# Patient Record
Sex: Female | Born: 1935 | Race: White | Hispanic: No | Smoking: Never smoker
Health system: Southern US, Community
[De-identification: ages and names within clinical notes are randomized; demographics above are authoritative.]

## PROBLEM LIST (undated history)

## (undated) DIAGNOSIS — I10 Essential (primary) hypertension: Secondary | ICD-10-CM

## (undated) DIAGNOSIS — I2510 Atherosclerotic heart disease of native coronary artery without angina pectoris: Secondary | ICD-10-CM

## (undated) DIAGNOSIS — M810 Age-related osteoporosis without current pathological fracture: Secondary | ICD-10-CM

## (undated) DIAGNOSIS — K219 Gastro-esophageal reflux disease without esophagitis: Secondary | ICD-10-CM

## (undated) DIAGNOSIS — I2699 Other pulmonary embolism without acute cor pulmonale: Secondary | ICD-10-CM

## (undated) DIAGNOSIS — E785 Hyperlipidemia, unspecified: Secondary | ICD-10-CM

## (undated) DIAGNOSIS — I4891 Unspecified atrial fibrillation: Secondary | ICD-10-CM

## (undated) DIAGNOSIS — I509 Heart failure, unspecified: Secondary | ICD-10-CM

## (undated) DIAGNOSIS — M109 Gout, unspecified: Secondary | ICD-10-CM

## (undated) DIAGNOSIS — I6529 Occlusion and stenosis of unspecified carotid artery: Secondary | ICD-10-CM

## (undated) DIAGNOSIS — K5790 Diverticulosis of intestine, part unspecified, without perforation or abscess without bleeding: Secondary | ICD-10-CM

## (undated) DIAGNOSIS — K449 Diaphragmatic hernia without obstruction or gangrene: Secondary | ICD-10-CM

## (undated) DIAGNOSIS — D649 Anemia, unspecified: Secondary | ICD-10-CM

## (undated) DIAGNOSIS — N189 Chronic kidney disease, unspecified: Secondary | ICD-10-CM

## (undated) HISTORY — DX: Diaphragmatic hernia without obstruction or gangrene: K44.9

## (undated) HISTORY — DX: Occlusion and stenosis of unspecified carotid artery: I65.29

## (undated) HISTORY — DX: Chronic kidney disease, unspecified: N18.9

## (undated) HISTORY — DX: Anemia, unspecified: D64.9

## (undated) HISTORY — PX: KNEE ARTHROSCOPY: SUR90

## (undated) HISTORY — DX: Other pulmonary embolism without acute cor pulmonale: I26.99

## (undated) HISTORY — PX: ABDOMINAL HYSTERECTOMY: SHX81

## (undated) HISTORY — DX: Hyperlipidemia, unspecified: E78.5

---

## 2003-11-14 ENCOUNTER — Other Ambulatory Visit: Payer: Self-pay

## 2004-02-16 ENCOUNTER — Ambulatory Visit: Payer: Self-pay | Admitting: General Practice

## 2004-03-01 ENCOUNTER — Ambulatory Visit: Payer: Self-pay | Admitting: Family Medicine

## 2004-03-23 ENCOUNTER — Ambulatory Visit: Payer: Self-pay | Admitting: General Practice

## 2004-03-30 ENCOUNTER — Ambulatory Visit: Payer: Self-pay | Admitting: General Practice

## 2004-09-20 ENCOUNTER — Ambulatory Visit: Payer: Self-pay | Admitting: Internal Medicine

## 2005-03-28 ENCOUNTER — Ambulatory Visit: Payer: Self-pay | Admitting: Internal Medicine

## 2006-04-25 ENCOUNTER — Ambulatory Visit: Payer: Self-pay | Admitting: Internal Medicine

## 2006-10-18 ENCOUNTER — Ambulatory Visit: Payer: Self-pay | Admitting: Ophthalmology

## 2006-10-25 ENCOUNTER — Ambulatory Visit: Payer: Self-pay | Admitting: Ophthalmology

## 2007-02-27 ENCOUNTER — Ambulatory Visit: Payer: Self-pay | Admitting: Ophthalmology

## 2007-03-19 ENCOUNTER — Ambulatory Visit: Payer: Self-pay | Admitting: Ophthalmology

## 2007-04-30 ENCOUNTER — Ambulatory Visit: Payer: Self-pay | Admitting: Internal Medicine

## 2008-05-05 ENCOUNTER — Ambulatory Visit: Payer: Self-pay | Admitting: Internal Medicine

## 2009-05-06 ENCOUNTER — Ambulatory Visit: Payer: Self-pay | Admitting: Internal Medicine

## 2009-05-09 HISTORY — PX: ANKLE SURGERY: SHX546

## 2010-04-06 ENCOUNTER — Inpatient Hospital Stay: Payer: Self-pay | Admitting: Specialist

## 2010-04-10 ENCOUNTER — Encounter: Payer: Self-pay | Admitting: Internal Medicine

## 2010-05-09 ENCOUNTER — Encounter: Payer: Self-pay | Admitting: Internal Medicine

## 2010-06-09 ENCOUNTER — Encounter: Payer: Self-pay | Admitting: Internal Medicine

## 2010-07-08 ENCOUNTER — Encounter: Payer: Self-pay | Admitting: Cardiothoracic Surgery

## 2010-08-05 ENCOUNTER — Encounter: Payer: Self-pay | Admitting: Nurse Practitioner

## 2010-08-08 ENCOUNTER — Encounter: Payer: Self-pay | Admitting: Nurse Practitioner

## 2010-08-08 ENCOUNTER — Encounter: Payer: Self-pay | Admitting: Cardiothoracic Surgery

## 2010-09-07 ENCOUNTER — Encounter: Payer: Self-pay | Admitting: Nurse Practitioner

## 2010-09-07 ENCOUNTER — Encounter: Payer: Self-pay | Admitting: Cardiothoracic Surgery

## 2010-10-08 ENCOUNTER — Encounter: Payer: Self-pay | Admitting: Nurse Practitioner

## 2010-10-20 ENCOUNTER — Ambulatory Visit: Payer: Self-pay | Admitting: Internal Medicine

## 2010-10-22 ENCOUNTER — Encounter: Payer: Self-pay | Admitting: Cardiothoracic Surgery

## 2010-11-23 ENCOUNTER — Ambulatory Visit: Payer: Self-pay | Admitting: Orthopaedic Surgery

## 2011-03-24 ENCOUNTER — Encounter: Payer: Self-pay | Admitting: Internal Medicine

## 2011-04-09 ENCOUNTER — Encounter: Payer: Self-pay | Admitting: Internal Medicine

## 2011-06-28 DIAGNOSIS — G8929 Other chronic pain: Secondary | ICD-10-CM | POA: Insufficient documentation

## 2013-10-22 DIAGNOSIS — M810 Age-related osteoporosis without current pathological fracture: Secondary | ICD-10-CM | POA: Insufficient documentation

## 2015-06-14 ENCOUNTER — Encounter: Payer: Self-pay | Admitting: Emergency Medicine

## 2015-06-14 ENCOUNTER — Emergency Department
Admission: EM | Admit: 2015-06-14 | Discharge: 2015-06-14 | Disposition: A | Discharge status: Home / Self Care | Payer: Medicare Other | Attending: Student | Admitting: Student

## 2015-06-14 ENCOUNTER — Emergency Department: Payer: Medicare Other

## 2015-06-14 DIAGNOSIS — R112 Nausea with vomiting, unspecified: Secondary | ICD-10-CM | POA: Diagnosis not present

## 2015-06-14 DIAGNOSIS — R197 Diarrhea, unspecified: Secondary | ICD-10-CM | POA: Diagnosis not present

## 2015-06-14 DIAGNOSIS — I10 Essential (primary) hypertension: Secondary | ICD-10-CM | POA: Insufficient documentation

## 2015-06-14 DIAGNOSIS — R140 Abdominal distension (gaseous): Secondary | ICD-10-CM | POA: Insufficient documentation

## 2015-06-14 DIAGNOSIS — N390 Urinary tract infection, site not specified: Secondary | ICD-10-CM | POA: Insufficient documentation

## 2015-06-14 HISTORY — DX: Gastro-esophageal reflux disease without esophagitis: K21.9

## 2015-06-14 HISTORY — DX: Atherosclerotic heart disease of native coronary artery without angina pectoris: I25.10

## 2015-06-14 HISTORY — DX: Essential (primary) hypertension: I10

## 2015-06-14 LAB — COMPREHENSIVE METABOLIC PANEL
ALBUMIN: 4.2 g/dL (ref 3.5–5.0)
ALK PHOS: 72 U/L (ref 38–126)
ALT: 21 U/L (ref 14–54)
ANION GAP: 12 (ref 5–15)
AST: 26 U/L (ref 15–41)
BUN: 22 mg/dL — AB (ref 6–20)
CALCIUM: 9.3 mg/dL (ref 8.9–10.3)
CO2: 28 mmol/L (ref 22–32)
Chloride: 103 mmol/L (ref 101–111)
Creatinine, Ser: 1.03 mg/dL — ABNORMAL HIGH (ref 0.44–1.00)
GFR calc Af Amer: 55 mL/min — ABNORMAL LOW (ref >60)
GFR calc non Af Amer: 47 mL/min — ABNORMAL LOW (ref >60)
GLUCOSE: 100 mg/dL — AB (ref 65–99)
Potassium: 3.6 mmol/L (ref 3.5–5.1)
SODIUM: 143 mmol/L (ref 135–145)
Total Bilirubin: 0.4 mg/dL (ref 0.3–1.2)
Total Protein: 7.7 g/dL (ref 6.5–8.1)

## 2015-06-14 LAB — CBC
HEMATOCRIT: 43.1 % (ref 35.0–47.0)
HEMOGLOBIN: 13.9 g/dL (ref 12.0–16.0)
MCH: 30.4 pg (ref 26.0–34.0)
MCHC: 32.3 g/dL (ref 32.0–36.0)
MCV: 94.1 fL (ref 80.0–100.0)
Platelets: 189 10*3/uL (ref 150–440)
RBC: 4.59 MIL/uL (ref 3.80–5.20)
RDW: 14.5 % (ref 11.5–14.5)
WBC: 14.4 10*3/uL — ABNORMAL HIGH (ref 3.6–11.0)

## 2015-06-14 LAB — URINALYSIS COMPLETE WITH MICROSCOPIC (ARMC ONLY)
Bilirubin Urine: NEGATIVE
Glucose, UA: NEGATIVE mg/dL
Ketones, ur: NEGATIVE mg/dL
Nitrite: NEGATIVE
PH: 7 (ref 5.0–8.0)
Protein, ur: NEGATIVE mg/dL
Specific Gravity, Urine: 1.019 (ref 1.005–1.030)

## 2015-06-14 LAB — LIPASE, BLOOD: Lipase: 23 U/L (ref 11–51)

## 2015-06-14 MED ORDER — CEPHALEXIN 500 MG PO CAPS: 500.0000 mg | ORAL_CAPSULE | Freq: Two times a day (BID) | ORAL | Status: DC

## 2015-06-14 MED ORDER — IOHEXOL 300 MG/ML  SOLN
100.0000 mL | Freq: Once | INTRAMUSCULAR | Status: AC | PRN
  Administered 2015-06-14: 100 mL via INTRAVENOUS

## 2015-06-14 MED ORDER — IOHEXOL 240 MG/ML SOLN
25.0000 mL | Freq: Once | INTRAMUSCULAR | Status: AC | PRN
  Administered 2015-06-14: 25 mL via ORAL

## 2015-06-14 MED ORDER — CEPHALEXIN 500 MG PO CAPS
500.0000 mg | ORAL_CAPSULE | Freq: Once | ORAL | Status: AC
  Administered 2015-06-14: 500 mg via ORAL
  Filled 2015-06-14: qty 1

## 2015-06-14 MED ORDER — ONDANSETRON HCL 4 MG/2ML IJ SOLN
INTRAMUSCULAR | Status: AC
  Administered 2015-06-14: 4 mg via INTRAVENOUS
  Filled 2015-06-14: qty 2

## 2015-06-14 MED ORDER — ONDANSETRON HCL 4 MG/2ML IJ SOLN
4.0000 mg | Freq: Once | INTRAMUSCULAR | Status: AC
  Administered 2015-06-14: 4 mg via INTRAVENOUS
  Filled 2015-06-14: qty 2

## 2015-06-14 MED ORDER — SODIUM CHLORIDE 0.9 % IV BOLUS (SEPSIS)
500.0000 mL | Freq: Once | INTRAVENOUS | Status: AC
  Administered 2015-06-14: 500 mL via INTRAVENOUS

## 2015-06-14 MED ORDER — CEPHALEXIN 500 MG PO CAPS
ORAL_CAPSULE | ORAL | Status: AC
  Filled 2015-06-14: qty 1

## 2015-06-14 NOTE — ED Notes (Signed)
Pt presents to ER via EMS from home with n/v/d that started around 1600.

## 2015-06-14 NOTE — ED Provider Notes (Signed)
[facility] Emergency Department Provider Note  ____________________________________________  Time seen: Approximately 7:17 PM  I have reviewed the triage vital signs and the nursing notes.   HISTORY  Chief Complaint Nausea; Emesis; and Diarrhea    HPI Allison Novak is a 82 y.o. female with history of GERD, hypertension, coronary artery disease who presents for evaluation of sudden onset nausea, vomiting, diarrhea this afternoon, symptoms constant since onset, currently moderate, no modifying factors. She is also complaining of a sense of abdominal distention though she denies pain. No fevers. No chest pain or difficulty breathing. No dysuria. Prior to today, she had been in her usual state of health.   Past Medical History  Diagnosis Date  . GERD (gastroesophageal reflux disease)   . Hypertension   . Coronary artery disease     There are no active problems to display for this patient.   History reviewed. No pertinent past surgical history.  No current outpatient prescriptions on file.  Allergies Macrodantin  History reviewed. No pertinent family history.  Social History Social History  Substance Use Topics  . Smoking status: Never Smoker   . Smokeless tobacco: None  . Alcohol Use: None    Review of Systems Constitutional: No fever/chills Eyes: No visual changes. ENT: No sore throat. Cardiovascular: Denies chest pain. Respiratory: Denies shortness of breath. Gastrointestinal: No abdominal pain.  + nausea, + vomiting.  + diarrhea.  No constipation. Genitourinary: Negative for dysuria. Musculoskeletal: Negative for back pain. Skin: Negative for rash. Neurological: Negative for headaches, focal weakness or numbness.  10-point ROS otherwise negative.  ____________________________________________   PHYSICAL EXAM:  VITAL SIGNS: ED Triage Vitals  Enc Vitals Group     BP 06/14/15 1816 179/84 mmHg     Pulse Rate 06/14/15 1816 90    Resp 06/14/15 1816 18     Temp 06/14/15 1816 98.4 F (36.9 C)     Temp Source 06/14/15 1816 Oral     SpO2 06/14/15 1816 93 %     Weight 06/14/15 1816 165 lb (74.844 kg)     Height 06/14/15 1816 5\' 2"  (1.575 m)     Head Cir --      Peak Flow --      Pain Score --      Pain Loc --      Pain Edu? --      Excl. in [place]? --     Constitutional: Alert and oriented. Well appearing and in no acute distress. Eyes: Conjunctivae are normal. PERRL. EOMI. Head: Atraumatic. Nose: No congestion/rhinnorhea. Mouth/Throat: Mucous membranes are moist.  Oropharynx non-erythematous. Neck: No stridor. Cardiovascular: Normal rate, regular rhythm. Grossly normal heart sounds.  Good peripheral circulation. Respiratory: Normal respiratory effort.  No retractions. Lungs CTAB. Gastrointestinal: Soft and nontender with mild distention and hypoactive bowel sounds. No CVA tenderness. Genitourinary: Deferred Musculoskeletal: No lower extremity tenderness nor edema.  No joint effusions. Neurologic:  Normal speech and language. No gross focal neurologic deficits are appreciated. Skin:  Skin is warm, dry and intact. No rash noted. Psychiatric: Mood and affect are normal. Speech and behavior are normal.  ____________________________________________   LABS (all labs ordered are listed, but only abnormal results are displayed)  Labs Reviewed  COMPREHENSIVE METABOLIC PANEL - Abnormal; Notable for the following:    Glucose, Bld 100 (*)    BUN 22 (*)    Creatinine, Ser 1.03 (*)    GFR calc non Af Amer 47 (*)    GFR calc Af Amer 55 (*)  All other components within normal limits  CBC - Abnormal; Notable for the following:    WBC 14.4 (*)    All other components within normal limits  URINALYSIS COMPLETEWITH MICROSCOPIC ([facility] ONLY) - Abnormal; Notable for the following:    Color, Urine AMBER (*)    APPearance TURBID (*)    Hgb urine dipstick 2+ (*)    Leukocytes, UA 3+ (*)    Bacteria, UA MANY (*)     Squamous Epithelial / LPF 0-5 (*)    All other components within normal limits  URINE CULTURE  LIPASE, BLOOD   ____________________________________________  EKG  ED ECG REPORT I, Joanne Gavel, the attending physician, personally viewed and interpreted this ECG.   Date: 06/14/2015  EKG Time: 18:24  Rate: 88  Rhythm: normal sinus rhythm  Axis: normal  Intervals:none  ST&T Change: No acute ST elevation. EKG unchanged from 04/06/2010.  ____________________________________________  RADIOLOGY  CT abdomen and pelvis IMPRESSION: Sigmoid diverticulosis. No evidence of bowel obstruction or inflammation. Normal appendix.  Moderate size hiatal hernia.  Probable small gallstones.  ____________________________________________   PROCEDURES  Procedure(s) performed: None  Critical Care performed: No  ____________________________________________   INITIAL IMPRESSION / ASSESSMENT AND PLAN / ED COURSE  Pertinent labs & imaging results that were available during my care of the patient were reviewed by me and considered in my medical decision making (see chart for details).  Allison Novak is a 82 y.o. female with history of GERD, hypertension, coronary artery disease who presents for evaluation of sudden onset nausea, vomiting, diarrhea this afternoon. On exam, she is nontoxic appearing and in no acute distress. I'll signs are stable, she is afebrile. She has soft abdomen with mild distention which is nontender. Plan for screening labs, CT of the abdomen and pelvis, urinalysis. We'll give IV fluids as well as antiemetics. Reassess for disposition.  ----------------------------------------- 8:53 PM on 06/14/2015 -----------------------------------------  Patient reports she feels much better at this time. She is tolerating by mouth intake. CT of the abdomen and pelvis shows no acute intra-abdominal or intra-pelvic process. Urinalysis is concerning for urinary tract infection.  We'll discharge with Keflex. Additionally, she is moderately hypertensive here with a systolic blood pressure in the 170s. I discussed with her that she will need to follow up with her primary care doctor for reevaluation and for BP re-check within the next 1-2 days. We discussed return precautions and she is comfortable with the discharge plan. DC home. ____________________________________________   FINAL CLINICAL IMPRESSION(S) / ED DIAGNOSES  Final diagnoses:  Nausea vomiting and diarrhea  UTI (lower urinary tract infection)      Joanne Gavel, MD 06/14/15 2054

## 2015-06-16 DIAGNOSIS — I700 Atherosclerosis of aorta: Secondary | ICD-10-CM | POA: Diagnosis present

## 2015-06-16 DIAGNOSIS — I10 Essential (primary) hypertension: Secondary | ICD-10-CM | POA: Diagnosis present

## 2015-06-16 DIAGNOSIS — K5730 Diverticulosis of large intestine without perforation or abscess without bleeding: Secondary | ICD-10-CM | POA: Insufficient documentation

## 2015-06-16 LAB — URINE CULTURE

## 2015-06-23 ENCOUNTER — Encounter: Payer: Self-pay | Admitting: Internal Medicine

## 2015-06-23 ENCOUNTER — Observation Stay
Admission: AD | Admit: 2015-06-23 | Discharge: 2015-06-24 | Disposition: A | Discharge status: Home / Self Care | Payer: Medicare Other | Source: Ambulatory Visit | Attending: Internal Medicine | Admitting: Internal Medicine

## 2015-06-23 DIAGNOSIS — K5730 Diverticulosis of large intestine without perforation or abscess without bleeding: Secondary | ICD-10-CM | POA: Insufficient documentation

## 2015-06-23 DIAGNOSIS — I2510 Atherosclerotic heart disease of native coronary artery without angina pectoris: Secondary | ICD-10-CM | POA: Insufficient documentation

## 2015-06-23 DIAGNOSIS — M109 Gout, unspecified: Secondary | ICD-10-CM | POA: Insufficient documentation

## 2015-06-23 DIAGNOSIS — Z881 Allergy status to other antibiotic agents status: Secondary | ICD-10-CM | POA: Insufficient documentation

## 2015-06-23 DIAGNOSIS — R079 Chest pain, unspecified: Principal | ICD-10-CM | POA: Diagnosis present

## 2015-06-23 DIAGNOSIS — K8020 Calculus of gallbladder without cholecystitis without obstruction: Secondary | ICD-10-CM | POA: Insufficient documentation

## 2015-06-23 DIAGNOSIS — J9811 Atelectasis: Secondary | ICD-10-CM | POA: Insufficient documentation

## 2015-06-23 DIAGNOSIS — R112 Nausea with vomiting, unspecified: Secondary | ICD-10-CM | POA: Insufficient documentation

## 2015-06-23 DIAGNOSIS — K449 Diaphragmatic hernia without obstruction or gangrene: Secondary | ICD-10-CM | POA: Insufficient documentation

## 2015-06-23 DIAGNOSIS — Z8249 Family history of ischemic heart disease and other diseases of the circulatory system: Secondary | ICD-10-CM | POA: Insufficient documentation

## 2015-06-23 DIAGNOSIS — N390 Urinary tract infection, site not specified: Secondary | ICD-10-CM | POA: Diagnosis not present

## 2015-06-23 DIAGNOSIS — R140 Abdominal distension (gaseous): Secondary | ICD-10-CM | POA: Diagnosis not present

## 2015-06-23 DIAGNOSIS — K219 Gastro-esophageal reflux disease without esophagitis: Secondary | ICD-10-CM | POA: Insufficient documentation

## 2015-06-23 DIAGNOSIS — I10 Essential (primary) hypertension: Secondary | ICD-10-CM | POA: Diagnosis not present

## 2015-06-23 DIAGNOSIS — I4891 Unspecified atrial fibrillation: Secondary | ICD-10-CM | POA: Insufficient documentation

## 2015-06-23 DIAGNOSIS — M810 Age-related osteoporosis without current pathological fracture: Secondary | ICD-10-CM | POA: Insufficient documentation

## 2015-06-23 HISTORY — DX: Diverticulosis of intestine, part unspecified, without perforation or abscess without bleeding: K57.90

## 2015-06-23 HISTORY — DX: Unspecified atrial fibrillation: I48.91

## 2015-06-23 HISTORY — DX: Gout, unspecified: M10.9

## 2015-06-23 HISTORY — DX: Age-related osteoporosis without current pathological fracture: M81.0

## 2015-06-23 LAB — COMPREHENSIVE METABOLIC PANEL
ALT: 22 U/L (ref 14–54)
AST: 33 U/L (ref 15–41)
Albumin: 4.6 g/dL (ref 3.5–5.0)
Alkaline Phosphatase: 87 U/L (ref 38–126)
Anion gap: 9 (ref 5–15)
BUN: 10 mg/dL (ref 6–20)
CHLORIDE: 97 mmol/L — AB (ref 101–111)
CO2: 26 mmol/L (ref 22–32)
CREATININE: 0.91 mg/dL (ref 0.44–1.00)
Calcium: 9.2 mg/dL (ref 8.9–10.3)
GFR calc Af Amer: >60 mL/min (ref >60)
GFR, EST NON AFRICAN AMERICAN: 55 mL/min — AB (ref >60)
Glucose, Bld: 90 mg/dL (ref 65–99)
Potassium: 4 mmol/L (ref 3.5–5.1)
Sodium: 132 mmol/L — ABNORMAL LOW (ref 135–145)
Total Bilirubin: 0.7 mg/dL (ref 0.3–1.2)
Total Protein: 8 g/dL (ref 6.5–8.1)

## 2015-06-23 LAB — URINALYSIS COMPLETE WITH MICROSCOPIC (ARMC ONLY)
BACTERIA UA: NONE SEEN
BILIRUBIN URINE: NEGATIVE
GLUCOSE, UA: NEGATIVE mg/dL
Leukocytes, UA: NEGATIVE
NITRITE: NEGATIVE
PH: 7 (ref 5.0–8.0)
Protein, ur: NEGATIVE mg/dL
RBC / HPF: NONE SEEN RBC/hpf (ref 0–5)
Specific Gravity, Urine: 1.004 — ABNORMAL LOW (ref 1.005–1.030)
Squamous Epithelial / LPF: NONE SEEN

## 2015-06-23 LAB — CBC
HEMATOCRIT: 44.2 % (ref 35.0–47.0)
Hemoglobin: 14.8 g/dL (ref 12.0–16.0)
MCH: 30.3 pg (ref 26.0–34.0)
MCHC: 33.5 g/dL (ref 32.0–36.0)
MCV: 90.6 fL (ref 80.0–100.0)
Platelets: 230 10*3/uL (ref 150–440)
RBC: 4.88 MIL/uL (ref 3.80–5.20)
RDW: 14.4 % (ref 11.5–14.5)
WBC: 5.7 10*3/uL (ref 3.6–11.0)

## 2015-06-23 LAB — MAGNESIUM: Magnesium: 2.1 mg/dL (ref 1.7–2.4)

## 2015-06-23 LAB — TROPONIN I: Troponin I: <0.03 ng/mL (ref <0.031)

## 2015-06-23 MED ORDER — HYDRALAZINE HCL 20 MG/ML IJ SOLN: 10.0000 mg | Freq: Four times a day (QID) | INTRAMUSCULAR | Status: DC | PRN

## 2015-06-23 MED ORDER — METOPROLOL SUCCINATE ER 25 MG PO TB24: 25.0000 mg | ORAL_TABLET | Freq: Every day | ORAL | Status: DC

## 2015-06-23 MED ORDER — POLYETHYLENE GLYCOL 3350 17 G PO PACK: 17.0000 g | PACK | Freq: Every day | ORAL | Status: DC | PRN

## 2015-06-23 MED ORDER — ENOXAPARIN SODIUM 40 MG/0.4ML ~~LOC~~ SOLN
40.0000 mg | SUBCUTANEOUS | Status: DC
  Administered 2015-06-23: 40 mg via SUBCUTANEOUS
  Filled 2015-06-23: qty 0.4

## 2015-06-23 MED ORDER — METOPROLOL SUCCINATE ER 25 MG PO TB24
25.0000 mg | ORAL_TABLET | Freq: Every day | ORAL | Status: DC
  Administered 2015-06-23 – 2015-06-24 (×2): 25 mg via ORAL
  Filled 2015-06-23 (×2): qty 1

## 2015-06-23 MED ORDER — ONDANSETRON HCL 4 MG PO TABS: 4.0000 mg | ORAL_TABLET | Freq: Four times a day (QID) | ORAL | Status: DC | PRN

## 2015-06-23 MED ORDER — ASPIRIN EC 81 MG PO TBEC
81.0000 mg | DELAYED_RELEASE_TABLET | Freq: Every day | ORAL | Status: DC
  Administered 2015-06-24: 81 mg via ORAL
  Filled 2015-06-23: qty 1

## 2015-06-23 MED ORDER — LOSARTAN POTASSIUM 50 MG PO TABS
100.0000 mg | ORAL_TABLET | Freq: Every day | ORAL | Status: DC
  Administered 2015-06-23 – 2015-06-24 (×2): 100 mg via ORAL
  Filled 2015-06-23 (×2): qty 2

## 2015-06-23 MED ORDER — SIMVASTATIN 40 MG PO TABS
80.0000 mg | ORAL_TABLET | Freq: Every day | ORAL | Status: DC
  Administered 2015-06-23: 80 mg via ORAL
  Filled 2015-06-23: qty 2

## 2015-06-23 MED ORDER — OXYBUTYNIN CHLORIDE ER 5 MG PO TB24
5.0000 mg | ORAL_TABLET | Freq: Every day | ORAL | Status: DC
  Administered 2015-06-23: 5 mg via ORAL
  Filled 2015-06-23 (×2): qty 1

## 2015-06-23 MED ORDER — SODIUM CHLORIDE 0.9% FLUSH: 3.0000 mL | INTRAVENOUS | Status: DC | PRN

## 2015-06-23 MED ORDER — ACETAMINOPHEN 650 MG RE SUPP: 650.0000 mg | Freq: Four times a day (QID) | RECTAL | Status: DC | PRN

## 2015-06-23 MED ORDER — SODIUM CHLORIDE 0.9% FLUSH: 3.0000 mL | Freq: Two times a day (BID) | INTRAVENOUS | Status: DC

## 2015-06-23 MED ORDER — SODIUM CHLORIDE 0.9 % IV SOLN: 250.0000 mL | INTRAVENOUS | Status: DC | PRN

## 2015-06-23 MED ORDER — ACETAMINOPHEN 325 MG PO TABS
650.0000 mg | ORAL_TABLET | Freq: Four times a day (QID) | ORAL | Status: DC | PRN
  Administered 2015-06-23: 650 mg via ORAL
  Filled 2015-06-23: qty 2

## 2015-06-23 MED ORDER — ONDANSETRON HCL 4 MG/2ML IJ SOLN: 4.0000 mg | Freq: Four times a day (QID) | INTRAMUSCULAR | Status: DC | PRN

## 2015-06-23 NOTE — H&P (Signed)
[place] [facility] [place] NAME: Allison Novak    MR#:  [id number]  DATE OF BIRTH:  [birth year]  DATE OF ADMISSION:  06/23/2015  PRIMARY CARE PHYSICIAN: Tama High III, MD   REQUESTING/REFERRING PHYSICIAN: Dr. Caryl Comes  CHIEF COMPLAINT:  No chief complaint on file.  Chest pain  HISTORY OF PRESENT ILLNESS:  Allison Novak  is a 82 y.o. female with a known history of hypertension, CAD, atrial fibrillation presented to the hospital as a direct admit from her primary care physician Dr. Danie Chandler office. Patient has had elevated blood pressure over the last 3-4 days. As high as systolic more than [id number]. Today while going to her doctor's office patient also developed some left lower chest pain. Lasted about 30 minutes and was nonexertional. No aggravating or relieving factors. Resolved on its own without any nitroglycerin or other medications. She did not have any nausea or vomiting or shortness of breath or palpitations or syncope.  PAST MEDICAL HISTORY:   Past Medical History  Diagnosis Date  . GERD (gastroesophageal reflux disease)   . Hypertension   . Coronary artery disease   . Diverticulosis   . A-fib ([place])   . Gout   . Osteoporosis     PAST SURGICAL HISTORY:  History reviewed. No pertinent past surgical history.  SOCIAL HISTORY:   Social History  Substance Use Topics  . Smoking status: Never Smoker   . Smokeless tobacco: Not on file  . Alcohol Use: Not on file    FAMILY HISTORY:   Family History  Problem Relation Age of Onset  . CAD Mother   . Hypertension Sister     DRUG ALLERGIES:   Allergies  Allergen Reactions  . Keflex [Cephalexin] Other (See Comments)    Pt cant remember exact reaction  . Macrodantin [Nitrofurantoin Macrocrystal] Other (See Comments)    REVIEW OF SYSTEMS:   Review of Systems  Constitutional: Positive for malaise/fatigue. Negative for fever, chills and weight loss.  HENT: Negative for  hearing loss and nosebleeds.   Eyes: Negative for blurred vision, double vision and pain.  Respiratory: Negative for cough, hemoptysis, sputum production, shortness of breath and wheezing.   Cardiovascular: Positive for chest pain. Negative for palpitations, orthopnea and leg swelling.  Gastrointestinal: Negative for nausea, vomiting, abdominal pain, diarrhea and constipation.  Genitourinary: Negative for dysuria and hematuria.  Musculoskeletal: Negative for myalgias, back pain and falls.  Skin: Negative for rash.  Neurological: Negative for dizziness, tremors, sensory change, speech change, focal weakness, seizures and headaches.  Endo/Heme/Allergies: Does not bruise/bleed easily.  Psychiatric/Behavioral: Negative for depression and memory loss. The patient is not nervous/anxious.     MEDICATIONS AT HOME:   Prior to Admission medications   Not on File      VITAL SIGNS:  Blood pressure 160/84, pulse 80, temperature 97.7 F (36.5 C), temperature source Oral, resp. rate 18, height 5\' 1"  (1.549 m), weight 71.714 kg (158 lb 1.6 oz), SpO2 99 %.  PHYSICAL EXAMINATION:  Physical Exam  GENERAL:  82 y.o.-year-old patient lying in the bed with no acute distress.  EYES:  No scleral icterus. Extraocular muscles intact.  HEENT: Head atraumatic, normocephalic. Oropharynx and nasopharynx clear. No oropharyngeal erythema, moist oral mucosa  NECK:  Supple, no jugular venous distention. No thyroid enlargement, no tenderness.  LUNGS: Normal breath sounds bilaterally, no wheezing, rales, rhonchi. No use of accessory muscles of respiration.  CARDIOVASCULAR: S1, S2 normal. No murmurs, rubs, or gallops.  ABDOMEN:  Soft, nontender, nondistended. Bowel sounds present. No organomegaly or mass.  EXTREMITIES: No pedal edema, cyanosis, or clubbing. + 2 pedal & radial pulses b/l.   NEUROLOGIC: Cranial nerves II through XII are intact. No focal Motor or sensory deficits appreciated b/l PSYCHIATRIC: The patient  is alert and oriented x 3. Good affect.  SKIN: No obvious rash, lesion, or ulcer.   LABORATORY PANEL:   CBC No results for input(s): WBC, HGB, HCT, PLT in the last 168 hours. ------------------------------------------------------------------------------------------------------------------  Chemistries  No results for input(s): NA, K, CL, CO2, GLUCOSE, BUN, CREATININE, CALCIUM, MG, AST, ALT, ALKPHOS, BILITOT in the last 168 hours.  Invalid input(s): GFRCGP ------------------------------------------------------------------------------------------------------------------  Cardiac Enzymes No results for input(s): TROPONINI in the last 168 hours. ------------------------------------------------------------------------------------------------------------------  RADIOLOGY:  No results found.   IMPRESSION AND PLAN:   * Atypical left-sided chest pain likely from accelerated hypertension. We'll check an EKG and serial troponin. Start aspirin. Continue beta blocker. Nitroglycerin when necessary. We'll schedule for a stress test in the morning.  * Accelerated hypertension Increased dose of losartan 200 mg. Continue Toprol-XL 25 mg. Will use IV hydralazine when necessary.  * Recent urinary tract infection Repeat UA.  * GERD  * DVT prophylaxis with Lovenox   All the records are reviewed and case discussed with ED provider. Management plans discussed with the patient, family and they are in agreement.  CODE STATUS: FULL  TOTAL TIME TAKING CARE OF THIS PATIENT: 40 minutes.    Hillary Bow R M.D on 06/23/2015 at 5:47 PM  Between 7am to 6pm - Pager - [phone]  After 6pm go to [email or web address] - password [email or web address] [facility]  Office  [phone]  CC: Primary care physician; Adin Hector, MD     Note: This dictation was prepared with Dragon dictation along with smaller phrase technology. Any transcriptional errors that result from this process  are unintentional.

## 2015-06-24 ENCOUNTER — Encounter: Payer: Self-pay | Admitting: Radiology

## 2015-06-24 ENCOUNTER — Observation Stay: Payer: Medicare Other

## 2015-06-24 DIAGNOSIS — R079 Chest pain, unspecified: Secondary | ICD-10-CM | POA: Diagnosis not present

## 2015-06-24 LAB — NM MYOCAR MULTI W/SPECT W/WALL MOTION / EF
CHL CUP NUCLEAR SDS: 0
CHL CUP NUCLEAR SRS: 1
CHL CUP NUCLEAR SSS: 0
CHL CUP RESTING HR STRESS: 70 {beats}/min
CHL CUP STRESS STAGE 2 GRADE: 0 %
CHL CUP STRESS STAGE 2 HR: 71 {beats}/min
CHL CUP STRESS STAGE 2 SPEED: 0 mph
CHL CUP STRESS STAGE 3 GRADE: 0 %
CHL CUP STRESS STAGE 5 GRADE: 0 %
CHL CUP STRESS STAGE 5 HR: 110 {beats}/min
CHL CUP STRESS STAGE 6 DBP: 74 mmHg
CHL CUP STRESS STAGE 6 GRADE: 0 %
CHL CUP STRESS STAGE 6 HR: 97 {beats}/min
CHL CUP STRESS STAGE 6 SPEED: 0 mph
CSEPED: 1 min
CSEPEDS: 12 s
CSEPEW: 1 METS
CSEPPMHR: 80 %
LV dias vol: 35 mL
LV sys vol: 9 mL
Peak HR: 107 {beats}/min
Stage 1 HR: 73 {beats}/min
Stage 3 HR: 71 {beats}/min
Stage 3 Speed: 0 mph
Stage 4 Grade: 0 %
Stage 4 HR: 107 {beats}/min
Stage 4 Speed: 0 mph
Stage 5 DBP: 63 mmHg
Stage 5 SBP: 141 mmHg
Stage 5 Speed: 0 mph
Stage 6 SBP: 146 mmHg
TID: 0.9

## 2015-06-24 LAB — TROPONIN I: Troponin I: 0.04 ng/mL — ABNORMAL HIGH (ref <0.031)

## 2015-06-24 MED ORDER — TECHNETIUM TC 99M SESTAMIBI - CARDIOLITE
12.8400 | Freq: Once | INTRAVENOUS | Status: AC | PRN
  Administered 2015-06-24: 12.84 via INTRAVENOUS

## 2015-06-24 MED ORDER — METOPROLOL SUCCINATE ER 25 MG PO TB24: 25.0000 mg | ORAL_TABLET | Freq: Two times a day (BID) | ORAL | Status: DC

## 2015-06-24 MED ORDER — REGADENOSON 0.4 MG/5ML IV SOLN
0.4000 mg | Freq: Once | INTRAVENOUS | Status: AC
  Administered 2015-06-24: 0.4 mg via INTRAVENOUS

## 2015-06-24 MED ORDER — TECHNETIUM TC 99M SESTAMIBI - CARDIOLITE
30.0000 | Freq: Once | INTRAVENOUS | Status: AC | PRN
  Administered 2015-06-24: 12:00:00 29.331 via INTRAVENOUS

## 2015-06-24 MED ORDER — ASPIRIN 81 MG PO TBEC: 81.0000 mg | DELAYED_RELEASE_TABLET | Freq: Every day | ORAL | Status: DC

## 2015-06-24 MED ORDER — LOSARTAN POTASSIUM 100 MG PO TABS: 100.0000 mg | ORAL_TABLET | Freq: Every day | ORAL | Status: DC

## 2015-06-24 NOTE — Progress Notes (Signed)
Patient's second troponin 0.04. Notified MD and no new orders received per Dr. Jannifer Franklin. Nursing staff will continue to monitor. Earleen Reaper, RN

## 2015-06-24 NOTE — Progress Notes (Signed)
Discharge instructions given to patient and daughter. Stress test was negative. Medications electronically sent to [facility]. Education given on blood pressure and medications as well as stress test after care. No questions at this time. Will follow up with PCP. IV and tele removed. Patient finishing eating dinner then will discharge.

## 2015-06-24 NOTE — Progress Notes (Signed)
Stress test was performed. Will be read by [facility] cardiology due to patient being Allison Novak patient. Vitals stable, given daily medications that she missed this morning. Patient was complaining of some nausea when she first came back from the test, now has resolved. Currently drinking ginger ale. Will continue to monitor and await stress test results.

## 2015-06-24 NOTE — Progress Notes (Signed)
Nuclear medicine called this AM to inform primary RN that Dr. Humphrey Rolls had cancelled the stress test due to patient's troponin coming back at 0.04. Dr. Posey Pronto paged and MD stated to put in a cardiology consult and let Dr. Humphrey Rolls see the patient. Dr. Humphrey Rolls paged and stated he needs to see the patient before they decide stress test or cardiac catheterization. MD stated to control blood pressure for now and let the patient eat, then he will come and see her. Patient denies chest pain at this time.

## 2015-06-24 NOTE — Discharge Summary (Signed)
[name], 82 y.o., DOB Jul 06, 1925, MRN [id number]. Admission date: 06/23/2015 Discharge Date 06/24/2015 Primary MD Allison Odetta Pink, MD Admitting Physician Allison Bow, MD  Admission Diagnosis  chest pain with accelerated hypertension  Discharge Diagnosis   Active Problems:   Chest pain   Accelerated hypertension  GERD Hypertension Diverticulosis Atrial fibrillation Gout Osteoporosis        Hospital Course HISTORY OF PRESENT ILLNESS:  Allison Novak is a 82 y.o. female with a known history of hypertension, CAD, atrial fibrillation presented to the hospital as a direct admit from her primary care physician Dr. Danie Novak office. Patient has had elevated blood pressure over the last 3-4 days. As high as systolic more than [id number].    Patient was seen in the ED and admitted. Serial cardiac enzyme showed troponin as high as 0.04. With her blood pressure normalizing her chest pain resolved. She is awaiting a stress test if the stress test shows no ischemia then she'll be discharged home. She is currently asymptomatic.             Consults  cardiology  Significant Tests:  See full reports for all details      Ct Abdomen Pelvis W Contrast  06/14/2015  CLINICAL DATA:  82 year old female with abdominal distention, nausea and vomiting. EXAM: CT ABDOMEN AND PELVIS WITH CONTRAST TECHNIQUE: Multidetector CT imaging of the abdomen and pelvis was performed using the standard protocol following bolus administration of intravenous contrast. CONTRAST:  199mL OMNIPAQUE IOHEXOL 300 MG/ML  SOLN COMPARISON:  None. FINDINGS: Evaluation of this exam is limited due to respiratory motion artifact. Bibasilar linear atelectasis/ scarring. No intra-abdominal free air or free fluid. Multiple small hepatic hypodensities measuring up to 1.2 cm in the left lobe of the liver are not well characterized but may represent hemangioma or cysts. MRI may provide better characterization if clinically indicated.  Small layering stones noted within the gallbladder. No pericholecystic fluid or signs of acute inflammatory changes. The spleen, pancreas, adrenal glands appear unremarkable. Left adrenal cortical irregularity/scarring. There is no hydronephrosis on either side. The visualized ureters and urinary bladder appear unremarkable. Hysterectomy. There is a moderate size hiatal hernia. There is extensive sigmoid diverticulosis without active inflammatory changes. Small amount of high attenuating material throughout the colon likely represents oral contrast and less likely related to diverticular bleed. Correlation with clinical exam recommended. No evidence of bowel obstruction. Normal appendix. There is aortoiliac atherosclerotic disease. No portal venous gas. No adenopathy. The abdominal wall soft tissues appear unremarkable. Diffuse degenerative changes of the spine. No acute fracture. IMPRESSION: Sigmoid diverticulosis. No evidence of bowel obstruction or inflammation. Normal appendix. Moderate size hiatal hernia. Probable small gallstones. Electronically Signed   By: Allison Novak M.D.   On: 06/14/2015 20:15       Today   Subjective:   [place] well no cp  Objective:   Blood pressure 156/76, pulse 76, temperature 98.3 F (36.8 C), temperature source Oral, resp. rate 18, height 5\' 1"  (1.549 m), weight 71.305 kg (157 lb 3.2 oz), SpO2 95 %.  .  Intake/Output Summary (Last 24 hours) at 06/24/15 1356 Last data filed at 06/24/15 0920  Gross per 24 hour  Intake      0 ml  Output   1800 ml  Net  -1800 ml    Exam VITAL SIGNS: Blood pressure 156/76, pulse 76, temperature 98.3 F (36.8 C), temperature source Oral, resp. rate 18, height 5\' 1"  (1.549 m), weight 71.305 kg (157 lb  3.2 oz), SpO2 95 %.  GENERAL:  83 y.o.-year-old patient lying in the bed with no acute distress.  EYES: Pupils equal, round, reactive to light and accommodation. No scleral icterus. Extraocular muscles intact.   HEENT: Head atraumatic, normocephalic. Oropharynx and nasopharynx clear.  NECK:  Supple, no jugular venous distention. No thyroid enlargement, no tenderness.  LUNGS: Normal breath sounds bilaterally, no wheezing, rales,rhonchi or crepitation. No use of accessory muscles of respiration.  CARDIOVASCULAR: S1, S2 normal. No murmurs, rubs, or gallops.  ABDOMEN: Soft, nontender, nondistended. Bowel sounds present. No organomegaly or mass.  EXTREMITIES: No pedal edema, cyanosis, or clubbing.  NEUROLOGIC: Cranial nerves II through XII are intact. Muscle strength 5/5 in all extremities. Sensation intact. Gait not checked.  PSYCHIATRIC: The patient is alert and oriented x 3.  SKIN: No obvious rash, lesion, or ulcer.   Data Review     CBC w Diff: Lab Results  Component Value Date   WBC 5.7 06/23/2015   HGB 14.8 06/23/2015   HCT 44.2 06/23/2015   PLT 230 06/23/2015   CMP: Lab Results  Component Value Date   NA 132* 06/23/2015   K 4.0 06/23/2015   CL 97* 06/23/2015   CO2 26 06/23/2015   BUN 10 06/23/2015   CREATININE 0.91 06/23/2015   PROT 8.0 06/23/2015   ALBUMIN 4.6 06/23/2015   BILITOT 0.7 06/23/2015   ALKPHOS 87 06/23/2015   AST 33 06/23/2015   ALT 22 06/23/2015  .  Micro Results Recent Results (from the past 240 hour(s))  Urine culture     Status: None   Collection Time: 06/14/15  6:29 PM  Result Value Ref Range Status   Specimen Description URINE, CLEAN CATCH  Final   Special Requests NONE  Final   Culture MULTIPLE SPECIES PRESENT, SUGGEST RECOLLECTION  Final   Report Status 06/16/2015 FINAL  Final        Code Status Orders        Start     Ordered   06/23/15 1740  Full code   Continuous     06/23/15 1742    Code Status History    Date Active Date Inactive Code Status Order ID Comments User Context   This patient has a current code status but no historical code status.    Advance Directive Documentation        Most Recent Value   Type of Advance Directive   Healthcare Power of Attorney, Living will   Pre-existing out of facility DNR order (yellow form or [facility] MOST form)     "MOST" Form in Place?            Follow-up Information    Follow up with Allison Allison Burns III, MD In 7 days.   Specialty:  Internal Medicine   Why:  Wednesday, March 1st at 345pm, ccs   Contact information:   [address] [place] [address] [phone]       Discharge Medications     Medication List    TAKE these medications        allopurinol 100 MG tablet  Commonly known as:  ZYLOPRIM  Take 100 mg by mouth daily.     aspirin 81 MG EC tablet  Take 1 tablet (81 mg total) by mouth daily.     losartan 100 MG tablet  Commonly known as:  COZAAR  Take 1 tablet (100 mg total) by mouth daily.     metoprolol succinate 25 MG 24  hr tablet  Commonly known as:  TOPROL-XL  Take 1 tablet (25 mg total) by mouth 2 (two) times daily.     omeprazole 20 MG capsule  Commonly known as:  PRILOSEC  Take 20 mg by mouth daily.     oxybutynin 5 MG tablet  Commonly known as:  DITROPAN  Take 5 mg by mouth at bedtime.     simvastatin 80 MG tablet  Commonly known as:  ZOCOR  Take 80 mg by mouth daily.           Total Time in preparing paper work, data evaluation and todays exam - 35 minutes  Dustin Flock M.D on 06/24/2015 at 1:56 PM  [facility]   Office  [phone]

## 2015-06-24 NOTE — Care Management (Signed)
Patient placed in observation for chest pain and elevated blood pressure. Her second troponin bumped to .04 from .03.  Third troponin .03.  She is independent with her adls and denies issues accessing medical care, obtaining meds or transportation.  There are no medication compliance concerns reported

## 2015-06-24 NOTE — Progress Notes (Signed)
Patient rested quietly tonight with one complaint of back pain treated with PRN tylenol. No signs of discomfort or distress noted. A&Ox4 and VSS. NPO since midnight for stress test scheduled for today. Nursing staff will continue to monitor. Earleen Reaper, RN

## 2015-06-24 NOTE — Discharge Instructions (Signed)

## 2015-06-25 LAB — URINE CULTURE: Culture: 1000

## 2015-06-30 DIAGNOSIS — K8020 Calculus of gallbladder without cholecystitis without obstruction: Secondary | ICD-10-CM | POA: Insufficient documentation

## 2015-08-10 DIAGNOSIS — Z85828 Personal history of other malignant neoplasm of skin: Secondary | ICD-10-CM | POA: Insufficient documentation

## 2017-02-18 ENCOUNTER — Emergency Department: Payer: Medicare Other

## 2017-02-18 ENCOUNTER — Encounter: Payer: Self-pay | Admitting: Emergency Medicine

## 2017-02-18 ENCOUNTER — Observation Stay
Admission: EM | Admit: 2017-02-18 | Discharge: 2017-02-19 | Disposition: A | Discharge status: Home / Self Care | Payer: Medicare Other | Attending: Internal Medicine | Admitting: Internal Medicine

## 2017-02-18 DIAGNOSIS — Z8249 Family history of ischemic heart disease and other diseases of the circulatory system: Secondary | ICD-10-CM | POA: Diagnosis not present

## 2017-02-18 DIAGNOSIS — Z7982 Long term (current) use of aspirin: Secondary | ICD-10-CM | POA: Diagnosis not present

## 2017-02-18 DIAGNOSIS — K219 Gastro-esophageal reflux disease without esophagitis: Secondary | ICD-10-CM | POA: Diagnosis not present

## 2017-02-18 DIAGNOSIS — Z79899 Other long term (current) drug therapy: Secondary | ICD-10-CM | POA: Insufficient documentation

## 2017-02-18 DIAGNOSIS — E785 Hyperlipidemia, unspecified: Secondary | ICD-10-CM | POA: Insufficient documentation

## 2017-02-18 DIAGNOSIS — G459 Transient cerebral ischemic attack, unspecified: Secondary | ICD-10-CM | POA: Diagnosis not present

## 2017-02-18 DIAGNOSIS — M109 Gout, unspecified: Secondary | ICD-10-CM | POA: Insufficient documentation

## 2017-02-18 DIAGNOSIS — I2510 Atherosclerotic heart disease of native coronary artery without angina pectoris: Secondary | ICD-10-CM | POA: Diagnosis not present

## 2017-02-18 DIAGNOSIS — I10 Essential (primary) hypertension: Secondary | ICD-10-CM | POA: Diagnosis present

## 2017-02-18 DIAGNOSIS — R2689 Other abnormalities of gait and mobility: Secondary | ICD-10-CM

## 2017-02-18 DIAGNOSIS — R42 Dizziness and giddiness: Secondary | ICD-10-CM | POA: Diagnosis present

## 2017-02-18 DIAGNOSIS — K449 Diaphragmatic hernia without obstruction or gangrene: Secondary | ICD-10-CM

## 2017-02-18 LAB — COMPREHENSIVE METABOLIC PANEL
ALT: 18 U/L (ref 14–54)
ANION GAP: 13 (ref 5–15)
AST: 32 U/L (ref 15–41)
Albumin: 4.4 g/dL (ref 3.5–5.0)
Alkaline Phosphatase: 89 U/L (ref 38–126)
BILIRUBIN TOTAL: 0.7 mg/dL (ref 0.3–1.2)
BUN: 15 mg/dL (ref 6–20)
CO2: 24 mmol/L (ref 22–32)
Calcium: 9.7 mg/dL (ref 8.9–10.3)
Chloride: 102 mmol/L (ref 101–111)
Creatinine, Ser: 1.03 mg/dL — ABNORMAL HIGH (ref 0.44–1.00)
GFR calc Af Amer: 54 mL/min — ABNORMAL LOW (ref >60)
GFR, EST NON AFRICAN AMERICAN: 47 mL/min — AB (ref >60)
Glucose, Bld: 96 mg/dL (ref 65–99)
POTASSIUM: 4.1 mmol/L (ref 3.5–5.1)
Sodium: 139 mmol/L (ref 135–145)
TOTAL PROTEIN: 8.2 g/dL — AB (ref 6.5–8.1)

## 2017-02-18 LAB — URINALYSIS, COMPLETE (UACMP) WITH MICROSCOPIC
Bacteria, UA: NONE SEEN
Bilirubin Urine: NEGATIVE
GLUCOSE, UA: NEGATIVE mg/dL
HGB URINE DIPSTICK: NEGATIVE
Ketones, ur: NEGATIVE mg/dL
LEUKOCYTES UA: NEGATIVE
Nitrite: NEGATIVE
PROTEIN: NEGATIVE mg/dL
SQUAMOUS EPITHELIAL / LPF: NONE SEEN
Specific Gravity, Urine: 1.002 — ABNORMAL LOW (ref 1.005–1.030)
pH: 7 (ref 5.0–8.0)

## 2017-02-18 LAB — CBC
HCT: 41.9 % (ref 35.0–47.0)
HEMOGLOBIN: 13.8 g/dL (ref 12.0–16.0)
MCH: 29.9 pg (ref 26.0–34.0)
MCHC: 32.9 g/dL (ref 32.0–36.0)
MCV: 90.7 fL (ref 80.0–100.0)
PLATELETS: 197 10*3/uL (ref 150–440)
RBC: 4.62 MIL/uL (ref 3.80–5.20)
RDW: 14.5 % (ref 11.5–14.5)
WBC: 7.2 10*3/uL (ref 3.6–11.0)

## 2017-02-18 LAB — TROPONIN I: Troponin I: <0.03 ng/mL (ref <0.03)

## 2017-02-18 MED ORDER — ACETAMINOPHEN 325 MG PO TABS
650.0000 mg | ORAL_TABLET | Freq: Four times a day (QID) | ORAL | Status: DC | PRN
  Administered 2017-02-19: 650 mg via ORAL
  Filled 2017-02-18: qty 2

## 2017-02-18 MED ORDER — ACETAMINOPHEN 650 MG RE SUPP: 650.0000 mg | Freq: Four times a day (QID) | RECTAL | Status: DC | PRN

## 2017-02-18 MED ORDER — ALLOPURINOL 100 MG PO TABS
100.0000 mg | ORAL_TABLET | Freq: Every day | ORAL | Status: DC
  Administered 2017-02-19: 100 mg via ORAL
  Filled 2017-02-18: qty 1

## 2017-02-18 MED ORDER — ONDANSETRON HCL 4 MG/2ML IJ SOLN: 4.0000 mg | Freq: Four times a day (QID) | INTRAMUSCULAR | Status: DC | PRN

## 2017-02-18 MED ORDER — HEPARIN SODIUM (PORCINE) 5000 UNIT/ML IJ SOLN
5000.0000 [IU] | Freq: Three times a day (TID) | INTRAMUSCULAR | Status: DC
  Administered 2017-02-18 – 2017-02-19 (×2): 5000 [IU] via SUBCUTANEOUS
  Filled 2017-02-18 (×2): qty 1

## 2017-02-18 MED ORDER — ASPIRIN-DIPYRIDAMOLE ER 25-200 MG PO CP12
1.0000 | ORAL_CAPSULE | Freq: Two times a day (BID) | ORAL | Status: DC
  Administered 2017-02-19: 1 via ORAL
  Filled 2017-02-18 (×3): qty 1

## 2017-02-18 MED ORDER — AMLODIPINE BESYLATE 5 MG PO TABS
5.0000 mg | ORAL_TABLET | Freq: Once | ORAL | Status: AC
  Administered 2017-02-18: 5 mg via ORAL
  Filled 2017-02-18: qty 1

## 2017-02-18 MED ORDER — PANTOPRAZOLE SODIUM 40 MG PO TBEC
40.0000 mg | DELAYED_RELEASE_TABLET | Freq: Every day | ORAL | Status: DC
  Administered 2017-02-19: 40 mg via ORAL
  Filled 2017-02-18: qty 1

## 2017-02-18 MED ORDER — METOPROLOL TARTRATE 5 MG/5ML IV SOLN: 5.0000 mg | INTRAVENOUS | Status: DC | PRN

## 2017-02-18 MED ORDER — ATORVASTATIN CALCIUM 20 MG PO TABS: 40.0000 mg | ORAL_TABLET | Freq: Every day | ORAL | Status: DC

## 2017-02-18 MED ORDER — POLYETHYLENE GLYCOL 3350 17 G PO PACK: 17.0000 g | PACK | Freq: Every day | ORAL | Status: DC | PRN

## 2017-02-18 MED ORDER — ONDANSETRON HCL 4 MG PO TABS: 4.0000 mg | ORAL_TABLET | Freq: Four times a day (QID) | ORAL | Status: DC | PRN

## 2017-02-18 MED ORDER — OXYBUTYNIN CHLORIDE 5 MG PO TABS
5.0000 mg | ORAL_TABLET | Freq: Every day | ORAL | Status: DC
  Administered 2017-02-18: 5 mg via ORAL
  Filled 2017-02-18: qty 1

## 2017-02-18 MED ORDER — AMLODIPINE BESYLATE 5 MG PO TABS
5.0000 mg | ORAL_TABLET | Freq: Every day | ORAL | Status: DC
  Administered 2017-02-19: 5 mg via ORAL
  Filled 2017-02-18: qty 1

## 2017-02-18 MED ORDER — METOPROLOL SUCCINATE ER 50 MG PO TB24
50.0000 mg | ORAL_TABLET | Freq: Every day | ORAL | Status: DC
  Administered 2017-02-19: 50 mg via ORAL
  Filled 2017-02-18: qty 1

## 2017-02-18 MED ORDER — LOSARTAN POTASSIUM 50 MG PO TABS
100.0000 mg | ORAL_TABLET | Freq: Every day | ORAL | Status: DC
  Administered 2017-02-19: 100 mg via ORAL
  Filled 2017-02-18: qty 2

## 2017-02-18 NOTE — Progress Notes (Signed)
Patient arrived to 2A Room 243. Patient denies pain and all questions answered. Patient oriented to unit and Fall Safety Plan signed. Skin assessment completed with [facility] and skin intact. A&Ox4, VSS, and NSR on verified tele-box #[id number]. Nursing staff will continue to monitor for any changes in patient status. Earleen Reaper, RN

## 2017-02-18 NOTE — H&P (Signed)
PCP:   Adin Hector, MD   Chief Complaint:  Loss of balance  HPI: This is a 83 year old female who woke this morning from that she was dizzy and off-balance. She had to use the wall for support as she went to the bathroom. She checked her blood pressure and it was 203/100. At baseline she states her blood pressure is normal. She denies any other symptomatology, she denies any headaches, nausea, vomiting or diarrhea. She denies any facial drooping, slurred speech, altered mentation, headaches or dysphasia. She also denies any localized weakness. She states his been no change in her medication and she's been compliant with her medication. She takes a baby aspirin daily. She decided to come to ER. In the ER the patient's systolic blood pressure was 215. Her dizziness had resolved. History provided by the patient as well as her son' present at bedside.  Review of Systems:  The patient denies anorexia, fever, weight loss, dizziness, vision loss, decreased hearing, hoarseness, chest pain, syncope, dyspnea on exertion, peripheral edema, balance deficits, hemoptysis, abdominal pain, melena, hematochezia, severe indigestion/heartburn, hematuria, incontinence, genital sores, muscle weakness, suspicious skin lesions, transient blindness, difficulty walking, depression, unusual weight change, abnormal bleeding, enlarged lymph nodes, angioedema, and breast masses.  Past Medical History: Past Medical History:  Diagnosis Date  . A-fib ([place])   . Coronary artery disease   . Diverticulosis   . GERD (gastroesophageal reflux disease)   . Gout   . Hypertension   . Osteoporosis    Past Surgical History:  Procedure Laterality Date  . ABDOMINAL HYSTERECTOMY    . ANKLE SURGERY Left 2011  . KNEE ARTHROSCOPY Right     Medications: Prior to Admission medications   Medication Sig Start Date End Date Taking? Authorizing Provider  allopurinol (ZYLOPRIM) 100 MG tablet Take 100 mg by mouth daily.   Yes [provider]  amLODipine (NORVASC) 2.5 MG tablet Take 2.5 mg by mouth daily. 12/13/16  Yes [provider]  aspirin EC 81 MG EC tablet Take 1 tablet (81 mg total) by mouth daily. 06/24/15  Yes Dustin Flock, MD  atorvastatin (LIPITOR) 40 MG tablet Take 40 mg by mouth daily. 11/18/16  Yes [provider]  losartan (COZAAR) 100 MG tablet Take 1 tablet (100 mg total) by mouth daily. 06/24/15  Yes Dustin Flock, MD  metoprolol succinate (TOPROL-XL) 50 MG 24 hr tablet Take 50 mg by mouth daily. 11/18/16  Yes [provider]  omeprazole (PRILOSEC) 20 MG capsule Take 20 mg by mouth daily.   Yes [provider]  oxybutynin (DITROPAN) 5 MG tablet Take 5 mg by mouth at bedtime.   Yes [provider]  metoprolol succinate (TOPROL-XL) 25 MG 24 hr tablet Take 1 tablet (25 mg total) by mouth 2 (two) times daily. Patient not taking: Reported on 02/18/2017 06/24/15   Dustin Flock, MD    Allergies:   Allergies  Allergen Reactions  . Keflex [Cephalexin] Other (See Comments)    Pt cant remember exact reaction  . Macrodantin [Nitrofurantoin Macrocrystal] Other (See Comments)    Social History:  reports that she has never smoked. She does not have any smokeless tobacco history on file. She reports that she does not drink alcohol or use drugs.  Family History: Family History  Problem Relation Age of Onset  . CAD Mother   . Hypertension Sister     Physical Exam: Vitals:   02/18/17 1226 02/18/17 1234 02/18/17 1530 02/18/17 1825  BP:  (!) 204/75 Marland Kitchen)  181/83 (!) 151/89  Pulse:  70 67 66  Resp:  16 16 16   Temp:      TempSrc:      SpO2:  98% 96% 96%  Weight: 74.8 kg (165 lb)     Height: 5\' 2"  (1.575 m)       General:  Alert and oriented times three, well developed and nourished, no acute distress Eyes: PERRLA, pink conjunctiva, no scleral icterus ENT: Moist oral mucosa, neck supple, no thyromegaly Lungs: clear to ascultation, no wheeze, no crackles, no  use of accessory muscles Cardiovascular: regular rate and rhythm, no regurgitation, no gallops, no murmurs. No carotid bruits, no JVD Abdomen: soft, positive BS, non-tender, non-distended, no organomegaly, not an acute abdomen GU: not examined Neuro: CN II - XII grossly intact, sensation intact Musculoskeletal: strength 5/5 all extremities, no clubbing, cyanosis or edema Skin: no rash, no subcutaneous crepitation, no decubitus Psych: appropriate patient   Labs on Admission:   Recent Labs  02/18/17 1226  NA 139  K 4.1  CL 102  CO2 24  GLUCOSE 96  BUN 15  CREATININE 1.03*  CALCIUM 9.7    Recent Labs  02/18/17 1226  AST 32  ALT 18  ALKPHOS 89  BILITOT 0.7  PROT 8.2*  ALBUMIN 4.4   No results for input(s): LIPASE, AMYLASE in the last 72 hours.  Recent Labs  02/18/17 1226  WBC 7.2  HGB 13.8  HCT 41.9  MCV 90.7  PLT 197    Recent Labs  02/18/17 1226  TROPONINI <0.03   Invalid input(s): POCBNP No results for input(s): DDIMER in the last 72 hours. No results for input(s): HGBA1C in the last 72 hours. No results for input(s): CHOL, HDL, LDLCALC, TRIG, CHOLHDL, LDLDIRECT in the last 72 hours. No results for input(s): TSH, T4TOTAL, T3FREE, THYROIDAB in the last 72 hours.  Invalid input(s): FREET3 No results for input(s): VITAMINB12, FOLATE, FERRITIN, TIBC, IRON, RETICCTPCT in the last 72 hours.  Micro Results: No results found for this or any previous visit (from the past 240 hour(s)).   Radiological Exams on Admission: Ct Head Wo Contrast  Result Date: 02/18/2017 CLINICAL DATA:  83 y/o  F; unsteady gait and dizziness. EXAM: CT HEAD WITHOUT CONTRAST TECHNIQUE: Contiguous axial images were obtained from the base of the skull through the vertex without intravenous contrast. COMPARISON:  None. FINDINGS: Brain: No large acute infarct, hemorrhage, hydrocephalus, extra-axial collection, or focal mass effect. Patchy foci of hypoattenuation in subcortical and  periventricular white matter is compatible with moderate chronic microvascular ischemic changes. Moderate brain parenchymal volume loss. Small chronic infarct within the left superior cerebellar hemisphere. Lucencies in bilateral thalamus consistent with age-indeterminate lacunar infarctions. Vascular: Calcific atherosclerosis of carotid siphons. No hyperdense vessel. Skull: Normal. Negative for fracture or focal lesion. Sinuses/Orbits: No acute finding. Other: None. IMPRESSION: 1. No large acute infarct, hemorrhage, or focal mass effect. 2. Moderate for age chronic microvascular ischemic changes and moderate parenchymal volume loss of the brain. 3. Age-indeterminate lacunar infarcts of bilateral thalami. Small chronic infarct of left superior cerebellar hemisphere. Electronically Signed   By: Kristine Garbe M.D.   On: 02/18/2017 16:41    Assessment/Plan Present on Admission: . TIA (transient ischemic attack) -bring in for overnight observation med telemetry -Change aspirin to aggrenox -lipid panel in AM.  -MRI, MRA, Carotid US -neurocheck, dysphagia screen -PT/OT -statin ordered -EKG ordered  . HTN (hypertension), malignant -home meds resumed -lopressor PRN  . Dyslipidemia -see above   Allison Novak 02/18/2017,  8:59 PM

## 2017-02-18 NOTE — ED Notes (Addendum)
Teleneurology at bedside, md on screen

## 2017-02-18 NOTE — ED Notes (Signed)
Jenna RN, aware of bed assigned  

## 2017-02-18 NOTE — ED Notes (Signed)
Pt reports has some dizziness today, denies currently. Reports believes it has to due with her blood pressure. Was in the 200s at home. Reports takes lopressor and losartan. Her pcp had her stop amlodipine about 1 month ago.

## 2017-02-18 NOTE — ED Notes (Signed)
Pt given meal tray, updated on admission

## 2017-02-18 NOTE — ED Triage Notes (Signed)
States awoke this am dizzy. States is sensation of things moving around her. Smile symmetrical. Grips equal. Denies any pain. Speech clear and coherent.

## 2017-02-18 NOTE — ED Provider Notes (Addendum)
[facility] Emergency Department Provider Note  ____________________________________________   I have reviewed the triage vital signs and the nursing notes.   HISTORY  Chief Complaint Dizziness    HPI [name] is a 83 y.o. female who presents today feeling "better". However this morning she felt like she was moving around the room without moving. She was having balance issues. Patient doesn't history of hypertension she was taken off of some of her hypertension medications, specifically amlodipine 2.5, this was about a month ago, her blood pressure has been "okay" since then usually in the 140s. She did notice swelling her blood pressure was not and it is unclear if she began to feel on steady on her feet before after that realization. She has had no chest pain or short of breath. She did take her amlodipine. She states at this time she has no symptoms and she is feeling better. Symptoms lasted for several hours she is unclear exactly for how long. She felt fine yesterday. She notice symptoms when she woke up this morning at 7:30. She denies headache or stiff neck, no nausea vomiting no focal numbness or weakness    Past Medical History:  Diagnosis Date  . A-fib ([place])   . Coronary artery disease   . Diverticulosis   . GERD (gastroesophageal reflux disease)   . Gout   . Hypertension   . Osteoporosis     Patient Active Problem List   Diagnosis Date Noted  . Chest pain 06/23/2015  . Accelerated hypertension 06/23/2015    Past Surgical History:  Procedure Laterality Date  . ABDOMINAL HYSTERECTOMY    . ANKLE SURGERY Left 2011  . KNEE ARTHROSCOPY Right     Prior to Admission medications   Medication Sig Start Date End Date Taking? Authorizing Provider  allopurinol (ZYLOPRIM) 100 MG tablet Take 100 mg by mouth daily.   Yes [provider]  amLODipine (NORVASC) 2.5 MG tablet Take 2.5 mg by mouth daily. 12/13/16  Yes [provider]   aspirin EC 81 MG EC tablet Take 1 tablet (81 mg total) by mouth daily. 06/24/15  Yes Dustin Flock, MD  atorvastatin (LIPITOR) 40 MG tablet Take 40 mg by mouth daily. 11/18/16  Yes [provider]  losartan (COZAAR) 100 MG tablet Take 1 tablet (100 mg total) by mouth daily. 06/24/15  Yes Dustin Flock, MD  metoprolol succinate (TOPROL-XL) 50 MG 24 hr tablet Take 50 mg by mouth daily. 11/18/16  Yes [provider]  omeprazole (PRILOSEC) 20 MG capsule Take 20 mg by mouth daily.   Yes [provider]  oxybutynin (DITROPAN) 5 MG tablet Take 5 mg by mouth at bedtime.   Yes [provider]  metoprolol succinate (TOPROL-XL) 25 MG 24 hr tablet Take 1 tablet (25 mg total) by mouth 2 (two) times daily. Patient not taking: Reported on 02/18/2017 06/24/15   Dustin Flock, MD    Allergies Keflex [cephalexin] and Macrodantin [nitrofurantoin macrocrystal]  Family History  Problem Relation Age of Onset  . CAD Mother   . Hypertension Sister     Social History Social History  Substance Use Topics  . Smoking status: Never Smoker  . Smokeless tobacco: Not on file  . Alcohol use No    Review of Systems Constitutional: No fever/chills Eyes: No visual changes. ENT: No sore throat. No stiff neck no neck pain Cardiovascular: Denies chest pain. Respiratory: Denies shortness of breath. Gastrointestinal:   no vomiting.  No diarrhea.  No constipation.  Genitourinary: Negative for dysuria. Musculoskeletal: Negative lower extremity swelling Skin: Negative for rash. Neurological: Negative for severe headaches, focal weakness or numbness.   ____________________________________________   PHYSICAL EXAM:  VITAL SIGNS: ED Triage Vitals  Enc Vitals Group     BP 02/18/17 1225 (!) 215/90     Pulse Rate 02/18/17 1225 76     Resp 02/18/17 1225 18     Temp 02/18/17 1225 97.9 F (36.6 C)     Temp Source 02/18/17 1225 Oral     SpO2 02/18/17 1225 97 %     Weight  02/18/17 1226 165 lb (74.8 kg)     Height 02/18/17 1226 5\' 2"  (1.575 m)     Head Circumference --      Peak Flow --      Pain Score --      Pain Loc --      Pain Edu? --      Excl. in [place]? --     Constitutional: Alert and oriented. Well appearing and in no acute distress. Eyes: Conjunctivae are normal Head: Atraumatic HEENT: No congestion/rhinnorhea. Mucous membranes are moist.  Oropharynx non-erythematous Neck:   Nontender with no meningismus, no masses, no stridor Cardiovascular: Normal rate, regular rhythm. Grossly normal heart sounds.  Good peripheral circulation. Respiratory: Normal respiratory effort.  No retractions. Lungs CTAB. Abdominal: Soft and nontender. No distention. No guarding no rebound Back:  There is no focal tenderness or step off.  there is no midline tenderness there are no lesions noted. there is no CVA tenderness Musculoskeletal: No lower extremity tenderness, no upper extremity tenderness. No joint effusions, no DVT signs strong distal pulses no edema Neurologic: Cranial nerves II through XII are grossly intact 5 out of 5 strength bilateral upper and lower extremity. Finger to nose within normal limits heel to shin within normal limits, speech is normal with no word finding difficulty or dysarthria, reflexes symmetric, pupils are equally round and reactive to light, there is no pronator drift, sensation is normal, vision is intact to confrontation, gait is deferred, there is no nystagmus, normal neurologic exam Skin:  Skin is warm, dry and intact. No rash noted. Psychiatric: Mood and affect are somewhat anxious. Speech and behavior are normal.  ____________________________________________   LABS (all labs ordered are listed, but only abnormal results are displayed)  Labs Reviewed  URINALYSIS, COMPLETE (UACMP) WITH MICROSCOPIC - Abnormal; Notable for the following:       Result Value   Color, Urine COLORLESS (*)    APPearance CLEAR (*)    Specific Gravity,  Urine 1.002 (*)    All other components within normal limits  COMPREHENSIVE METABOLIC PANEL - Abnormal; Notable for the following:    Creatinine, Ser 1.03 (*)    Total Protein 8.2 (*)    GFR calc non Af Amer 47 (*)    GFR calc Af Amer 54 (*)    All other components within normal limits  CBC  TROPONIN I    Pertinent labs  results that were available during my care of the patient were reviewed by me and considered in my medical decision making (see chart for details). ____________________________________________  EKG  I personally interpreted any EKGs ordered by me or triage rate 80 68 bpm normal sinus rhythm, nonspecific ST changes, no acute ST elevation or depression normal ____________________________________________  RADIOLOGY  Pertinent labs & imaging results that were available during my care of the patient were reviewed by me and considered in my medical decision making (see  chart for details). If possible, patient and/or family made aware of any abnormal findings. ____________________________________________    PROCEDURES  Procedure(s) performed: None  Procedures  Critical Care performed: None  ____________________________________________   INITIAL IMPRESSION / ASSESSMENT AND PLAN / ED COURSE  Pertinent labs & imaging results that were available during my care of the patient were reviewed by me and considered in my medical decision making (see chart for details).  Patient here with what sounds like cerebellar issue with balance and unsteady gait earlier today, not a candidate for TPA, time of onset unknown unclear if this is actually a stroke. CT scan however does show old cerebellar infarcts. We will reassess her blood pressure although she is asymptomatic at this time, and we will have her discussed with telemetry neurology.  ----------------------------------------- 5:06 PM on 02/18/2017 -----------------------------------------  she remains asymptomatic she  is still quite anxious she and her family member watching the blood pressure monitor carefully and taking notes which I feel is increasing anxiety. Blood pressure remains elevated although again she is asymptomatic. We will consult neurology. My concern is for a cerebellar TIA especially given CT findings. Patient is not aware of any prior CVA    ____________________________________________   FINAL CLINICAL IMPRESSION(S) / ED DIAGNOSES  Final diagnoses:  None      This chart was dictated using voice recognition software.  Despite best efforts to proofread,  errors can occur which can change meaning.      Schuyler Amor, MD 02/18/17 1701    Schuyler Amor, MD 02/18/17 [phone]

## 2017-02-19 ENCOUNTER — Observation Stay: Payer: Medicare Other

## 2017-02-19 DIAGNOSIS — R2689 Other abnormalities of gait and mobility: Secondary | ICD-10-CM

## 2017-02-19 DIAGNOSIS — G459 Transient cerebral ischemic attack, unspecified: Secondary | ICD-10-CM | POA: Diagnosis not present

## 2017-02-19 LAB — LIPID PANEL
CHOLESTEROL: 186 mg/dL (ref 0–200)
HDL: 50 mg/dL (ref >40)
LDL CALC: 112 mg/dL — AB (ref 0–99)
TRIGLYCERIDES: 118 mg/dL (ref <150)
Total CHOL/HDL Ratio: 3.7 RATIO
VLDL: 24 mg/dL (ref 0–40)

## 2017-02-19 LAB — BASIC METABOLIC PANEL
ANION GAP: 12 (ref 5–15)
BUN: 12 mg/dL (ref 6–20)
CALCIUM: 9.5 mg/dL (ref 8.9–10.3)
CO2: 27 mmol/L (ref 22–32)
Chloride: 101 mmol/L (ref 101–111)
Creatinine, Ser: 0.95 mg/dL (ref 0.44–1.00)
GFR, EST AFRICAN AMERICAN: 60 mL/min — AB (ref >60)
GFR, EST NON AFRICAN AMERICAN: 52 mL/min — AB (ref >60)
GLUCOSE: 96 mg/dL (ref 65–99)
POTASSIUM: 3.5 mmol/L (ref 3.5–5.1)
Sodium: 140 mmol/L (ref 135–145)

## 2017-02-19 LAB — CBC
HEMATOCRIT: 41.8 % (ref 35.0–47.0)
HEMOGLOBIN: 13.8 g/dL (ref 12.0–16.0)
MCH: 30.4 pg (ref 26.0–34.0)
MCHC: 33.1 g/dL (ref 32.0–36.0)
MCV: 91.9 fL (ref 80.0–100.0)
Platelets: 180 10*3/uL (ref 150–440)
RBC: 4.55 MIL/uL (ref 3.80–5.20)
RDW: 14.5 % (ref 11.5–14.5)
WBC: 6.4 10*3/uL (ref 3.6–11.0)

## 2017-02-19 NOTE — Evaluation (Signed)
Physical Therapy Evaluation Patient Details Name: Allison Novak MRN: [id number] DOB: 1925/10/23 Today's Date: 02/19/2017   History of Present Illness  83 year old female who woke up and had dizziness and was off-balance. She had to use the wall for support as she went to the bathroom. She checked her blood pressure and it was 203/100.  Pt feeling better and essentially back to baseline.   Clinical Impression  Pt was able to rise to standing and walk X2 around the nurses station safely and w/o issue.  She had no dizziness, no LOBs or unsteadiness and generally feels like she is at her baseline.  Pt and daughter agree that he should be able to manage with the assistance that she has, no further PT needs at this time.     Follow Up Recommendations No PT follow up    Equipment Recommendations       Recommendations for Other Services       Precautions / Restrictions Precautions Precautions: Fall Restrictions Weight Bearing Restrictions: No      Mobility  Bed Mobility Overal bed mobility: Independent             General bed mobility comments: Pt is able to rise to sitting w/o assist  Transfers Overall transfer level: Independent Equipment used: Straight cane             General transfer comment: Pt is able to rise to standing w/o assist  Ambulation/Gait Ambulation/Gait assistance: Supervision Ambulation Distance (Feet): 350 Feet Assistive device: Straight cane       General Gait Details: Pt walked with good confidence, cadence and near her baseline speed.  She was not reliant on the cane and generally did well.    Stairs            Wheelchair Mobility    Modified Rankin (Stroke Patients Only)       Balance Overall balance assessment: Independent                                           Pertinent Vitals/Pain Pain Assessment: No/denies pain    Home Living Family/patient expects to be discharged to:: Private  residence Living Arrangements: Alone Available Help at Discharge: Family Type of Home: House Home Access: [place]: Kasandra Knudsen - single point      Prior Function Level of Independence: Independent with assistive device(s)         Comments: Pt is able to care for herself, family does help as needed     Hand Dominance        Extremity/Trunk Assessment   Upper Extremity Assessment Upper Extremity Assessment: Overall WFL for tasks assessed    Lower Extremity Assessment Lower Extremity Assessment: Overall WFL for tasks assessed       Communication   Communication: No difficulties  Cognition Arousal/Alertness: Awake/alert Behavior During Therapy: WFL for tasks assessed/performed Overall Cognitive Status: Within Functional Limits for tasks assessed                                        General Comments      Exercises     Assessment/Plan    PT Assessment Patent does not need any further PT services  PT Problem List  PT Treatment Interventions      PT Goals (Current goals can be found in the Care Plan section)  Acute Rehab PT Goals Patient Stated Goal: go home` PT Goal Formulation: All assessment and education complete, DC therapy    Frequency     Barriers to discharge        Co-evaluation               AM-PAC PT "6 Clicks" Daily Activity  Outcome Measure Difficulty turning over in bed (including adjusting bedclothes, sheets and blankets)?: None Difficulty moving from lying on back to sitting on the side of the bed? : None Difficulty sitting down on and standing up from a chair with arms (e.g., wheelchair, bedside commode, etc,.)?: None Help needed moving to and from a bed to chair (including a wheelchair)?: None Help needed walking in hospital room?: None Help needed climbing 3-5 steps with a railing? : None 6 Click Score: 24    End of Session Equipment Utilized During Treatment: Gait  belt Activity Tolerance: Patient tolerated treatment well Patient left: with chair alarm set;with call bell/phone within reach Nurse Communication: Mobility status PT Visit Diagnosis: Muscle weakness (generalized) (M62.81);Difficulty in walking, not elsewhere classified (R26.2)    Time: 9794-8016 PT Time Calculation (min) (ACUTE ONLY): 24 min   Charges:   PT Evaluation $PT Eval Low Complexity: 1 Low     PT G Codes:   PT G-Codes **NOT FOR INPATIENT CLASS** Functional Assessment Tool Used: AM-PAC 6 Clicks Basic Mobility Functional Limitation: Mobility: Walking and moving around Mobility: Walking and Moving Around Current Status (P5374): 0 percent impaired, limited or restricted Mobility: Walking and Moving Around Goal Status (M2707): 0 percent impaired, limited or restricted Mobility: Walking and Moving Around Discharge Status [phone]): 0 percent impaired, limited or restricted    Kreg Shropshire, DPT 02/19/2017, 11:59 AM

## 2017-02-19 NOTE — Discharge Instructions (Signed)
Heart healthy diet  Activity as tolerated  Check a blood pressure daily and keep log. Take your doctor's appointment.

## 2017-02-19 NOTE — Progress Notes (Signed)
Patient discharged via wheelchair and private vehicle. IV removed and catheter intact. All discharge instructions given and patient verbalizes understanding. Tele removed and returned. No prescriptions given to patient No distress noted.   

## 2017-02-19 NOTE — Consult Note (Signed)
Reason for Consult:ataxia  Referring Physician: Dr. Darvin Neighbours   CC: ataxia   HPI: Allison Novak is an 83 y.o. female woke up yesterday morning with dizziness and feeling off balance.    She had to use the wall for support as she went to the bathroom. She checked her blood pressure and it was 203/100. At baseline she states her blood pressure is normal. She denies any other symptomatology, she denies any headaches, nausea, vomiting or diarrhea. She denies any facial drooping, slurred speech, altered mentation, headaches or dysphasia. Symptoms lasted for 1.5 hrs and currently back to baseline.    Past Medical History:  Diagnosis Date  . A-fib ([place])   . Coronary artery disease   . Diverticulosis   . GERD (gastroesophageal reflux disease)   . Gout   . Hypertension   . Osteoporosis     Past Surgical History:  Procedure Laterality Date  . ABDOMINAL HYSTERECTOMY    . ANKLE SURGERY Left 2011  . KNEE ARTHROSCOPY Right     Family History  Problem Relation Age of Onset  . CAD Mother   . Hypertension Sister     Social History:  reports that she has never smoked. She has never used smokeless tobacco. She reports that she does not drink alcohol or use drugs.  Allergies  Allergen Reactions  . Keflex [Cephalexin] Other (See Comments)    Pt cant remember exact reaction  . Macrodantin [Nitrofurantoin Macrocrystal] Other (See Comments)    Medications: I have reviewed the patient's current medications.  ROS: History obtained from the patient  General ROS: negative for - chills, fatigue, fever, night sweats, weight gain or weight loss Psychological ROS: negative for - behavioral disorder, hallucinations, memory difficulties, mood swings or suicidal ideation Ophthalmic ROS: negative for - blurry vision, double vision, eye pain or loss of vision ENT ROS: negative for - epistaxis, nasal discharge, oral lesions, sore throat, tinnitus or vertigo Allergy and Immunology ROS: negative for - hives  or itchy/watery eyes Hematological and Lymphatic ROS: negative for - bleeding problems, bruising or swollen lymph nodes Endocrine ROS: negative for - galactorrhea, hair pattern changes, polydipsia/polyuria or temperature intolerance Respiratory ROS: negative for - cough, hemoptysis, shortness of breath or wheezing Cardiovascular ROS: negative for - chest pain, dyspnea on exertion, edema or irregular heartbeat Gastrointestinal ROS: negative for - abdominal pain, diarrhea, hematemesis, nausea/vomiting or stool incontinence Genito-Urinary ROS: negative for - dysuria, hematuria, incontinence or urinary frequency/urgency Musculoskeletal ROS: negative for - joint swelling or muscular weakness Neurological ROS: as noted in HPI Dermatological ROS: negative for rash and skin lesion changes  Physical Examination: Blood pressure (!) 156/82, pulse 69, temperature (!) 97.5 F (36.4 C), temperature source Oral, resp. rate 18, height 5\' 3"  (1.6 m), weight 70.3 kg (155 lb), SpO2 94 %.   Neurological Examination   Mental Status: Alert, oriented, thought content appropriate.  Speech fluent without evidence of aphasia.  Able to follow 3 step commands without difficulty. Cranial Nerves: II: Discs flat bilaterally; Visual fields grossly normal, pupils equal, round, reactive to light and accommodation III,IV, VI: ptosis not present, extra-ocular motions intact bilaterally V,VII: smile symmetric, facial light touch sensation normal bilaterally VIII: hearing normal bilaterally IX,X: gag reflex present XI: bilateral shoulder shrug XII: midline tongue extension Motor: Right : Upper extremity   5/5    Left:     Upper extremity   5/5  Lower extremity   5/5     Lower extremity   5/5 Tone and bulk:normal tone  throughout; no atrophy noted Sensory: Pinprick and light touch intact throughout, bilaterally Deep Tendon Reflexes: 2+ and symmetric throughout Plantars: Right: downgoing   Left:  downgoing Cerebellar: normal finger-to-nose, normal rapid alternating movements and normal heel-to-shin test Gait: not tested       Laboratory Studies:   Basic Metabolic Panel:  Recent Labs Lab 02/18/17 1226 02/19/17 0605  NA 139 140  K 4.1 3.5  CL 102 101  CO2 24 27  GLUCOSE 96 96  BUN 15 12  CREATININE 1.03* 0.95  CALCIUM 9.7 9.5    Liver Function Tests:  Recent Labs Lab 02/18/17 1226  AST 32  ALT 18  ALKPHOS 89  BILITOT 0.7  PROT 8.2*  ALBUMIN 4.4   No results for input(s): LIPASE, AMYLASE in the last 168 hours. No results for input(s): AMMONIA in the last 168 hours.  CBC:  Recent Labs Lab 02/18/17 1226 02/19/17 0605  WBC 7.2 6.4  HGB 13.8 13.8  HCT 41.9 41.8  MCV 90.7 91.9  PLT 197 180    Cardiac Enzymes:  Recent Labs Lab 02/18/17 1226  TROPONINI <0.03    BNP: Invalid input(s): POCBNP  CBG: No results for input(s): GLUCAP in the last 168 hours.  Microbiology: Results for orders placed or performed during the hospital encounter of 06/23/15  Urine culture     Status: None   Collection Time: 06/23/15  5:21 PM  Result Value Ref Range Status   Specimen Description URINE, RANDOM  Final   Special Requests NONE  Final   Culture 1,000 COLONIES/mL INSIGNIFICANT GROWTH  Final   Report Status 06/25/2015 FINAL  Final    Coagulation Studies: No results for input(s): LABPROT, INR in the last 72 hours.  Urinalysis:  Recent Labs Lab 02/18/17 1318  COLORURINE COLORLESS*  LABSPEC 1.002*  PHURINE 7.0  GLUCOSEU NEGATIVE  HGBUR NEGATIVE  BILIRUBINUR NEGATIVE  KETONESUR NEGATIVE  PROTEINUR NEGATIVE  NITRITE NEGATIVE  LEUKOCYTESUR NEGATIVE    Lipid Panel:     Component Value Date/Time   CHOL 186 02/19/2017 0605   TRIG 118 02/19/2017 0605   HDL 50 02/19/2017 0605   CHOLHDL 3.7 02/19/2017 0605   VLDL 24 02/19/2017 0605   LDLCALC 112 (H) 02/19/2017 0605    HgbA1C: No results found for: HGBA1C  Urine Drug Screen:  No results found  for: LABOPIA, COCAINSCRNUR, LABBENZ, AMPHETMU, THCU, LABBARB  Alcohol Level: No results for input(s): ETH in the last 168 hours.  Imaging: Ct Head Wo Contrast  Result Date: 02/18/2017 CLINICAL DATA:  83 y/o  F; unsteady gait and dizziness. EXAM: CT HEAD WITHOUT CONTRAST TECHNIQUE: Contiguous axial images were obtained from the base of the skull through the vertex without intravenous contrast. COMPARISON:  None. FINDINGS: Brain: No large acute infarct, hemorrhage, hydrocephalus, extra-axial collection, or focal mass effect. Patchy foci of hypoattenuation in subcortical and periventricular white matter is compatible with moderate chronic microvascular ischemic changes. Moderate brain parenchymal volume loss. Small chronic infarct within the left superior cerebellar hemisphere. Lucencies in bilateral thalamus consistent with age-indeterminate lacunar infarctions. Vascular: Calcific atherosclerosis of carotid siphons. No hyperdense vessel. Skull: Normal. Negative for fracture or focal lesion. Sinuses/Orbits: No acute finding. Other: None. IMPRESSION: 1. No large acute infarct, hemorrhage, or focal mass effect. 2. Moderate for age chronic microvascular ischemic changes and moderate parenchymal volume loss of the brain. 3. Age-indeterminate lacunar infarcts of bilateral thalami. Small chronic infarct of left superior cerebellar hemisphere. Electronically Signed   By: Kristine Garbe M.D.   On: 02/18/2017  16:41     Assessment/Plan:  83 y.o. female woke up yesterday morning with dizziness and feeling off balance.    She had to use the wall for support as she went to the bathroom. She checked her blood pressure and it was 203/100. At baseline she states her blood pressure is normal. She denies any other symptomatology, she denies any headaches, nausea, vomiting or diarrhea. She denies any facial drooping, slurred speech, altered mentation, headaches or dysphasia. Symptoms lasted for 1.5 hrs and  currently back to baseline.   Dizziness that described didn't sound to be positional.    Stroke work up under way Pt was on ASA 81 at home and was switched to aggrenox. Not sure this is the best option as she has hx of headaches and BID medication  Would con't ASA daily 81 unless ischemia or stenosis and would change to ASA 325 MRI and MRA pending Leotis Pain  02/19/2017, 12:01 PM

## 2017-02-21 NOTE — Discharge Summary (Signed)
[facility] [place] [place] NAME: Allison Novak    MR#:  [id number]  DATE OF BIRTH:  04-Dec-1925  DATE OF ADMISSION:  02/18/2017 ADMITTING PHYSICIAN: Quintella Baton, MD  DATE OF DISCHARGE: 02/19/2017  2:59 PM  PRIMARY CARE PHYSICIAN: Adin Hector, MD   ADMISSION DIAGNOSIS:  Balance problems [R26.89]  DISCHARGE DIAGNOSIS:  Active Problems:   HTN (hypertension), malignant   TIA (transient ischemic attack)   Dyslipidemia   GERD (gastroesophageal reflux disease)   SECONDARY DIAGNOSIS:   Past Medical History:  Diagnosis Date  . A-fib ([place])   . Coronary artery disease   . Diverticulosis   . GERD (gastroesophageal reflux disease)   . Gout   . Hypertension   . Osteoporosis      ADMITTING HISTORY  Chief Complaint:  Loss of balance  HPI: This is a 83 year old female who woke this morning from that she was dizzy and off-balance. She had to use the wall for support as she went to the bathroom. She checked her blood pressure and it was 203/100. At baseline she states her blood pressure is normal. She denies any other symptomatology, she denies any headaches, nausea, vomiting or diarrhea. She denies any facial drooping, slurred speech, altered mentation, headaches or dysphasia. She also denies any localized weakness. She states his been no change in her medication and she's been compliant with her medication. She takes a baby aspirin daily. She decided to come to ER. In the ER the patient's systolic blood pressure was 215. Her dizziness had resolved. History provided by the patient as well as her son' present at bedside.  HOSPITAL COURSE:   * TIA Patient was admitted to medical floor with telemetry monitoring. Her symptoms resolved completely. She had MRI of the brain which did not show any stroke. Carotid Dopplers showed no stenosis. Patient is being started on aspirin and statin. Patient could not get an echocardiogram due to it being a  weekend. I have restarted her primary care physician Dr. Caryl Comes who has kindly accepted to schedule this as outpatient soon.  * Uncontrolled hypertension Patient's amlodipine was recently stopped. I requested that she continue this and follow up with primary care physician.  Stable for discharge home.  CONSULTS OBTAINED:  Treatment Team:  Leotis Pain, MD  DRUG ALLERGIES:   Allergies  Allergen Reactions  . Keflex [Cephalexin] Other (See Comments)    Pt cant remember exact reaction  . Macrodantin [Nitrofurantoin Macrocrystal] Other (See Comments)    DISCHARGE MEDICATIONS:   Discharge Medication List as of 02/19/2017  1:44 PM    CONTINUE these medications which have NOT CHANGED   Details  allopurinol (ZYLOPRIM) 100 MG tablet Take 100 mg by mouth daily., Historical Med    amLODipine (NORVASC) 2.5 MG tablet Take 2.5 mg by mouth daily., Starting Tue 12/13/2016, Historical Med    aspirin EC 81 MG EC tablet Take 1 tablet (81 mg total) by mouth daily., Starting Wed 06/24/2015, OTC    atorvastatin (LIPITOR) 40 MG tablet Take 40 mg by mouth daily., Starting Fri 11/18/2016, Historical Med    losartan (COZAAR) 100 MG tablet Take 1 tablet (100 mg total) by mouth daily., Starting Wed 06/24/2015, Normal    !! metoprolol succinate (TOPROL-XL) 50 MG 24 hr tablet Take 50 mg by mouth daily., Starting Fri 11/18/2016, Historical Med    omeprazole (PRILOSEC) 20 MG capsule Take 20 mg by mouth daily., Historical Med    oxybutynin (DITROPAN) 5 MG tablet  Take 5 mg by mouth at bedtime., Historical Med    !! metoprolol succinate (TOPROL-XL) 25 MG 24 hr tablet Take 1 tablet (25 mg total) by mouth 2 (two) times daily., Starting Wed 06/24/2015, Normal     !! - Potential duplicate medications found. Please discuss with provider.      Today   VITAL SIGNS:  Blood pressure 140/65, pulse 72, temperature (!) 97.5 F (36.4 C), temperature source Oral, resp. rate 18, height 5\' 3"  (1.6 m), weight 70.3 kg  (155 lb), SpO2 96 %.  I/O:  No intake or output data in the 24 hours ending 02/21/17 1314  PHYSICAL EXAMINATION:  Physical Exam  GENERAL:  83 y.o.-year-old patient lying in the bed with no acute distress.  LUNGS: Normal breath sounds bilaterally, no wheezing, rales,rhonchi or crepitation. No use of accessory muscles of respiration.  CARDIOVASCULAR: S1, S2 normal. No murmurs, rubs, or gallops.  ABDOMEN: Soft, non-tender, non-distended. Bowel sounds present. No organomegaly or mass.  NEUROLOGIC: Moves all 4 extremities. PSYCHIATRIC: The patient is alert and oriented x 3.  SKIN: No obvious rash, lesion, or ulcer.   DATA REVIEW:   CBC  Recent Labs Lab 02/19/17 0605  WBC 6.4  HGB 13.8  HCT 41.8  PLT 180    Chemistries   Recent Labs Lab 02/18/17 1226 02/19/17 0605  NA 139 140  K 4.1 3.5  CL 102 101  CO2 24 27  GLUCOSE 96 96  BUN 15 12  CREATININE 1.03* 0.95  CALCIUM 9.7 9.5  AST 32  --   ALT 18  --   ALKPHOS 89  --   BILITOT 0.7  --     Cardiac Enzymes  Recent Labs Lab 02/18/17 1226  [place] <0.03    Microbiology Results  Results for orders placed or performed during the hospital encounter of 06/23/15  Urine culture     Status: None   Collection Time: 06/23/15  5:21 PM  Result Value Ref Range Status   Specimen Description URINE, RANDOM  Final   Special Requests NONE  Final   Culture 1,000 COLONIES/mL INSIGNIFICANT GROWTH  Final   Report Status 06/25/2015 FINAL  Final    RADIOLOGY:  No results found.  Follow up with PCP in 1 week.  Management plans discussed with the patient, family and they are in agreement.  CODE STATUS:  Code Status History    Date Active Date Inactive Code Status Order ID Comments User Context   02/18/2017  9:46 PM 02/19/2017  6:28 PM Full Code [id number]  Quintella Baton, MD Inpatient   06/23/2015  5:42 PM 06/24/2015  9:01 PM Full Code [id number]  Hillary Bow, MD Inpatient    Advance Directive Documentation     Most  Recent Value  Type of Advance Directive  Living will  Pre-existing out of facility DNR order (yellow form or pink MOST form)  -  "MOST" Form in Place?  -      TOTAL TIME TAKING CARE OF THIS PATIENT ON DAY OF DISCHARGE: more than 30 minutes.   Hillary Bow R M.D on 02/21/2017 at 1:14 PM  Between 7am to 6pm - Pager - [phone]  After 6pm go to [email or web address] - password [email or web address] [email or web address]  [facility]  Office  [phone]  CC: Primary care physician; Adin Hector, MD  Note: This dictation was prepared with Dragon dictation along with smaller phrase technology. Any transcriptional errors that result from this process are unintentional.

## 2017-02-23 DIAGNOSIS — I779 Disorder of arteries and arterioles, unspecified: Secondary | ICD-10-CM | POA: Diagnosis present

## 2017-02-23 DIAGNOSIS — Z8673 Personal history of transient ischemic attack (TIA), and cerebral infarction without residual deficits: Secondary | ICD-10-CM

## 2017-09-22 ENCOUNTER — Other Ambulatory Visit
Admission: RE | Admit: 2017-09-22 | Discharge: 2017-09-22 | Disposition: A | Discharge status: Home / Self Care | Payer: Medicare Other | Source: Ambulatory Visit | Attending: Gastroenterology | Admitting: Gastroenterology

## 2017-09-22 DIAGNOSIS — D649 Anemia, unspecified: Secondary | ICD-10-CM | POA: Insufficient documentation

## 2017-09-24 LAB — H. PYLORI ANTIGEN, STOOL: H. Pylori Stool Ag, Eia: NEGATIVE

## 2017-09-26 ENCOUNTER — Other Ambulatory Visit: Payer: Self-pay | Admitting: Gastroenterology

## 2017-09-26 DIAGNOSIS — R1084 Generalized abdominal pain: Secondary | ICD-10-CM

## 2017-09-26 DIAGNOSIS — R634 Abnormal weight loss: Secondary | ICD-10-CM

## 2017-09-26 DIAGNOSIS — D649 Anemia, unspecified: Secondary | ICD-10-CM

## 2017-10-09 ENCOUNTER — Other Ambulatory Visit: Payer: Self-pay | Admitting: Gastroenterology

## 2017-10-09 ENCOUNTER — Ambulatory Visit
Admission: RE | Admit: 2017-10-09 | Discharge: 2017-10-09 | Disposition: A | Discharge status: Home / Self Care | Payer: Medicare Other | Source: Ambulatory Visit | Attending: Gastroenterology | Admitting: Gastroenterology

## 2017-10-09 DIAGNOSIS — D649 Anemia, unspecified: Secondary | ICD-10-CM

## 2017-10-09 DIAGNOSIS — R634 Abnormal weight loss: Secondary | ICD-10-CM

## 2017-10-09 DIAGNOSIS — R918 Other nonspecific abnormal finding of lung field: Secondary | ICD-10-CM | POA: Diagnosis not present

## 2017-10-09 DIAGNOSIS — I700 Atherosclerosis of aorta: Secondary | ICD-10-CM | POA: Diagnosis not present

## 2017-10-09 DIAGNOSIS — R1084 Generalized abdominal pain: Secondary | ICD-10-CM

## 2017-10-09 DIAGNOSIS — K449 Diaphragmatic hernia without obstruction or gangrene: Secondary | ICD-10-CM | POA: Insufficient documentation

## 2017-10-09 DIAGNOSIS — E042 Nontoxic multinodular goiter: Secondary | ICD-10-CM | POA: Insufficient documentation

## 2017-10-09 DIAGNOSIS — I2510 Atherosclerotic heart disease of native coronary artery without angina pectoris: Secondary | ICD-10-CM | POA: Diagnosis not present

## 2017-10-09 DIAGNOSIS — R590 Localized enlarged lymph nodes: Secondary | ICD-10-CM | POA: Diagnosis not present

## 2017-10-09 MED ORDER — IOHEXOL 300 MG/ML  SOLN
75.0000 mL | Freq: Once | INTRAMUSCULAR | Status: AC | PRN
  Administered 2017-10-09: 75 mL via INTRAVENOUS

## 2017-10-12 ENCOUNTER — Ambulatory Visit
Admission: RE | Admit: 2017-10-12 | Discharge: 2017-10-12 | Disposition: A | Discharge status: Home / Self Care | Payer: Medicare Other | Source: Ambulatory Visit | Attending: Gastroenterology | Admitting: Gastroenterology

## 2017-10-12 DIAGNOSIS — D649 Anemia, unspecified: Secondary | ICD-10-CM | POA: Insufficient documentation

## 2017-10-12 DIAGNOSIS — I700 Atherosclerosis of aorta: Secondary | ICD-10-CM | POA: Insufficient documentation

## 2017-10-12 DIAGNOSIS — K5730 Diverticulosis of large intestine without perforation or abscess without bleeding: Secondary | ICD-10-CM | POA: Diagnosis not present

## 2017-10-12 DIAGNOSIS — K8020 Calculus of gallbladder without cholecystitis without obstruction: Secondary | ICD-10-CM | POA: Diagnosis not present

## 2017-10-12 DIAGNOSIS — R634 Abnormal weight loss: Secondary | ICD-10-CM

## 2017-10-12 DIAGNOSIS — K449 Diaphragmatic hernia without obstruction or gangrene: Secondary | ICD-10-CM | POA: Diagnosis not present

## 2017-10-12 DIAGNOSIS — R1084 Generalized abdominal pain: Secondary | ICD-10-CM | POA: Insufficient documentation

## 2017-10-12 LAB — POCT I-STAT CREATININE: Creatinine, Ser: 1 mg/dL (ref 0.44–1.00)

## 2017-10-12 MED ORDER — IOHEXOL 300 MG/ML  SOLN
100.0000 mL | Freq: Once | INTRAMUSCULAR | Status: AC | PRN
  Administered 2017-10-12: 100 mL via INTRAVENOUS

## 2017-10-30 ENCOUNTER — Other Ambulatory Visit: Payer: Self-pay | Admitting: Internal Medicine

## 2017-10-30 DIAGNOSIS — R911 Solitary pulmonary nodule: Secondary | ICD-10-CM | POA: Insufficient documentation

## 2017-10-30 DIAGNOSIS — R0602 Shortness of breath: Secondary | ICD-10-CM

## 2017-11-15 ENCOUNTER — Other Ambulatory Visit: Payer: Self-pay | Admitting: Gastroenterology

## 2017-11-15 DIAGNOSIS — R6881 Early satiety: Secondary | ICD-10-CM

## 2017-11-15 DIAGNOSIS — R634 Abnormal weight loss: Secondary | ICD-10-CM

## 2017-11-16 ENCOUNTER — Other Ambulatory Visit: Payer: Self-pay | Admitting: Gastroenterology

## 2017-11-16 DIAGNOSIS — R6881 Early satiety: Secondary | ICD-10-CM

## 2017-11-16 DIAGNOSIS — R634 Abnormal weight loss: Secondary | ICD-10-CM

## 2017-11-23 ENCOUNTER — Ambulatory Visit
Admission: RE | Admit: 2017-11-23 | Discharge: 2017-11-23 | Disposition: A | Discharge status: Home / Self Care | Payer: Medicare Other | Source: Ambulatory Visit | Attending: Gastroenterology | Admitting: Gastroenterology

## 2017-11-23 DIAGNOSIS — R634 Abnormal weight loss: Secondary | ICD-10-CM | POA: Diagnosis present

## 2017-11-23 DIAGNOSIS — K219 Gastro-esophageal reflux disease without esophagitis: Secondary | ICD-10-CM | POA: Diagnosis not present

## 2017-11-23 DIAGNOSIS — R6881 Early satiety: Secondary | ICD-10-CM | POA: Insufficient documentation

## 2017-11-23 DIAGNOSIS — K449 Diaphragmatic hernia without obstruction or gangrene: Secondary | ICD-10-CM | POA: Diagnosis not present

## 2017-12-16 ENCOUNTER — Ambulatory Visit: Payer: Medicare Other

## 2018-04-27 ENCOUNTER — Ambulatory Visit
Admission: RE | Admit: 2018-04-27 | Discharge: 2018-04-27 | Disposition: A | Discharge status: Home / Self Care | Payer: Medicare Other | Source: Ambulatory Visit | Attending: Internal Medicine | Admitting: Internal Medicine

## 2018-04-27 DIAGNOSIS — R0602 Shortness of breath: Secondary | ICD-10-CM | POA: Insufficient documentation

## 2018-04-27 DIAGNOSIS — R911 Solitary pulmonary nodule: Secondary | ICD-10-CM | POA: Insufficient documentation

## 2018-12-28 IMAGING — CT CT ABD-PELV W/ CM
2 of 5 series · 16 of 46 positions shown, 18 images · IV contrast (APPLIED)
Comparison: CT abdomen and pelvis 06/14/2015.

CLINICAL DATA: 10 pound weight loss since February 2017. Right
posterior chest pain and cough for 3 months.

EXAM:
CT ABDOMEN AND PELVIS WITH CONTRAST
TECHNIQUE: Multidetector CT imaging of the abdomen and pelvis was performed
using the standard protocol following bolus administration of
intravenous contrast.
CONTRAST:  100 ml OMNIPAQUE IOHEXOL 300 MG/ML  SOLN

[Series 2: routine abd/pel with · axial · 0.72mm/px · z∈[-415,-55]mm · 13 of 81 slices shown, 15 images]
[im 5/81  soft-tissue]
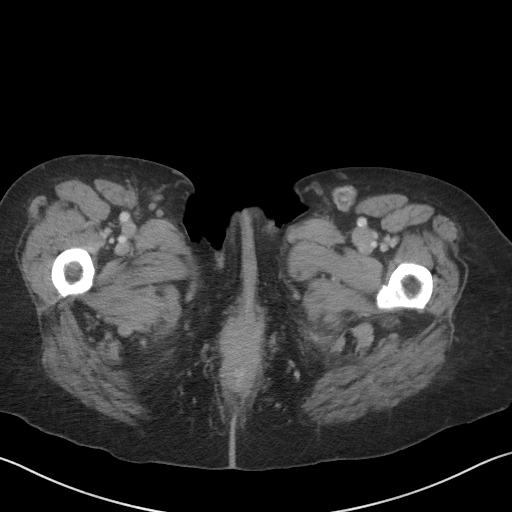
[im 5/81  bone]
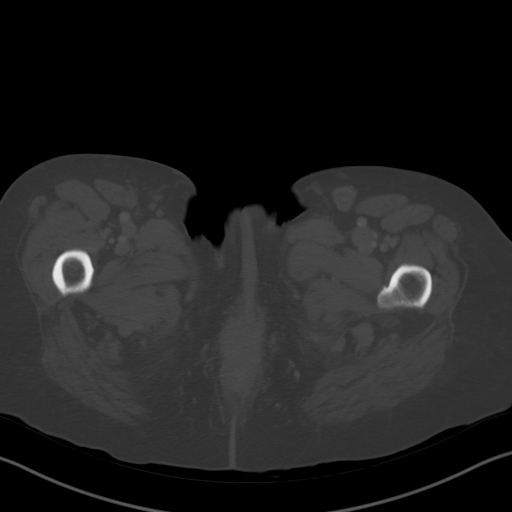
[im 13/81  soft-tissue]
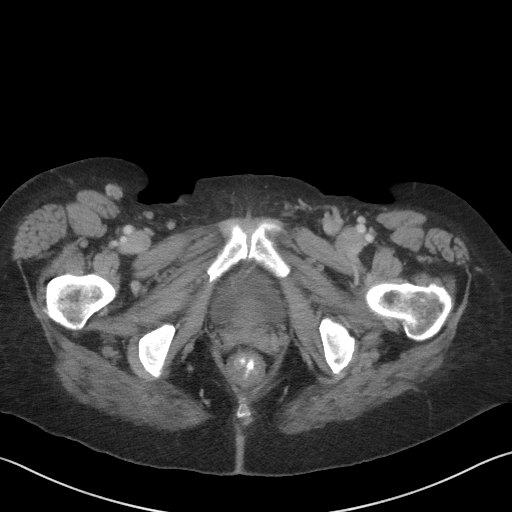
[im 17/81  soft-tissue]
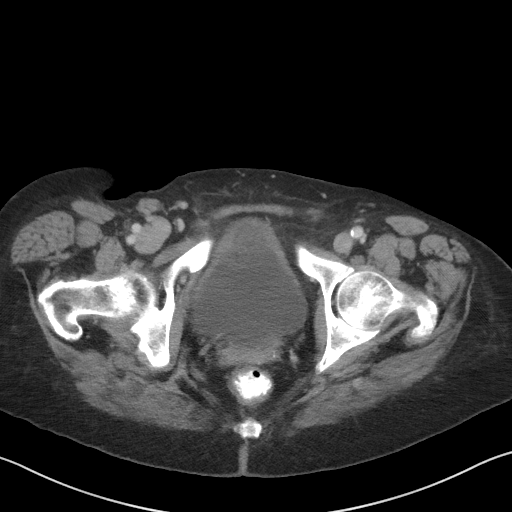
[im 25/81  soft-tissue]
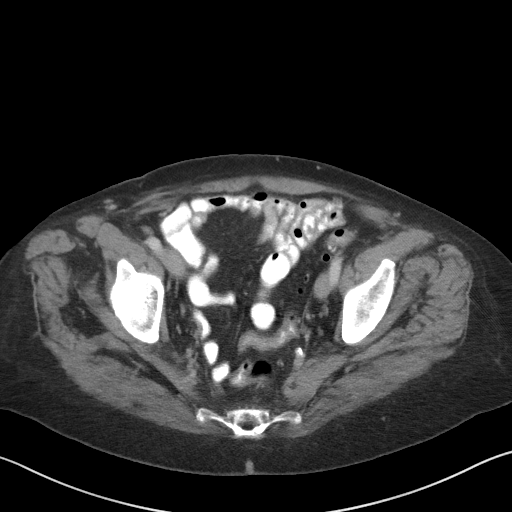
[im 29/81  soft-tissue]
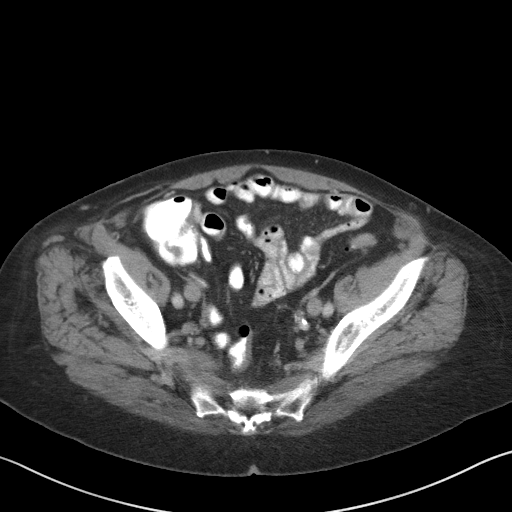
[im 37/81  soft-tissue]
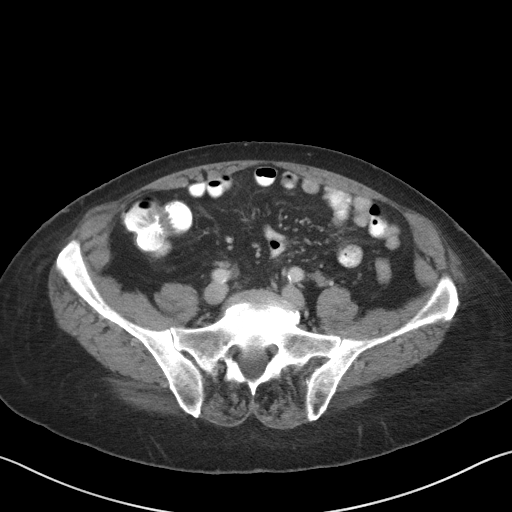
[im 41/81  soft-tissue]
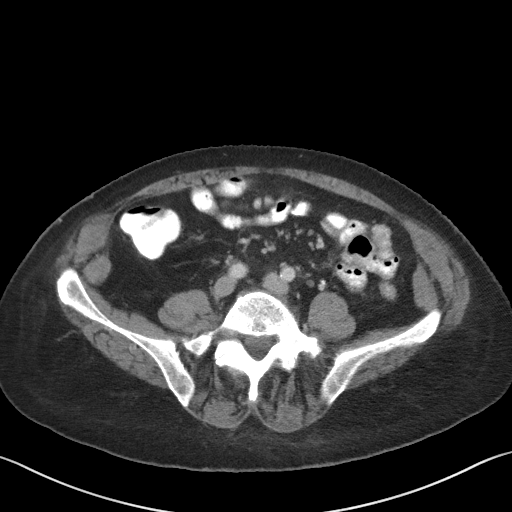
[im 45/81  soft-tissue]
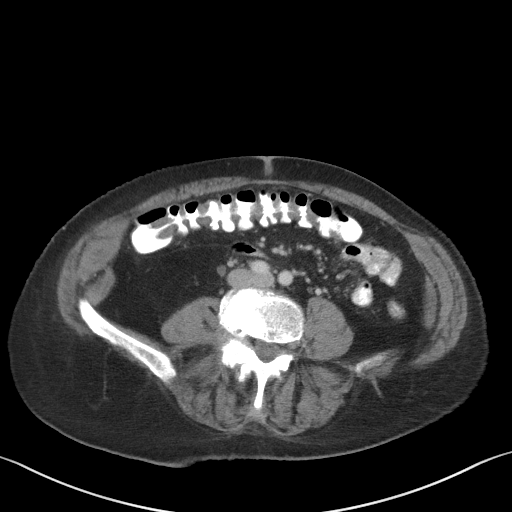
[im 53/81  soft-tissue]
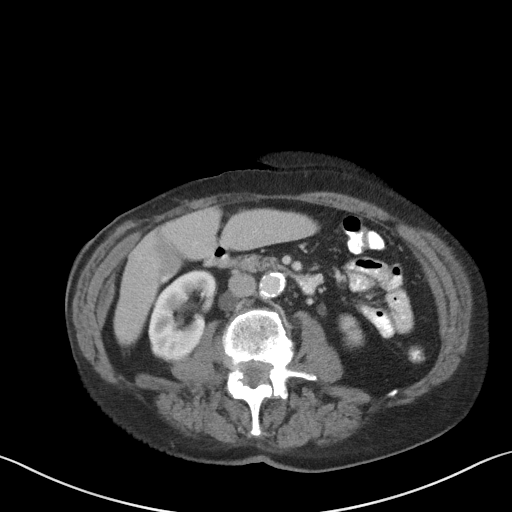
[im 53/81  bone]
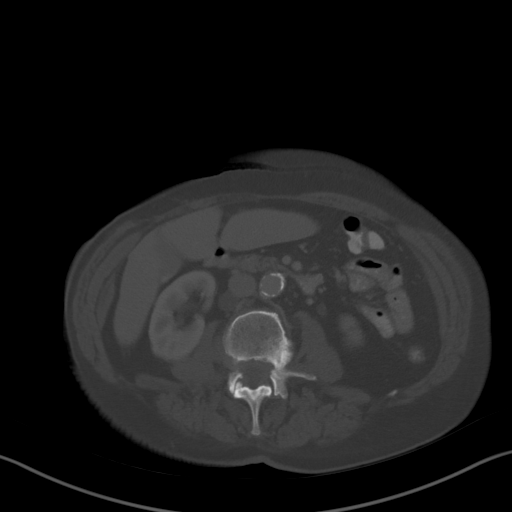
[im 57/81  soft-tissue]
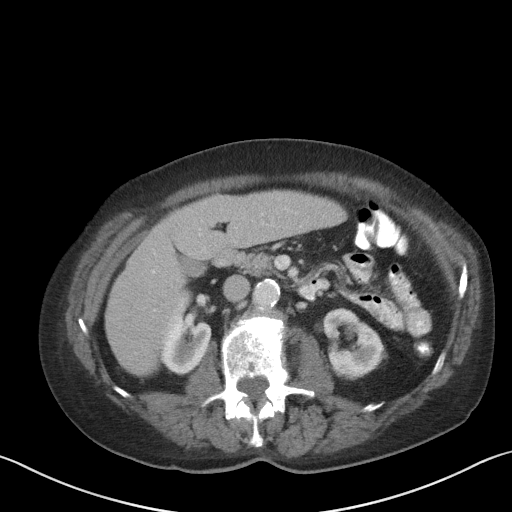
[im 65/81  soft-tissue]
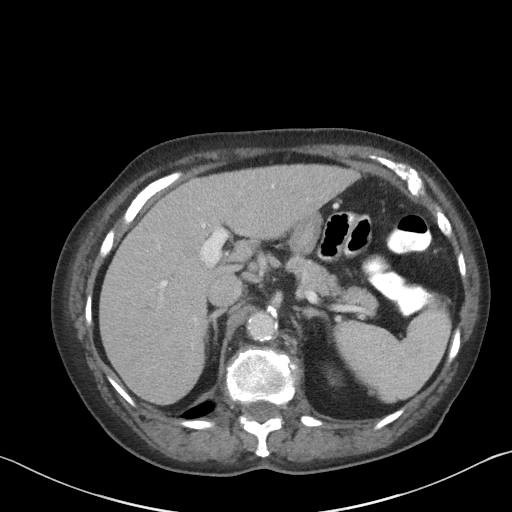
[im 69/81  soft-tissue]
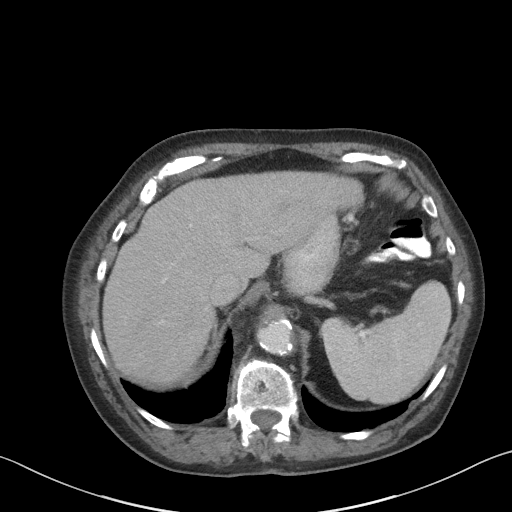
[im 77/81  soft-tissue]
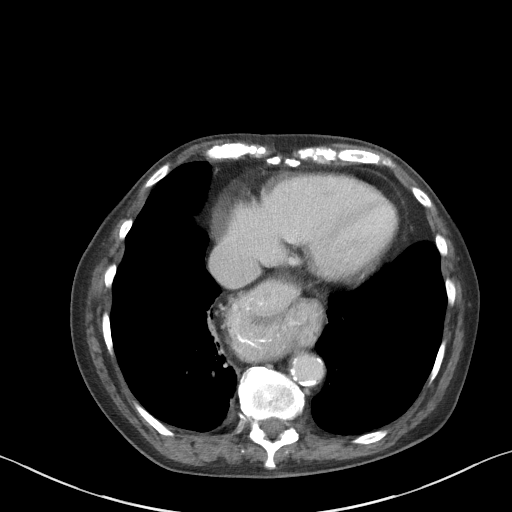

[Series 5: coronal st · coronal · 0.69mm/px · 3 of 79 slices shown]
[im 27/79  soft-tissue]
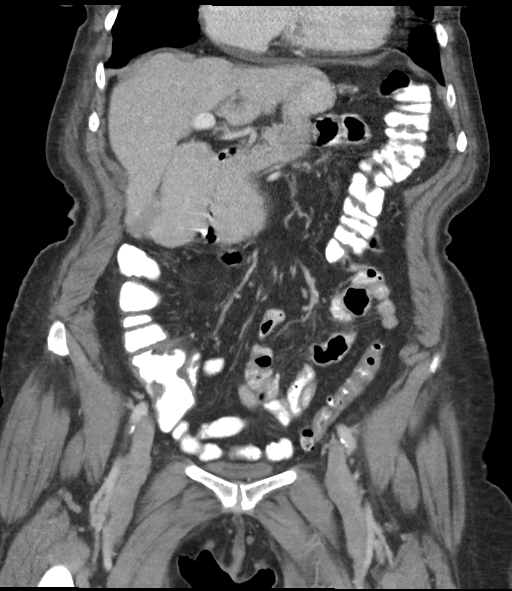
[im 35/79  soft-tissue]
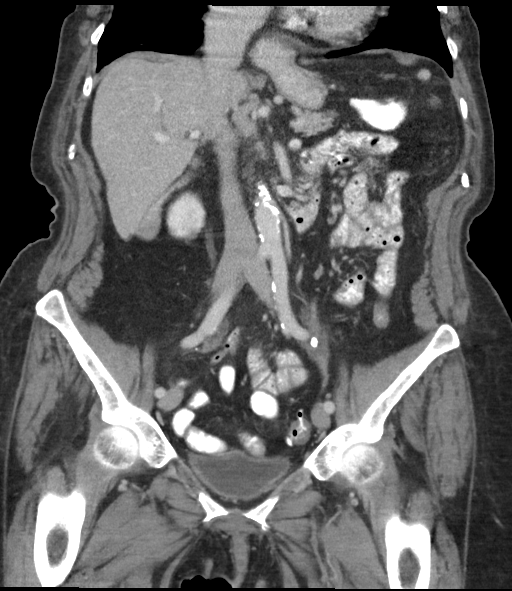
[im 44/79  soft-tissue]
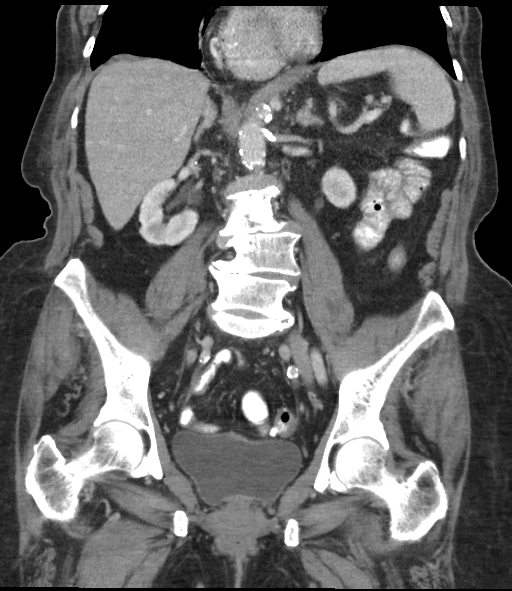

[16 of 46 positions shown; findings below may reference images not displayed]

FINDINGS: Lower chest: Lung bases are clear. No pleural or pericardial
effusion. Cardiomegaly noted.

Hepatobiliary: 1.2 cm cyst in left hepatic lobe is again seen. The
liver is otherwise unremarkable. There are some tiny gravel type
stones within the gallbladder but no evidence of cholecystitis.
Biliary tree is unremarkable.

Pancreas: Unremarkable. No pancreatic ductal dilatation or
surrounding inflammatory changes.

Spleen: Normal in size without focal abnormality.

Adrenals/Urinary Tract: Scarring in the anterior aspect of the lower
pole of the left kidney is identified. The kidneys are otherwise
unremarkable. Ureters and urinary bladder appear normal. Adrenal
glands appear normal.

Stomach/Bowel: Sigmoid diverticulosis without diverticulitis is
seen. The colon is otherwise unremarkable. Hiatal hernia is
unchanged. Small bowel and appendix appear normal.

Vascular/Lymphatic: Aortic atherosclerosis. No enlarged abdominal or
pelvic lymph nodes.

Reproductive: Status post hysterectomy. No adnexal masses.

Other: No ascites.

Musculoskeletal: No acute or focal bony abnormality. Lumbar
scoliosis and multilevel degenerative change noted.
IMPRESSION: No acute abnormality abdomen or pelvis. No finding to explain the
patient's symptoms.

Hiatal hernia.

A few tiny gallstones are present but there is no evidence of
cholecystitis.

Sigmoid diverticulosis without diverticulitis.

Atherosclerosis.

## 2019-05-24 ENCOUNTER — Other Ambulatory Visit: Payer: Self-pay | Admitting: Internal Medicine

## 2019-05-24 DIAGNOSIS — R911 Solitary pulmonary nodule: Secondary | ICD-10-CM

## 2019-06-07 ENCOUNTER — Ambulatory Visit
Admission: RE | Admit: 2019-06-07 | Discharge: 2019-06-07 | Disposition: A | Discharge status: Home / Self Care | Payer: Medicare Other | Source: Ambulatory Visit | Attending: Internal Medicine | Admitting: Internal Medicine

## 2019-06-07 ENCOUNTER — Other Ambulatory Visit: Payer: Self-pay

## 2019-06-07 DIAGNOSIS — R911 Solitary pulmonary nodule: Secondary | ICD-10-CM | POA: Insufficient documentation

## 2019-11-21 DIAGNOSIS — D692 Other nonthrombocytopenic purpura: Secondary | ICD-10-CM | POA: Insufficient documentation

## 2020-02-16 ENCOUNTER — Inpatient Hospital Stay
Admit: 2020-02-16 | Discharge: 2020-02-16 | Disposition: A | Discharge status: Home / Self Care | Payer: Medicare Other | Attending: Internal Medicine | Admitting: Internal Medicine

## 2020-02-16 ENCOUNTER — Other Ambulatory Visit: Payer: Self-pay

## 2020-02-16 ENCOUNTER — Emergency Department: Payer: Medicare Other

## 2020-02-16 ENCOUNTER — Inpatient Hospital Stay
Admission: EM | Admit: 2020-02-16 | Discharge: 2020-02-18 | DRG: 291 | Disposition: A | Discharge status: Home / Self Care | Payer: Medicare Other | Attending: Internal Medicine | Admitting: Internal Medicine

## 2020-02-16 DIAGNOSIS — Z90710 Acquired absence of both cervix and uterus: Secondary | ICD-10-CM | POA: Diagnosis not present

## 2020-02-16 DIAGNOSIS — Z7982 Long term (current) use of aspirin: Secondary | ICD-10-CM | POA: Diagnosis not present

## 2020-02-16 DIAGNOSIS — I5033 Acute on chronic diastolic (congestive) heart failure: Secondary | ICD-10-CM | POA: Diagnosis present

## 2020-02-16 DIAGNOSIS — J9601 Acute respiratory failure with hypoxia: Secondary | ICD-10-CM | POA: Diagnosis present

## 2020-02-16 DIAGNOSIS — T502X5A Adverse effect of carbonic-anhydrase inhibitors, benzothiadiazides and other diuretics, initial encounter: Secondary | ICD-10-CM | POA: Diagnosis present

## 2020-02-16 DIAGNOSIS — Z888 Allergy status to other drugs, medicaments and biological substances status: Secondary | ICD-10-CM

## 2020-02-16 DIAGNOSIS — M109 Gout, unspecified: Secondary | ICD-10-CM | POA: Diagnosis present

## 2020-02-16 DIAGNOSIS — Z8249 Family history of ischemic heart disease and other diseases of the circulatory system: Secondary | ICD-10-CM | POA: Diagnosis not present

## 2020-02-16 DIAGNOSIS — D6859 Other primary thrombophilia: Secondary | ICD-10-CM | POA: Diagnosis present

## 2020-02-16 DIAGNOSIS — M810 Age-related osteoporosis without current pathological fracture: Secondary | ICD-10-CM | POA: Diagnosis present

## 2020-02-16 DIAGNOSIS — I4891 Unspecified atrial fibrillation: Secondary | ICD-10-CM

## 2020-02-16 DIAGNOSIS — Z7901 Long term (current) use of anticoagulants: Secondary | ICD-10-CM | POA: Diagnosis not present

## 2020-02-16 DIAGNOSIS — I700 Atherosclerosis of aorta: Secondary | ICD-10-CM | POA: Diagnosis present

## 2020-02-16 DIAGNOSIS — Z79899 Other long term (current) drug therapy: Secondary | ICD-10-CM

## 2020-02-16 DIAGNOSIS — I10 Essential (primary) hypertension: Secondary | ICD-10-CM | POA: Diagnosis present

## 2020-02-16 DIAGNOSIS — R0902 Hypoxemia: Secondary | ICD-10-CM | POA: Diagnosis not present

## 2020-02-16 DIAGNOSIS — E785 Hyperlipidemia, unspecified: Secondary | ICD-10-CM | POA: Diagnosis not present

## 2020-02-16 DIAGNOSIS — Z8673 Personal history of transient ischemic attack (TIA), and cerebral infarction without residual deficits: Secondary | ICD-10-CM | POA: Diagnosis not present

## 2020-02-16 DIAGNOSIS — K219 Gastro-esophageal reflux disease without esophagitis: Secondary | ICD-10-CM | POA: Diagnosis present

## 2020-02-16 DIAGNOSIS — I4819 Other persistent atrial fibrillation: Secondary | ICD-10-CM | POA: Diagnosis present

## 2020-02-16 DIAGNOSIS — I779 Disorder of arteries and arterioles, unspecified: Secondary | ICD-10-CM | POA: Diagnosis present

## 2020-02-16 DIAGNOSIS — E876 Hypokalemia: Secondary | ICD-10-CM | POA: Diagnosis not present

## 2020-02-16 DIAGNOSIS — Z20822 Contact with and (suspected) exposure to covid-19: Secondary | ICD-10-CM | POA: Diagnosis present

## 2020-02-16 DIAGNOSIS — R002 Palpitations: Secondary | ICD-10-CM

## 2020-02-16 DIAGNOSIS — I2510 Atherosclerotic heart disease of native coronary artery without angina pectoris: Secondary | ICD-10-CM | POA: Diagnosis present

## 2020-02-16 DIAGNOSIS — I110 Hypertensive heart disease with heart failure: Secondary | ICD-10-CM | POA: Diagnosis present

## 2020-02-16 DIAGNOSIS — I509 Heart failure, unspecified: Secondary | ICD-10-CM

## 2020-02-16 LAB — CBC WITH DIFFERENTIAL/PLATELET
Abs Immature Granulocytes: 0.03 10*3/uL (ref 0.00–0.07)
Basophils Absolute: 0.1 10*3/uL (ref 0.0–0.1)
Basophils Relative: 1 %
Eosinophils Absolute: 0.1 10*3/uL (ref 0.0–0.5)
Eosinophils Relative: 1 %
HCT: 35.4 % — ABNORMAL LOW (ref 36.0–46.0)
Hemoglobin: 11.7 g/dL — ABNORMAL LOW (ref 12.0–15.0)
Immature Granulocytes: 0 %
Lymphocytes Relative: 15 %
Lymphs Abs: 1.2 10*3/uL (ref 0.7–4.0)
MCH: 30.7 pg (ref 26.0–34.0)
MCHC: 33.1 g/dL (ref 30.0–36.0)
MCV: 92.9 fL (ref 80.0–100.0)
Monocytes Absolute: 0.8 10*3/uL (ref 0.1–1.0)
Monocytes Relative: 10 %
Neutro Abs: 6.2 10*3/uL (ref 1.7–7.7)
Neutrophils Relative %: 73 %
Platelets: 185 10*3/uL (ref 150–400)
RBC: 3.81 MIL/uL — ABNORMAL LOW (ref 3.87–5.11)
RDW: 14.2 % (ref 11.5–15.5)
WBC: 8.4 10*3/uL (ref 4.0–10.5)
nRBC: 0 % (ref 0.0–0.2)

## 2020-02-16 LAB — COMPREHENSIVE METABOLIC PANEL
ALT: 30 U/L (ref 0–44)
AST: 51 U/L — ABNORMAL HIGH (ref 15–41)
Albumin: 4.1 g/dL (ref 3.5–5.0)
Alkaline Phosphatase: 111 U/L (ref 38–126)
Anion gap: 13 (ref 5–15)
BUN: 13 mg/dL (ref 8–23)
CO2: 23 mmol/L (ref 22–32)
Calcium: 9 mg/dL (ref 8.9–10.3)
Chloride: 98 mmol/L (ref 98–111)
Creatinine, Ser: 0.75 mg/dL (ref 0.44–1.00)
GFR, Estimated: >60 mL/min (ref >60)
Glucose, Bld: 152 mg/dL — ABNORMAL HIGH (ref 70–99)
Potassium: 3.1 mmol/L — ABNORMAL LOW (ref 3.5–5.1)
Sodium: 134 mmol/L — ABNORMAL LOW (ref 135–145)
Total Bilirubin: 0.7 mg/dL (ref 0.3–1.2)
Total Protein: 7.2 g/dL (ref 6.5–8.1)

## 2020-02-16 LAB — ECHOCARDIOGRAM COMPLETE
AR max vel: 1.87 cm2
AV Area VTI: 1.45 cm2
AV Area mean vel: 1.71 cm2
AV Mean grad: 2 mmHg
AV Peak grad: 4.3 mmHg
Ao pk vel: 1.04 m/s
Area-P 1/2: 4.24 cm2
Height: 63 in
S' Lateral: 3.04 cm
Weight: 2240 oz

## 2020-02-16 LAB — BASIC METABOLIC PANEL
Anion gap: 11 (ref 5–15)
BUN: 17 mg/dL (ref 8–23)
CO2: 28 mmol/L (ref 22–32)
Calcium: 9.1 mg/dL (ref 8.9–10.3)
Chloride: 94 mmol/L — ABNORMAL LOW (ref 98–111)
Creatinine, Ser: 1.14 mg/dL — ABNORMAL HIGH (ref 0.44–1.00)
GFR, Estimated: 42 mL/min — ABNORMAL LOW (ref >60)
Glucose, Bld: 115 mg/dL — ABNORMAL HIGH (ref 70–99)
Potassium: 4 mmol/L (ref 3.5–5.1)
Sodium: 133 mmol/L — ABNORMAL LOW (ref 135–145)

## 2020-02-16 LAB — RESPIRATORY PANEL BY RT PCR (FLU A&B, COVID)
Influenza A by PCR: NEGATIVE
Influenza B by PCR: NEGATIVE
SARS Coronavirus 2 by RT PCR: NEGATIVE

## 2020-02-16 LAB — TROPONIN I (HIGH SENSITIVITY)
Troponin I (High Sensitivity): 7 ng/L (ref <18)
Troponin I (High Sensitivity): 8 ng/L (ref <18)

## 2020-02-16 LAB — BRAIN NATRIURETIC PEPTIDE: B Natriuretic Peptide: 272.8 pg/mL — ABNORMAL HIGH (ref 0.0–100.0)

## 2020-02-16 LAB — MAGNESIUM
Magnesium: 2 mg/dL (ref 1.7–2.4)
Magnesium: 2 mg/dL (ref 1.7–2.4)

## 2020-02-16 MED ORDER — FUROSEMIDE 10 MG/ML IJ SOLN
20.0000 mg | Freq: Two times a day (BID) | INTRAMUSCULAR | Status: AC
  Administered 2020-02-16 (×2): 20 mg via INTRAVENOUS
  Filled 2020-02-16 (×2): qty 4

## 2020-02-16 MED ORDER — DILTIAZEM HCL-DEXTROSE 125-5 MG/125ML-% IV SOLN (PREMIX): 5.0000 mg/h | INTRAVENOUS | Status: DC

## 2020-02-16 MED ORDER — FUROSEMIDE 10 MG/ML IJ SOLN: 20.0000 mg | Freq: Two times a day (BID) | INTRAMUSCULAR | Status: DC

## 2020-02-16 MED ORDER — ACETAMINOPHEN 325 MG PO TABS: 650.0000 mg | ORAL_TABLET | ORAL | Status: DC | PRN

## 2020-02-16 MED ORDER — METOPROLOL SUCCINATE ER 50 MG PO TB24
200.0000 mg | ORAL_TABLET | Freq: Every day | ORAL | Status: DC
  Administered 2020-02-17 – 2020-02-18 (×2): 200 mg via ORAL
  Filled 2020-02-16 (×2): qty 4

## 2020-02-16 MED ORDER — APIXABAN 2.5 MG PO TABS
2.5000 mg | ORAL_TABLET | Freq: Two times a day (BID) | ORAL | Status: DC
  Filled 2020-02-16: qty 1

## 2020-02-16 MED ORDER — ATORVASTATIN CALCIUM 20 MG PO TABS
40.0000 mg | ORAL_TABLET | Freq: Every day | ORAL | Status: DC
  Administered 2020-02-16 – 2020-02-18 (×3): 40 mg via ORAL
  Filled 2020-02-16 (×3): qty 2

## 2020-02-16 MED ORDER — METHOCARBAMOL 500 MG PO TABS
500.0000 mg | ORAL_TABLET | Freq: Once | ORAL | Status: AC
  Administered 2020-02-16: 500 mg via ORAL
  Filled 2020-02-16: qty 1

## 2020-02-16 MED ORDER — POTASSIUM CHLORIDE 20 MEQ PO PACK
40.0000 meq | PACK | Freq: Once | ORAL | Status: AC
  Administered 2020-02-16: 40 meq via ORAL
  Filled 2020-02-16: qty 2

## 2020-02-16 MED ORDER — LOSARTAN POTASSIUM 50 MG PO TABS
100.0000 mg | ORAL_TABLET | Freq: Every day | ORAL | Status: DC
  Administered 2020-02-16 – 2020-02-17 (×2): 100 mg via ORAL
  Filled 2020-02-16 (×2): qty 2

## 2020-02-16 MED ORDER — METOPROLOL SUCCINATE ER 50 MG PO TB24
150.0000 mg | ORAL_TABLET | Freq: Every day | ORAL | Status: DC
  Administered 2020-02-16: 150 mg via ORAL
  Filled 2020-02-16: qty 3

## 2020-02-16 MED ORDER — PANTOPRAZOLE SODIUM 40 MG PO TBEC
40.0000 mg | DELAYED_RELEASE_TABLET | Freq: Every day | ORAL | Status: DC
  Administered 2020-02-16 – 2020-02-18 (×3): 40 mg via ORAL
  Filled 2020-02-16 (×3): qty 1

## 2020-02-16 MED ORDER — ALPRAZOLAM 0.25 MG PO TABS: 0.2500 mg | ORAL_TABLET | Freq: Two times a day (BID) | ORAL | Status: DC | PRN

## 2020-02-16 MED ORDER — DILTIAZEM HCL 25 MG/5ML IV SOLN
20.0000 mg | Freq: Once | INTRAVENOUS | Status: AC
  Administered 2020-02-16: 20 mg via INTRAVENOUS
  Filled 2020-02-16: qty 5

## 2020-02-16 MED ORDER — ONDANSETRON HCL 4 MG/2ML IJ SOLN: 4.0000 mg | Freq: Four times a day (QID) | INTRAMUSCULAR | Status: DC | PRN

## 2020-02-16 MED ORDER — ALLOPURINOL 100 MG PO TABS
100.0000 mg | ORAL_TABLET | Freq: Every day | ORAL | Status: DC
  Administered 2020-02-16 – 2020-02-18 (×3): 100 mg via ORAL
  Filled 2020-02-16 (×3): qty 1

## 2020-02-16 MED ORDER — FUROSEMIDE 10 MG/ML IJ SOLN
20.0000 mg | Freq: Two times a day (BID) | INTRAMUSCULAR | Status: DC
  Administered 2020-02-17 – 2020-02-18 (×3): 20 mg via INTRAVENOUS
  Filled 2020-02-16 (×3): qty 4

## 2020-02-16 MED ORDER — OXYBUTYNIN CHLORIDE 5 MG PO TABS
5.0000 mg | ORAL_TABLET | Freq: Every day | ORAL | Status: DC
  Administered 2020-02-16 – 2020-02-17 (×2): 5 mg via ORAL
  Filled 2020-02-16 (×4): qty 1

## 2020-02-16 MED ORDER — APIXABAN 5 MG PO TABS
5.0000 mg | ORAL_TABLET | Freq: Two times a day (BID) | ORAL | Status: DC
  Administered 2020-02-16 – 2020-02-18 (×5): 5 mg via ORAL
  Filled 2020-02-16 (×5): qty 1

## 2020-02-16 NOTE — ED Notes (Signed)
Patient transported to X-ray 

## 2020-02-16 NOTE — ED Triage Notes (Signed)
Pt from home via [facility]. Pt c/o "feeling funny" with facial numbness. Pt reports increased HR and BP. Pt has hx of AFIB and HTN, takes medication for both.  EMS VS: HR: 120, BP: 180/110  Pt is A&O x4. No c/o CP at this time.

## 2020-02-16 NOTE — H&P (Signed)
History and Physical    Allison Novak KZS:[id number] DOB: October 16, 1925 DOA: 02/16/2020  PCP: Adin Hector, MD   Patient coming from: Home  I have personally briefly reviewed patient's old medical records in [place]  Chief Complaint: Palpitations  HPI: Allison Novak is a 86 y.o. female with medical history significant for A. fib on Eliquis, HLD, TIA, HTN, carotid artery disease and aortic atherosclerosis, who presents to the emergency room with a several day history of palpitations, now associated with shortness of breath and lightheadedness. She denies chest pain, cough fever or chills, lower extremity pain or swelling. Patient was recently seen by Dr. Ubaldo Glassing on 9/21 and found to have borderline RVR 96-109 on Holter, after having on a prior visit discontinued digoxin due to elevated levels. She denies nausea, vomiting or diaphoresis. Denies headache, visual disturbance or weakness. Denies GI/GU symptoms ED Course: On arrival, heart rate 120, BP 180/110, O2 sat 84 to 88% on room air increasing to 94% on 2 L EKG as reviewed by me : A. fib, rate of 130 with no acute ST-T wave changes Blood work significant for mild anemia with hemoglobin of 11.7. Potassium 3.1, troponin VII, magnesium 2. BNP pending. Chest x-ray showedCardiomegaly with pulmonary edema and small bilateral pleural effusions as well as dilated pulmonary arteries which can be seen in patients with elevated pulmonary artery pressures. Patient was given a diltiazem bolus with minimal response and subsequently placed on an infusion. Hospitalist consulted for admission.  Review of Systems: As per HPI otherwise all other systems on review of systems negative.    Past Medical History:  Diagnosis Date  . A-fib ([place])   . Coronary artery disease   . Diverticulosis   . GERD (gastroesophageal reflux disease)   . Gout   . Hypertension   . Osteoporosis     Past Surgical History:  Procedure Laterality Date  . ABDOMINAL  HYSTERECTOMY    . ANKLE SURGERY Left 2011  . KNEE ARTHROSCOPY Right      reports that she has never smoked. She has never used smokeless tobacco. She reports that she does not drink alcohol and does not use drugs.  Allergies  Allergen Reactions  . Keflex [Cephalexin] Other (See Comments)    Pt cant remember exact reaction  . Macrodantin [Nitrofurantoin Macrocrystal] Other (See Comments)    Family History  Problem Relation Age of Onset  . CAD Mother   . Hypertension Sister       Prior to Admission medications   Medication Sig Start Date End Date Taking? Authorizing Provider  allopurinol (ZYLOPRIM) 100 MG tablet Take 100 mg by mouth daily.    [provider]  amLODipine (NORVASC) 2.5 MG tablet Take 2.5 mg by mouth daily. 12/13/16   [provider]  aspirin EC 81 MG EC tablet Take 1 tablet (81 mg total) by mouth daily. 06/24/15   Dustin Flock, MD  atorvastatin (LIPITOR) 40 MG tablet Take 40 mg by mouth daily. 11/18/16   [provider]  losartan (COZAAR) 100 MG tablet Take 1 tablet (100 mg total) by mouth daily. 06/24/15   Dustin Flock, MD  metoprolol succinate (TOPROL-XL) 25 MG 24 hr tablet Take 1 tablet (25 mg total) by mouth 2 (two) times daily. Patient not taking: Reported on 02/18/2017 06/24/15   Dustin Flock, MD  metoprolol succinate (TOPROL-XL) 50 MG 24 hr tablet Take 50 mg by mouth daily. 11/18/16   [provider]  omeprazole (PRILOSEC) 20 MG capsule  Take 20 mg by mouth daily.    [provider]  oxybutynin (DITROPAN) 5 MG tablet Take 5 mg by mouth at bedtime.    [provider]    Physical Exam: Vitals:   02/16/20 0130 02/16/20 0245 02/16/20 0300 02/16/20 0315  BP: (!) 182/116 131/85 (!) 157/103 136/73  Pulse: (!) 127 88 (!) 105 82  Resp: (!) 21 19 (!) 21 16  Temp:      TempSrc:      SpO2: (!) 89% 90% 92% 98%  Weight:      Height:         Vitals:   02/16/20 0130 02/16/20 0245 02/16/20 0300 02/16/20 0315    BP: (!) 182/116 131/85 (!) 157/103 136/73  Pulse: (!) 127 88 (!) 105 82  Resp: (!) 21 19 (!) 21 16  Temp:      TempSrc:      SpO2: (!) 89% 90% 92% 98%  Weight:      Height:          Constitutional: Alert and oriented x 3 .  Mild conversational dyspnea HEENT:      Head: Normocephalic and atraumatic.         Eyes: PERLA, EOMI, Conjunctivae are normal. Sclera is non-icteric.       Mouth/Throat: Mucous membranes are moist.       Neck: Supple with no signs of meningismus. Cardiovascular:  Irregular, tachycardic. No murmurs, gallops, or rubs. 2+ symmetrical distal pulses are present . No JVD. No LE edema Respiratory: Respiratory effort slightly increased.Lungs sounds clear bilaterally. No wheezes, crackles, or rhonchi.  Gastrointestinal: Soft, non tender, and non distended with positive bowel sounds. No rebound or guarding. Genitourinary: No CVA tenderness. Musculoskeletal: Nontender with normal range of motion in all extremities. No cyanosis, or erythema of extremities. Neurologic:  Face is symmetric. Moving all extremities. No gross focal neurologic deficits . Skin: Skin is warm, dry.  No rash or ulcers Psychiatric: Mood and affect are normal    Labs on Admission: I have personally reviewed following labs and imaging studies  CBC: Recent Labs  Lab 02/16/20 0127  WBC 8.4  NEUTROABS 6.2  HGB 11.7*  HCT 35.4*  MCV 92.9  PLT 326   Basic Metabolic Panel: Recent Labs  Lab 02/16/20 0127  NA 134*  K 3.1*  CL 98  CO2 23  GLUCOSE 152*  BUN 13  CREATININE 0.75  CALCIUM 9.0  MG 2.0   GFR: Estimated Creatinine Clearance: 40.2 mL/min (by C-G formula based on SCr of 0.75 mg/dL). Liver Function Tests: Recent Labs  Lab 02/16/20 0127  AST 51*  ALT 30  ALKPHOS 111  BILITOT 0.7  PROT 7.2  ALBUMIN 4.1   No results for input(s): LIPASE, AMYLASE in the last 168 hours. No results for input(s): AMMONIA in the last 168 hours. Coagulation Profile: No results for input(s):  INR, PROTIME in the last 168 hours. Cardiac Enzymes: No results for input(s): CKTOTAL, CKMB, CKMBINDEX, TROPONINI in the last 168 hours. BNP (last 3 results) No results for input(s): PROBNP in the last 8760 hours. HbA1C: No results for input(s): HGBA1C in the last 72 hours. CBG: No results for input(s): GLUCAP in the last 168 hours. Lipid Profile: No results for input(s): CHOL, HDL, LDLCALC, TRIG, CHOLHDL, LDLDIRECT in the last 72 hours. Thyroid Function Tests: No results for input(s): TSH, T4TOTAL, FREET4, T3FREE, THYROIDAB in the last 72 hours. Anemia Panel: No results for input(s): VITAMINB12, FOLATE, FERRITIN, TIBC, IRON, RETICCTPCT in the  last 72 hours. Urine analysis:    Component Value Date/Time   COLORURINE COLORLESS (A) 02/18/2017 1318   APPEARANCEUR CLEAR (A) 02/18/2017 1318   LABSPEC 1.002 (L) 02/18/2017 1318   PHURINE 7.0 02/18/2017 1318   GLUCOSEU NEGATIVE 02/18/2017 1318   HGBUR NEGATIVE 02/18/2017 1318   BILIRUBINUR NEGATIVE 02/18/2017 [place] 02/18/2017 1318   PROTEINUR NEGATIVE 02/18/2017 1318   NITRITE NEGATIVE 02/18/2017 1318   LEUKOCYTESUR NEGATIVE 02/18/2017 1318    Radiological Exams on Admission: DG Chest 2 View  Result Date: 02/16/2020 CLINICAL DATA:  AFib with RVR. EXAM: CHEST - 2 VIEW COMPARISON:  April 08, 2010 FINDINGS: There is cardiomegaly with Kerley B lines and prominent interstitial lung markings. There are small bilateral pleural effusions, right greater than left. There is no focal infiltrate. Aortic calcifications are noted. The pulmonary arteries appear dilated. IMPRESSION: 1. Cardiomegaly with pulmonary edema and small bilateral pleural effusions. 2. Dilated pulmonary arteries which can be seen in patients with elevated pulmonary artery pressures. Electronically Signed   By: Constance Holster M.D.   On: 02/16/2020 03:07     Assessment/Plan 86 year old female with history of A. fib on Eliquis, HLD, TIA, HTN, carotid  artery disease and aortic atherosclerosis, who presents with a several day history of palpitations, now associated with shortness of breath and lightheadedness.    Acute CHF (congestive heart failure) (HCC) Hypoxia -Patient with shortness of breath and hypoxia precipitated by respiratory. Chest x-ray with pulmonary edema and small bilateral pleural effusions -O2 sat 84 to 88% on room air improving to 92% with O2 at 2 L -Treat rapid A. fib -IV Lasix, continue home ARB. Hold beta-blocker for now as patient on diltiazem infusion -Daily weights with intake and output monitoring -Echocardiogram in the a.m.    Atrial fibrillation with RVR (HCC) Chronic anticoagulation -Heart rate in the 130s to 150s on arrival -Continue diltiazem infusion and transition to oral per protocol -Patient previously on metoprolol. Was on digoxin as outpatient discontinued due to elevated levels.  -Continue Eliquis      History of TIA (transient ischemic attack) -Continue aspirin and atorvastatin    Essential hypertension -Hold home amlodipine and metoprolol as patient on diltiazem and reintroduce as blood pressure will tolerate    Hyperlipidemia -Continue atorvastatin    Aortic atherosclerosis (HCC)   Carotid artery disease (HCC) -Continue aspirin and atorvastatin    DVT prophylaxis: Eliquis code Status: full code  Family Communication:  none  Disposition Plan: Back to previous home environment Consults called: Cardiology Status:At the time of admission, it appears that the appropriate admission status for this patient is INPATIENT. This is judged to be reasonable and necessary in order to provide the required intensity of service to ensure the patient's safety given the presenting symptoms, physical exam findings, and initial radiographic and laboratory data in the context of their  Comorbid conditions.   Patient requires inpatient status due to high intensity of service, high risk for further deterioration  and high frequency of surveillance required.   I certify that at the point of admission it is my clinical judgment that the patient will require inpatient hospital care spanning beyond [place] MD [facility]     02/16/2020, 3:39 AM

## 2020-02-16 NOTE — Plan of Care (Signed)

## 2020-02-16 NOTE — Consult Note (Signed)
[facility] Cardiology  CARDIOLOGY CONSULT NOTE  Patient ID: Allison Novak MRN: [id number] DOB/AGE: May 19, 1925 86 y.o.  Admit date: 02/16/2020 Referring Physician Damita Dunnings Primary Physician [facility] Primary Cardiologist Fath Reason for Consultation atrial fibrillation and congestive heart failure  HPI: 86 year old female referred for evaluation of atrial fibrillation and congestive heart failure.  The patient has a history of chronic atrial fibrillation on Eliquis for stroke prevention.  She presents with chief complaint of palpitations and elevated heart rate with shortness of breath.  ECG revealed atrial fibrillation with rapid ventricular rate of 130 bpm.  Chest x-ray revealed pulmonary edema with small bilateral pleural effusions.  Patient treated with Cardizem bolus and infusion with modest overall improvement.  Patient also treated with intravenous furosemide with diuresis and subsequent clinical improvement.  2D echocardiogram 03/07/2017 revealed normal left ventricular function, with LVEF greater than 55%.  Patient on metoprolol succinate 150 mg daily for rate control.  Digoxin was recently discontinued due to toxicity and elevated levels.  The patient reports her heart rate has been elevated since discontinuation of digoxin.  Review of systems complete and found to be negative unless listed above     Past Medical History:  Diagnosis Date  . A-fib ([place])   . Coronary artery disease   . Diverticulosis   . GERD (gastroesophageal reflux disease)   . Gout   . Hypertension   . Osteoporosis     Past Surgical History:  Procedure Laterality Date  . ABDOMINAL HYSTERECTOMY    . ANKLE SURGERY Left 2011  . KNEE ARTHROSCOPY Right     (Not in a hospital admission)  Social History   Socioeconomic History  . Marital status: Widowed    Spouse name: Not on file  . Number of children: Not on file  . Years of education: Not on file  . Highest education level: Not on file  Occupational History  .  Not on file  Tobacco Use  . Smoking status: Never Smoker  . Smokeless tobacco: Never Used  Substance and Sexual Activity  . Alcohol use: No    Alcohol/week: 0.0 standard drinks  . Drug use: No  . Sexual activity: Never  Other Topics Concern  . Not on file  Social History Narrative  . Not on file   Social Determinants of Health   Financial Resource Strain:   . Difficulty of Paying Living Expenses: Not on file  Food Insecurity:   . Worried About Charity fundraiser in the Last Year: Not on file  . Ran Out of Food in the Last Year: Not on file  Transportation Needs:   . Lack of Transportation (Medical): Not on file  . Lack of Transportation (Non-Medical): Not on file  Physical Activity:   . Days of Exercise per Week: Not on file  . Minutes of Exercise per Session: Not on file  Stress:   . Feeling of Stress : Not on file  Social Connections:   . Frequency of Communication with Friends and Family: Not on file  . Frequency of Social Gatherings with Friends and Family: Not on file  . Attends Religious Services: Not on file  . Active Member of Clubs or Organizations: Not on file  . Attends Archivist Meetings: Not on file  . Marital Status: Not on file  Intimate Partner Violence:   . Fear of Current or Ex-Partner: Not on file  . Emotionally Abused: Not on file  . Physically Abused: Not on file  . Sexually Abused: Not  on file    Family History  Problem Relation Age of Onset  . CAD Mother   . Hypertension Sister       Review of systems complete and found to be negative unless listed above      PHYSICAL EXAM  General: Well developed, well nourished, in no acute distress HEENT:  Normocephalic and atramatic Neck:  No JVD.  Lungs: Clear bilaterally to auscultation and percussion. Heart: HRRR . Normal S1 and S2 without gallops or murmurs.  Abdomen: Bowel sounds are positive, abdomen soft and non-tender  Msk:  Back normal, normal gait. Normal strength and tone  for age. Extremities: No clubbing, cyanosis or edema.   Neuro: Alert and oriented X 3. Psych:  Good affect, responds appropriately  Labs:   Lab Results  Component Value Date   WBC 8.4 02/16/2020   HGB 11.7 (L) 02/16/2020   HCT 35.4 (L) 02/16/2020   MCV 92.9 02/16/2020   PLT 185 02/16/2020    Recent Labs  Lab 02/16/20 0127  NA 134*  K 3.1*  CL 98  CO2 23  BUN 13  CREATININE 0.75  CALCIUM 9.0  PROT 7.2  BILITOT 0.7  ALKPHOS 111  ALT 30  AST 51*  GLUCOSE 152*   Lab Results  Component Value Date   TROPONINI <0.03 02/18/2017    Lab Results  Component Value Date   CHOL 186 02/19/2017   Lab Results  Component Value Date   HDL 50 02/19/2017   Lab Results  Component Value Date   LDLCALC 112 (H) 02/19/2017   Lab Results  Component Value Date   TRIG 118 02/19/2017   Lab Results  Component Value Date   CHOLHDL 3.7 02/19/2017   No results found for: LDLDIRECT    Radiology: DG Chest 2 View  Result Date: 02/16/2020 CLINICAL DATA:  AFib with RVR. EXAM: CHEST - 2 VIEW COMPARISON:  April 08, 2010 FINDINGS: There is cardiomegaly with Kerley B lines and prominent interstitial lung markings. There are small bilateral pleural effusions, right greater than left. There is no focal infiltrate. Aortic calcifications are noted. The pulmonary arteries appear dilated. IMPRESSION: 1. Cardiomegaly with pulmonary edema and small bilateral pleural effusions. 2. Dilated pulmonary arteries which can be seen in patients with elevated pulmonary artery pressures. Electronically Signed   By: Constance Holster M.D.   On: 02/16/2020 03:07    EKG: Atrial fibrillation at a rate of 130 bpm  ASSESSMENT AND PLAN:   1.  Atrial fibrillation with rapid ventricular rate, on Eliquis for stroke prevention, and metoprolol succinate for rate control.  Digoxin recently discontinued due to toxicity and elevated levels. 2.  Acute diastolic congestive heart failure, improved after diuresis with  intravenous furosemide 3.  Essential hypertension 4.  History of TIA  Recommendations  1.  Agree with overall current therapy 2.  Continue diuresis 3.  Carefully monitor renal status 4.  Continue Eliquis for stroke prevention 5.  Continue to hold digoxin 6.  Uptitrate metoprolol succinate to 200 mg daily 7.  Review 2D echocardiogram  Signed: Isaias Cowman MD,PhD, [facility] 02/16/2020, 9:52 AM

## 2020-02-16 NOTE — ED Notes (Signed)
Pt O2 sat decreased to 84-88% on RA. Pt O2 sat increased to 94% on 2L O2.  EDP aware and bedside to evaluate.

## 2020-02-16 NOTE — Progress Notes (Signed)
PROGRESS NOTE    Allison Novak  WJX:[id number] DOB: 06/22/1925 DOA: 02/16/2020 PCP: Adin Hector, MD    Assessment & Plan:   Principal Problem:   Acute CHF (congestive heart failure) ([place]) Active Problems:   History of TIA (transient ischemic attack)   Essential hypertension   Hyperlipidemia   Atrial fibrillation with RVR ([place])   Hypoxia   Chronic anticoagulation   Aortic atherosclerosis ([place])   Carotid artery disease (HCC)    Allison Novak is a 86 y.o. female with medical history significant for A. fib on Eliquis, HLD, TIA, HTN, carotid artery disease and aortic atherosclerosis, who presented to the emergency room with a several day history of palpitations, now associated with shortness of breath and lightheadedness.   Patient was recently seen by Dr. Ubaldo Glassing on 9/21 and found to have borderline RVR 96-109 on Holter, after having on a prior visit discontinued digoxin due to elevated levels.    Acute hypoxic respiratory failure 2/2 Pulm edema Acute on chronic diastolic CHF exacerbation --Chest x-ray with pulmonary edema and small bilateral pleural effusions.  O2 sat 84 to 88% on room air improving to 92% with O2 at 2 L.  CHF exacerbation likely precipitated by Afib w RVR. --TTE showed LVEF 45 to 50% with grade I diastolic dysfunction. PLAN: --continue IV lasix 20 mg BID -Treat rapid A. fib -Daily weights with intake and output monitoring  Atrial fibrillation with RVR (HCC) Chronic anticoagulation Acquired thrombophilia  --Heart rate in the 130s to 150s on arrival.  IV dilt 20 mg given f/b dilt gtt, with subsequent rate control. --cardiology consulted. PLAN: --resume home Toprol at increased dose of 200 mg daily, per cards. -Continue Eliquis --continue to hold digoxin, per cards.    Hypokalemia --likely due to diuresis --Monitor and replete PRN    History of TIA (transient ischemic attack) Continue aspirin and atorvastatin    Essential  hypertension --resume home Toprol at increased dose of 200 mg daily, per cards. --continue home Losartan --hold home amlodipine    Hyperlipidemia -Continue atorvastatin    Aortic atherosclerosis (HCC)   Carotid artery disease (HCC) -Continue aspirin and atorvastatin  Gout --continue home allopurinol  GERD --continue home PPI   DVT prophylaxis: OZ:[id number] Code Status: Full code  Family Communication: daughter updated at the bedside today Status is: inpatient Dispo:   The patient is from: home Anticipated d/c is to: home Anticipated d/c date is: 1-2 days Patient currently is not medically stable to d/c due to: pulm edema and hypoxia needing IV diuresis.   Subjective and Interval History:  Pt reported dyspnea improved.  Complained of right leg cramp.     Objective: Vitals:   02/16/20 1530 02/16/20 1600 02/16/20 1630 02/16/20 1656  BP: (!) 133/92 (!) 143/92 129/83 (!) 145/91  Pulse: 86 89 96 98  Resp: 19 17 19 20   Temp:    98.4 F (36.9 C)  TempSrc:      SpO2: 98% 97% 97% 99%  Weight:      Height:        Intake/Output Summary (Last 24 hours) at 02/16/2020 1709 Last data filed at 02/16/2020 0421 Gross per 24 hour  Intake --  Output 1100 ml  Net -1100 ml   Filed Weights   02/16/20 0123  Weight: 63.5 kg    Examination:   Constitutional: NAD, AAOx3 HEENT: conjunctivae and lids normal, EOMI CV: irregular, no M,R,G. Distal pulses +2.  No cyanosis.   RESP: CTA B/L, normal respiratory  effort  GI: +BS, NTND Extremities: No effusions, edema, or tenderness in BLE SKIN: warm, dry and intact Neuro: II - XII grossly intact.  Sensation intact Psych: Normal mood and affect.  Appropriate judgement and reason   Data Reviewed: I have personally reviewed following labs and imaging studies  CBC: Recent Labs  Lab 02/16/20 0127  WBC 8.4  NEUTROABS 6.2  HGB 11.7*  HCT 35.4*  MCV 92.9  PLT 347   Basic Metabolic Panel: Recent Labs  Lab 02/16/20 0127  NA  134*  K 3.1*  CL 98  CO2 23  GLUCOSE 152*  BUN 13  CREATININE 0.75  CALCIUM 9.0  MG 2.0   GFR: Estimated Creatinine Clearance: 40.2 mL/min (by C-G formula based on SCr of 0.75 mg/dL). Liver Function Tests: Recent Labs  Lab 02/16/20 0127  AST 51*  ALT 30  ALKPHOS 111  BILITOT 0.7  PROT 7.2  ALBUMIN 4.1   No results for input(s): LIPASE, AMYLASE in the last 168 hours. No results for input(s): AMMONIA in the last 168 hours. Coagulation Profile: No results for input(s): INR, PROTIME in the last 168 hours. Cardiac Enzymes: No results for input(s): CKTOTAL, CKMB, CKMBINDEX, TROPONINI in the last 168 hours. BNP (last 3 results) No results for input(s): PROBNP in the last 8760 hours. HbA1C: No results for input(s): HGBA1C in the last 72 hours. CBG: No results for input(s): GLUCAP in the last 168 hours. Lipid Profile: No results for input(s): CHOL, HDL, LDLCALC, TRIG, CHOLHDL, LDLDIRECT in the last 72 hours. Thyroid Function Tests: No results for input(s): TSH, T4TOTAL, FREET4, T3FREE, THYROIDAB in the last 72 hours. Anemia Panel: No results for input(s): VITAMINB12, FOLATE, FERRITIN, TIBC, IRON, RETICCTPCT in the last 72 hours. Sepsis Labs: No results for input(s): PROCALCITON, LATICACIDVEN in the last 168 hours.  Recent Results (from the past 240 hour(s))  Respiratory Panel by RT PCR (Flu A&B, Covid) - Nasopharyngeal Swab     Status: None   Collection Time: 02/16/20  2:05 AM   Specimen: Nasopharyngeal Swab  Result Value Ref Range Status   SARS Coronavirus 2 by RT PCR NEGATIVE NEGATIVE Final    Comment: (NOTE) SARS-CoV-2 target nucleic acids are NOT DETECTED.  The SARS-CoV-2 RNA is generally detectable in upper respiratoy specimens during the acute phase of infection. The lowest concentration of SARS-CoV-2 viral copies this assay can detect is 131 copies/mL. A negative result does not preclude SARS-Cov-2 infection and should not be used as the sole basis for treatment  or other patient management decisions. A negative result may occur with  improper specimen collection/handling, submission of specimen other than nasopharyngeal swab, presence of viral mutation(s) within the areas targeted by this assay, and inadequate number of viral copies (<131 copies/mL). A negative result must be combined with clinical observations, patient history, and epidemiological information. The expected result is Negative.  Fact Sheet for Patients:  PinkCheek.be  Fact Sheet for Healthcare Providers:  GravelBags.it  This test is no t yet approved or cleared by the [place] FDA and  has been authorized for detection and/or diagnosis of SARS-CoV-2 by FDA under an Emergency Use Authorization (EUA). This EUA will remain  in effect (meaning this test can be used) for the duration of the COVID-19 declaration under Section 564(b)(1) of the Act, 21 U.S.C. section 360bbb-3(b)(1), unless the authorization is terminated or revoked sooner.     Influenza A by PCR NEGATIVE NEGATIVE Final   Influenza B by PCR NEGATIVE NEGATIVE Final    Comment: (  NOTE) The Xpert Xpress SARS-CoV-2/FLU/RSV assay is intended as an aid in  the diagnosis of influenza from Nasopharyngeal swab specimens and  should not be used as a sole basis for treatment. Nasal washings and  aspirates are unacceptable for Xpert Xpress SARS-CoV-2/FLU/RSV  testing.  Fact Sheet for Patients: PinkCheek.be  Fact Sheet for Healthcare Providers: GravelBags.it  This test is not yet approved or cleared by the [place] FDA and  has been authorized for detection and/or diagnosis of SARS-CoV-2 by  FDA under an Emergency Use Authorization (EUA). This EUA will remain  in effect (meaning this test can be used) for the duration of the  Covid-19 declaration under Section 564(b)(1) of the Act, 21  U.S.C.  section 360bbb-3(b)(1), unless the authorization is  terminated or revoked. Performed at [facility], [address] [place], [place] [address]       Radiology Studies: DG Chest 2 View  Result Date: 02/16/2020 CLINICAL DATA:  AFib with RVR. EXAM: CHEST - 2 VIEW COMPARISON:  April 08, 2010 FINDINGS: There is cardiomegaly with Kerley B lines and prominent interstitial lung markings. There are small bilateral pleural effusions, right greater than left. There is no focal infiltrate. Aortic calcifications are noted. The pulmonary arteries appear dilated. IMPRESSION: 1. Cardiomegaly with pulmonary edema and small bilateral pleural effusions. 2. Dilated pulmonary arteries which can be seen in patients with elevated pulmonary artery pressures. Electronically Signed   By: Constance Holster M.D.   On: 02/16/2020 03:07   ECHOCARDIOGRAM COMPLETE  Result Date: 02/16/2020    ECHOCARDIOGRAM REPORT   Patient Name:   COLLEENA KURTENBACH Date of Exam: 02/16/2020 Medical Rec #:  [id number]       Height:       63.0 in Accession #:    [id number]      Weight:       140.0 lb Date of Birth:  1926/02/25       BSA:          1.662 m Patient Age:    7 years        BP:           159/94 mmHg Patient Gender: F               HR:           99 bpm. Exam Location:  [facility] Procedure: 2D Echo, Color Doppler and Cardiac Doppler Indications:     I50.31 CHF-Acute Diastolic  History:         Patient has no prior history of Echocardiogram examinations.                  CAD; Risk Factors:Hypertension.  Sonographer:     Charmayne Sheer RDCS (AE) Referring Phys:  [id number] Athena Masse Diagnosing Phys: Isaias Cowman MD  Sonographer Comments: Suboptimal subcostal window. IMPRESSIONS  1. Left ventricular ejection fraction, by estimation, is 45 to 50%. The left ventricle has mildly decreased function. The left ventricle has no regional wall motion abnormalities. Left ventricular diastolic parameters are consistent with Grade I diastolic  dysfunction (impaired relaxation).  2. Right ventricular systolic function is normal. The right ventricular size is normal.  3. Left atrial size was mildly dilated.  4. Right atrial size was moderately dilated.  5. The mitral valve is normal in structure. Mild to moderate mitral valve regurgitation. No evidence of mitral stenosis.  6. Tricuspid valve regurgitation is mild to moderate.  7. The aortic valve is normal in structure. Aortic valve  regurgitation is mild. No aortic stenosis is present.  8. The inferior vena cava is normal in size with greater than 50% respiratory variability, suggesting right atrial pressure of 3 mmHg. FINDINGS  Left Ventricle: Left ventricular ejection fraction, by estimation, is 45 to 50%. The left ventricle has mildly decreased function. The left ventricle has no regional wall motion abnormalities. The left ventricular internal cavity size was normal in size. There is no left ventricular hypertrophy. Left ventricular diastolic parameters are consistent with Grade I diastolic dysfunction (impaired relaxation). Right Ventricle: The right ventricular size is normal. No increase in right ventricular wall thickness. Right ventricular systolic function is normal. Left Atrium: Left atrial size was mildly dilated. Right Atrium: Right atrial size was moderately dilated. Pericardium: There is no evidence of pericardial effusion. Mitral Valve: The mitral valve is normal in structure. Mild to moderate mitral valve regurgitation. No evidence of mitral valve stenosis. MV peak gradient, 3.3 mmHg. The mean mitral valve gradient is 1.0 mmHg. Tricuspid Valve: The tricuspid valve is normal in structure. Tricuspid valve regurgitation is mild to moderate. No evidence of tricuspid stenosis. Aortic Valve: The aortic valve is normal in structure. Aortic valve regurgitation is mild. No aortic stenosis is present. Aortic valve mean gradient measures 2.0 mmHg. Aortic valve peak gradient measures 4.3 mmHg. Aortic  valve area, by VTI measures 1.45 cm. Pulmonic Valve: The pulmonic valve was normal in structure. Pulmonic valve regurgitation is not visualized. No evidence of pulmonic stenosis. Aorta: The aortic root is normal in size and structure. Venous: The inferior vena cava is normal in size with greater than 50% respiratory variability, suggesting right atrial pressure of 3 mmHg. IAS/Shunts: No atrial level shunt detected by color flow Doppler.  LEFT VENTRICLE PLAX 2D LVIDd:         4.65 cm  Diastology LVIDs:         3.04 cm  LV e' medial:    8.16 cm/s LV PW:         0.94 cm  LV E/e' medial:  10.8 LV IVS:        0.77 cm  LV e' lateral:   10.90 cm/s LVOT diam:     2.00 cm  LV E/e' lateral: 8.1 LV SV:         28 LV SV Index:   17 LVOT Area:     3.14 cm  RIGHT VENTRICLE RV Basal diam:  4.00 cm LEFT ATRIUM             Index       RIGHT ATRIUM           Index LA diam:        3.30 cm 1.99 cm/m  RA Area:     24.70 cm LA Vol (A2C):   35.5 ml 21.36 ml/m RA Volume:   85.40 ml  51.39 ml/m LA Vol (A4C):   47.7 ml 28.71 ml/m LA Biplane Vol: 44.4 ml 26.72 ml/m  AORTIC VALVE                   PULMONIC VALVE AV Area (Vmax):    1.87 cm    PV Vmax:       0.85 m/s AV Area (Vmean):   1.71 cm    PV Vmean:      58.300 cm/s AV Area (VTI):     1.45 cm    PV VTI:        0.132 m AV Vmax:  104.00 cm/s PV Peak grad:  2.9 mmHg AV Vmean:          71.700 cm/s PV Mean grad:  1.0 mmHg AV VTI:            0.194 m AV Peak Grad:      4.3 mmHg AV Mean Grad:      2.0 mmHg LVOT Vmax:         62.00 cm/s LVOT Vmean:        39.100 cm/s LVOT VTI:          0.090 m LVOT/AV VTI ratio: 0.46  AORTA Ao Root diam: 3.00 cm MITRAL VALVE               TRICUSPID VALVE MV Area (PHT): 4.24 cm    TR Peak grad:   22.1 mmHg MV Peak grad:  3.3 mmHg    TR Vmax:        235.00 cm/s MV Mean grad:  1.0 mmHg MV Vmax:       0.90 m/s    SHUNTS MV Vmean:      51.6 cm/s   Systemic VTI:  0.09 m MV Decel Time: 179 msec    Systemic Diam: 2.00 cm MV E velocity: 88.13 cm/s MV A  velocity: 89.10 cm/s MV E/A ratio:  0.99 Isaias Cowman MD Electronically signed by Isaias Cowman MD Signature Date/Time: 02/16/2020/2:55:12 PM    Final      Scheduled Meds: . allopurinol  100 mg Oral Daily  . apixaban  5 mg Oral BID  . atorvastatin  40 mg Oral Daily  . losartan  100 mg Oral Daily  . [START ON 02/17/2020] metoprolol succinate  200 mg Oral Daily  . oxybutynin  5 mg Oral QHS  . pantoprazole  40 mg Oral Daily   Continuous Infusions: . diltiazem (CARDIZEM) infusion Stopped (02/16/20 0413)  . diltiazem (CARDIZEM) infusion Stopped (02/16/20 1056)     LOS: 0 days     Enzo Bi, MD [facility] If 7PM-7AM, please contact night-coverage 02/16/2020, 5:09 PM

## 2020-02-16 NOTE — ED Provider Notes (Signed)
[facility] Emergency Department Provider Note ____________________________________________   First MD Initiated Contact with Patient 02/16/20 0114     (approximate)  I have reviewed the triage vital signs and the nursing notes.  HISTORY  Chief Complaint Palpitations and Numbness   HPI Allison Novak is a 86 y.o. femalewho presents to the ED for evaluation of shortness of breath.  Chart review indicates history of CAD, persistent A. fib on Eliquis twice daily, HTN, HLD. Previously on digoxin, discontinued due to elevated levels.  Currently rate controlled on 150 mg Toprol-XL   Last echo reportedly in October 2018 without valvular disease, normal LV function with mildly dilated left atrium.  Elevated RV pressures.  Patient presents to the ED with about 12 hours of palpitations, nausea and paresthesias.  She reports no preceding illnesses prior to this.  Patient reports her symptoms initiating with facial paresthesias while seated, developing substernal palpitations without pain/pressure as well as nausea without vomiting.  She reports the symptoms have been constant for the past 12 hours.  She reports compliance with her medications.   Patient denies any chest pain, chest pressure, back pain, abdominal pain, headache, syncope, cough.  She does indicate a degree of shortness of breath, and upon my reassessment reports this resolves after being rate controlled.    Past Medical History:  Diagnosis Date  . A-fib ([place])   . Coronary artery disease   . Diverticulosis   . GERD (gastroesophageal reflux disease)   . Gout   . Hypertension   . Osteoporosis     Patient Active Problem List   Diagnosis Date Noted  . Hyperlipidemia 02/16/2020  . Atrial fibrillation with RVR ([place]) 02/16/2020  . Hypoxia 02/16/2020  . Acute CHF (congestive heart failure) ([place]) 02/16/2020  . Chronic anticoagulation 02/16/2020  . History of TIA (transient ischemic attack) 02/23/2017    . Carotid artery disease ([place]) 02/23/2017  . TIA (transient ischemic attack) 02/18/2017  . GERD (gastroesophageal reflux disease) 02/18/2017  . Chest pain 06/23/2015  . HTN (hypertension), malignant 06/23/2015  . Essential hypertension 06/16/2015  . Aortic atherosclerosis ([place]) 06/16/2015    Past Surgical History:  Procedure Laterality Date  . ABDOMINAL HYSTERECTOMY    . ANKLE SURGERY Left 2011  . KNEE ARTHROSCOPY Right     Prior to Admission medications   Medication Sig Start Date End Date Taking? Authorizing Provider  allopurinol (ZYLOPRIM) 100 MG tablet Take 100 mg by mouth daily.    [provider]  amLODipine (NORVASC) 2.5 MG tablet Take 2.5 mg by mouth daily. 12/13/16   [provider]  aspirin EC 81 MG EC tablet Take 1 tablet (81 mg total) by mouth daily. 06/24/15   Dustin Flock, MD  atorvastatin (LIPITOR) 40 MG tablet Take 40 mg by mouth daily. 11/18/16   [provider]  losartan (COZAAR) 100 MG tablet Take 1 tablet (100 mg total) by mouth daily. 06/24/15   Dustin Flock, MD  metoprolol succinate (TOPROL-XL) 25 MG 24 hr tablet Take 1 tablet (25 mg total) by mouth 2 (two) times daily. Patient not taking: Reported on 02/18/2017 06/24/15   Dustin Flock, MD  metoprolol succinate (TOPROL-XL) 50 MG 24 hr tablet Take 50 mg by mouth daily. 11/18/16   [provider]  omeprazole (PRILOSEC) 20 MG capsule Take 20 mg by mouth daily.    [provider]  oxybutynin (DITROPAN) 5 MG tablet Take 5 mg by mouth at bedtime.    [provider]  Allergies Keflex [cephalexin] and Macrodantin [nitrofurantoin macrocrystal]  Family History  Problem Relation Age of Onset  . CAD Mother   . Hypertension Sister     Social History Social History   Tobacco Use  . Smoking status: Never Smoker  . Smokeless tobacco: Never Used  Substance Use Topics  . Alcohol use: No    Alcohol/week: 0.0 standard drinks  . Drug use: No    Review of  Systems  Constitutional: No fever/chills Eyes: No visual changes. ENT: No sore throat. Cardiovascular: Denies chest pain. Respiratory: Positive shortness of breath.  Negative for cough. Gastrointestinal: No abdominal pain.  no vomiting.  No diarrhea.  No constipation. Positive for nausea Genitourinary: Negative for dysuria. Musculoskeletal: Negative for back pain. Skin: Negative for rash. Neurological: Negative for headaches, focal weakness or numbness.  Positive for paresthesias  ____________________________________________   PHYSICAL EXAM:  VITAL SIGNS: Vitals:   02/16/20 0345 02/16/20 0400  BP: 123/79 121/82  Pulse:  77  Resp: 18 18  Temp:    SpO2: 97% 91%      Constitutional: Alert and oriented. Well appearing and in no acute distress. Eyes: Conjunctivae are normal. PERRL. EOMI. Head: Atraumatic. Nose: No congestion/rhinnorhea. Mouth/Throat: Mucous membranes are moist.  Oropharynx non-erythematous. Neck: No stridor. No cervical spine tenderness to palpation. Cardiovascular: Tachycardic and irregular. Grossly normal heart sounds.  Good peripheral circulation. Respiratory: Normal respiratory effort.  No retractions. Lungs CTAB. Gastrointestinal: Soft , nondistended, nontender to palpation. No abdominal bruits. No CVA tenderness. Musculoskeletal: No lower extremity tenderness.  No joint effusions. No signs of acute trauma. Mild pitting edema bilaterally to lower extremities up to mid shin.  No overlying skin change. Neurologic:  Normal speech and language. No gross focal neurologic deficits are appreciated. No gait instability noted. Skin:  Skin is warm, dry and intact. No rash noted. Psychiatric: Mood and affect are normal. Speech and behavior are normal.  ____________________________________________   LABS (all labs ordered are listed, but only abnormal results are displayed)  Labs Reviewed  CBC WITH DIFFERENTIAL/PLATELET - Abnormal; Notable for the following  components:      Result Value   RBC 3.81 (*)    Hemoglobin 11.7 (*)    HCT 35.4 (*)    All other components within normal limits  COMPREHENSIVE METABOLIC PANEL - Abnormal; Notable for the following components:   Sodium 134 (*)    Potassium 3.1 (*)    Glucose, Bld 152 (*)    AST 51 (*)    All other components within normal limits  RESPIRATORY PANEL BY RT PCR (FLU A&B, COVID)  MAGNESIUM  BRAIN NATRIURETIC PEPTIDE  TROPONIN I (HIGH SENSITIVITY)  TROPONIN I (HIGH SENSITIVITY)   ____________________________________________  12 Lead EKG A. fib, rate of 130 bpm.  Normal axis.  QTC of 514 ms, otherwise normal intervals.  No evidence of acute ischemia.  ____________________________________________  RADIOLOGY  ED MD interpretation: CXR reviewed with evidence of cardiomegaly and pulmonary vascular congestion.  Official radiology report(s): DG Chest 2 View  Result Date: 02/16/2020 CLINICAL DATA:  AFib with RVR. EXAM: CHEST - 2 VIEW COMPARISON:  April 08, 2010 FINDINGS: There is cardiomegaly with Kerley B lines and prominent interstitial lung markings. There are small bilateral pleural effusions, right greater than left. There is no focal infiltrate. Aortic calcifications are noted. The pulmonary arteries appear dilated. IMPRESSION: 1. Cardiomegaly with pulmonary edema and small bilateral pleural effusions. 2. Dilated pulmonary arteries which can be seen in patients with elevated pulmonary artery pressures. Electronically Signed  By: Constance Holster M.D.   On: 02/16/2020 03:07   ___________________________________________   PROCEDURES and INTERVENTIONS  Procedure(s) performed (including Critical Care):  Procedures .1-3 Lead EKG Interpretation Performed by: Vladimir Crofts, MD Authorized by: Vladimir Crofts, MD     Interpretation: abnormal     ECG rate:  122   ECG rate assessment: tachycardic     Rhythm: atrial fibrillation     Ectopy: none     Conduction: normal   .Critical  Care Performed by: Vladimir Crofts, MD Authorized by: Vladimir Crofts, MD   Critical care provider statement:    Critical care time (minutes):  45   Critical care was necessary to treat or prevent imminent or life-threatening deterioration of the following conditions:  Circulatory failure, cardiac failure and respiratory failure   Critical care was time spent personally by me on the following activities:  Discussions with consultants, evaluation of patient's response to treatment, examination of patient, ordering and performing treatments and interventions, ordering and review of laboratory studies, ordering and review of radiographic studies, pulse oximetry, re-evaluation of patient's condition, obtaining history from patient or surrogate and review of old charts     Medications  diltiazem (CARDIZEM) 125 mg in dextrose 5% 125 mL (1 mg/mL) infusion (0 mg/hr Intravenous Hold 02/16/20 0413)  losartan (COZAAR) tablet 100 mg (has no administration in time range)  atorvastatin (LIPITOR) tablet 40 mg (has no administration in time range)  acetaminophen (TYLENOL) tablet 650 mg (has no administration in time range)  ondansetron (ZOFRAN) injection 4 mg (has no administration in time range)  apixaban (ELIQUIS) tablet 2.5 mg (has no administration in time range)  diltiazem (CARDIZEM) 125 mg in dextrose 5% 125 mL (1 mg/mL) infusion (has no administration in time range)  ALPRAZolam (XANAX) tablet 0.25 mg (has no administration in time range)  furosemide (LASIX) injection 20 mg (has no administration in time range)  diltiazem (CARDIZEM) injection 20 mg (20 mg Intravenous Given 02/16/20 0213)  potassium chloride (KLOR-CON) packet 40 mEq (40 mEq Oral Given 02/16/20 0256)    ____________________________________________   MDM / ED COURSE  Rather healthy and functional 86 year old woman presents to the ED with palpitations, with evidence of A. fib with RVR precipitated by new onset CHF requiring medical  admission.  Patient hypoxic to the upper 80s on room air requiring nasal cannula, otherwise hemodynamically stable.  She presents with rates 120s-130s in A. fib with RVR.  Her EKG is nonischemic and troponin is low.  Blood work demonstrates hypokalemia, that was repleted orally.  Patient tolerates single dose of IV diltiazem well with resolution of her RVR, and improvement of her symptoms.  Her heart rate eventually rebounds, increasing to over 100 necessitating diltiazem drip.  Chart review indicates no echo for about 3 years.  Will admit the patient to hospitalist medicine for further work-up and management of likely new onset CHF precipitating A. fib with RVR.  Clinical Course as of Feb 16 436  Sun Feb 16, 2020  0232 Patient tolerated diltiazem well with improvement of rapid ventricular rates, heart rate now in the 70s, BP 120/60   [DS]  0314 Reassessed.  Patient reports improving symptoms with improving heart rates.  CXR noted with evidence of volume overload, BNP added to her work-up.  Concerned about CHF exacerbation precipitating A. fib with RVR   [DS]    Clinical Course User Index [DS] Vladimir Crofts, MD     ____________________________________________   FINAL CLINICAL IMPRESSION(S) / ED DIAGNOSES  Final diagnoses:  Palpitations  Atrial fibrillation with RVR (HCC)  Acute congestive heart failure, unspecified heart failure type ([place])  Hypoxia  Hypokalemia     ED Discharge Orders         Ordered    Amb referral to AFIB Clinic        02/16/20 [place]   Note:  This document was prepared using Dragon voice recognition software and may include unintentional dictation errors.   Vladimir Crofts, MD 02/16/20 [phone]

## 2020-02-16 NOTE — Progress Notes (Signed)
*  PRELIMINARY RESULTS* Echocardiogram 2D Echocardiogram has been performed.  Allison Novak 02/16/2020, 1:35 PM

## 2020-02-16 NOTE — ED Notes (Signed)
Secretary notified for transport   

## 2020-02-17 DIAGNOSIS — Z8673 Personal history of transient ischemic attack (TIA), and cerebral infarction without residual deficits: Secondary | ICD-10-CM

## 2020-02-17 DIAGNOSIS — Z7901 Long term (current) use of anticoagulants: Secondary | ICD-10-CM

## 2020-02-17 DIAGNOSIS — I10 Essential (primary) hypertension: Secondary | ICD-10-CM

## 2020-02-17 DIAGNOSIS — E785 Hyperlipidemia, unspecified: Secondary | ICD-10-CM

## 2020-02-17 LAB — CBC
HCT: 35 % — ABNORMAL LOW (ref 36.0–46.0)
Hemoglobin: 11.8 g/dL — ABNORMAL LOW (ref 12.0–15.0)
MCH: 30.7 pg (ref 26.0–34.0)
MCHC: 33.7 g/dL (ref 30.0–36.0)
MCV: 91.1 fL (ref 80.0–100.0)
Platelets: 183 10*3/uL (ref 150–400)
RBC: 3.84 MIL/uL — ABNORMAL LOW (ref 3.87–5.11)
RDW: 14 % (ref 11.5–15.5)
WBC: 7.8 10*3/uL (ref 4.0–10.5)
nRBC: 0 % (ref 0.0–0.2)

## 2020-02-17 LAB — BASIC METABOLIC PANEL
Anion gap: 10 (ref 5–15)
BUN: 18 mg/dL (ref 8–23)
CO2: 28 mmol/L (ref 22–32)
Calcium: 8.9 mg/dL (ref 8.9–10.3)
Chloride: 96 mmol/L — ABNORMAL LOW (ref 98–111)
Creatinine, Ser: 1.11 mg/dL — ABNORMAL HIGH (ref 0.44–1.00)
GFR, Estimated: 43 mL/min — ABNORMAL LOW (ref >60)
Glucose, Bld: 105 mg/dL — ABNORMAL HIGH (ref 70–99)
Potassium: 3.7 mmol/L (ref 3.5–5.1)
Sodium: 134 mmol/L — ABNORMAL LOW (ref 135–145)

## 2020-02-17 LAB — MAGNESIUM: Magnesium: 1.9 mg/dL (ref 1.7–2.4)

## 2020-02-17 MED ORDER — ASPIRIN EC 81 MG PO TBEC
81.0000 mg | DELAYED_RELEASE_TABLET | Freq: Every day | ORAL | Status: DC
  Administered 2020-02-17 – 2020-02-18 (×2): 81 mg via ORAL
  Filled 2020-02-17 (×3): qty 1

## 2020-02-17 NOTE — Progress Notes (Addendum)
  Heart Failure Nurse Navigator Note  [id number]%, Grade I Diastolic Dysfunction, mild LAE, mod RAE, mild to moderate MR, mild to moderate TR and mild AI.  She presented to the ED with complaints of palpitations, numbness, SOB, nausea and saturations of 80% on room air.   Co morbidities:    Hypertension Hyperlipidemia Persistent A fib  Medications:  Eliquis 5 mg BID Lipitor 40 mg daily Lasix 20 mg BID IV Losartan 100 mg daily Metoprolol succinate 100 mg daily  Labs:  Sodium 134, potassium 3.7, BUN 18, creatinine 1.11   Assessment:  General- is awake and alert lying bed, no acute distress.  HEENT-pupils equal, no JVD noted  Cardiac- heart tones irregular.  Chest- breath sounds clear.  Gi- abdomen soft, non tender  Musculoskeletal- no lower extremity edema noted.  Neuro- moves all extremities without difficulty, speech is clear.  Psych is pleasant and appropriate.   Discussed with patient weighing daily and recording, when to report weight gain to doctor.  Also discussed 2000 mg sodium restrictation.   She states there are times when she likes to have peanut butter and crackers. Also like ice cream.  Meats that she eats includes chicken,pork rarely will have bacon.   Discussed reading labels.  She states at 4 I should get to eat what I want. Explained that is a fine line between eating what you want and ending up in the hospital with heart failure.  Discussed HF zone magnet and teaching booklet.   Pricilla Riffle RN, CHFN

## 2020-02-17 NOTE — Progress Notes (Signed)
[facility]    SUBJECTIVE: The patient reports feeling well this morning. She denies chest pain, shortness of breath or recurrent palpitations. She reports that her swelling is better.   Vitals:   02/16/20 1656 02/17/20 0127 02/17/20 0404 02/17/20 0811  BP: (!) 145/91 126/86  125/81  Pulse: 98 (!) 101  93  Resp: 20 18  18   Temp: 98.4 F (36.9 C) 97.7 F (36.5 C)  97.6 F (36.4 C)  TempSrc:  Oral  Oral  SpO2: 99% 99%  97%  Weight:   65.6 kg   Height:         Intake/Output Summary (Last 24 hours) at 02/17/2020 0900 Last data filed at 02/16/2020 2200 Gross per 24 hour  Intake --  Output 1000 ml  Net -1000 ml      PHYSICAL EXAM  General: Well developed, well nourished, in no acute distress, sitting up in bed eating breakfast HEENT:  Normocephalic and atramatic Neck:  No JVD.  Lungs: Clear bilaterally to auscultation, normal effort of breathing on supplemental O2 via [place] Heart: irregularly irregular, no murmurs  Abdomen: nondistended  Msk:  Back normal, gait not assessed. Normal strength and tone for age. Extremities: No clubbing, cyanosis or edema.   Neuro: Alert and oriented X 3. Psych:  Good affect, responds appropriately   LABS: Basic Metabolic Panel: Recent Labs    02/16/20 2227 02/17/20 0300  NA 133* 134*  K 4.0 3.7  CL 94* 96*  CO2 28 28  GLUCOSE 115* 105*  BUN 17 18  CREATININE 1.14* 1.11*  CALCIUM 9.1 8.9  MG 2.0 1.9   Liver Function Tests: Recent Labs    02/16/20 0127  AST 51*  ALT 30  ALKPHOS 111  BILITOT 0.7  PROT 7.2  ALBUMIN 4.1   No results for input(s): LIPASE, AMYLASE in the last 72 hours. CBC: Recent Labs    02/16/20 0127 02/17/20 0300  WBC 8.4 7.8  NEUTROABS 6.2  --   HGB 11.7* 11.8*  HCT 35.4* 35.0*  MCV 92.9 91.1  PLT 185 183   Cardiac Enzymes: No results for input(s): CKTOTAL, CKMB, CKMBINDEX, TROPONINI in the last 72 hours. BNP: Invalid input(s): POCBNP D-Dimer: No results for input(s): DDIMER in the last  72 hours. Hemoglobin A1C: No results for input(s): HGBA1C in the last 72 hours. Fasting Lipid Panel: No results for input(s): CHOL, HDL, LDLCALC, TRIG, CHOLHDL, LDLDIRECT in the last 72 hours. Thyroid Function Tests: No results for input(s): TSH, T4TOTAL, T3FREE, THYROIDAB in the last 72 hours.  Invalid input(s): FREET3 Anemia Panel: No results for input(s): VITAMINB12, FOLATE, FERRITIN, TIBC, IRON, RETICCTPCT in the last 72 hours.  DG Chest 2 View  Result Date: 02/16/2020 CLINICAL DATA:  AFib with RVR. EXAM: CHEST - 2 VIEW COMPARISON:  April 08, 2010 FINDINGS: There is cardiomegaly with Kerley B lines and prominent interstitial lung markings. There are small bilateral pleural effusions, right greater than left. There is no focal infiltrate. Aortic calcifications are noted. The pulmonary arteries appear dilated. IMPRESSION: 1. Cardiomegaly with pulmonary edema and small bilateral pleural effusions. 2. Dilated pulmonary arteries which can be seen in patients with elevated pulmonary artery pressures. Electronically Signed   By: Constance Holster M.D.   On: 02/16/2020 03:07   ECHOCARDIOGRAM COMPLETE  Result Date: 02/16/2020    ECHOCARDIOGRAM REPORT   Patient Name:   Allison Novak Date of Exam: 02/16/2020 Medical Rec #:  [id number]       Height:  63.0 in Accession #:    [id number]      Weight:       140.0 lb Date of Birth:  [birth year]       BSA:          1.662 m Patient Age:    86 years        BP:           159/94 mmHg Patient Gender: F               HR:           99 bpm. Exam Location:  [facility] Procedure: 2D Echo, Color Doppler and Cardiac Doppler Indications:     I50.31 CHF-Acute Diastolic  History:         Patient has no prior history of Echocardiogram examinations.                  CAD; Risk Factors:Hypertension.  Sonographer:     Charmayne Sheer RDCS (AE) Referring Phys:  [id number] Athena Masse Diagnosing Phys: Isaias Cowman MD  Sonographer Comments: Suboptimal subcostal window.  IMPRESSIONS  1. Left ventricular ejection fraction, by estimation, is 45 to 50%. The left ventricle has mildly decreased function. The left ventricle has no regional wall motion abnormalities. Left ventricular diastolic parameters are consistent with Grade I diastolic dysfunction (impaired relaxation).  2. Right ventricular systolic function is normal. The right ventricular size is normal.  3. Left atrial size was mildly dilated.  4. Right atrial size was moderately dilated.  5. The mitral valve is normal in structure. Mild to moderate mitral valve regurgitation. No evidence of mitral stenosis.  6. Tricuspid valve regurgitation is mild to moderate.  7. The aortic valve is normal in structure. Aortic valve regurgitation is mild. No aortic stenosis is present.  8. The inferior vena cava is normal in size with greater than 50% respiratory variability, suggesting right atrial pressure of 3 mmHg. FINDINGS  Left Ventricle: Left ventricular ejection fraction, by estimation, is 45 to 50%. The left ventricle has mildly decreased function. The left ventricle has no regional wall motion abnormalities. The left ventricular internal cavity size was normal in size. There is no left ventricular hypertrophy. Left ventricular diastolic parameters are consistent with Grade I diastolic dysfunction (impaired relaxation). Right Ventricle: The right ventricular size is normal. No increase in right ventricular wall thickness. Right ventricular systolic function is normal. Left Atrium: Left atrial size was mildly dilated. Right Atrium: Right atrial size was moderately dilated. Pericardium: There is no evidence of pericardial effusion. Mitral Valve: The mitral valve is normal in structure. Mild to moderate mitral valve regurgitation. No evidence of mitral valve stenosis. MV peak gradient, 3.3 mmHg. The mean mitral valve gradient is 1.0 mmHg. Tricuspid Valve: The tricuspid valve is normal in structure. Tricuspid valve regurgitation is mild  to moderate. No evidence of tricuspid stenosis. Aortic Valve: The aortic valve is normal in structure. Aortic valve regurgitation is mild. No aortic stenosis is present. Aortic valve mean gradient measures 2.0 mmHg. Aortic valve peak gradient measures 4.3 mmHg. Aortic valve area, by VTI measures 1.45 cm. Pulmonic Valve: The pulmonic valve was normal in structure. Pulmonic valve regurgitation is not visualized. No evidence of pulmonic stenosis. Aorta: The aortic root is normal in size and structure. Venous: The inferior vena cava is normal in size with greater than 50% respiratory variability, suggesting right atrial pressure of 3 mmHg. IAS/Shunts: No atrial level shunt detected by color flow Doppler.  LEFT VENTRICLE PLAX 2D  LVIDd:         4.65 cm  Diastology LVIDs:         3.04 cm  LV e' medial:    8.16 cm/s LV PW:         0.94 cm  LV E/e' medial:  10.8 LV IVS:        0.77 cm  LV e' lateral:   10.90 cm/s LVOT diam:     2.00 cm  LV E/e' lateral: 8.1 LV SV:         28 LV SV Index:   17 LVOT Area:     3.14 cm  RIGHT VENTRICLE RV Basal diam:  4.00 cm LEFT ATRIUM             Index       RIGHT ATRIUM           Index LA diam:        3.30 cm 1.99 cm/m  RA Area:     24.70 cm LA Vol (A2C):   35.5 ml 21.36 ml/m RA Volume:   85.40 ml  51.39 ml/m LA Vol (A4C):   47.7 ml 28.71 ml/m LA Biplane Vol: 44.4 ml 26.72 ml/m  AORTIC VALVE                   PULMONIC VALVE AV Area (Vmax):    1.87 cm    PV Vmax:       0.85 m/s AV Area (Vmean):   1.71 cm    PV Vmean:      58.300 cm/s AV Area (VTI):     1.45 cm    PV VTI:        0.132 m AV Vmax:           104.00 cm/s PV Peak grad:  2.9 mmHg AV Vmean:          71.700 cm/s PV Mean grad:  1.0 mmHg AV VTI:            0.194 m AV Peak Grad:      4.3 mmHg AV Mean Grad:      2.0 mmHg LVOT Vmax:         62.00 cm/s LVOT Vmean:        39.100 cm/s LVOT VTI:          0.090 m LVOT/AV VTI ratio: 0.46  AORTA Ao Root diam: 3.00 cm MITRAL VALVE               TRICUSPID VALVE MV Area (PHT): 4.24 cm     TR Peak grad:   22.1 mmHg MV Peak grad:  3.3 mmHg    TR Vmax:        235.00 cm/s MV Mean grad:  1.0 mmHg MV Vmax:       0.90 m/s    SHUNTS MV Vmean:      51.6 cm/s   Systemic VTI:  0.09 m MV Decel Time: 179 msec    Systemic Diam: 2.00 cm MV E velocity: 88.13 cm/s MV A velocity: 89.10 cm/s MV E/A ratio:  0.99 Isaias Cowman MD Electronically signed by Isaias Cowman MD Signature Date/Time: 02/16/2020/2:55:12 PM    Final      Echo LVEF 45-50%  TELEMETRY: atrial fibrillation, rates 96-103 bpm  ASSESSMENT AND PLAN:  Principal Problem:   Acute CHF (congestive heart failure) (HCC) Active Problems:   History of TIA (transient ischemic attack)   Essential hypertension   Hyperlipidemia   Atrial fibrillation with  RVR ([place])   Hypoxia   Chronic anticoagulation   Aortic atherosclerosis (HCC)   Carotid artery disease ([place])    1.  Atrial fibrillation with rapid ventricular rate, on Eliquis for stroke prevention, and metoprolol succinate for rate control.  Digoxin recently discontinued due to toxicity and elevated levels. Heart rate better controlled on increased dose of metoprolol succinate to 200 mg daily. 2.  Acute diastolic congestive heart failure, improved after diuresis with intravenous furosemide 3.  Essential hypertension, stable on metoprolol, losartan and Lasix 4.  History of TIA  Recommendations: 1. Continue Eliquis 5 mg BID for stroke prevention 2. Continue IV Lasix 20 mg BID with careful monitoring of renal status, I&Os, daily weights 3. Continue to hold digoxin 4. Continue metoprolol succinate 200 mg daily 5. Recommend ambulating today, monitoring oxygen need and heart rate response   Clabe Seal, PA-C 02/17/2020 9:00 AM

## 2020-02-17 NOTE — Evaluation (Signed)
Occupational Therapy Evaluation Patient Details Name: Allison Novak MRN: [id number] DOB: Nov 20, 1925 Today's Date: 02/17/2020    History of Present Illness 86 y.o. female with medical history significant for A. fib on Eliquis, HLD, TIA, HTN, carotid artery disease and aortic atherosclerosis, who presented to the emergency room with a several day history of palpitations, now associated with shortness of breath and lightheadedness. Pt denies SOB, lightheadedness at time of OT Evaluation.   Clinical Impression   Pt was seen for OT evaluation this date. Prior to hospital admission, pt was living alone with adult children nearby who come to check on her frequently. Pt indep with ADL and ADL mobility, except using SPC at night for toileting. Pt still driving and independent with meal prep, housekeeping tasks, and driving, per pt report. Pt denies pain, SOB, and lightheadedness during session. Pt noted with linens wet, purewick displaced. Nurse tech assisted in changing linens while pt up with therapist using RW. Pt required supervision for bed mobility, CGA for ADL transfers, supervision for LB and UB dressing, CGA for toilet transfer (regular height toilet with pt using R grab bar; has comfort height toilet at home). Pt did note some discomfort in L ankle from hx of ankle fracture in past, stating, "it lets me know it's there." Pt endorsed preference to wear shoes but did not have them with her to wear. RW utilized for safety this session. Currently pt demonstrates impairments as described below (See OT problem list) which functionally limit her ability to perform ADL/self-care tasks at baseline independence. Pt would benefit from skilled OT services to address noted impairments and functional limitations (see below for any additional details) in order to maximize safety and independence while minimizing falls risk and caregiver burden with emphasis on energy conservation and falls prevention. Upon hospital  discharge, do not anticipate need for skilled OT services.     Follow Up Recommendations  No OT follow up;Supervision - Intermittent    Equipment Recommendations  None recommended by OT    Recommendations for Other Services       Precautions / Restrictions Precautions Precautions: Fall Precaution Comments: monitor HR Restrictions Weight Bearing Restrictions: No      Mobility Bed Mobility Overal bed mobility: Modified Independent                Transfers Overall transfer level: Needs assistance Equipment used: Rolling walker (2 wheeled) Transfers: Sit to/from Stand Sit to Stand: Min guard         General transfer comment: cues for hand/foot placement    Balance Overall balance assessment: Needs assistance;History of Falls Sitting-balance support: No upper extremity supported;Feet supported Sitting balance-Leahy Scale: Good     Standing balance support: No upper extremity supported;During functional activity Standing balance-Leahy Scale: Fair Standing balance comment: able to stand at sink to wash hands without UE support, able to reach outside BOS for soap dispenser without LOB, pt does endorse feeling unsteady                           ADL either performed or assessed with clinical judgement   ADL Overall ADL's : Needs assistance/impaired Eating/Feeding: Independent   Grooming: Wash/dry hands;Supervision/safety;Standing   Upper Body Bathing: Sitting;Set up;Supervision/ safety   Lower Body Bathing: Sitting/lateral leans;Set up;Supervison/ safety   Upper Body Dressing : Sitting;Supervision/safety;Set up   Lower Body Dressing: Set up;Sit to/from stand;Min guard Lower Body Dressing Details (indicate cue type and reason): CGA for ADL transfer  portion of task Toilet Transfer: Min guard;Regular Toilet;Grab bars;Ambulation;RW Toilet Transfer Details (indicate cue type and reason): grab bar utilized for stability (pt has comfort height toilet at  home) Toileting- Water quality scientist and Hygiene: Modified independent       Functional mobility during ADLs: Min guard;Rolling walker       Vision Baseline Vision/History: Wears glasses Wears Glasses: At all times Patient Visual Report: No change from baseline Vision Assessment?: No apparent visual deficits     Perception     Praxis      Pertinent Vitals/Pain Pain Assessment: No/denies pain     Hand Dominance Right   Extremity/Trunk Assessment Upper Extremity Assessment Upper Extremity Assessment: Overall WFL for tasks assessed;Generalized weakness (WFL for age)   Lower Extremity Assessment Lower Extremity Assessment: Overall WFL for tasks assessed;Generalized weakness (WFL for age; endorses hx of L broken ankle, still a little stiff)   Cervical / Trunk Assessment Cervical / Trunk Assessment: Normal   Communication Communication Communication: No difficulties   Cognition Arousal/Alertness: Awake/alert Behavior During Therapy: WFL for tasks assessed/performed Overall Cognitive Status: Within Functional Limits for tasks assessed                                     General Comments       Exercises Other Exercises Other Exercises: LB dressing EOB, toileting in bathroom, washed hands at sink, ADL mobility with RW requiring occasional cues for technique/safety; In A-fib throughout per telemetry box, pt denied SOB/lightheadedness throughout   Shoulder Instructions      Home Living Family/patient expects to be discharged to:: Private residence Living Arrangements: Alone Available Help at Discharge: Family;Available PRN/intermittently (daughter and son available and check on pt) Type of Home: House Home Access: Ramped entrance     Home Layout: Able to live on main level with bedroom/bathroom     Bathroom Shower/Tub: Tub/shower unit   Bathroom Toilet: Handicapped height     Home Equipment: Environmental consultant - 2 wheels;Cane - single point;Grab bars -  tub/shower;Wheelchair - manual          Prior Functioning/Environment Level of Independence: Independent with assistive device(s)        Comments: Pt reports generally independent with ADL and IADL, uses a SPC at night for toileting, still drives, 1 fall in past 12 months (came in from yard picking up sticks and passed out in her kitchen).        OT Problem List: Decreased strength;Impaired balance (sitting and/or standing);Decreased knowledge of use of DME or AE;Cardiopulmonary status limiting activity;Decreased activity tolerance      OT Treatment/Interventions: Self-care/ADL training;Therapeutic exercise;Therapeutic activities;DME and/or AE instruction;Patient/family education;Energy conservation;Balance training    OT Goals(Current goals can be found in the care plan section) Acute Rehab OT Goals Patient Stated Goal: go home OT Goal Formulation: With patient Time For Goal Achievement: 03/02/20 Potential to Achieve Goals: Good ADL Goals Pt Will Transfer to Toilet: with modified independence;ambulating;regular height toilet (LRAD for amb) Additional ADL Goal #1: Pt will verbalize plan to implement at least 1 learned falls prevention strategy. Additional ADL Goal #2: Pt will perform morning ADL routine with supervision for safety and VSS throughout.  OT Frequency: Min 1X/week   Barriers to D/C:            Co-evaluation              AM-PAC OT "6 Clicks" Daily Activity  Outcome Measure Help from another person eating meals?: None Help from another person taking care of personal grooming?: None Help from another person toileting, which includes using toliet, bedpan, or urinal?: A Little Help from another person bathing (including washing, rinsing, drying)?: A Little Help from another person to put on and taking off regular upper body clothing?: None Help from another person to put on and taking off regular lower body clothing?: A Little 6 Click Score: 21   End of  Session Equipment Utilized During Treatment: Gait belt;Rolling walker  Activity Tolerance: Patient tolerated treatment well Patient left: in chair;with call bell/phone within reach;with chair alarm set;with nursing/sitter in room  OT Visit Diagnosis: Other abnormalities of gait and mobility (R26.89);History of falling (Z91.81)                Time: 2330-0762 OT Time Calculation (min): 34 min Charges:  OT General Charges $OT Visit: 1 Visit OT Evaluation $OT Eval Moderate Complexity: 1 Mod OT Treatments $Self Care/Home Management : 23-37 mins  Jeni Salles, MPH, MS, OTR/L ascom [phone] 02/17/20, 4:13 PM

## 2020-02-17 NOTE — Progress Notes (Signed)
OT Cancellation Note  Patient Details Name: Allison Novak MRN: [id number] DOB: 08/04/1925   Cancelled Treatment:    Reason Eval/Treat Not Completed: Other (comment). Consult received, chart reviewed. Pt eating lunch upon initial attempt. Pt preferred OT come back at later time to give her time to eat. Will re-attempt at later time.   Jeni Salles, MPH, MS, OTR/L ascom [phone] 02/17/20, 1:32 PM

## 2020-02-17 NOTE — Plan of Care (Signed)

## 2020-02-17 NOTE — Progress Notes (Signed)
PROGRESS NOTE    Allison Novak  ONG:[id number] DOB: 04-22-1926 DOA: 02/16/2020 PCP: Adin Hector, MD   Brief Narrative: From prior notes Allison Novak a 86 y.o.femalewith medical history significant forA. fib on Eliquis, HLD, TIA, HTN, carotid artery disease and aortic atherosclerosis, who presented to the emergency room with a several day history of palpitations, now associated with shortness of breath and lightheadedness.   Patient was recently seen by Dr. Ubaldo Glassing on 9/21 and found to have borderline [id number] Holter, after having on a prior visit discontinued digoxin due to elevated levels.   Subjective: Patient denies any chest pain or shortness of breath when seen today.  She appears quite comfortable.  Daughter was in the room.  Patient lives alone but her kids keep an eye on her.  She wants to stay independent.  Assessment & Plan:   Principal Problem:   Acute CHF (congestive heart failure) (HCC) Active Problems:   History of TIA (transient ischemic attack)   Essential hypertension   Hyperlipidemia   Atrial fibrillation with RVR (HCC)   Hypoxia   Chronic anticoagulation   Aortic atherosclerosis (HCC)   Carotid artery disease (HCC)  Acute hypoxic respiratory failure secondary to pulmonary edema. Resolved.  Patient is on room air now.  Acute on chronic combined heart failure.  Echocardiogram with 45 to 50% EF and grade 1 diastolic dysfunction.  UOP of 1L today.  Clinically appears euvolemic.  Cardiology is following. -Continue low-dose Lasix at 20 mg twice daily. -Continue with daily weight and BMP. -Strict intake and output. -PT and home health services ordered.  Atrial fibrillation with RVR (HCC)/Chronic anticoagulation /Acquired thrombophilia.  Initially managed with Cardizem bolus and infusion. Infusion was discontinued.  Heart rate in high 90s to low 100s. Digoxin was discontinued by cardiology and Toprol dose was increased. -Continue with increased  dose of Toprol at 200 mg daily. -Continue with Eliquis. -Continue to hold digoxin per cardiology.  Hypokalemia.  Resolved. -Continue to monitor and replete as needed.  History of TIA (transient ischemic attack) Continue aspirin and atorvastatin  Essential hypertension.  Blood pressure within goal today. --resume home Toprol at increased dose of 200 mg daily, per cards. --continue home Losartan --Keep holding home amlodipine  Hyperlipidemia -Continue atorvastatin  Aortic atherosclerosis (HCC) /Carotid artery disease (HCC) -Continue aspirin and atorvastatin  Gout.  No acute concern --continue home allopurinol  GERD --continue home PPI  Objective: Vitals:   02/16/20 1656 02/17/20 0127 02/17/20 0404 02/17/20 0811  BP: (!) 145/91 126/86  125/81  Pulse: 98 (!) 101  93  Resp: 20 18  18   Temp: 98.4 F (36.9 C) 97.7 F (36.5 C)  97.6 F (36.4 C)  TempSrc:  Oral  Oral  SpO2: 99% 99%  97%  Weight:   65.6 kg   Height:        Intake/Output Summary (Last 24 hours) at 02/17/2020 1232 Last data filed at 02/16/2020 2200 Gross per 24 hour  Intake --  Output 1000 ml  Net -1000 ml   Filed Weights   02/16/20 0123 02/17/20 0404  Weight: 63.5 kg 65.6 kg    Examination:  General exam: Appears calm and comfortable  Respiratory system: Clear to auscultation. Respiratory effort normal. Cardiovascular system: Irregularly irregular Gastrointestinal system: Soft, nontender, nondistended, bowel sounds positive. Central nervous system: Alert and oriented. No focal neurological deficits. Extremities: No edema, no cyanosis, pulses intact and symmetrical. Psychiatry: Judgement and insight appear normal.    DVT prophylaxis: Eliquis Code Status:  Full Family Communication: Daughter was updated at bedside. Disposition Plan:  Status is: Inpatient  Remains inpatient appropriate because:Inpatient level of care appropriate due to severity of illness   Dispo: The patient is  from: Home              Anticipated d/c is to: Home              Anticipated d/c date is: 1 day              Patient currently is not medically stable to d/c.    Consultants:   Cardiology  Procedures:  Antimicrobials:   Data Reviewed: I have personally reviewed following labs and imaging studies  CBC: Recent Labs  Lab 02/16/20 0127 02/17/20 0300  WBC 8.4 7.8  NEUTROABS 6.2  --   HGB 11.7* 11.8*  HCT 35.4* 35.0*  MCV 92.9 91.1  PLT 185 509   Basic Metabolic Panel: Recent Labs  Lab 02/16/20 0127 02/16/20 2227 02/17/20 0300  NA 134* 133* 134*  K 3.1* 4.0 3.7  CL 98 94* 96*  CO2 23 28 28   GLUCOSE 152* 115* 105*  BUN 13 17 18   CREATININE 0.75 1.14* 1.11*  CALCIUM 9.0 9.1 8.9  MG 2.0 2.0 1.9   GFR: Estimated Creatinine Clearance: 29.5 mL/min (A) (by C-G formula based on SCr of 1.11 mg/dL (H)). Liver Function Tests: Recent Labs  Lab 02/16/20 0127  AST 51*  ALT 30  ALKPHOS 111  BILITOT 0.7  PROT 7.2  ALBUMIN 4.1   No results for input(s): LIPASE, AMYLASE in the last 168 hours. No results for input(s): AMMONIA in the last 168 hours. Coagulation Profile: No results for input(s): INR, PROTIME in the last 168 hours. Cardiac Enzymes: No results for input(s): CKTOTAL, CKMB, CKMBINDEX, TROPONINI in the last 168 hours. BNP (last 3 results) No results for input(s): PROBNP in the last 8760 hours. HbA1C: No results for input(s): HGBA1C in the last 72 hours. CBG: No results for input(s): GLUCAP in the last 168 hours. Lipid Profile: No results for input(s): CHOL, HDL, LDLCALC, TRIG, CHOLHDL, LDLDIRECT in the last 72 hours. Thyroid Function Tests: No results for input(s): TSH, T4TOTAL, FREET4, T3FREE, THYROIDAB in the last 72 hours. Anemia Panel: No results for input(s): VITAMINB12, FOLATE, FERRITIN, TIBC, IRON, RETICCTPCT in the last 72 hours. Sepsis Labs: No results for input(s): PROCALCITON, LATICACIDVEN in the last 168 hours.  Recent Results (from the past  240 hour(s))  Respiratory Panel by RT PCR (Flu A&B, Covid) - Nasopharyngeal Swab     Status: None   Collection Time: 02/16/20  2:05 AM   Specimen: Nasopharyngeal Swab  Result Value Ref Range Status   SARS Coronavirus 2 by RT PCR NEGATIVE NEGATIVE Final    Comment: (NOTE) SARS-CoV-2 target nucleic acids are NOT DETECTED.  The SARS-CoV-2 RNA is generally detectable in upper respiratoy specimens during the acute phase of infection. The lowest concentration of SARS-CoV-2 viral copies this assay can detect is 131 copies/mL. A negative result does not preclude SARS-Cov-2 infection and should not be used as the sole basis for treatment or other patient management decisions. A negative result may occur with  improper specimen collection/handling, submission of specimen other than nasopharyngeal swab, presence of viral mutation(s) within the areas targeted by this assay, and inadequate number of viral copies (<131 copies/mL). A negative result must be combined with clinical observations, patient history, and epidemiological information. The expected result is Negative.  Fact Sheet for Patients:  PinkCheek.be  Fact  Sheet for Healthcare Providers:  GravelBags.it  This test is no t yet approved or cleared by the [place] FDA and  has been authorized for detection and/or diagnosis of SARS-CoV-2 by FDA under an Emergency Use Authorization (EUA). This EUA will remain  in effect (meaning this test can be used) for the duration of the COVID-19 declaration under Section 564(b)(1) of the Act, 21 U.S.C. section 360bbb-3(b)(1), unless the authorization is terminated or revoked sooner.     Influenza A by PCR NEGATIVE NEGATIVE Final   Influenza B by PCR NEGATIVE NEGATIVE Final    Comment: (NOTE) The Xpert Xpress SARS-CoV-2/FLU/RSV assay is intended as an aid in  the diagnosis of influenza from Nasopharyngeal swab specimens and  should  not be used as a sole basis for treatment. Nasal washings and  aspirates are unacceptable for Xpert Xpress SARS-CoV-2/FLU/RSV  testing.  Fact Sheet for Patients: PinkCheek.be  Fact Sheet for Healthcare Providers: GravelBags.it  This test is not yet approved or cleared by the [place] FDA and  has been authorized for detection and/or diagnosis of SARS-CoV-2 by  FDA under an Emergency Use Authorization (EUA). This EUA will remain  in effect (meaning this test can be used) for the duration of the  Covid-19 declaration under Section 564(b)(1) of the Act, 21  U.S.C. section 360bbb-3(b)(1), unless the authorization is  terminated or revoked. Performed at [facility], [address] [place], [place] [address]      Radiology Studies: DG Chest 2 View  Result Date: 02/16/2020 CLINICAL DATA:  AFib with RVR. EXAM: CHEST - 2 VIEW COMPARISON:  April 08, 2010 FINDINGS: There is cardiomegaly with Kerley B lines and prominent interstitial lung markings. There are small bilateral pleural effusions, right greater than left. There is no focal infiltrate. Aortic calcifications are noted. The pulmonary arteries appear dilated. IMPRESSION: 1. Cardiomegaly with pulmonary edema and small bilateral pleural effusions. 2. Dilated pulmonary arteries which can be seen in patients with elevated pulmonary artery pressures. Electronically Signed   By: Constance Holster M.D.   On: 02/16/2020 03:07   ECHOCARDIOGRAM COMPLETE  Result Date: 02/16/2020    ECHOCARDIOGRAM REPORT   Patient Name:   Allison Novak Date of Exam: 02/16/2020 Medical Rec #:  [id number]       Height:       63.0 in Accession #:    [id number]      Weight:       140.0 lb Date of Birth:  [birth year]       BSA:          1.662 m Patient Age:    66 years        BP:           159/94 mmHg Patient Gender: F               HR:           99 bpm. Exam Location:  [facility] Procedure: 2D Echo,  Color Doppler and Cardiac Doppler Indications:     I50.31 CHF-Acute Diastolic  History:         Patient has no prior history of Echocardiogram examinations.                  CAD; Risk Factors:Hypertension.  Sonographer:     Charmayne Sheer RDCS (AE) Referring Phys:  [id number] Athena Masse Diagnosing Phys: Isaias Cowman MD  Sonographer Comments: Suboptimal subcostal window. IMPRESSIONS  1. Left ventricular ejection fraction, by estimation, is 45  to 50%. The left ventricle has mildly decreased function. The left ventricle has no regional wall motion abnormalities. Left ventricular diastolic parameters are consistent with Grade I diastolic dysfunction (impaired relaxation).  2. Right ventricular systolic function is normal. The right ventricular size is normal.  3. Left atrial size was mildly dilated.  4. Right atrial size was moderately dilated.  5. The mitral valve is normal in structure. Mild to moderate mitral valve regurgitation. No evidence of mitral stenosis.  6. Tricuspid valve regurgitation is mild to moderate.  7. The aortic valve is normal in structure. Aortic valve regurgitation is mild. No aortic stenosis is present.  8. The inferior vena cava is normal in size with greater than 50% respiratory variability, suggesting right atrial pressure of 3 mmHg. FINDINGS  Left Ventricle: Left ventricular ejection fraction, by estimation, is 45 to 50%. The left ventricle has mildly decreased function. The left ventricle has no regional wall motion abnormalities. The left ventricular internal cavity size was normal in size. There is no left ventricular hypertrophy. Left ventricular diastolic parameters are consistent with Grade I diastolic dysfunction (impaired relaxation). Right Ventricle: The right ventricular size is normal. No increase in right ventricular wall thickness. Right ventricular systolic function is normal. Left Atrium: Left atrial size was mildly dilated. Right Atrium: Right atrial size was moderately  dilated. Pericardium: There is no evidence of pericardial effusion. Mitral Valve: The mitral valve is normal in structure. Mild to moderate mitral valve regurgitation. No evidence of mitral valve stenosis. MV peak gradient, 3.3 mmHg. The mean mitral valve gradient is 1.0 mmHg. Tricuspid Valve: The tricuspid valve is normal in structure. Tricuspid valve regurgitation is mild to moderate. No evidence of tricuspid stenosis. Aortic Valve: The aortic valve is normal in structure. Aortic valve regurgitation is mild. No aortic stenosis is present. Aortic valve mean gradient measures 2.0 mmHg. Aortic valve peak gradient measures 4.3 mmHg. Aortic valve area, by VTI measures 1.45 cm. Pulmonic Valve: The pulmonic valve was normal in structure. Pulmonic valve regurgitation is not visualized. No evidence of pulmonic stenosis. Aorta: The aortic root is normal in size and structure. Venous: The inferior vena cava is normal in size with greater than 50% respiratory variability, suggesting right atrial pressure of 3 mmHg. IAS/Shunts: No atrial level shunt detected by color flow Doppler.  LEFT VENTRICLE PLAX 2D LVIDd:         4.65 cm  Diastology LVIDs:         3.04 cm  LV e' medial:    8.16 cm/s LV PW:         0.94 cm  LV E/e' medial:  10.8 LV IVS:        0.77 cm  LV e' lateral:   10.90 cm/s LVOT diam:     2.00 cm  LV E/e' lateral: 8.1 LV SV:         28 LV SV Index:   17 LVOT Area:     3.14 cm  RIGHT VENTRICLE RV Basal diam:  4.00 cm LEFT ATRIUM             Index       RIGHT ATRIUM           Index LA diam:        3.30 cm 1.99 cm/m  RA Area:     24.70 cm LA Vol (A2C):   35.5 ml 21.36 ml/m RA Volume:   85.40 ml  51.39 ml/m LA Vol (A4C):   47.7 ml 28.71 ml/m  LA Biplane Vol: 44.4 ml 26.72 ml/m  AORTIC VALVE                   PULMONIC VALVE AV Area (Vmax):    1.87 cm    PV Vmax:       0.85 m/s AV Area (Vmean):   1.71 cm    PV Vmean:      58.300 cm/s AV Area (VTI):     1.45 cm    PV VTI:        0.132 m AV Vmax:           104.00  cm/s PV Peak grad:  2.9 mmHg AV Vmean:          71.700 cm/s PV Mean grad:  1.0 mmHg AV VTI:            0.194 m AV Peak Grad:      4.3 mmHg AV Mean Grad:      2.0 mmHg LVOT Vmax:         62.00 cm/s LVOT Vmean:        39.100 cm/s LVOT VTI:          0.090 m LVOT/AV VTI ratio: 0.46  AORTA Ao Root diam: 3.00 cm MITRAL VALVE               TRICUSPID VALVE MV Area (PHT): 4.24 cm    TR Peak grad:   22.1 mmHg MV Peak grad:  3.3 mmHg    TR Vmax:        235.00 cm/s MV Mean grad:  1.0 mmHg MV Vmax:       0.90 m/s    SHUNTS MV Vmean:      51.6 cm/s   Systemic VTI:  0.09 m MV Decel Time: 179 msec    Systemic Diam: 2.00 cm MV E velocity: 88.13 cm/s MV A velocity: 89.10 cm/s MV E/A ratio:  0.99 Isaias Cowman MD Electronically signed by Isaias Cowman MD Signature Date/Time: 02/16/2020/2:55:12 PM    Final     Scheduled Meds: . allopurinol  100 mg Oral Daily  . apixaban  5 mg Oral BID  . aspirin EC  81 mg Oral Daily  . atorvastatin  40 mg Oral Daily  . furosemide  20 mg Intravenous BID  . losartan  100 mg Oral Daily  . metoprolol succinate  200 mg Oral Daily  . oxybutynin  5 mg Oral QHS  . pantoprazole  40 mg Oral Daily   Continuous Infusions: . diltiazem (CARDIZEM) infusion Stopped (02/16/20 0413)  . diltiazem (CARDIZEM) infusion Stopped (02/16/20 1056)     LOS: 1 day   Time spent: 30 minutes.  Lorella Nimrod, MD [facility]  If 7PM-7AM, please contact night-coverage [email or web address]  02/17/2020, 12:32 PM   This record has been created using Systems analyst. Errors have been sought and corrected,but may not always be located. Such creation errors do not reflect on the standard of care.

## 2020-02-18 DIAGNOSIS — E876 Hypokalemia: Secondary | ICD-10-CM

## 2020-02-18 DIAGNOSIS — R002 Palpitations: Principal | ICD-10-CM

## 2020-02-18 DIAGNOSIS — R0902 Hypoxemia: Secondary | ICD-10-CM

## 2020-02-18 LAB — CBC
HCT: 35.4 % — ABNORMAL LOW (ref 36.0–46.0)
Hemoglobin: 11.6 g/dL — ABNORMAL LOW (ref 12.0–15.0)
MCH: 30.6 pg (ref 26.0–34.0)
MCHC: 32.8 g/dL (ref 30.0–36.0)
MCV: 93.4 fL (ref 80.0–100.0)
Platelets: 190 10*3/uL (ref 150–400)
RBC: 3.79 MIL/uL — ABNORMAL LOW (ref 3.87–5.11)
RDW: 14.1 % (ref 11.5–15.5)
WBC: 8.2 10*3/uL (ref 4.0–10.5)
nRBC: 0 % (ref 0.0–0.2)

## 2020-02-18 LAB — BASIC METABOLIC PANEL
Anion gap: 11 (ref 5–15)
BUN: 28 mg/dL — ABNORMAL HIGH (ref 8–23)
CO2: 29 mmol/L (ref 22–32)
Calcium: 8.7 mg/dL — ABNORMAL LOW (ref 8.9–10.3)
Chloride: 97 mmol/L — ABNORMAL LOW (ref 98–111)
Creatinine, Ser: 1.45 mg/dL — ABNORMAL HIGH (ref 0.44–1.00)
GFR, Estimated: 31 mL/min — ABNORMAL LOW (ref >60)
Glucose, Bld: 96 mg/dL (ref 70–99)
Potassium: 4 mmol/L (ref 3.5–5.1)
Sodium: 137 mmol/L (ref 135–145)

## 2020-02-18 LAB — MAGNESIUM: Magnesium: 2 mg/dL (ref 1.7–2.4)

## 2020-02-18 MED ORDER — METOPROLOL SUCCINATE ER 200 MG PO TB24
200.0000 mg | ORAL_TABLET | Freq: Every day | ORAL | 0 refills | Status: DC
Start: 2020-02-18 — End: 2021-06-10

## 2020-02-18 MED ORDER — FUROSEMIDE 20 MG PO TABS: 20.0000 mg | ORAL_TABLET | Freq: Every day | ORAL | 11 refills | Status: DC

## 2020-02-18 MED ORDER — POTASSIUM CHLORIDE ER 10 MEQ PO TBCR: 10.0000 meq | EXTENDED_RELEASE_TABLET | Freq: Every day | ORAL | 0 refills | Status: DC

## 2020-02-18 NOTE — Discharge Summary (Signed)
Physician Discharge Summary  Allison Novak UDJ:[id number] DOB: 14-May-1925 DOA: 02/16/2020  PCP: Adin Hector, MD  Admit date: 02/16/2020 Discharge date: 02/18/2020  Admitted From: Home Disposition: Home  Recommendations for Outpatient Follow-up:  1. Follow up with PCP in 1-2 weeks 2. Please obtain BMP/CBC in one week 3. Please follow up on the following pending results: None  Home Health: Yes Equipment/Devices: Rolling walker Discharge Condition: Stable CODE STATUS: Full Diet recommendation: Heart Healthy / Carb Modified   Brief/Interim Summary: Allison T Cobbis a 86 y.o.femalewith medical history significant forA. fib on Eliquis, HLD, TIA, HTN, carotid artery disease and aortic atherosclerosis, who presentedto the emergency room with a several day history of palpitations, now associated with shortness of breath and lightheadedness.   Patient was recently seen by Dr. Ubaldo Glassing on 9/21 and found to have borderline [id number] Holter, after having on a prior visit discontinued digoxin due to elevated levels.  Patient did had A. fib with RVR on presentation.  Initially managed with Cardizem bolus and infusion.  Heart rate remained stable after increasing the dose of Toprol 200 mg daily.  She will continue with Eliquis and keep holding digoxin per cardiology and will follow up with them as an outpatient.  Patient will continue with rest of her home meds and follow-up with her providers.  Discharge Diagnoses:  Principal Problem:   Acute CHF (congestive heart failure) (HCC) Active Problems:   History of TIA (transient ischemic attack)   Essential hypertension   Hyperlipidemia   Atrial fibrillation with RVR (HCC)   Hypoxia   Chronic anticoagulation   Aortic atherosclerosis (HCC)   Carotid artery disease (HCC)   Hypokalemia   Palpitations   Discharge Instructions  Discharge Instructions    (HEART FAILURE PATIENTS) Call MD:  Anytime you have any of the following  symptoms: 1) 3 pound weight gain in 24 hours or 5 pounds in 1 week 2) shortness of breath, with or without a dry hacking cough 3) swelling in the hands, feet or stomach 4) if you have to sleep on extra pillows at night in order to breathe.   Complete by: As directed    Amb referral to AFIB Clinic   Complete by: As directed    Diet - low sodium heart healthy   Complete by: As directed    Discharge instructions   Complete by: As directed    It was pleasure taking care of you. Your cardiologist increase the dose of metoprolol and discontinue amlodipine. Please take your medications as directed, watch salt content of your diet and follow-up in heart failure clinic.   Increase activity slowly   Complete by: As directed      Allergies as of 02/18/2020      Reactions   Keflex [cephalexin] Other (See Comments)   Pt cant remember exact reaction   Macrodantin [nitrofurantoin Macrocrystal] Other (See Comments)      Medication List    STOP taking these medications   amLODipine 2.5 MG tablet Commonly known as: NORVASC     TAKE these medications   allopurinol 100 MG tablet Commonly known as: ZYLOPRIM Take 100 mg by mouth daily.   aspirin 81 MG EC tablet Take 1 tablet (81 mg total) by mouth daily.   atorvastatin 40 MG tablet Commonly known as: LIPITOR Take 40 mg by mouth daily.   calcium carbonate 1500 (600 Ca) MG Tabs tablet Commonly known as: OSCAL Take 1 tablet by mouth in the morning and at bedtime.  Cholecalciferol 50 MCG (2000 UT) Caps Take 1 capsule by mouth daily.   Eliquis 2.5 MG Tabs tablet Generic drug: apixaban Take 2.5 mg by mouth 2 (two) times daily.   losartan 100 MG tablet Commonly known as: COZAAR Take 1 tablet (100 mg total) by mouth daily.   metoprolol 200 MG 24 hr tablet Commonly known as: TOPROL-XL Take 1 tablet (200 mg total) by mouth daily. Take with or immediately following a meal. What changed:   medication strength  how much to  take  additional instructions  Another medication with the same name was removed. Continue taking this medication, and follow the directions you see here.   omeprazole 40 MG capsule Commonly known as: PRILOSEC Take 40 mg by mouth daily.   oxybutynin 5 MG tablet Commonly known as: DITROPAN Take 5 mg by mouth at bedtime.       Follow-up Information    [facility] Follow up on 02/26/2020.   Specialty: Cardiology Why: ar 11:00am. Enter through the [facility] entrance Contact information: [place] [phone]       Adin Hector, MD. Schedule an appointment as soon as possible for a visit.   Specialty: Internal Medicine Contact information: [place] [address] [phone]        Teodoro Spray, MD. Go in 1 week(s).   Specialty: Cardiology Contact information: [address] [place] [address] [phone]              Allergies  Allergen Reactions  . Keflex [Cephalexin] Other (See Comments)    Pt cant remember exact reaction  . Macrodantin [Nitrofurantoin Macrocrystal] Other (See Comments)    Consultations:  Cardiology  Procedures/Studies: DG Chest 2 View  Result Date: 02/16/2020 CLINICAL DATA:  AFib with RVR. EXAM: CHEST - 2 VIEW COMPARISON:  April 08, 2010 FINDINGS: There is cardiomegaly with Kerley B lines and prominent interstitial lung markings. There are small bilateral pleural effusions, right greater than left. There is no focal infiltrate. Aortic calcifications are noted. The pulmonary arteries appear dilated. IMPRESSION: 1. Cardiomegaly with pulmonary edema and small bilateral pleural effusions. 2. Dilated pulmonary arteries which can be seen in patients with elevated pulmonary artery pressures. Electronically Signed   By: Allison Novak M.D.   On: 02/16/2020 03:07   ECHOCARDIOGRAM  COMPLETE  Result Date: 02/16/2020    ECHOCARDIOGRAM REPORT   Patient Name:   Allison Novak Date of Exam: 02/16/2020 Medical Rec #:  [id number]       Height:       63.0 in Accession #:    [id number]      Weight:       140.0 lb Date of Birth:  09-Nov-1925       BSA:          1.662 m Patient Age:    86 years        BP:           159/94 mmHg Patient Gender: F               HR:           99 bpm. Exam Location:  [facility] Procedure: 2D Echo, Color Doppler and Cardiac Doppler Indications:     I50.31 CHF-Acute Diastolic  History:         Patient has no prior history of Echocardiogram examinations.  CAD; Risk Factors:Hypertension.  Sonographer:     Charmayne Sheer RDCS (AE) Referring Phys:  [id number] Athena Masse Diagnosing Phys: Isaias Cowman MD  Sonographer Comments: Suboptimal subcostal window. IMPRESSIONS  1. Left ventricular ejection fraction, by estimation, is 45 to 50%. The left ventricle has mildly decreased function. The left ventricle has no regional wall motion abnormalities. Left ventricular diastolic parameters are consistent with Grade I diastolic dysfunction (impaired relaxation).  2. Right ventricular systolic function is normal. The right ventricular size is normal.  3. Left atrial size was mildly dilated.  4. Right atrial size was moderately dilated.  5. The mitral valve is normal in structure. Mild to moderate mitral valve regurgitation. No evidence of mitral stenosis.  6. Tricuspid valve regurgitation is mild to moderate.  7. The aortic valve is normal in structure. Aortic valve regurgitation is mild. No aortic stenosis is present.  8. The inferior vena cava is normal in size with greater than 50% respiratory variability, suggesting right atrial pressure of 3 mmHg. FINDINGS  Left Ventricle: Left ventricular ejection fraction, by estimation, is 45 to 50%. The left ventricle has mildly decreased function. The left ventricle has no regional wall motion abnormalities. The left ventricular  internal cavity size was normal in size. There is no left ventricular hypertrophy. Left ventricular diastolic parameters are consistent with Grade I diastolic dysfunction (impaired relaxation). Right Ventricle: The right ventricular size is normal. No increase in right ventricular wall thickness. Right ventricular systolic function is normal. Left Atrium: Left atrial size was mildly dilated. Right Atrium: Right atrial size was moderately dilated. Pericardium: There is no evidence of pericardial effusion. Mitral Valve: The mitral valve is normal in structure. Mild to moderate mitral valve regurgitation. No evidence of mitral valve stenosis. MV peak gradient, 3.3 mmHg. The mean mitral valve gradient is 1.0 mmHg. Tricuspid Valve: The tricuspid valve is normal in structure. Tricuspid valve regurgitation is mild to moderate. No evidence of tricuspid stenosis. Aortic Valve: The aortic valve is normal in structure. Aortic valve regurgitation is mild. No aortic stenosis is present. Aortic valve mean gradient measures 2.0 mmHg. Aortic valve peak gradient measures 4.3 mmHg. Aortic valve area, by VTI measures 1.45 cm. Pulmonic Valve: The pulmonic valve was normal in structure. Pulmonic valve regurgitation is not visualized. No evidence of pulmonic stenosis. Aorta: The aortic root is normal in size and structure. Venous: The inferior vena cava is normal in size with greater than 50% respiratory variability, suggesting right atrial pressure of 3 mmHg. IAS/Shunts: No atrial level shunt detected by color flow Doppler.  LEFT VENTRICLE PLAX 2D LVIDd:         4.65 cm  Diastology LVIDs:         3.04 cm  LV e' medial:    8.16 cm/s LV PW:         0.94 cm  LV E/e' medial:  10.8 LV IVS:        0.77 cm  LV e' lateral:   10.90 cm/s LVOT diam:     2.00 cm  LV E/e' lateral: 8.1 LV SV:         28 LV SV Index:   17 LVOT Area:     3.14 cm  RIGHT VENTRICLE RV Basal diam:  4.00 cm LEFT ATRIUM             Index       RIGHT ATRIUM           Index  LA diam:  3.30 cm 1.99 cm/m  RA Area:     24.70 cm LA Vol (A2C):   35.5 ml 21.36 ml/m RA Volume:   85.40 ml  51.39 ml/m LA Vol (A4C):   47.7 ml 28.71 ml/m LA Biplane Vol: 44.4 ml 26.72 ml/m  AORTIC VALVE                   PULMONIC VALVE AV Area (Vmax):    1.87 cm    PV Vmax:       0.85 m/s AV Area (Vmean):   1.71 cm    PV Vmean:      58.300 cm/s AV Area (VTI):     1.45 cm    PV VTI:        0.132 m AV Vmax:           104.00 cm/s PV Peak grad:  2.9 mmHg AV Vmean:          71.700 cm/s PV Mean grad:  1.0 mmHg AV VTI:            0.194 m AV Peak Grad:      4.3 mmHg AV Mean Grad:      2.0 mmHg LVOT Vmax:         62.00 cm/s LVOT Vmean:        39.100 cm/s LVOT VTI:          0.090 m LVOT/AV VTI ratio: 0.46  AORTA Ao Root diam: 3.00 cm MITRAL VALVE               TRICUSPID VALVE MV Area (PHT): 4.24 cm    TR Peak grad:   22.1 mmHg MV Peak grad:  3.3 mmHg    TR Vmax:        235.00 cm/s MV Mean grad:  1.0 mmHg MV Vmax:       0.90 m/s    SHUNTS MV Vmean:      51.6 cm/s   Systemic VTI:  0.09 m MV Decel Time: 179 msec    Systemic Diam: 2.00 cm MV E velocity: 88.13 cm/s MV A velocity: 89.10 cm/s MV E/A ratio:  0.99 Isaias Cowman MD Electronically signed by Isaias Cowman MD Signature Date/Time: 02/16/2020/2:55:12 PM    Final      Subjective: Patient has no new complaints when seen today.  She was walking around with the help of PT.  Daughter was at bedside.  Eager to go home.  Discharge Exam: Vitals:   02/18/20 0001 02/18/20 0808  BP: 115/70 124/66  Pulse: 93 83  Resp:  16  Temp: (!) 97.5 F (36.4 C) 98.2 F (36.8 C)  SpO2: 97% 95%   Vitals:   02/17/20 1540 02/18/20 0001 02/18/20 0500 02/18/20 0808  BP: 107/68 115/70  124/66  Pulse: 90 93  83  Resp: 16   16  Temp: 98.7 F (37.1 C) (!) 97.5 F (36.4 C)  98.2 F (36.8 C)  TempSrc: Oral Oral  Oral  SpO2: 96% 97%  95%  Weight:   64.4 kg   Height:        General: Pt is alert, awake, not in acute distress Cardiovascular: RRR,  S1/S2 +, no rubs, no gallops Respiratory: CTA bilaterally, no wheezing, no rhonchi Abdominal: Soft, NT, ND, bowel sounds + Extremities: no edema, no cyanosis   The results of significant diagnostics from this hospitalization (including imaging, microbiology, ancillary and laboratory) are listed below for reference.    Microbiology: Recent Results (from the past  240 hour(s))  Respiratory Panel by RT PCR (Flu A&B, Covid) - Nasopharyngeal Swab     Status: None   Collection Time: 02/16/20  2:05 AM   Specimen: Nasopharyngeal Swab  Result Value Ref Range Status   SARS Coronavirus 2 by RT PCR NEGATIVE NEGATIVE Final    Comment: (NOTE) SARS-CoV-2 target nucleic acids are NOT DETECTED.  The SARS-CoV-2 RNA is generally detectable in upper respiratoy specimens during the acute phase of infection. The lowest concentration of SARS-CoV-2 viral copies this assay can detect is 131 copies/mL. A negative result does not preclude SARS-Cov-2 infection and should not be used as the sole basis for treatment or other patient management decisions. A negative result may occur with  improper specimen collection/handling, submission of specimen other than nasopharyngeal swab, presence of viral mutation(s) within the areas targeted by this assay, and inadequate number of viral copies (<131 copies/mL). A negative result must be combined with clinical observations, patient history, and epidemiological information. The expected result is Negative.  Fact Sheet for Patients:  PinkCheek.be  Fact Sheet for Healthcare Providers:  GravelBags.it  This test is no t yet approved or cleared by the [place] FDA and  has been authorized for detection and/or diagnosis of SARS-CoV-2 by FDA under an Emergency Use Authorization (EUA). This EUA will remain  in effect (meaning this test can be used) for the duration of the COVID-19 declaration under Section  564(b)(1) of the Act, 21 U.S.C. section 360bbb-3(b)(1), unless the authorization is terminated or revoked sooner.     Influenza A by PCR NEGATIVE NEGATIVE Final   Influenza B by PCR NEGATIVE NEGATIVE Final    Comment: (NOTE) The Xpert Xpress SARS-CoV-2/FLU/RSV assay is intended as an aid in  the diagnosis of influenza from Nasopharyngeal swab specimens and  should not be used as a sole basis for treatment. Nasal washings and  aspirates are unacceptable for Xpert Xpress SARS-CoV-2/FLU/RSV  testing.  Fact Sheet for Patients: PinkCheek.be  Fact Sheet for Healthcare Providers: GravelBags.it  This test is not yet approved or cleared by the [place] FDA and  has been authorized for detection and/or diagnosis of SARS-CoV-2 by  FDA under an Emergency Use Authorization (EUA). This EUA will remain  in effect (meaning this test can be used) for the duration of the  Covid-19 declaration under Section 564(b)(1) of the Act, 21  U.S.C. section 360bbb-3(b)(1), unless the authorization is  terminated or revoked. Performed at [facility], [address] [place], [place] [address]      Labs: BNP (last 3 results) Recent Labs    02/16/20 0419  BNP 008.6*   Basic Metabolic Panel: Recent Labs  Lab 02/16/20 0127 02/16/20 2227 02/17/20 0300 02/18/20 0335  NA 134* 133* 134* 137  K 3.1* 4.0 3.7 4.0  CL 98 94* 96* 97*  CO2 23 28 28 29   GLUCOSE 152* 115* 105* 96  BUN 13 17 18  28*  CREATININE 0.75 1.14* 1.11* 1.45*  CALCIUM 9.0 9.1 8.9 8.7*  MG 2.0 2.0 1.9 2.0   Liver Function Tests: Recent Labs  Lab 02/16/20 0127  AST 51*  ALT 30  ALKPHOS 111  BILITOT 0.7  PROT 7.2  ALBUMIN 4.1   No results for input(s): LIPASE, AMYLASE in the last 168 hours. No results for input(s): AMMONIA in the last 168 hours. CBC: Recent Labs  Lab 02/16/20 0127 02/17/20 0300 02/18/20 0335  WBC 8.4 7.8 8.2  NEUTROABS 6.2  --    --   HGB  11.7* 11.8* 11.6*  HCT 35.4* 35.0* 35.4*  MCV 92.9 91.1 93.4  PLT 185 183 190   Cardiac Enzymes: No results for input(s): CKTOTAL, CKMB, CKMBINDEX, TROPONINI in the last 168 hours. BNP: Invalid input(s): POCBNP CBG: No results for input(s): GLUCAP in the last 168 hours. D-Dimer No results for input(s): DDIMER in the last 72 hours. Hgb A1c No results for input(s): HGBA1C in the last 72 hours. Lipid Profile No results for input(s): CHOL, HDL, LDLCALC, TRIG, CHOLHDL, LDLDIRECT in the last 72 hours. Thyroid function studies No results for input(s): TSH, T4TOTAL, T3FREE, THYROIDAB in the last 72 hours.  Invalid input(s): FREET3 Anemia work up No results for input(s): VITAMINB12, FOLATE, FERRITIN, TIBC, IRON, RETICCTPCT in the last 72 hours. Urinalysis    Component Value Date/Time   COLORURINE COLORLESS (A) 02/18/2017 1318   APPEARANCEUR CLEAR (A) 02/18/2017 1318   LABSPEC 1.002 (L) 02/18/2017 1318   PHURINE 7.0 02/18/2017 1318   GLUCOSEU NEGATIVE 02/18/2017 1318   HGBUR NEGATIVE 02/18/2017 1318   BILIRUBINUR NEGATIVE 02/18/2017 1318   KETONESUR NEGATIVE 02/18/2017 1318   PROTEINUR NEGATIVE 02/18/2017 1318   NITRITE NEGATIVE 02/18/2017 1318   LEUKOCYTESUR NEGATIVE 02/18/2017 1318   Sepsis Labs Invalid input(s): PROCALCITONIN,  WBC,  LACTICIDVEN Microbiology Recent Results (from the past 240 hour(s))  Respiratory Panel by RT PCR (Flu A&B, Covid) - Nasopharyngeal Swab     Status: None   Collection Time: 02/16/20  2:05 AM   Specimen: Nasopharyngeal Swab  Result Value Ref Range Status   SARS Coronavirus 2 by RT PCR NEGATIVE NEGATIVE Final    Comment: (NOTE) SARS-CoV-2 target nucleic acids are NOT DETECTED.  The SARS-CoV-2 RNA is generally detectable in upper respiratoy specimens during the acute phase of infection. The lowest concentration of SARS-CoV-2 viral copies this assay can detect is 131 copies/mL. A negative result does not preclude SARS-Cov-2 infection  and should not be used as the sole basis for treatment or other patient management decisions. A negative result may occur with  improper specimen collection/handling, submission of specimen other than nasopharyngeal swab, presence of viral mutation(s) within the areas targeted by this assay, and inadequate number of viral copies (<131 copies/mL). A negative result must be combined with clinical observations, patient history, and epidemiological information. The expected result is Negative.  Fact Sheet for Patients:  PinkCheek.be  Fact Sheet for Healthcare Providers:  GravelBags.it  This test is no t yet approved or cleared by the [place] FDA and  has been authorized for detection and/or diagnosis of SARS-CoV-2 by FDA under an Emergency Use Authorization (EUA). This EUA will remain  in effect (meaning this test can be used) for the duration of the COVID-19 declaration under Section 564(b)(1) of the Act, 21 U.S.C. section 360bbb-3(b)(1), unless the authorization is terminated or revoked sooner.     Influenza A by PCR NEGATIVE NEGATIVE Final   Influenza B by PCR NEGATIVE NEGATIVE Final    Comment: (NOTE) The Xpert Xpress SARS-CoV-2/FLU/RSV assay is intended as an aid in  the diagnosis of influenza from Nasopharyngeal swab specimens and  should not be used as a sole basis for treatment. Nasal washings and  aspirates are unacceptable for Xpert Xpress SARS-CoV-2/FLU/RSV  testing.  Fact Sheet for Patients: PinkCheek.be  Fact Sheet for Healthcare Providers: GravelBags.it  This test is not yet approved or cleared by the [place] FDA and  has been authorized for detection and/or diagnosis of SARS-CoV-2 by  FDA under an Emergency Use Authorization (EUA). This EUA  will remain  in effect (meaning this test can be used) for the duration of the  Covid-19 declaration  under Section 564(b)(1) of the Act, 21  U.S.C. section 360bbb-3(b)(1), unless the authorization is  terminated or revoked. Performed at [facility], [address] [place], [place] [address]     Time coordinating discharge: Over 30 minutes  SIGNED:  Lorella Nimrod, MD  [facility] 02/18/2020, 9:57 AM  If 7PM-7AM, please contact night-coverage [email or web address]  This record has been created using Systems analyst. Errors have been sought and corrected,but may not always be located. Such creation errors do not reflect on the standard of care.

## 2020-02-18 NOTE — TOC Initial Note (Signed)
Transition of Care [facility]) - Initial/Assessment Note    Patient Details  Name: Allison Novak MRN: [id number] Date of Birth: [birth year]  Transition of Care [facility]) CM/SW Contact:    Shelbie Ammons, RN Phone Number: 02/18/2020, 1:40 PM  Clinical Narrative:    RNCM met with patient in room, patient sitting up in wheelchair and reports to feeling well today and feels like she is ready to go home. Discussed with patient recommendation for home health services, patient reports at this time she doesn't feel like she needs any help at home. She understands that if she changes her mind after she goes home she can call her PCP. RNCM will sign off at this time.                      Patient Goals and CMS Choice        Expected Discharge Plan and Services           Expected Discharge Date: 02/18/20                                    Prior Living Arrangements/Services                       Activities of Daily Living Home Assistive Devices/Equipment: Shower chair without back, Environmental consultant (specify type), Wheelchair ADL Screening (condition at time of admission) Patient's cognitive ability adequate to safely complete daily activities?: Yes Is the patient deaf or have difficulty hearing?: No Does the patient have difficulty seeing, even when wearing glasses/contacts?: No Does the patient have difficulty concentrating, remembering, or making decisions?: No Patient able to express need for assistance with ADLs?: Yes Does the patient have difficulty dressing or bathing?: No Independently performs ADLs?: Yes (appropriate for developmental age) Does the patient have difficulty walking or climbing stairs?: No Weakness of Legs: None Weakness of Arms/Hands: Left  Permission Sought/Granted                  Emotional Assessment              Admission diagnosis:  Palpitations [R00.2] Hypokalemia [E87.6] Hypoxia [R09.02] Acute CHF (congestive heart failure) ([place])  [I50.9] Atrial fibrillation with RVR ([place]) [I48.91] Acute congestive heart failure, unspecified heart failure type ([place]) [I50.9] Patient Active Problem List   Diagnosis Date Noted  . Hypokalemia   . Palpitations   . Hyperlipidemia 02/16/2020  . Atrial fibrillation with RVR ([place]) 02/16/2020  . Hypoxia 02/16/2020  . Acute CHF (congestive heart failure) ([place]) 02/16/2020  . Chronic anticoagulation 02/16/2020  . History of TIA (transient ischemic attack) 02/23/2017  . Carotid artery disease ([place]) 02/23/2017  . TIA (transient ischemic attack) 02/18/2017  . GERD (gastroesophageal reflux disease) 02/18/2017  . Chest pain 06/23/2015  . HTN (hypertension), malignant 06/23/2015  . Essential hypertension 06/16/2015  . Aortic atherosclerosis ([place]) 06/16/2015   PCP:  Adin Hector, MD Pharmacy:   [facility] [place], [place] - [address] [place] [address] Phone: [phone] Fax: [id number] [place], [place] - [address] [place] [address] Phone: [phone] Fax: [phone]     Social Determinants of Health (SDOH) Interventions    Readmission Risk Interventions No flowsheet data found.

## 2020-02-18 NOTE — Progress Notes (Signed)
Pt discharging home today. Refused home health. Discharge instructions provided to pt. Verbalized understanding. IV removed. Son picking her up. Wheeled to car by staff.  Ronnette Hila, RN

## 2020-02-18 NOTE — Evaluation (Signed)
Physical Therapy Evaluation Patient Details Name: Allison Novak MRN: [id number] DOB: 06/11/1925 Today's Date: 02/18/2020   History of Present Illness  86 y.o. female with medical history significant for A. fib on Eliquis, HLD, TIA, HTN, carotid artery disease and aortic atherosclerosis, who presented to the emergency room with a several day history of palpitations, now associated with shortness of breath and lightheadedness.    Clinical Impression  Pt alert, in chair with family at bedside, oriented x4. The patient reported that she is independent at home for mobility, uses a cane at night for bathroom trips, no recent falls. Her daughter assists with IADLs as needed.  The patient demonstrated sit <> stand transfers from recliner and standard commode during session. UE support needed and CGA provided but not necessarily required. Supervision for toileting activities and hand washing at sink. She ambulated ~174ft with CGA, trialed RW and SPC. Pt did exhibit increased unsteadiness and gait path deviations with SPC, educated on use of RW upon returning home, verbalized understanding. Overall the patient demonstrated mild deficits compared to baseline and would benefit from HHPT to maximize safety, independence, and mobility.     Follow Up Recommendations Home health PT    Equipment Recommendations  None recommended by PT    Recommendations for Other Services       Precautions / Restrictions Precautions Precautions: Fall Precaution Comments: low fall Restrictions Weight Bearing Restrictions: No      Mobility  Bed Mobility               General bed mobility comments: Pt up in chair at start of session  Transfers Overall transfer level: Needs assistance Equipment used: Rolling walker (2 wheeled) Transfers: Sit to/from Stand Sit to Stand: Min guard            Ambulation/Gait   Gait Distance (Feet): 120 Feet Assistive device: Rolling walker (2 wheeled);Straight  cane       General Gait Details: some occasional gait path deviation noted with SPC, able to correct modI. cued for RW use at home until she feels she has returned to her PLOF.  Stairs            Wheelchair Mobility    Modified Rankin (Stroke Patients Only)       Balance Overall balance assessment: Needs assistance;History of Falls Sitting-balance support: Feet supported Sitting balance-Leahy Scale: Good     Standing balance support: Single extremity supported Standing balance-Leahy Scale: Fair Standing balance comment: improved safety noted with RW use for ambulation                             Pertinent Vitals/Pain Pain Assessment: No/denies pain    Home Living Family/patient expects to be discharged to:: Private residence Living Arrangements: Alone Available Help at Discharge: Family;Available PRN/intermittently (daughter and son available and check on pt) Type of Home: House Home Access: Ramped entrance     Home Layout: Able to live on main level with bedroom/bathroom Home Equipment: Walker - 2 wheels;Cane - single point;Grab bars - tub/shower;Wheelchair - manual      Prior Function Level of Independence: Independent with assistive device(s)         Comments: Pt reports generally independent with ADL and IADL, uses a SPC at night for toileting, still drives, 1 fall in past 12 months (came in from yard picking up sticks and passed out in her kitchen).     Hand Dominance   Dominant  Hand: Right    Extremity/Trunk Assessment   Upper Extremity Assessment Upper Extremity Assessment: Generalized weakness;Defer to OT evaluation    Lower Extremity Assessment Lower Extremity Assessment: Generalized weakness (WFLs for age)    Cervical / Trunk Assessment Cervical / Trunk Assessment: Normal  Communication   Communication: No difficulties  Cognition Arousal/Alertness: Awake/alert Behavior During Therapy: WFL for tasks  assessed/performed Overall Cognitive Status: Within Functional Limits for tasks assessed                                        General Comments      Exercises Other Exercises Other Exercises: Pt able to stand for several minutes while speaking with MD with support of RW, CGA/supervision Other Exercises: Pt able to utilize standard commode with grab bars, supervision for pericare, and wash her hands standing at the sink   Assessment/Plan    PT Assessment Patient needs continued PT services  PT Problem List Decreased strength;Decreased mobility;Decreased activity tolerance;Decreased balance;Decreased knowledge of use of DME       PT Treatment Interventions DME instruction;Therapeutic exercise;Gait training;Balance training;Neuromuscular re-education;Functional mobility training;Therapeutic activities;Patient/family education    PT Goals (Current goals can be found in the Care Plan section)  Acute Rehab PT Goals Patient Stated Goal: to go home PT Goal Formulation: With patient Time For Goal Achievement: 03/03/20 Potential to Achieve Goals: Good    Frequency Min 2X/week   Barriers to discharge        Co-evaluation               AM-PAC PT "6 Clicks" Mobility  Outcome Measure Help needed turning from your back to your side while in a flat bed without using bedrails?: None Help needed moving from lying on your back to sitting on the side of a flat bed without using bedrails?: None Help needed moving to and from a bed to a chair (including a wheelchair)?: None Help needed standing up from a chair using your arms (e.g., wheelchair or bedside chair)?: None Help needed to walk in hospital room?: A Little Help needed climbing 3-5 steps with a railing? : A Little 6 Click Score: 22    End of Session Equipment Utilized During Treatment: Gait belt Activity Tolerance: Patient tolerated treatment well Patient left: in chair;with call bell/phone within reach;with  chair alarm set;with family/visitor present Nurse Communication: Mobility status PT Visit Diagnosis: Other abnormalities of gait and mobility (R26.89);Muscle weakness (generalized) (M62.81);Difficulty in walking, not elsewhere classified (R26.2)    Time: 0086-7619 PT Time Calculation (min) (ACUTE ONLY): 26 min   Charges:   PT Evaluation $PT Eval Low Complexity: 1 Low PT Treatments $Therapeutic Exercise: 8-22 mins        Lieutenant Diego PT, DPT 10:01 AM,02/18/20

## 2020-02-18 NOTE — Progress Notes (Signed)
[facility] Cardiology    SUBJECTIVE: The patient reports feeling much better today. She denies chest pain or palpitations. She states that her breathing is back to her baseline.   Vitals:   02/17/20 1540 02/18/20 0001 02/18/20 0500 02/18/20 0808  BP: 107/68 115/70  124/66  Pulse: 90 93  83  Resp: 16   16  Temp: 98.7 F (37.1 C) (!) 97.5 F (36.4 C)  98.2 F (36.8 C)  TempSrc: Oral Oral  Oral  SpO2: 96% 97%  95%  Weight:   64.4 kg   Height:         Intake/Output Summary (Last 24 hours) at 02/18/2020 8416 Last data filed at 02/18/2020 0500 Gross per 24 hour  Intake 360 ml  Output 800 ml  Net -440 ml      PHYSICAL EXAM  General: Well developed, well nourished, in no acute distress, sitting up in recliner eating breakfast HEENT:  Normocephalic and atramatic Neck:  No JVD.  Lungs: Clear bilaterally to auscultation, normal effort of breathing on room air. Heart: irregularly irregular  Abdomen: nondistended Msk:  Back normal, gait not assessed. Normal strength and tone for age. Extremities: No clubbing, cyanosis or edema.   Neuro: Alert and oriented X 3. Psych:  Good affect, responds appropriately   LABS: Basic Metabolic Panel: Recent Labs    02/17/20 0300 02/18/20 0335  NA 134* 137  K 3.7 4.0  CL 96* 97*  CO2 28 29  GLUCOSE 105* 96  BUN 18 28*  CREATININE 1.11* 1.45*  CALCIUM 8.9 8.7*  MG 1.9 2.0   Liver Function Tests: Recent Labs    02/16/20 0127  AST 51*  ALT 30  ALKPHOS 111  BILITOT 0.7  PROT 7.2  ALBUMIN 4.1   No results for input(s): LIPASE, AMYLASE in the last 72 hours. CBC: Recent Labs    02/16/20 0127 02/16/20 0127 02/17/20 0300 02/18/20 0335  WBC 8.4   < > 7.8 8.2  NEUTROABS 6.2  --   --   --   HGB 11.7*   < > 11.8* 11.6*  HCT 35.4*   < > 35.0* 35.4*  MCV 92.9   < > 91.1 93.4  PLT 185   < > 183 190   < > = values in this interval not displayed.   Cardiac Enzymes: No results for input(s): CKTOTAL, CKMB, CKMBINDEX, TROPONINI in the  last 72 hours. BNP: Invalid input(s): POCBNP D-Dimer: No results for input(s): DDIMER in the last 72 hours. Hemoglobin A1C: No results for input(s): HGBA1C in the last 72 hours. Fasting Lipid Panel: No results for input(s): CHOL, HDL, LDLCALC, TRIG, CHOLHDL, LDLDIRECT in the last 72 hours. Thyroid Function Tests: No results for input(s): TSH, T4TOTAL, T3FREE, THYROIDAB in the last 72 hours.  Invalid input(s): FREET3 Anemia Panel: No results for input(s): VITAMINB12, FOLATE, FERRITIN, TIBC, IRON, RETICCTPCT in the last 72 hours.  ECHOCARDIOGRAM COMPLETE  Result Date: 02/16/2020    ECHOCARDIOGRAM REPORT   Patient Name:   Allison Novak Date of Exam: 02/16/2020 Medical Rec #:  [id number]       Height:       63.0 in Accession #:    [id number]      Weight:       140.0 lb Date of Birth:  1925-07-23       BSA:          1.662 m Patient Age:    86 years        BP:  159/94 mmHg Patient Gender: F               HR:           99 bpm. Exam Location:  [facility] Procedure: 2D Echo, Color Doppler and Cardiac Doppler Indications:     I50.31 CHF-Acute Diastolic  History:         Patient has no prior history of Echocardiogram examinations.                  CAD; Risk Factors:Hypertension.  Sonographer:     Charmayne Sheer RDCS (AE) Referring Phys:  [id number] Athena Masse Diagnosing Phys: Isaias Cowman MD  Sonographer Comments: Suboptimal subcostal window. IMPRESSIONS  1. Left ventricular ejection fraction, by estimation, is 45 to 50%. The left ventricle has mildly decreased function. The left ventricle has no regional wall motion abnormalities. Left ventricular diastolic parameters are consistent with Grade I diastolic dysfunction (impaired relaxation).  2. Right ventricular systolic function is normal. The right ventricular size is normal.  3. Left atrial size was mildly dilated.  4. Right atrial size was moderately dilated.  5. The mitral valve is normal in structure. Mild to moderate mitral valve  regurgitation. No evidence of mitral stenosis.  6. Tricuspid valve regurgitation is mild to moderate.  7. The aortic valve is normal in structure. Aortic valve regurgitation is mild. No aortic stenosis is present.  8. The inferior vena cava is normal in size with greater than 50% respiratory variability, suggesting right atrial pressure of 3 mmHg. FINDINGS  Left Ventricle: Left ventricular ejection fraction, by estimation, is 45 to 50%. The left ventricle has mildly decreased function. The left ventricle has no regional wall motion abnormalities. The left ventricular internal cavity size was normal in size. There is no left ventricular hypertrophy. Left ventricular diastolic parameters are consistent with Grade I diastolic dysfunction (impaired relaxation). Right Ventricle: The right ventricular size is normal. No increase in right ventricular wall thickness. Right ventricular systolic function is normal. Left Atrium: Left atrial size was mildly dilated. Right Atrium: Right atrial size was moderately dilated. Pericardium: There is no evidence of pericardial effusion. Mitral Valve: The mitral valve is normal in structure. Mild to moderate mitral valve regurgitation. No evidence of mitral valve stenosis. MV peak gradient, 3.3 mmHg. The mean mitral valve gradient is 1.0 mmHg. Tricuspid Valve: The tricuspid valve is normal in structure. Tricuspid valve regurgitation is mild to moderate. No evidence of tricuspid stenosis. Aortic Valve: The aortic valve is normal in structure. Aortic valve regurgitation is mild. No aortic stenosis is present. Aortic valve mean gradient measures 2.0 mmHg. Aortic valve peak gradient measures 4.3 mmHg. Aortic valve area, by VTI measures 1.45 cm. Pulmonic Valve: The pulmonic valve was normal in structure. Pulmonic valve regurgitation is not visualized. No evidence of pulmonic stenosis. Aorta: The aortic root is normal in size and structure. Venous: The inferior vena cava is normal in size  with greater than 50% respiratory variability, suggesting right atrial pressure of 3 mmHg. IAS/Shunts: No atrial level shunt detected by color flow Doppler.  LEFT VENTRICLE PLAX 2D LVIDd:         4.65 cm  Diastology LVIDs:         3.04 cm  LV e' medial:    8.16 cm/s LV PW:         0.94 cm  LV E/e' medial:  10.8 LV IVS:        0.77 cm  LV e' lateral:   10.90 cm/s  LVOT diam:     2.00 cm  LV E/e' lateral: 8.1 LV SV:         28 LV SV Index:   17 LVOT Area:     3.14 cm  RIGHT VENTRICLE RV Basal diam:  4.00 cm LEFT ATRIUM             Index       RIGHT ATRIUM           Index LA diam:        3.30 cm 1.99 cm/m  RA Area:     24.70 cm LA Vol (A2C):   35.5 ml 21.36 ml/m RA Volume:   85.40 ml  51.39 ml/m LA Vol (A4C):   47.7 ml 28.71 ml/m LA Biplane Vol: 44.4 ml 26.72 ml/m  AORTIC VALVE                   PULMONIC VALVE AV Area (Vmax):    1.87 cm    PV Vmax:       0.85 m/s AV Area (Vmean):   1.71 cm    PV Vmean:      58.300 cm/s AV Area (VTI):     1.45 cm    PV VTI:        0.132 m AV Vmax:           104.00 cm/s PV Peak grad:  2.9 mmHg AV Vmean:          71.700 cm/s PV Mean grad:  1.0 mmHg AV VTI:            0.194 m AV Peak Grad:      4.3 mmHg AV Mean Grad:      2.0 mmHg LVOT Vmax:         62.00 cm/s LVOT Vmean:        39.100 cm/s LVOT VTI:          0.090 m LVOT/AV VTI ratio: 0.46  AORTA Ao Root diam: 3.00 cm MITRAL VALVE               TRICUSPID VALVE MV Area (PHT): 4.24 cm    TR Peak grad:   22.1 mmHg MV Peak grad:  3.3 mmHg    TR Vmax:        235.00 cm/s MV Mean grad:  1.0 mmHg MV Vmax:       0.90 m/s    SHUNTS MV Vmean:      51.6 cm/s   Systemic VTI:  0.09 m MV Decel Time: 179 msec    Systemic Diam: 2.00 cm MV E velocity: 88.13 cm/s MV A velocity: 89.10 cm/s MV E/A ratio:  0.99 Isaias Cowman MD Electronically signed by Isaias Cowman MD Signature Date/Time: 02/16/2020/2:55:12 PM    Final      Echo LVEF 45-50%, no wall motion abnormalities, mild to moderate MR, TR, mild AI  TELEMETRY: atrial  fibrillation, rate 90s  ASSESSMENT AND PLAN:  Principal Problem:   Acute CHF (congestive heart failure) (HCC) Active Problems:   History of TIA (transient ischemic attack)   Essential hypertension   Hyperlipidemia   Atrial fibrillation with RVR (HCC)   Hypoxia   Chronic anticoagulation   Aortic atherosclerosis (HCC)   Carotid artery disease (HCC)   1.Atrial fibrillation with rapid ventricular rate, on Eliquis for stroke prevention, and metoprolol succinate for rate control. Digoxin recently discontinued due to toxicity and elevated levels. Heart rate better controlled on increased dose of metoprolol succinate to 200  mg daily. 2.Acute diastolic congestive heart failure, improved after diuresis with intravenous furosemide. Appears euvolemic 3.Essential hypertension, stable on metoprolol, losartan and Lasix 4.History of TIA  Recommendations: 1. Continue Eliquis 5 mg BID for stroke prevention 2. Recommend discharge with Lasix 20 mg daily 3. Continue to hold digoxin 4. Continue metoprolol succinate 200 mg daily 5. Patient appears stable from cardiovascular perspective for discharge today, and follow-up with Dr. Ubaldo Glassing in about 1 week.   Clabe Seal, PA-C 02/18/2020 8:42 AM

## 2020-02-26 ENCOUNTER — Ambulatory Visit: Payer: Medicare Other | Admitting: Family

## 2020-08-03 ENCOUNTER — Emergency Department: Payer: Medicare Other

## 2020-08-03 ENCOUNTER — Encounter: Payer: Self-pay | Admitting: Emergency Medicine

## 2020-08-03 ENCOUNTER — Observation Stay
Admit: 2020-08-03 | Discharge: 2020-08-03 | Disposition: A | Discharge status: Home / Self Care | Payer: Medicare Other | Attending: Internal Medicine | Admitting: Internal Medicine

## 2020-08-03 ENCOUNTER — Inpatient Hospital Stay
Admission: EM | Admit: 2020-08-03 | Discharge: 2020-08-06 | DRG: 291 | Disposition: A | Discharge status: Home Health | Payer: Medicare Other | Attending: Internal Medicine | Admitting: Internal Medicine

## 2020-08-03 ENCOUNTER — Other Ambulatory Visit: Payer: Self-pay

## 2020-08-03 DIAGNOSIS — I5021 Acute systolic (congestive) heart failure: Secondary | ICD-10-CM | POA: Diagnosis not present

## 2020-08-03 DIAGNOSIS — M109 Gout, unspecified: Secondary | ICD-10-CM | POA: Diagnosis present

## 2020-08-03 DIAGNOSIS — Z90710 Acquired absence of both cervix and uterus: Secondary | ICD-10-CM

## 2020-08-03 DIAGNOSIS — M810 Age-related osteoporosis without current pathological fracture: Secondary | ICD-10-CM | POA: Diagnosis present

## 2020-08-03 DIAGNOSIS — Z8673 Personal history of transient ischemic attack (TIA), and cerebral infarction without residual deficits: Secondary | ICD-10-CM

## 2020-08-03 DIAGNOSIS — Z8249 Family history of ischemic heart disease and other diseases of the circulatory system: Secondary | ICD-10-CM

## 2020-08-03 DIAGNOSIS — I5023 Acute on chronic systolic (congestive) heart failure: Secondary | ICD-10-CM

## 2020-08-03 DIAGNOSIS — Z20822 Contact with and (suspected) exposure to covid-19: Secondary | ICD-10-CM | POA: Diagnosis present

## 2020-08-03 DIAGNOSIS — I482 Chronic atrial fibrillation, unspecified: Secondary | ICD-10-CM | POA: Diagnosis not present

## 2020-08-03 DIAGNOSIS — Z881 Allergy status to other antibiotic agents status: Secondary | ICD-10-CM

## 2020-08-03 DIAGNOSIS — E878 Other disorders of electrolyte and fluid balance, not elsewhere classified: Secondary | ICD-10-CM | POA: Diagnosis not present

## 2020-08-03 DIAGNOSIS — K219 Gastro-esophageal reflux disease without esophagitis: Secondary | ICD-10-CM | POA: Diagnosis not present

## 2020-08-03 DIAGNOSIS — I130 Hypertensive heart and chronic kidney disease with heart failure and stage 1 through stage 4 chronic kidney disease, or unspecified chronic kidney disease: Principal | ICD-10-CM | POA: Diagnosis present

## 2020-08-03 DIAGNOSIS — I4821 Permanent atrial fibrillation: Secondary | ICD-10-CM | POA: Diagnosis present

## 2020-08-03 DIAGNOSIS — I700 Atherosclerosis of aorta: Secondary | ICD-10-CM | POA: Diagnosis present

## 2020-08-03 DIAGNOSIS — E782 Mixed hyperlipidemia: Secondary | ICD-10-CM

## 2020-08-03 DIAGNOSIS — T501X5A Adverse effect of loop [high-ceiling] diuretics, initial encounter: Secondary | ICD-10-CM | POA: Diagnosis not present

## 2020-08-03 DIAGNOSIS — Z79899 Other long term (current) drug therapy: Secondary | ICD-10-CM

## 2020-08-03 DIAGNOSIS — I779 Disorder of arteries and arterioles, unspecified: Secondary | ICD-10-CM | POA: Diagnosis present

## 2020-08-03 DIAGNOSIS — I5043 Acute on chronic combined systolic (congestive) and diastolic (congestive) heart failure: Secondary | ICD-10-CM | POA: Diagnosis present

## 2020-08-03 DIAGNOSIS — I509 Heart failure, unspecified: Secondary | ICD-10-CM

## 2020-08-03 DIAGNOSIS — N183 Chronic kidney disease, stage 3 unspecified: Secondary | ICD-10-CM | POA: Diagnosis present

## 2020-08-03 DIAGNOSIS — Z7982 Long term (current) use of aspirin: Secondary | ICD-10-CM

## 2020-08-03 DIAGNOSIS — K5790 Diverticulosis of intestine, part unspecified, without perforation or abscess without bleeding: Secondary | ICD-10-CM | POA: Diagnosis present

## 2020-08-03 DIAGNOSIS — J42 Unspecified chronic bronchitis: Secondary | ICD-10-CM | POA: Diagnosis present

## 2020-08-03 DIAGNOSIS — I2510 Atherosclerotic heart disease of native coronary artery without angina pectoris: Secondary | ICD-10-CM | POA: Diagnosis present

## 2020-08-03 DIAGNOSIS — R0602 Shortness of breath: Secondary | ICD-10-CM | POA: Diagnosis present

## 2020-08-03 DIAGNOSIS — Z7901 Long term (current) use of anticoagulants: Secondary | ICD-10-CM

## 2020-08-03 DIAGNOSIS — E785 Hyperlipidemia, unspecified: Secondary | ICD-10-CM | POA: Diagnosis present

## 2020-08-03 DIAGNOSIS — I10 Essential (primary) hypertension: Secondary | ICD-10-CM | POA: Diagnosis not present

## 2020-08-03 DIAGNOSIS — N179 Acute kidney failure, unspecified: Secondary | ICD-10-CM | POA: Diagnosis not present

## 2020-08-03 DIAGNOSIS — J9601 Acute respiratory failure with hypoxia: Secondary | ICD-10-CM | POA: Diagnosis present

## 2020-08-03 HISTORY — DX: Heart failure, unspecified: I50.9

## 2020-08-03 LAB — BASIC METABOLIC PANEL
Anion gap: 12 (ref 5–15)
BUN: 15 mg/dL (ref 8–23)
CO2: 24 mmol/L (ref 22–32)
Calcium: 9.3 mg/dL (ref 8.9–10.3)
Chloride: 99 mmol/L (ref 98–111)
Creatinine, Ser: 0.94 mg/dL (ref 0.44–1.00)
GFR, Estimated: 57 mL/min — ABNORMAL LOW (ref >60)
Glucose, Bld: 119 mg/dL — ABNORMAL HIGH (ref 70–99)
Potassium: 3.8 mmol/L (ref 3.5–5.1)
Sodium: 135 mmol/L (ref 135–145)

## 2020-08-03 LAB — CBC
HCT: 40.8 % (ref 36.0–46.0)
Hemoglobin: 13.2 g/dL (ref 12.0–15.0)
MCH: 28.9 pg (ref 26.0–34.0)
MCHC: 32.4 g/dL (ref 30.0–36.0)
MCV: 89.5 fL (ref 80.0–100.0)
Platelets: 209 10*3/uL (ref 150–400)
RBC: 4.56 MIL/uL (ref 3.87–5.11)
RDW: 18.8 % — ABNORMAL HIGH (ref 11.5–15.5)
WBC: 14.5 10*3/uL — ABNORMAL HIGH (ref 4.0–10.5)
nRBC: 0 % (ref 0.0–0.2)

## 2020-08-03 LAB — TSH: TSH: 2.805 u[IU]/mL (ref 0.350–4.500)

## 2020-08-03 LAB — BRAIN NATRIURETIC PEPTIDE: B Natriuretic Peptide: 760.5 pg/mL — ABNORMAL HIGH (ref 0.0–100.0)

## 2020-08-03 LAB — PROCALCITONIN: Procalcitonin: <0.1 ng/mL

## 2020-08-03 LAB — TROPONIN I (HIGH SENSITIVITY): Troponin I (High Sensitivity): 7 ng/L (ref <18)

## 2020-08-03 MED ORDER — METOPROLOL SUCCINATE ER 50 MG PO TB24: 200.0000 mg | ORAL_TABLET | Freq: Every day | ORAL | Status: DC

## 2020-08-03 MED ORDER — ASPIRIN EC 81 MG PO TBEC
81.0000 mg | DELAYED_RELEASE_TABLET | Freq: Every day | ORAL | Status: DC
  Administered 2020-08-04 – 2020-08-06 (×3): 81 mg via ORAL
  Filled 2020-08-03 (×3): qty 1

## 2020-08-03 MED ORDER — SODIUM CHLORIDE 0.9 % IV SOLN: 250.0000 mL | INTRAVENOUS | Status: DC | PRN

## 2020-08-03 MED ORDER — SODIUM CHLORIDE 0.9% FLUSH: 3.0000 mL | INTRAVENOUS | Status: DC | PRN

## 2020-08-03 MED ORDER — SODIUM CHLORIDE 0.9% FLUSH
3.0000 mL | Freq: Two times a day (BID) | INTRAVENOUS | Status: DC
  Administered 2020-08-03 – 2020-08-06 (×7): 3 mL via INTRAVENOUS

## 2020-08-03 MED ORDER — FUROSEMIDE 10 MG/ML IJ SOLN: 60.0000 mg | Freq: Every day | INTRAMUSCULAR | Status: DC

## 2020-08-03 MED ORDER — ALLOPURINOL 100 MG PO TABS
100.0000 mg | ORAL_TABLET | Freq: Every day | ORAL | Status: DC
  Administered 2020-08-04 – 2020-08-06 (×3): 100 mg via ORAL
  Filled 2020-08-03 (×3): qty 1

## 2020-08-03 MED ORDER — LOSARTAN POTASSIUM 50 MG PO TABS: 100.0000 mg | ORAL_TABLET | Freq: Every day | ORAL | Status: DC

## 2020-08-03 MED ORDER — APIXABAN 2.5 MG PO TABS
2.5000 mg | ORAL_TABLET | Freq: Two times a day (BID) | ORAL | Status: DC
  Administered 2020-08-03 – 2020-08-06 (×6): 2.5 mg via ORAL
  Filled 2020-08-03 (×7): qty 1

## 2020-08-03 MED ORDER — ATORVASTATIN CALCIUM 20 MG PO TABS
40.0000 mg | ORAL_TABLET | Freq: Every day | ORAL | Status: DC
  Administered 2020-08-04 – 2020-08-06 (×3): 40 mg via ORAL
  Filled 2020-08-03 (×3): qty 2

## 2020-08-03 MED ORDER — ONDANSETRON HCL 4 MG/2ML IJ SOLN: 4.0000 mg | Freq: Four times a day (QID) | INTRAMUSCULAR | Status: DC | PRN

## 2020-08-03 MED ORDER — PANTOPRAZOLE SODIUM 40 MG PO TBEC
40.0000 mg | DELAYED_RELEASE_TABLET | Freq: Every day | ORAL | Status: DC
  Administered 2020-08-04 – 2020-08-06 (×3): 40 mg via ORAL
  Filled 2020-08-03 (×3): qty 1

## 2020-08-03 MED ORDER — OXYBUTYNIN CHLORIDE 5 MG PO TABS
5.0000 mg | ORAL_TABLET | Freq: Every day | ORAL | Status: DC
  Administered 2020-08-03 – 2020-08-05 (×3): 5 mg via ORAL
  Filled 2020-08-03 (×5): qty 1

## 2020-08-03 MED ORDER — METOPROLOL SUCCINATE ER 50 MG PO TB24
200.0000 mg | ORAL_TABLET | Freq: Once | ORAL | Status: AC
  Administered 2020-08-03: 200 mg via ORAL
  Filled 2020-08-03: qty 4

## 2020-08-03 MED ORDER — FUROSEMIDE 10 MG/ML IJ SOLN
40.0000 mg | Freq: Once | INTRAMUSCULAR | Status: AC
  Administered 2020-08-03: 40 mg via INTRAVENOUS
  Filled 2020-08-03: qty 4

## 2020-08-03 MED ORDER — ACETAMINOPHEN 325 MG PO TABS
650.0000 mg | ORAL_TABLET | ORAL | Status: DC | PRN
  Administered 2020-08-05: 650 mg via ORAL
  Filled 2020-08-03: qty 2

## 2020-08-03 NOTE — H&P (Signed)
History and Physical   Allison Novak FHL:[id number] DOB: 1925/10/30 DOA: 08/03/2020  PCP: Adin Hector, MD  Outpatient Specialists: Dr. Ubaldo Glassing [facility] Patient coming from: home  I have personally briefly reviewed patient's old medical records in [place].  Chief Concern: Shortness of breath  HPI: Allison Novak is a 87 y.o. female with medical history significant for heart failure reduced ejection fraction, hypertension, hyperlipidemia, history of atrial fibrillation on Eliquis, history of TIA, coronary artery disease and aortic atherosclerosis, carotid artery disease, presents to the emergency department for chief concerns of worsening shortness of breath.  She reports that shortness of breath has been ongoing for the last several months however the last 2 to 3 days it has been worsening.  She states she is compliant with less than 2 L of p.o. liquid intake.  She denies sick contacts.  She also endorses that she thinks she has gained 3 to 4 pounds in the last week.  She also endorses a cough that is not productive.  She states that shortness of breath is worse with exertion and worse when she lays flat.  She denies fever, chills, chest pain, dysuria, hematuria, nausea, vomiting.  Social history: She lives by herself and is able to perform most of her ADLs.  She denies tobacco, EtOH, recreational drug use.  Vaccination: She is vaccinated for COVID-19, 2 doses  At bedside she was able to tell me her name, her age, identified her daughter at bedside, and that she is in the hospital.  She states that the treatment given by the ED provider has improved her symptoms.  She states she did not take her a.m. medications.  ROS: Constitutional: no weight change, no fever ENT/Mouth: no sore throat, no rhinorrhea Eyes: no eye pain, no vision changes Cardiovascular: no chest pain, + dyspnea,  no edema, no palpitations Respiratory: + cough, no sputum, no wheezing Gastrointestinal:  no nausea, no vomiting, no diarrhea, no constipation Genitourinary: no urinary incontinence, no dysuria, no hematuria Musculoskeletal: no arthralgias, no myalgias Skin: no skin lesions, no pruritus, Neuro: + weakness, no loss of consciousness, no syncope Psych: no anxiety, no depression, + decrease appetite Heme/Lymph: no bruising, no bleeding  ED Course: Discussed with ED provider, patient requiring hospitalization due to heart failure exacerbation.  Vitals in the emergency department was remarkable for temperature of 97.8, respiration rate of 20, heart rate of 91, blood pressure 145/75, patient satting at 94% on room air.  Labs in the emergency department was remarkable for serum sodium of 135, potassium 3.8, chloride 99, bicarb 24, BUN of 15, serum creatinine of 0.94, nonfasting blood glucose of 119.  BNP elevated at 760. Troponin high-sensitivity is 7, TSH was within normal limits, pro-Cal less than 0.10.  She is status post Lasix 40 mg IV once, metoprolol succinate 200 mg once per ED provider.  Assessment/Plan  Active Problems:   GERD (gastroesophageal reflux disease)   History of TIA (transient ischemic attack)   Essential hypertension   Hyperlipidemia   Acute CHF (congestive heart failure) (HCC)   Chronic anticoagulation   Carotid artery disease (HCC)   Shortness of breath   Atrial fibrillation, chronic (HCC)   # Systolic heart failure exacerbation-etiology work-up in progress -Status post furosemide 40 mg IV once per ED provider -I ordered furosemide 40 mg IV starting 08/04/2020 -Resumed metoprolol succinate 200 mg p.o. for 08/04/2020 -Resumed home losartan 100 mg daily -did not resume home furosemide 20 mg p.o. daily -Echo ordered -Pending  echo result, would recommend a.m. team to consult cardiology for heart failure medication optimization if further indicated  # Atrial fibrillation and RVR-secondary to heart failure exacerbation as above, resumed Eliquis 2.5 mg twice  daily, resumed metoprolol succinate 200 mg p.o.  # Hypertension-losartan 100 mg daily, metoprolol succinate 200 mg p.o. daily  # History of gout-in remission, resumed allopurinol 100 mg daily  # CAD-aspirin 81 mg, atorvastatin 40 mg daily, low clinical suspicion for ACS as patient denies chest pain  # GERD-PPI # As needed medication: Ondansetron 4 mg IV q6h prn for nausea and vomiting, acetaminophen 650 mg q4h prn for mild pain, fever, headache  BMP in a.m., CBC with differential in a.m.  Chart reviewed.   Complete echo in 02/16/2020 was read as ejection fraction 45 to 50%.  Grade 1 diastolic dysfunction.  Left atrial size mildly dilated.  Right atrial size moderately dilated.  Mild to moderate mitral valve regurgitation.  Tricuspid valve regurgitation is mild to moderate.  Aortic valve regurgitation is mild.  No aortic stenosis.  DVT prophylaxis: Eliquis 2.5 mg twice daily, TED hose Code Status: Yes to CPR and ACLS medication and shock, no to intubation Diet: Heart healthy Family Communication: Updated daughter at bedside Disposition Plan: Pending clinical course Consults called: None at this time Admission status: Observation, MedSurg, with telemetry  Past Medical History:  Diagnosis Date  . A-fib ([place])   . CHF (congestive heart failure) ([place])   . Coronary artery disease   . Diverticulosis   . GERD (gastroesophageal reflux disease)   . Gout   . Hypertension   . Osteoporosis    Past Surgical History:  Procedure Laterality Date  . ABDOMINAL HYSTERECTOMY    . ANKLE SURGERY Left 2011  . KNEE ARTHROSCOPY Right    Social History:  reports that she has never smoked. She has never used smokeless tobacco. She reports that she does not drink alcohol and does not use drugs.  Allergies  Allergen Reactions  . Keflex [Cephalexin] Other (See Comments)    Pt cant remember exact reaction  . Macrodantin [Nitrofurantoin Macrocrystal] Other (See Comments)   Family History  Problem  Relation Age of Onset  . CAD Mother   . Hypertension Sister    Family history: Family history reviewed and not pertinent  Prior to Admission medications   Medication Sig Start Date End Date Taking? Authorizing Provider  allopurinol (ZYLOPRIM) 100 MG tablet Take 100 mg by mouth daily.   Yes [provider]  atorvastatin (LIPITOR) 40 MG tablet Take 40 mg by mouth daily. 11/18/16  Yes [provider]  azelastine (ASTELIN) 0.1 % nasal spray Place 1 spray into both nostrils daily. 06/15/20  Yes [provider]  calcium carbonate (OSCAL) 1500 (600 Ca) MG TABS tablet Take 1 tablet by mouth in the morning and at bedtime.   Yes [provider]  Cholecalciferol 50 MCG (2000 UT) CAPS Take 2,000 Units by mouth daily.   Yes [provider]  diltiazem (CARDIZEM CD) 120 MG 24 hr capsule Take 120 mg by mouth daily. 07/21/20  Yes [provider]  ELIQUIS 2.5 MG TABS tablet Take 2.5 mg by mouth 2 (two) times daily. 02/13/20  Yes [provider]  furosemide (LASIX) 20 MG tablet Take 1 tablet (20 mg total) by mouth daily. 02/18/20 02/17/21 Yes Lorella Nimrod, MD  losartan (COZAAR) 100 MG tablet Take 1 tablet (100 mg total) by mouth daily. 06/24/15  Yes Dustin Flock, MD  metoprolol succinate (TOPROL-XL) 200  MG 24 hr tablet Take 1 tablet (200 mg total) by mouth daily. Take with or immediately following a meal. 02/18/20  Yes Lorella Nimrod, MD  omeprazole (PRILOSEC) 40 MG capsule Take 40 mg by mouth daily.    Yes [provider]  oxybutynin (DITROPAN) 5 MG tablet Take 5 mg by mouth at bedtime.   Yes [provider]  potassium chloride (KLOR-CON) 10 MEQ tablet Take 1 tablet (10 mEq total) by mouth daily. 02/18/20  Yes Lorella Nimrod, MD   Physical Exam: Vitals:   08/03/20 1930 08/03/20 2030 08/03/20 2100 08/03/20 2200  BP: 115/68 103/69 122/67 123/61  Pulse: 88 100 96 86  Resp: 20 16 19 14   Temp:      TempSrc:      SpO2: 98% 96% 96% 96%   Weight:      Height:       Constitutional: appears age-appropriate, NAD, calm, comfortable Eyes: PERRL, lids and conjunctivae normal ENMT: Mucous membranes are moist. Posterior pharynx clear of any exudate or lesions. Age-appropriate dentition. Hearing appropriate Neck: normal, supple, no masses, no thyromegaly Respiratory: clear to auscultation bilaterally, no wheezing, no crackles. Normal respiratory effort. No accessory muscle use.  Cardiovascular: Regular rate and rhythm, no murmurs / rubs / gallops. No extremity edema. 2+ pedal pulses. No carotid bruits.  Abdomen: no tenderness, no masses palpated, no hepatosplenomegaly. Bowel sounds positive.  Musculoskeletal: no clubbing / cyanosis. No joint deformity upper and lower extremities. Good ROM, no contractures, no atrophy. Normal muscle tone.  Skin: no rashes, lesions, ulcers. No induration Neurologic: Sensation intact. Strength 5/5 in all 4.  Psychiatric: Normal judgment and insight. Alert and oriented x 3. Normal mood.   EKG: independently reviewed, showing atrial fibrillation with RVR, QTc 469  Chest x-ray on Admission: I personally reviewed and I agree with radiologist reading as below.  DG Chest 2 View  Result Date: 08/03/2020 CLINICAL DATA:  Orthopnea for 3 months. Cough and worsening shortness of breath. EXAM: CHEST - 2 VIEW COMPARISON:  02/16/2020 FINDINGS: Cardiomegaly again noted. Increased diffuse interstitial infiltrates are suspicious for interstitial edema. Increased bibasilar atelectasis is seen as well as tiny bilateral pleural effusions. No evidence of pulmonary consolidation. IMPRESSION: Increased diffuse interstitial infiltrates, suspicious for interstitial edema. Increased bibasilar atelectasis and tiny bilateral pleural effusions. Stable cardiomegaly. Electronically Signed   By: Marlaine Hind M.D.   On: 08/03/2020 10:55   Labs on Admission: I have personally reviewed following labs  CBC: Recent Labs  Lab  08/03/20 1032  WBC 14.5*  HGB 13.2  HCT 40.8  MCV 89.5  PLT 854   Basic Metabolic Panel: Recent Labs  Lab 08/03/20 1032  NA 135  K 3.8  CL 99  CO2 24  GLUCOSE 119*  BUN 15  CREATININE 0.94  CALCIUM 9.3   GFR: Estimated Creatinine Clearance: 34.2 mL/min (by C-G formula based on SCr of 0.94 mg/dL).  Thyroid Function Tests: Recent Labs    08/03/20 1032  TSH 2.805   Anemia Panel: No results for input(s): VITAMINB12, FOLATE, FERRITIN, TIBC, IRON, RETICCTPCT in the last 72 hours.  Urine analysis:    Component Value Date/Time   COLORURINE COLORLESS (A) 02/18/2017 1318   APPEARANCEUR CLEAR (A) 02/18/2017 1318   LABSPEC 1.002 (L) 02/18/2017 1318   PHURINE 7.0 02/18/2017 1318   GLUCOSEU NEGATIVE 02/18/2017 1318   HGBUR NEGATIVE 02/18/2017 [place] 02/18/2017 [place] 02/18/2017 1318   PROTEINUR NEGATIVE 02/18/2017 1318   NITRITE NEGATIVE 02/18/2017  [place] 02/18/2017 [place] [facility]  If 7PM-7AM, please contact overnight-coverage provider If 7AM-7PM, please contact day coverage provider [email or web address]  08/04/2020, 12:30 AM

## 2020-08-03 NOTE — H&P (Incomplete)
History and Physical   [name] RDE:[id number] DOB: March 31, 1926 DOA: 08/03/2020  PCP: Adin Hector, MD (Confirm with patient/family/NH records and if not entered, this has to be entered at [facility] point of entry) Outpatient Specialists: *** [facility] speciality and name if known) Patient coming from: ***  I have personally briefly reviewed patient's old medical records in [place].  Chief Concern: ***  HPI: Allison Novak is a 87 y.o. female with medical history significant for ***   (The initial 2-3 lines should be focused and good to copy and paste in the HPI section of the daily progress note).   (For level 3, the HPI must include 4+ descriptors: Location, Quality, Severity, Duration, Timing, Context, modifying factors, associated signs/symptoms and/or status of 3+ chronic problems.)   ROS:*** Constitutional: no weight change, no fever ENT/Mouth: no sore throat, no rhinorrhea Eyes: no eye pain, no vision changes Cardiovascular: no chest pain, no dyspnea,  no edema, no palpitations Respiratory: no cough, no sputum, no wheezing Gastrointestinal: no nausea, no vomiting, no diarrhea, no constipation Genitourinary: no urinary incontinence, no dysuria, no hematuria Musculoskeletal: no arthralgias, no myalgias Skin: no skin lesions, no pruritus, Neuro: + weakness, no loss of consciousness, no syncope Psych: no anxiety, no depression, + decrease appetite Heme/Lymph: no bruising, no bleeding  ED Course: ***  Assessment/Plan  Active Problems:   Shortness of breath    *** Chart reviewed.   DVT prophylaxis: ***  Code Status: ***  Diet: *** Family Communication: ***  Disposition Plan: ***  Consults called: ***  Admission status: ***   Past Medical History:  Diagnosis Date  . A-fib ([place])   . CHF (congestive heart failure) ([place])   . Coronary artery disease   . Diverticulosis   . GERD (gastroesophageal reflux disease)   . Gout   . Hypertension   .  Osteoporosis     Past Surgical History:  Procedure Laterality Date  . ABDOMINAL HYSTERECTOMY    . ANKLE SURGERY Left 2011  . KNEE ARTHROSCOPY Right     Social History:  reports that she has never smoked. She has never used smokeless tobacco. She reports that she does not drink alcohol and does not use drugs.  Allergies  Allergen Reactions  . Keflex [Cephalexin] Other (See Comments)    Pt cant remember exact reaction  . Macrodantin [Nitrofurantoin Macrocrystal] Other (See Comments)   Family History  Problem Relation Age of Onset  . CAD Mother   . Hypertension Sister    Family history: Family history reviewed and not pertinent***  Prior to Admission medications   Medication Sig Start Date End Date Taking? Authorizing Provider  allopurinol (ZYLOPRIM) 100 MG tablet Take 100 mg by mouth daily.   Yes [provider]  atorvastatin (LIPITOR) 40 MG tablet Take 40 mg by mouth daily. 11/18/16  Yes [provider]  azelastine (ASTELIN) 0.1 % nasal spray Place 1 spray into both nostrils daily. 06/15/20  Yes [provider]  calcium carbonate (OSCAL) 1500 (600 Ca) MG TABS tablet Take 1 tablet by mouth in the morning and at bedtime.   Yes [provider]  Cholecalciferol 50 MCG (2000 UT) CAPS Take 2,000 Units by mouth daily.   Yes [provider]  diltiazem (CARDIZEM CD) 120 MG 24 hr capsule Take 120 mg by mouth daily. 07/21/20  Yes [provider]  ELIQUIS 2.5 MG TABS tablet Take 2.5 mg by mouth 2 (two) times daily. 02/13/20  Yes [provider]  furosemide (LASIX) 20 MG tablet Take 1 tablet (20 mg total) by mouth daily. 02/18/20 02/17/21 Yes Lorella Nimrod, MD  losartan (COZAAR) 100 MG tablet Take 1 tablet (100 mg total) by mouth daily. 06/24/15  Yes Dustin Flock, MD  metoprolol succinate (TOPROL-XL) 200 MG 24 hr tablet Take 1 tablet (200 mg total) by mouth daily. Take with or immediately following a meal. 02/18/20  Yes Lorella Nimrod,  MD  omeprazole (PRILOSEC) 40 MG capsule Take 40 mg by mouth daily.    Yes [provider]  oxybutynin (DITROPAN) 5 MG tablet Take 5 mg by mouth at bedtime.   Yes [provider]  potassium chloride (KLOR-CON) 10 MEQ tablet Take 1 tablet (10 mEq total) by mouth daily. 02/18/20  Yes Lorella Nimrod, MD    Physical Exam: Vitals:   08/03/20 1925 08/03/20 1930 08/03/20 2030 08/03/20 2100  BP: 128/81 115/68 103/69 122/67  Pulse: 100 88 100 96  Resp: 18 20 16 19   Temp:      TempSrc:      SpO2: 97% 98% 96% 96%  Weight:      Height:        Constitutional: appears ***, NAD, calm, comfortable Eyes: PERRL, lids and conjunctivae normal ENMT: Mucous membranes are moist. Posterior pharynx clear of any exudate or lesions. Age-appropriate dentition. Hearing appropriate/loss*** Neck: normal, supple, no masses, no thyromegaly Respiratory: clear to auscultation bilaterally, no wheezing, no crackles. Normal respiratory effort. No accessory muscle use.  Cardiovascular: Regular rate and rhythm, no murmurs / rubs / gallops. No extremity edema. 2+ pedal pulses. No carotid bruits.  Abdomen: no tenderness, no masses palpated, no hepatosplenomegaly. Bowel sounds positive.  Musculoskeletal: no clubbing / cyanosis. No joint deformity upper and lower extremities. Good ROM, no contractures, no atrophy. Normal muscle tone.  Skin: no rashes, lesions, ulcers. No induration Neurologic: Sensation intact. Strength 5/5 in all 4.  Psychiatric: Normal judgment and insight. Alert and oriented x 3. Normal mood.   EKG: independently reviewed, showing ***  Chest x-ray on Admission: I personally reviewed and I agree*** with radiologist reading as below.  DG Chest 2 View  Result Date: 08/03/2020 CLINICAL DATA:  Orthopnea for 3 months. Cough and worsening shortness of breath. EXAM: CHEST - 2 VIEW COMPARISON:  02/16/2020 FINDINGS: Cardiomegaly again noted. Increased diffuse interstitial infiltrates are  suspicious for interstitial edema. Increased bibasilar atelectasis is seen as well as tiny bilateral pleural effusions. No evidence of pulmonary consolidation. IMPRESSION: Increased diffuse interstitial infiltrates, suspicious for interstitial edema. Increased bibasilar atelectasis and tiny bilateral pleural effusions. Stable cardiomegaly. Electronically Signed   By: Marlaine Hind M.D.   On: 08/03/2020 10:55    Labs on Admission: I have personally reviewed following labs  CBC: Recent Labs  Lab 08/03/20 1032  WBC 14.5*  HGB 13.2  HCT 40.8  MCV 89.5  PLT 419   Basic Metabolic Panel: Recent Labs  Lab 08/03/20 1032  NA 135  K 3.8  CL 99  CO2 24  GLUCOSE 119*  BUN 15  CREATININE 0.94  CALCIUM 9.3   GFR: Estimated Creatinine Clearance: 34.2 mL/min (by C-G formula based on SCr of 0.94 mg/dL). Liver Function Tests: No results for input(s): AST, ALT, ALKPHOS, BILITOT, PROT, ALBUMIN in the last 168 hours. No results for input(s): LIPASE, AMYLASE in the last 168 hours. No results for input(s): AMMONIA in the last 168 hours. Coagulation Profile: No results for input(s): INR, PROTIME in the last 168 hours. Cardiac Enzymes: No results for input(s):  CKTOTAL, CKMB, CKMBINDEX, TROPONINI in the last 168 hours. BNP (last 3 results) No results for input(s): PROBNP in the last 8760 hours. HbA1C: No results for input(s): HGBA1C in the last 72 hours. CBG: No results for input(s): GLUCAP in the last 168 hours. Lipid Profile: No results for input(s): CHOL, HDL, LDLCALC, TRIG, CHOLHDL, LDLDIRECT in the last 72 hours. Thyroid Function Tests: Recent Labs    08/03/20 1032  TSH 2.805   Anemia Panel: No results for input(s): VITAMINB12, FOLATE, FERRITIN, TIBC, IRON, RETICCTPCT in the last 72 hours. Urine analysis:    Component Value Date/Time   COLORURINE COLORLESS (A) 02/18/2017 1318   APPEARANCEUR CLEAR (A) 02/18/2017 1318   LABSPEC 1.002 (L) 02/18/2017 1318   PHURINE 7.0 02/18/2017  1318   GLUCOSEU NEGATIVE 02/18/2017 1318   HGBUR NEGATIVE 02/18/2017 1318   BILIRUBINUR NEGATIVE 02/18/2017 [place] 02/18/2017 1318   PROTEINUR NEGATIVE 02/18/2017 1318   NITRITE NEGATIVE 02/18/2017 1318   LEUKOCYTESUR NEGATIVE 02/18/2017 1318    Amy N Cox D.O. [facility]  If 7PM-7AM, please contact overnight-coverage provider If 7AM-7PM, please contact day coverage provider [email or web address]  08/03/2020, 10:17 PM

## 2020-08-03 NOTE — Progress Notes (Signed)
*  PRELIMINARY RESULTS* Echocardiogram  has been performed.  Allison Novak 08/03/2020, 6:35 PM

## 2020-08-03 NOTE — ED Triage Notes (Signed)
C/O SOB when laying down x 3 months..  Reports chocking on something Saturday.  States yesterday afternoon started to cough and had difficulty catching breath.  States has had the cough for a while.

## 2020-08-03 NOTE — ED Provider Notes (Signed)
[facility] Emergency Department Provider Note   ____________________________________________   Event Date/Time   First MD Initiated Contact with Patient 08/03/20 1119     (approximate)  I have reviewed the triage vital signs and the nursing notes.   HISTORY  Chief Complaint Shortness of Breath    HPI Allison Novak is a 87 y.o. female with past medical history of hypertension, CAD, CHF, A. fib on Eliquis, and gout who presents to the ED complaining of shortness of breath.  Patient reports that she initially felt like she was choking on a cookie or a cracker 2 nights ago.  Since then, she has had progressive difficulty breathing, especially when laying flat or exerting herself.  She is also developed a nonproductive cough, but she denies any fevers or chest pain.  She has been compliant with Lasix as well as a low-salt diet.        Past Medical History:  Diagnosis Date  . A-fib ([place])   . CHF (congestive heart failure) ([place])   . Coronary artery disease   . Diverticulosis   . GERD (gastroesophageal reflux disease)   . Gout   . Hypertension   . Osteoporosis     Patient Active Problem List   Diagnosis Date Noted  . Hypokalemia   . Palpitations   . Hyperlipidemia 02/16/2020  . Atrial fibrillation with RVR ([place]) 02/16/2020  . Hypoxia 02/16/2020  . Acute CHF (congestive heart failure) ([place]) 02/16/2020  . Chronic anticoagulation 02/16/2020  . History of TIA (transient ischemic attack) 02/23/2017  . Carotid artery disease ([place]) 02/23/2017  . TIA (transient ischemic attack) 02/18/2017  . GERD (gastroesophageal reflux disease) 02/18/2017  . Chest pain 06/23/2015  . HTN (hypertension), malignant 06/23/2015  . Essential hypertension 06/16/2015  . Aortic atherosclerosis ([place]) 06/16/2015    Past Surgical History:  Procedure Laterality Date  . ABDOMINAL HYSTERECTOMY    . ANKLE SURGERY Left 2011  . KNEE ARTHROSCOPY Right     Prior to Admission  medications   Medication Sig Start Date End Date Taking? Authorizing Provider  allopurinol (ZYLOPRIM) 100 MG tablet Take 100 mg by mouth daily.    [provider]  aspirin EC 81 MG EC tablet Take 1 tablet (81 mg total) by mouth daily. Patient not taking: Reported on 02/16/2020 06/24/15   Dustin Flock, MD  atorvastatin (LIPITOR) 40 MG tablet Take 40 mg by mouth daily. 11/18/16   [provider]  calcium carbonate (OSCAL) 1500 (600 Ca) MG TABS tablet Take 1 tablet by mouth in the morning and at bedtime.    [provider]  Cholecalciferol 50 MCG (2000 UT) CAPS Take 1 capsule by mouth daily.    [provider]  ELIQUIS 2.5 MG TABS tablet Take 2.5 mg by mouth 2 (two) times daily. 02/13/20   [provider]  furosemide (LASIX) 20 MG tablet Take 1 tablet (20 mg total) by mouth daily. 02/18/20 02/17/21  Lorella Nimrod, MD  losartan (COZAAR) 100 MG tablet Take 1 tablet (100 mg total) by mouth daily. 06/24/15   Dustin Flock, MD  metoprolol succinate (TOPROL-XL) 200 MG 24 hr tablet Take 1 tablet (200 mg total) by mouth daily. Take with or immediately following a meal. 02/18/20   Lorella Nimrod, MD  omeprazole (PRILOSEC) 40 MG capsule Take 40 mg by mouth daily.     [provider]  oxybutynin (DITROPAN) 5 MG tablet Take 5 mg by mouth at bedtime.    [provider]  potassium chloride (KLOR-CON) 10 MEQ tablet Take 1 tablet (10 mEq total) by mouth daily. 02/18/20   Lorella Nimrod, MD    Allergies Keflex [cephalexin] and Macrodantin [nitrofurantoin macrocrystal]  Family History  Problem Relation Age of Onset  . CAD Mother   . Hypertension Sister     Social History Social History   Tobacco Use  . Smoking status: Never Smoker  . Smokeless tobacco: Never Used  Substance Use Topics  . Alcohol use: No    Alcohol/week: 0.0 standard drinks  . Drug use: No    Review of Systems  Constitutional: No fever/chills Eyes: No visual  changes. ENT: No sore throat. Cardiovascular: Denies chest pain. Respiratory: Positive for cough and shortness of breath. Gastrointestinal: No abdominal pain.  No nausea, no vomiting.  No diarrhea.  No constipation. Genitourinary: Negative for dysuria. Musculoskeletal: Negative for back pain. Skin: Negative for rash. Neurological: Negative for headaches, focal weakness or numbness.  ____________________________________________   PHYSICAL EXAM:  VITAL SIGNS: ED Triage Vitals  Enc Vitals Group     BP 08/03/20 1021 (!) 160/104     Pulse Rate 08/03/20 1021 98     Resp 08/03/20 1021 18     Temp 08/03/20 1021 97.8 F (36.6 C)     Temp Source 08/03/20 1021 Oral     SpO2 08/03/20 1021 95 %     Weight 08/03/20 1033 139 lb 3.2 oz (63.1 kg)     Height 08/03/20 1022 5\' 3"  (1.6 m)     Head Circumference --      Peak Flow --      Pain Score 08/03/20 1022 0     Pain Loc --      Pain Edu? --      Excl. in [place]? --     Constitutional: Alert and oriented. Eyes: Conjunctivae are normal. Head: Atraumatic. Nose: No congestion/rhinnorhea. Mouth/Throat: Mucous membranes are moist. Neck: Normal ROM Cardiovascular: Tachycardic, irregularly irregular rhythm. Grossly normal heart sounds. Respiratory: Normal respiratory effort.  No retractions. Lungs with crackles to bilateral bases. Gastrointestinal: Soft and nontender. No distention. Genitourinary: deferred Musculoskeletal: No lower extremity tenderness, trace edema to bilateral lower extremities. Neurologic:  Normal speech and language. No gross focal neurologic deficits are appreciated. Skin:  Skin is warm, dry and intact. No rash noted. Psychiatric: Mood and affect are normal. Speech and behavior are normal.  ____________________________________________   LABS (all labs ordered are listed, but only abnormal results are displayed)  Labs Reviewed  BASIC METABOLIC PANEL - Abnormal; Notable for the following components:      Result Value    Glucose, Bld 119 (*)    GFR, Estimated 57 (*)    All other components within normal limits  CBC - Abnormal; Notable for the following components:   WBC 14.5 (*)    RDW 18.8 (*)    All other components within normal limits  BRAIN NATRIURETIC PEPTIDE - Abnormal; Notable for the following components:   B Natriuretic Peptide 760.5 (*)    All other components within normal limits  SARS CORONAVIRUS 2 (TAT 6-24 HRS)  PROCALCITONIN  TROPONIN I (HIGH SENSITIVITY)   ____________________________________________  EKG  ED ECG REPORT I, Blake Divine, the attending physician, personally viewed and interpreted this ECG.   Date: 08/03/2020  EKG Time: 10:27  Rate: 102  Rhythm: atrial fibrillation, rate 102  Axis: RAD  Intervals:none  ST&T Change: Nonspecific T wave changes   PROCEDURES  Procedure(s) performed (including Critical Care):  .Critical Care Performed by:  Blake Divine, MD Authorized by: Blake Divine, MD   Critical care provider statement:    Critical care time (minutes):  45   Critical care time was exclusive of:  Separately billable procedures and treating other patients and teaching time   Critical care was necessary to treat or prevent imminent or life-threatening deterioration of the following conditions:  Respiratory failure   Critical care was time spent personally by me on the following activities:  Discussions with consultants, evaluation of patient's response to treatment, examination of patient, ordering and performing treatments and interventions, ordering and review of laboratory studies, ordering and review of radiographic studies, pulse oximetry, re-evaluation of patient's condition, obtaining history from patient or surrogate and review of old charts   I assumed direction of critical care for this patient from another provider in my specialty: no     Care discussed with: admitting provider        ____________________________________________   INITIAL IMPRESSION / ASSESSMENT AND PLAN / ED COURSE       87 year old female with past medical history of hypertension, CAD, atrial fibrillation on Eliquis, CHF, and gout who presents to the ED with increasing difficulty breathing the past few days, particularly when she lays flat.  She is not in any respiratory distress, is maintaining O2 sats on room air although is borderline low at around 91 to 92%.  Chest x-ray reviewed by me and shows interstitial changes concerning for pulmonary edema along with tiny pleural effusions.  She does have trace lower extremity edema and we will treat with IV Lasix.  EKG shows atrial fibrillation with mild RVR.  We will give her usual dose of oral metoprolol as she has not yet had that this morning.  We will check troponin, BNP, and procalcitonin given concern for possible aspiration.  While she has a cough, she has no chest pain or fever and pneumonia seems less likely.  Procalcitonin is negative, doubt significant aspiration event and patient's shortness of breath is likely due to CHF exacerbation.  She is feeling slightly better following dose of Lasix but did drop O2 sats and is requiring 2 L nasal cannula.  Case discussed with hospitalist for admission.      ____________________________________________   FINAL CLINICAL IMPRESSION(S) / ED DIAGNOSES  Final diagnoses:  Acute on chronic systolic congestive heart failure (HCC)  SOB (shortness of breath)     ED Discharge Orders    None       Note:  This document was prepared using Dragon voice recognition software and may include unintentional dictation errors.   Blake Divine, MD 08/03/20 [phone]

## 2020-08-04 ENCOUNTER — Other Ambulatory Visit: Payer: Self-pay

## 2020-08-04 DIAGNOSIS — N183 Chronic kidney disease, stage 3 unspecified: Secondary | ICD-10-CM | POA: Diagnosis present

## 2020-08-04 DIAGNOSIS — Z90710 Acquired absence of both cervix and uterus: Secondary | ICD-10-CM | POA: Diagnosis not present

## 2020-08-04 DIAGNOSIS — T501X5A Adverse effect of loop [high-ceiling] diuretics, initial encounter: Secondary | ICD-10-CM | POA: Diagnosis not present

## 2020-08-04 DIAGNOSIS — I509 Heart failure, unspecified: Secondary | ICD-10-CM

## 2020-08-04 DIAGNOSIS — I482 Chronic atrial fibrillation, unspecified: Secondary | ICD-10-CM | POA: Diagnosis not present

## 2020-08-04 DIAGNOSIS — K219 Gastro-esophageal reflux disease without esophagitis: Secondary | ICD-10-CM | POA: Diagnosis present

## 2020-08-04 DIAGNOSIS — I5023 Acute on chronic systolic (congestive) heart failure: Secondary | ICD-10-CM | POA: Diagnosis not present

## 2020-08-04 DIAGNOSIS — I4821 Permanent atrial fibrillation: Secondary | ICD-10-CM | POA: Diagnosis present

## 2020-08-04 DIAGNOSIS — I700 Atherosclerosis of aorta: Secondary | ICD-10-CM | POA: Diagnosis present

## 2020-08-04 DIAGNOSIS — I5021 Acute systolic (congestive) heart failure: Secondary | ICD-10-CM | POA: Diagnosis not present

## 2020-08-04 DIAGNOSIS — I5043 Acute on chronic combined systolic (congestive) and diastolic (congestive) heart failure: Secondary | ICD-10-CM | POA: Diagnosis not present

## 2020-08-04 DIAGNOSIS — Z8249 Family history of ischemic heart disease and other diseases of the circulatory system: Secondary | ICD-10-CM | POA: Diagnosis not present

## 2020-08-04 DIAGNOSIS — I130 Hypertensive heart and chronic kidney disease with heart failure and stage 1 through stage 4 chronic kidney disease, or unspecified chronic kidney disease: Secondary | ICD-10-CM | POA: Diagnosis not present

## 2020-08-04 DIAGNOSIS — M810 Age-related osteoporosis without current pathological fracture: Secondary | ICD-10-CM | POA: Diagnosis present

## 2020-08-04 DIAGNOSIS — M109 Gout, unspecified: Secondary | ICD-10-CM | POA: Diagnosis present

## 2020-08-04 DIAGNOSIS — Z881 Allergy status to other antibiotic agents status: Secondary | ICD-10-CM | POA: Diagnosis not present

## 2020-08-04 DIAGNOSIS — I2510 Atherosclerotic heart disease of native coronary artery without angina pectoris: Secondary | ICD-10-CM | POA: Diagnosis present

## 2020-08-04 DIAGNOSIS — J42 Unspecified chronic bronchitis: Secondary | ICD-10-CM | POA: Diagnosis present

## 2020-08-04 DIAGNOSIS — Z8673 Personal history of transient ischemic attack (TIA), and cerebral infarction without residual deficits: Secondary | ICD-10-CM | POA: Diagnosis not present

## 2020-08-04 DIAGNOSIS — I5041 Acute combined systolic (congestive) and diastolic (congestive) heart failure: Secondary | ICD-10-CM | POA: Diagnosis not present

## 2020-08-04 DIAGNOSIS — E878 Other disorders of electrolyte and fluid balance, not elsewhere classified: Secondary | ICD-10-CM | POA: Diagnosis not present

## 2020-08-04 DIAGNOSIS — Z7982 Long term (current) use of aspirin: Secondary | ICD-10-CM | POA: Diagnosis not present

## 2020-08-04 DIAGNOSIS — Z7901 Long term (current) use of anticoagulants: Secondary | ICD-10-CM | POA: Diagnosis not present

## 2020-08-04 DIAGNOSIS — K5790 Diverticulosis of intestine, part unspecified, without perforation or abscess without bleeding: Secondary | ICD-10-CM | POA: Diagnosis present

## 2020-08-04 DIAGNOSIS — E785 Hyperlipidemia, unspecified: Secondary | ICD-10-CM | POA: Diagnosis present

## 2020-08-04 DIAGNOSIS — J9601 Acute respiratory failure with hypoxia: Secondary | ICD-10-CM | POA: Diagnosis present

## 2020-08-04 DIAGNOSIS — Z79899 Other long term (current) drug therapy: Secondary | ICD-10-CM | POA: Diagnosis not present

## 2020-08-04 DIAGNOSIS — Z20822 Contact with and (suspected) exposure to covid-19: Secondary | ICD-10-CM | POA: Diagnosis present

## 2020-08-04 DIAGNOSIS — R0602 Shortness of breath: Secondary | ICD-10-CM | POA: Diagnosis present

## 2020-08-04 DIAGNOSIS — N179 Acute kidney failure, unspecified: Secondary | ICD-10-CM | POA: Diagnosis not present

## 2020-08-04 LAB — CBC WITH DIFFERENTIAL/PLATELET
Abs Immature Granulocytes: 0.05 10*3/uL (ref 0.00–0.07)
Basophils Absolute: 0.1 10*3/uL (ref 0.0–0.1)
Basophils Relative: 1 %
Eosinophils Absolute: 0 10*3/uL (ref 0.0–0.5)
Eosinophils Relative: 0 %
HCT: 34 % — ABNORMAL LOW (ref 36.0–46.0)
Hemoglobin: 10.9 g/dL — ABNORMAL LOW (ref 12.0–15.0)
Immature Granulocytes: 0 %
Lymphocytes Relative: 12 %
Lymphs Abs: 1.4 10*3/uL (ref 0.7–4.0)
MCH: 28.8 pg (ref 26.0–34.0)
MCHC: 32.1 g/dL (ref 30.0–36.0)
MCV: 89.7 fL (ref 80.0–100.0)
Monocytes Absolute: 1.6 10*3/uL — ABNORMAL HIGH (ref 0.1–1.0)
Monocytes Relative: 14 %
Neutro Abs: 8.1 10*3/uL — ABNORMAL HIGH (ref 1.7–7.7)
Neutrophils Relative %: 73 %
Platelets: 166 10*3/uL (ref 150–400)
RBC: 3.79 MIL/uL — ABNORMAL LOW (ref 3.87–5.11)
RDW: 18.9 % — ABNORMAL HIGH (ref 11.5–15.5)
WBC: 11.2 10*3/uL — ABNORMAL HIGH (ref 4.0–10.5)
nRBC: 0 % (ref 0.0–0.2)

## 2020-08-04 LAB — SARS CORONAVIRUS 2 (TAT 6-24 HRS): SARS Coronavirus 2: NEGATIVE

## 2020-08-04 LAB — BASIC METABOLIC PANEL
Anion gap: 8 (ref 5–15)
BUN: 15 mg/dL (ref 8–23)
CO2: 29 mmol/L (ref 22–32)
Calcium: 8.3 mg/dL — ABNORMAL LOW (ref 8.9–10.3)
Chloride: 96 mmol/L — ABNORMAL LOW (ref 98–111)
Creatinine, Ser: 1.07 mg/dL — ABNORMAL HIGH (ref 0.44–1.00)
GFR, Estimated: 49 mL/min — ABNORMAL LOW (ref >60)
Glucose, Bld: 98 mg/dL (ref 70–99)
Potassium: 3.4 mmol/L — ABNORMAL LOW (ref 3.5–5.1)
Sodium: 133 mmol/L — ABNORMAL LOW (ref 135–145)

## 2020-08-04 LAB — ECHOCARDIOGRAM COMPLETE
AR max vel: 1.81 cm2
AV Peak grad: 5.2 mmHg
Ao pk vel: 1.14 m/s
Area-P 1/2: 4.96 cm2
Height: 63 in
S' Lateral: 2.92 cm
Weight: 2227.2 oz

## 2020-08-04 LAB — MAGNESIUM: Magnesium: 1.8 mg/dL (ref 1.7–2.4)

## 2020-08-04 LAB — PROCALCITONIN: Procalcitonin: <0.1 ng/mL

## 2020-08-04 MED ORDER — GUAIFENESIN-DM 100-10 MG/5ML PO SYRP
5.0000 mL | ORAL_SOLUTION | ORAL | Status: DC | PRN
  Administered 2020-08-04 – 2020-08-06 (×7): 5 mL via ORAL
  Filled 2020-08-04 (×7): qty 5

## 2020-08-04 MED ORDER — FLUTICASONE PROPIONATE 50 MCG/ACT NA SUSP
2.0000 | Freq: Every day | NASAL | Status: DC
  Administered 2020-08-04 – 2020-08-05 (×2): 2 via NASAL
  Filled 2020-08-04: qty 16

## 2020-08-04 MED ORDER — METOPROLOL SUCCINATE ER 50 MG PO TB24
50.0000 mg | ORAL_TABLET | Freq: Every day | ORAL | Status: DC
  Administered 2020-08-04 – 2020-08-05 (×2): 50 mg via ORAL
  Filled 2020-08-04 (×2): qty 1

## 2020-08-04 MED ORDER — FUROSEMIDE 10 MG/ML IJ SOLN: 40.0000 mg | Freq: Every day | INTRAMUSCULAR | Status: DC

## 2020-08-04 MED ORDER — LOSARTAN POTASSIUM 25 MG PO TABS
25.0000 mg | ORAL_TABLET | Freq: Every day | ORAL | Status: DC
  Administered 2020-08-05 – 2020-08-06 (×2): 25 mg via ORAL
  Filled 2020-08-04 (×2): qty 1

## 2020-08-04 MED ORDER — FUROSEMIDE 10 MG/ML IJ SOLN
20.0000 mg | Freq: Every day | INTRAMUSCULAR | Status: DC
  Administered 2020-08-04: 20 mg via INTRAVENOUS
  Filled 2020-08-04: qty 4

## 2020-08-04 MED ORDER — POTASSIUM CHLORIDE CRYS ER 20 MEQ PO TBCR
40.0000 meq | EXTENDED_RELEASE_TABLET | Freq: Once | ORAL | Status: AC
  Administered 2020-08-04: 40 meq via ORAL
  Filled 2020-08-04: qty 2

## 2020-08-04 MED ORDER — PREDNISONE 20 MG PO TABS
40.0000 mg | ORAL_TABLET | Freq: Every day | ORAL | Status: DC
  Administered 2020-08-05 – 2020-08-06 (×2): 40 mg via ORAL
  Filled 2020-08-04 (×2): qty 2

## 2020-08-04 MED ORDER — ORAL CARE MOUTH RINSE
15.0000 mL | Freq: Two times a day (BID) | OROMUCOSAL | Status: DC
  Administered 2020-08-04 – 2020-08-05 (×4): 15 mL via OROMUCOSAL
  Filled 2020-08-04 (×2): qty 15

## 2020-08-04 MED ORDER — METHYLPREDNISOLONE SODIUM SUCC 40 MG IJ SOLR
40.0000 mg | Freq: Two times a day (BID) | INTRAMUSCULAR | Status: DC
  Administered 2020-08-04: 40 mg via INTRAVENOUS
  Filled 2020-08-04: qty 1

## 2020-08-04 MED ORDER — DOXYCYCLINE HYCLATE 100 MG PO TABS
100.0000 mg | ORAL_TABLET | Freq: Two times a day (BID) | ORAL | Status: DC
  Administered 2020-08-04 – 2020-08-06 (×5): 100 mg via ORAL
  Filled 2020-08-04 (×5): qty 1

## 2020-08-04 NOTE — ED Notes (Signed)
IP RN Page advising ready for patient when room clean.

## 2020-08-04 NOTE — Evaluation (Signed)
Occupational Therapy Evaluation Patient Details Name: Allison Novak MRN: [id number] DOB: 11-Jun-1925 Today's Date: 08/04/2020    History of Present Illness 87 y.o. female with medical history significant for heart failure reduced ejection fraction, hypertension, hyperlipidemia, history of atrial fibrillation on Eliquis, history of TIA, coronary artery disease and aortic atherosclerosis, carotid artery disease, presents to the emergency department for chief concerns of worsening shortness of breath.   Clinical Impression   Patient presenting with decreased I in self care, balance, functional mobility/transfers, endurance, and safety awareness. Patient reports living at home alone and being independent in ADLs and IADL tasks. Pt does not use AE. When entering the room, pt on 4 L O2 via [place] and able to be turned down to 2L and desats to 90% with activity but quickly recovers. Pt's son is in and out during the day checking on pt and having lunch with her each day. Patient currently functioning with min guard - min A for self care and functional mobility tasks with hand held assist. OT did educate pt on energy conservation techniques for home. OT recommended BSC for safety with frequency toileting at nighttime. OT also discussed shower chair but pt was hesitant.  Patient will benefit from acute OT to increase overall independence in the areas of ADLs, functional mobility, and safety awareness in order to safely discharge home.    Follow Up Recommendations  Home health OT , intermittent supervision   Equipment Recommendations  3 in 1 bedside commode       Precautions / Restrictions Precautions Precautions: Fall      Mobility Bed Mobility Overal bed mobility: Needs Assistance Bed Mobility: Supine to Sit;Sit to Supine     Supine to sit: Min guard;HOB elevated Sit to supine: Min assist;HOB elevated   General bed mobility comments: min cuing for technique. Assist with LE secondary to being on  high stretcher    Transfers Overall transfer level: Needs assistance Equipment used: 1 person hand held assist Transfers: Sit to/from Stand;Stand Pivot Transfers Sit to Stand: Min guard Stand pivot transfers: Min guard            Balance Overall balance assessment: Needs assistance Sitting-balance support: Feet supported Sitting balance-Leahy Scale: Good     Standing balance support: During functional activity Standing balance-Leahy Scale: Fair                             ADL either performed or assessed with clinical judgement   ADL Overall ADL's : Needs assistance/impaired     Grooming: Wash/dry hands;Wash/dry face;Sitting;Set up               Lower Body Dressing: Min guard Lower Body Dressing Details (indicate cue type and reason): donning B shoes Toilet Transfer: Min Psychiatric nurse Details (indicate cue type and reason): simulated transfer         Functional mobility during ADLs: Min guard       Vision Baseline Vision/History: Wears glasses Wears Glasses: At all times Patient Visual Report: No change from baseline              Pertinent Vitals/Pain Pain Assessment: No/denies pain     Hand Dominance Right   Extremity/Trunk Assessment Upper Extremity Assessment Upper Extremity Assessment: Overall WFL for tasks assessed   Lower Extremity Assessment Lower Extremity Assessment: Defer to PT evaluation       Communication Communication Communication: No difficulties   Cognition Arousal/Alertness: Awake/alert Behavior During  Therapy: WFL for tasks assessed/performed Overall Cognitive Status: Within Functional Limits for tasks assessed                                                Home Living Family/patient expects to be discharged to:: Private residence Living Arrangements: Alone Available Help at Discharge: Family;Available PRN/intermittently Type of Home: House Home Access: Ramped entrance      Home Layout: Able to live on main level with bedroom/bathroom     Bathroom Shower/Tub: Tub/shower unit   Bathroom Toilet: Handicapped height     Home Equipment: Environmental consultant - 2 wheels;Cane - single point;Grab bars - tub/shower;Wheelchair - manual          Prior Functioning/Environment Level of Independence: Independent with assistive device(s)        Comments: Pt reports independent with ADLs and IADLs. Her son comes by several times each day and has meals with her as well.        OT Problem List: Decreased strength;Decreased activity tolerance;Decreased knowledge of use of DME or AE;Impaired balance (sitting and/or standing);Decreased safety awareness      OT Treatment/Interventions: Self-care/ADL training;DME and/or AE instruction;Therapeutic activities;Therapeutic exercise;Energy conservation;Patient/family education;Manual therapy;Balance training    OT Goals(Current goals can be found in the care plan section) Acute Rehab OT Goals Patient Stated Goal: to go home OT Goal Formulation: With patient/family Time For Goal Achievement: 08/18/20 Potential to Achieve Goals: Good  OT Frequency: Min 2X/week   Barriers to D/C:    none known at this time          AM-PAC OT "6 Clicks" Daily Activity     Outcome Measure Help from another person eating meals?: None Help from another person taking care of personal grooming?: None Help from another person toileting, which includes using toliet, bedpan, or urinal?: A Little Help from another person bathing (including washing, rinsing, drying)?: A Little Help from another person to put on and taking off regular upper body clothing?: None Help from another person to put on and taking off regular lower body clothing?: A Little 6 Click Score: 21   End of Session Nurse Communication: Mobility status  Activity Tolerance: Patient tolerated treatment well Patient left: in bed;with call bell/phone within reach;with family/visitor  present  OT Visit Diagnosis: Unsteadiness on feet (R26.81);Muscle weakness (generalized) (M62.81)                Time: 2505-3976 OT Time Calculation (min): 38 min Charges:  OT General Charges $OT Visit: 1 Visit OT Evaluation $OT Eval Low Complexity: 1 Low OT Treatments $Self Care/Home Management : 8-22 mins $Therapeutic Activity: 8-22 mins  Darleen Crocker, MS, OTR/L , CBIS ascom [phone]  08/04/20, 1:02 PM

## 2020-08-04 NOTE — ED Notes (Signed)
Secure chat sent to IP RN, Page.

## 2020-08-04 NOTE — Progress Notes (Signed)
PROGRESS NOTE  Allison Novak GDJ:[id number] DOB: [birth year] DOA: 08/03/2020 PCP: Adin Hector, MD  HPI/Recap of past 24 hours: Allison Novak is a 87 y.o. female with medical history significant for heart failure reduced ejection fraction, hypertension, hyperlipidemia, history of atrial fibrillation on Eliquis, history of TIA, coronary artery disease and aortic atherosclerosis, carotid artery disease, presents to the emergency department for chief concerns of worsening shortness of breath.  She reports that shortness of breath has been ongoing for the last several months however the last 2 to 3 days it has been worsening.  She states she is compliant with less than 2 L of p.o. liquid intake.  She denies sick contacts.  She also endorses that she thinks she has gained 3 to 4 pounds in the last week.  She also endorses a cough that is not productive.  She states that shortness of breath is worse with exertion and worse when she lays flat.  She denies fever, chills, chest pain, dysuria, hematuria, nausea, vomiting.  Social history: She lives by herself and is able to perform most of her ADLs.  She denies tobacco, EtOH, recreational drug use.  Vaccination: She is vaccinated for COVID-19, 2 doses  At bedside she was able to tell me her name, her age, identified her daughter at bedside, and that she is in the hospital.  She states that the treatment given by the ED provider has improved her symptoms.  She states she did not take her a.m. medications.  08/04/20: Seen and examined at bedside.  She reports a semiproductive cough of 2 months duration.  Suspect chronic bronchitis.  Started on p.o. doxycycline 100 mg twice daily x5 days.   Assessment/Plan: Principal Problem:   Acute CHF (congestive heart failure) (HCC) Active Problems:   GERD (gastroesophageal reflux disease)   History of TIA (transient ischemic attack)   Essential hypertension   Hyperlipidemia   Chronic  anticoagulation   Carotid artery disease (HCC)   Shortness of breath   Atrial fibrillation, chronic (HCC)   Acute exacerbation of CHF (congestive heart failure) (HCC)   Acute on chronic combined diastolic and systolic CHF exacerbation. Unclear etiology Worsening dyspnea on presentation Elevated BNP and evidence of pulmonary edema on chest x-ray. Started on diuresing, continue Start strict I's and O's and daily weight. Monitor electrolytes and vital signs closely 2D echo done on 08/03/2020 showed LVEF 45 to 50%, left ventricle had no regional wall motion abnormalities, grade 2 diastolic dysfunction.  Chronic bronchitis Report of semiproductive cough in the last month Start p.o. doxycycline 100 mg twice daily x5 days and prednisone 40 mg twice daily x5 days. Bronchodilators, pulmonary toilet.  Acute hypoxic respiratory failure secondary to above O2 saturation in the ED 89% on room air Currently on 2 L with O2 saturation in the low 90s Maintaining saturation greater than 92% Treat underlying conditions Home O2 evaluation prior to DC.  Permanent atrial fibrillation Rate controlled on Toprol-XL On Eliquis for CVA prevention.  Coronary artery disease Denies any anginal symptoms. Troponin, first set -7 Continue aspirin Lipitor  GERD/hyperlipidemia/gout Resume home regimen  Physical debility PT OT to assess Fall precautions.   Code Status: Partial code.  Yes to CPR and ACLS medication and shock, no to intubation.  Family Communication: Updated her daughter at bedside.  All questions answered to the best of my ability.  Disposition Plan: Likely will discharge to home once hemodynamically stable.   Consultants:  None.  Procedures:  2D echo done  on 08/03/2020.  Antimicrobials:  Doxycycline.  DVT prophylaxis:  Eliquis  Status is: Inpatient   Dispo:  Patient From: Home  Planned Disposition: Home with Health Care Svc  Medically stable for discharge: No,  ongoing management of acute on chronic combined diastolic and systolic CHF.          Objective: Vitals:   08/04/20 1600 08/04/20 1630 08/04/20 1716 08/04/20 1721  BP:  102/72 113/81   Pulse: (!) 124 (!) 112 63 63  Resp: 19 (!) 22 16 20   Temp:   98.3 F (36.8 C)   TempSrc:   Oral   SpO2: 91% 93% 90% 96%  Weight:      Height:        Intake/Output Summary (Last 24 hours) at 08/04/2020 1756 Last data filed at 08/04/2020 1646 Gross per 24 hour  Intake --  Output 700 ml  Net -700 ml   Filed Weights   08/03/20 1033  Weight: 63.1 kg    Exam:  . General: 87 y.o. year-old female well developed well nourished in no acute distress.  Alert and oriented x3. . Cardiovascular: Irregular rate and rhythm with no rubs or gallops.  No thyromegaly or JVD noted.   Marland Kitchen Respiratory: Diffuse wheezing noted bilaterally.  Mild rales at bases.  Poor inspiratory effort.   . Abdomen: Soft nontender nondistended with normal bowel sounds x4 quadrants. . Musculoskeletal: Trace lower extremity edema. 2/4 pulses in all 4 extremities. . Skin: No ulcerative lesions noted or rashes, . Psychiatry: Mood is appropriate for condition and setting   Data Reviewed: CBC: Recent Labs  Lab 08/03/20 1032 08/04/20 0419  WBC 14.5* 11.2*  NEUTROABS  --  8.1*  HGB 13.2 10.9*  HCT 40.8 34.0*  MCV 89.5 89.7  PLT 209 433   Basic Metabolic Panel: Recent Labs  Lab 08/03/20 1032 08/04/20 0419  NA 135 133*  K 3.8 3.4*  CL 99 96*  CO2 24 29  GLUCOSE 119* 98  BUN 15 15  CREATININE 0.94 1.07*  CALCIUM 9.3 8.3*  MG  --  1.8   GFR: Estimated Creatinine Clearance: 30 mL/min (A) (by C-G formula based on SCr of 1.07 mg/dL (H)). Liver Function Tests: No results for input(s): AST, ALT, ALKPHOS, BILITOT, PROT, ALBUMIN in the last 168 hours. No results for input(s): LIPASE, AMYLASE in the last 168 hours. No results for input(s): AMMONIA in the last 168 hours. Coagulation Profile: No results for input(s): INR,  PROTIME in the last 168 hours. Cardiac Enzymes: No results for input(s): CKTOTAL, CKMB, CKMBINDEX, TROPONINI in the last 168 hours. BNP (last 3 results) No results for input(s): PROBNP in the last 8760 hours. HbA1C: No results for input(s): HGBA1C in the last 72 hours. CBG: No results for input(s): GLUCAP in the last 168 hours. Lipid Profile: No results for input(s): CHOL, HDL, LDLCALC, TRIG, CHOLHDL, LDLDIRECT in the last 72 hours. Thyroid Function Tests: Recent Labs    08/03/20 1032  TSH 2.805   Anemia Panel: No results for input(s): VITAMINB12, FOLATE, FERRITIN, TIBC, IRON, RETICCTPCT in the last 72 hours. Urine analysis:    Component Value Date/Time   COLORURINE COLORLESS (A) 02/18/2017 1318   APPEARANCEUR CLEAR (A) 02/18/2017 1318   LABSPEC 1.002 (L) 02/18/2017 1318   PHURINE 7.0 02/18/2017 1318   GLUCOSEU NEGATIVE 02/18/2017 1318   HGBUR NEGATIVE 02/18/2017 [place] 02/18/2017 [place] 02/18/2017 1318   PROTEINUR NEGATIVE 02/18/2017 1318   NITRITE NEGATIVE 02/18/2017 1318  LEUKOCYTESUR NEGATIVE 02/18/2017 1318   Sepsis Labs: @LABRCNTIP (procalcitonin:4,lacticidven:4)  ) Recent Results (from the past 240 hour(s))  SARS CORONAVIRUS 2 (TAT 6-24 HRS) Nasopharyngeal Nasopharyngeal Swab     Status: None   Collection Time: 08/03/20  2:15 PM   Specimen: Nasopharyngeal Swab  Result Value Ref Range Status   SARS Coronavirus 2 NEGATIVE NEGATIVE Final    Comment: (NOTE) SARS-CoV-2 target nucleic acids are NOT DETECTED.  The SARS-CoV-2 RNA is generally detectable in upper and lower respiratory specimens during the acute phase of infection. Negative results do not preclude SARS-CoV-2 infection, do not rule out co-infections with other pathogens, and should not be used as the sole basis for treatment or other patient management decisions. Negative results must be combined with clinical observations, patient history, and epidemiological  information. The expected result is Negative.  Fact Sheet for Patients: SugarRoll.be  Fact Sheet for Healthcare Providers: https://www.woods-mathews.com/  This test is not yet approved or cleared by the [place] FDA and  has been authorized for detection and/or diagnosis of SARS-CoV-2 by FDA under an Emergency Use Authorization (EUA). This EUA will remain  in effect (meaning this test can be used) for the duration of the COVID-19 declaration under Se ction 564(b)(1) of the Act, 21 U.S.C. section 360bbb-3(b)(1), unless the authorization is terminated or revoked sooner.  Performed at [facility], [place] [address], [place], [place] [address]       Studies: ECHOCARDIOGRAM COMPLETE  Result Date: 08/04/2020    ECHOCARDIOGRAM REPORT   Patient Name:   Allison Novak Date of Exam: 08/03/2020 Medical Rec #:  [id number]       Height:       63.0 in Accession #:    [id number]      Weight:       139.2 lb Date of Birth:  [birth year]       BSA:          1.658 m Patient Age:    83 years        BP:           118/67 mmHg Patient Gender: F               HR:           97 bpm. Exam Location:  [facility] Procedure: 2D Echo, Cardiac Doppler and Color Doppler Indications:     Dyspnea R06.00  History:         Patient has prior history of Echocardiogram examinations. CHF;                  Risk Factors:Hypertension.  Sonographer:     Alyse Low Roar Referring Phys:  [id number] AMY N COX Diagnosing Phys: Serafina Royals MD IMPRESSIONS  1. Left ventricular ejection fraction, by estimation, is 45 to 50%. The left ventricle has mildly decreased function. The left ventricle has no regional wall motion abnormalities. Left ventricular diastolic parameters are consistent with Grade II diastolic dysfunction (pseudonormalization).  2. Right ventricular systolic function is normal. The right ventricular size is normal.  3. Left atrial size was moderately dilated.  4. Right atrial size was  moderately dilated.  5. The mitral valve is normal in structure. Moderate mitral valve regurgitation.  6. Tricuspid valve regurgitation is moderate.  7. The aortic valve is normal in structure. Aortic valve regurgitation is trivial. FINDINGS  Left Ventricle: Left ventricular ejection fraction, by estimation, is 45 to 50%. The left ventricle has mildly decreased function. The left ventricle has no  regional wall motion abnormalities. The left ventricular internal cavity size was normal in size. There is no left ventricular hypertrophy. Left ventricular diastolic parameters are consistent with Grade II diastolic dysfunction (pseudonormalization). Right Ventricle: The right ventricular size is normal. No increase in right ventricular wall thickness. Right ventricular systolic function is normal. Left Atrium: Left atrial size was moderately dilated. Right Atrium: Right atrial size was moderately dilated. Pericardium: There is no evidence of pericardial effusion. Mitral Valve: The mitral valve is normal in structure. Moderate mitral valve regurgitation. Tricuspid Valve: The tricuspid valve is normal in structure. Tricuspid valve regurgitation is moderate. Aortic Valve: The aortic valve is normal in structure. Aortic valve regurgitation is trivial. Aortic valve peak gradient measures 5.2 mmHg. Pulmonic Valve: The pulmonic valve was normal in structure. Pulmonic valve regurgitation is not visualized. Aorta: The aortic root and ascending aorta are structurally normal, with no evidence of dilitation. IAS/Shunts: No atrial level shunt detected by color flow Doppler.  LEFT VENTRICLE PLAX 2D LVIDd:         3.94 cm  Diastology LVIDs:         2.92 cm  LV e' medial:    6.64 cm/s LV PW:         0.89 cm  LV E/e' medial:  17.0 LV IVS:        0.93 cm  LV e' lateral:   8.16 cm/s LVOT diam:     1.70 cm  LV E/e' lateral: 13.8 LVOT Area:     2.27 cm  RIGHT VENTRICLE RV Mid diam:    2.93 cm RV S prime:     12.20 cm/s TAPSE (M-mode): 1.3 cm  LEFT ATRIUM             Index       RIGHT ATRIUM           Index LA diam:        3.90 cm 2.35 cm/m  RA Area:     22.10 cm LA Vol (A2C):   70.4 ml 42.47 ml/m RA Volume:   66.50 ml  40.12 ml/m LA Vol (A4C):   63.3 ml 38.19 ml/m LA Biplane Vol: 70.2 ml 42.35 ml/m  AORTIC VALVE                PULMONIC VALVE AV Area (Vmax): 1.81 cm    PV Vmax:        0.90 m/s AV Vmax:        114.00 cm/s PV Peak grad:   3.2 mmHg AV Peak Grad:   5.2 mmHg    RVOT Peak grad: 1 mmHg LVOT Vmax:      90.80 cm/s  AORTA Ao Root diam: 2.60 cm MITRAL VALVE                TRICUSPID VALVE MV Area (PHT): 4.96 cm     TR Peak grad:   43.8 mmHg MV Decel Time: 153 msec     TR Vmax:        331.00 cm/s MV E velocity: 113.00 cm/s                             SHUNTS                             Systemic Diam: 1.70 cm Serafina Royals MD Electronically signed by Serafina Royals MD Signature Date/Time: 08/04/2020/8:23:13 AM  Final     Scheduled Meds: . allopurinol  100 mg Oral Daily  . apixaban  2.5 mg Oral BID  . aspirin EC  81 mg Oral Daily  . atorvastatin  40 mg Oral Daily  . doxycycline  100 mg Oral Q12H  . fluticasone  2 spray Each Nare Daily  . furosemide  20 mg Intravenous Daily  . [START ON 08/05/2020] losartan  25 mg Oral Daily  . mouth rinse  15 mL Mouth Rinse BID  . methylPREDNISolone (SOLU-MEDROL) injection  40 mg Intravenous BID  . metoprolol  50 mg Oral Daily  . oxybutynin  5 mg Oral QHS  . pantoprazole  40 mg Oral Daily  . sodium chloride flush  3 mL Intravenous Q12H    Continuous Infusions: . sodium chloride       LOS: 0 days     Kayleen Memos, MD [facility] Pager [phone]  If 7PM-7AM, please contact night-coverage [email or web address] Password [facility] 08/04/2020, 5:56 PM

## 2020-08-04 NOTE — ED Notes (Signed)
Pt noted to have nonproductive cough since taken over care. Pt denies any pain but would like something to help her cough. Provider notified.

## 2020-08-04 NOTE — Evaluation (Signed)
Physical Therapy Evaluation Patient Details Name: Allison Novak MRN: [id number] DOB: [birth year] Today's Date: 08/04/2020   History of Present Illness  87 y.o. female with medical history significant for heart failure reduced ejection fraction, hypertension, hyperlipidemia, history of atrial fibrillation on Eliquis, history of TIA, coronary artery disease and aortic atherosclerosis, carotid artery disease, presents to the emergency department for chief concerns of worsening shortness of breath.    Clinical Impression  Patient received in bed, she is coughing throughout session. Patient is agreeable to PT assessment. She performed bed mobility with min guard, transfers with min guard and ambulated 15 feet in room with min guard. Patient is limited by lines and leads for walking and coughing. Patient will continue to benefit from skilled PT while here to improve functional mobility and strength.      Follow Up Recommendations Home health PT    Equipment Recommendations  None recommended by PT    Recommendations for Other Services       Precautions / Restrictions Precautions Precautions: Fall Restrictions Weight Bearing Restrictions: No      Mobility  Bed Mobility Overal bed mobility: Needs Assistance Bed Mobility: Supine to Sit;Sit to Supine     Supine to sit: HOB elevated;Min guard Sit to supine: Min assist;HOB elevated   General bed mobility comments: min cuing for technique. Assist with LE secondary to being on high stretcher    Transfers Overall transfer level: Needs assistance Equipment used: 1 person hand held assist Transfers: Sit to/from Stand Sit to Stand: Min guard Stand pivot transfers: Min guard          Ambulation/Gait Ambulation/Gait assistance: Min guard Gait Distance (Feet): 15 Feet Assistive device: 1 person hand held assist Gait Pattern/deviations: Step-through pattern;Decreased step length - right;Decreased step length - left Gait velocity:  decr   General Gait Details: Patient coughing throughout session. O2 sats at 88% on [place]. patient generally steady with mobility. Limited in distance by lines.  Stairs            Wheelchair Mobility    Modified Rankin (Stroke Patients Only)       Balance Overall balance assessment: Needs assistance Sitting-balance support: Feet supported Sitting balance-Leahy Scale: Good     Standing balance support: Single extremity supported;During functional activity Standing balance-Leahy Scale: Good Standing balance comment: generally steady with gait. Min guard                             Pertinent Vitals/Pain Pain Assessment: No/denies pain    Home Living Family/patient expects to be discharged to:: Private residence Living Arrangements: Alone Available Help at Discharge: Family;Available PRN/intermittently Type of Home: House Home Access: Ramped entrance     Home Layout: Able to live on main level with bedroom/bathroom Home Equipment: Walker - 2 wheels;Grab bars - tub/shower;Wheelchair - manual;Cane - single point      Prior Function Level of Independence: Independent with assistive device(s)         Comments: Pt reports independent with ADLs and IADLs. Her son comes by several times each day and has meals with her as well.     Hand Dominance   Dominant Hand: Right    Extremity/Trunk Assessment   Upper Extremity Assessment Upper Extremity Assessment: Overall WFL for tasks assessed    Lower Extremity Assessment Lower Extremity Assessment: Overall WFL for tasks assessed    Cervical / Trunk Assessment Cervical / Trunk Assessment: Normal  Communication   Communication:  No difficulties  Cognition Arousal/Alertness: Awake/alert Behavior During Therapy: WFL for tasks assessed/performed Overall Cognitive Status: Within Functional Limits for tasks assessed                                        General Comments      Exercises      Assessment/Plan    PT Assessment Patient needs continued PT services  PT Problem List Decreased strength;Decreased mobility;Decreased activity tolerance;Decreased balance       PT Treatment Interventions Gait training;Functional mobility training;Therapeutic activities;Patient/family education;Therapeutic exercise    PT Goals (Current goals can be found in the Care Plan section)  Acute Rehab PT Goals Patient Stated Goal: to go home PT Goal Formulation: With patient Time For Goal Achievement: 08/18/20 Potential to Achieve Goals: Good    Frequency Min 2X/week   Barriers to discharge Decreased caregiver support      Co-evaluation               AM-PAC PT "6 Clicks" Mobility  Outcome Measure Help needed turning from your back to your side while in a flat bed without using bedrails?: A Little Help needed moving from lying on your back to sitting on the side of a flat bed without using bedrails?: A Little Help needed moving to and from a bed to a chair (including a wheelchair)?: A Little Help needed standing up from a chair using your arms (e.g., wheelchair or bedside chair)?: A Little Help needed to walk in hospital room?: A Little Help needed climbing 3-5 steps with a railing? : A Little 6 Click Score: 18    End of Session Equipment Utilized During Treatment: Gait belt;Oxygen Activity Tolerance: Patient tolerated treatment well Patient left: in bed;with call bell/phone within reach Nurse Communication: Mobility status PT Visit Diagnosis: Muscle weakness (generalized) (M62.81);Difficulty in walking, not elsewhere classified (R26.2)    Time: 8768-1157 PT Time Calculation (min) (ACUTE ONLY): 15 min   Charges:   PT Evaluation $PT Eval Moderate Complexity: 1 Mod          Michell Giuliano, PT, GCS 08/04/20,12:34 PM

## 2020-08-04 NOTE — Evaluation (Signed)
Clinical/Bedside Swallow Evaluation Patient Details  Name: Allison Novak MRN: [id number] Date of Birth: 1925-10-06  Today's Date: 08/04/2020 Time: SLP Start Time (ACUTE ONLY): 66 SLP Stop Time (ACUTE ONLY): 1015 SLP Time Calculation (min) (ACUTE ONLY): 60 min  Past Medical History:  Past Medical History:  Diagnosis Date  . A-fib ([place])   . CHF (congestive heart failure) ([place])   . Coronary artery disease   . Diverticulosis   . GERD (gastroesophageal reflux disease)   . Gout   . Hypertension   . Osteoporosis    Past Surgical History:  Past Surgical History:  Procedure Laterality Date  . ABDOMINAL HYSTERECTOMY    . ANKLE SURGERY Left 2011  . KNEE ARTHROSCOPY Right    HPI:  Pt is a 87 y.o. female with medical history significant for heart failure reduced ejection fraction, hypertension, hyperlipidemia, history of atrial fibrillation on Eliquis, GERD, history of TIA, coronary artery disease and aortic atherosclerosis, carotid artery disease, presents to the emergency department for chief concerns of worsening shortness of breath.  She reports that shortness of breath has been ongoing for the last several months however the last 2 to 3 days it has been worsening.  She states she is compliant with less than 2 L of p.o. liquid intake.  She also endorses that she thinks she has gained 3 to 4 pounds in the last week.  She also endorses a cough that is not productive.  She states that shortness of breath is worse with exertion and worse when she lays flat.  CXR: Increased diffuse interstitial infiltrates, suspicious for  interstitial edema. Increased bibasilar atelectasis and tiny bilateral pleural  effusions.  Pt has a dx'd  Large Hiatal Hernia per CT and UGI in 2019.   Assessment / Plan / Recommendation Clinical Impression  Pt appears to present w/ adequate oropharyngeal phase swallowing function w/ No overt oropharyngeal phase dysphagia appreciated w/ po trials at meal; No neuromuscular  swallowing deficits appreciated. Pt appears at reduced risk for aspiration from an oropharyngeal phase standpoint following general aspiration precautions.  However, pt has a Baseline of a Large Hiatal Hernia and GERD per CT and UGI in 2019. ANY Dysmotility or Regurgitation of Reflux material can increase risk for aspiration of the Reflux material during Retrograde flow thus impact Pulmonary status. Pt endorsed Full feelings at times when eating meals at home; no immediate deficits of regurgitation endorsed. Pt also has current issues of CHF and SOB w/ cough. This constant Hacking cough was noted upon entering room PRIOR to any po's given. Pt stated she often had this cough at home in past few days especially when lying down.  With trials of po's during this assessment, pt consumed thin liquids, purees, and soft solids w/ no immediate, overt clinical s/s of aspiration noted; clear vocal quality b/t trials, no decline in pulmonary status, no multiple swallows noted post initial pharyngeal swallow. O2 sats remained in upper 90s. Oral phase appeared [facility] for bolus management, mastication, and timely A-P transfer/clearing of food and liquid material. OM exam was [facility] for oral clearing; lingual/labial movements. No unilateral weakness. Speech clear. No report of Esophageal phase dysmotility or discomfort noted during/post trials consumed.   Recommend continue Regluar diet w/ moistened foods and all foods cut small; thin liquids via Cup/less straw use d/t air swallowing; general aspiration precautions. Rest Breaks during meals/oral intake to allow for Esophageal clearing. Strict GERD/REFLUX precautions strongly recommended to lessen chance for Regurgitation. Recommend pt continue f/u w/ GI for  management of GERD. Disucssion and handouts given on Reflux, Esophageal dysmotility re: compensatory strategies, and soft foods diet for ease of intake. MD to reconsult ST services if any new needs while admitted. Daughter and pt  agreed; NSG updated. Dietician consult for nutritional support could be helpful. SLP Visit Diagnosis: Dysphagia, unspecified (R13.10) (Esophageal phase dysmotility, GERD; Large Hiatal Hernia)    Aspiration Risk   (reduced following precautions)    Diet Recommendation  Regular diet w/ cut, moistened foods(less meats and breads); Thin liquids. General aspiration precautions. STRICT GERD/Reflux precautions d/t Large Hiatal Hernia and GERD baseline.  Medication Administration: Whole meds with liquid (but use of Applesauce for swallowing Pills if easier for pt)    Other  Recommendations Recommended Consults: Consider esophageal assessment;Consider GI evaluation (for further management and education) Oral Care Recommendations: Oral care BID;Oral care before and after PO;Patient independent with oral care Other Recommendations:  (n/a)   Follow up Recommendations None      Frequency and Duration  (n/a)   (n/a)       Prognosis Prognosis for Safe Diet Advancement: Fair (-Good) Barriers to Reach Goals: Time post onset;Severity of deficits (Large HIatal Hernia)      Swallow Study   General Date of Onset: 08/03/20 HPI: Pt is a 87 y.o. female with medical history significant for heart failure reduced ejection fraction, hypertension, hyperlipidemia, history of atrial fibrillation on Eliquis, GERD, history of TIA, coronary artery disease and aortic atherosclerosis, carotid artery disease, presents to the emergency department for chief concerns of worsening shortness of breath.  She reports that shortness of breath has been ongoing for the last several months however the last 2 to 3 days it has been worsening.  She states she is compliant with less than 2 L of p.o. liquid intake.  She also endorses that she thinks she has gained 3 to 4 pounds in the last week.  She also endorses a cough that is not productive.  She states that shortness of breath is worse with exertion and worse when she lays flat.  CXR:  Increased diffuse interstitial infiltrates, suspicious for  interstitial edema. Increased bibasilar atelectasis and tiny bilateral pleural  effusions.  Pt has a dx'd  Large Hiatal Hernia per CT and UGI in 2019. Type of Study: Bedside Swallow Evaluation Previous Swallow Assessment: none Diet Prior to this Study: Regular;Thin liquids Temperature Spikes Noted: No (wbc 11.2) Respiratory Status: Nasal cannula (2-4L) History of Recent Intubation: No Behavior/Cognition: Alert;Cooperative;Pleasant mood;Requires cueing (min) Oral Cavity Assessment: Within Functional Limits Oral Care Completed by SLP: Yes Oral Cavity - Dentition: Adequate natural dentition Vision: Functional for self-feeding Self-Feeding Abilities: Able to feed self;Needs set up Patient Positioning: Upright in bed (needed support to achieve a more upright positioning) Baseline Vocal Quality: Normal Volitional Cough: Strong Volitional Swallow: Able to elicit    Oral/Motor/Sensory Function Overall Oral Motor/Sensory Function: Within functional limits   Ice Chips Ice chips: Within functional limits Presentation: Spoon (2 trials)   Thin Liquid Thin Liquid: Within functional limits Presentation: Cup;Self Fed (10+ ozs)    Nectar Thick Nectar Thick Liquid: Not tested   Honey Thick Honey Thick Liquid: Not tested   Puree Puree: Within functional limits Presentation: Self Fed;Spoon (3-4 ozs)   Solid     Solid: Within functional limits Presentation: Self Fed;Spoon (6+ trials) Other Comments: and muffin        Orinda Kenner, MS, [employer] Speech Language Pathologist Rehab Services [phone] Kyasia Steuck 08/04/2020,4:52 PM

## 2020-08-04 NOTE — Plan of Care (Signed)
  Problem: Clinical Measurements: Goal: Will remain free from infection Outcome: Progressing Goal: Respiratory complications will improve Outcome: Progressing Goal: Cardiovascular complication will be avoided Outcome: Progressing   

## 2020-08-05 DIAGNOSIS — I5041 Acute combined systolic (congestive) and diastolic (congestive) heart failure: Secondary | ICD-10-CM

## 2020-08-05 DIAGNOSIS — I10 Essential (primary) hypertension: Secondary | ICD-10-CM

## 2020-08-05 DIAGNOSIS — I5043 Acute on chronic combined systolic (congestive) and diastolic (congestive) heart failure: Secondary | ICD-10-CM

## 2020-08-05 DIAGNOSIS — Z8673 Personal history of transient ischemic attack (TIA), and cerebral infarction without residual deficits: Secondary | ICD-10-CM

## 2020-08-05 DIAGNOSIS — I482 Chronic atrial fibrillation, unspecified: Secondary | ICD-10-CM

## 2020-08-05 DIAGNOSIS — Z7901 Long term (current) use of anticoagulants: Secondary | ICD-10-CM

## 2020-08-05 LAB — CBC
HCT: 36.7 % (ref 36.0–46.0)
Hemoglobin: 12 g/dL (ref 12.0–15.0)
MCH: 29.1 pg (ref 26.0–34.0)
MCHC: 32.7 g/dL (ref 30.0–36.0)
MCV: 88.9 fL (ref 80.0–100.0)
Platelets: 180 10*3/uL (ref 150–400)
RBC: 4.13 MIL/uL (ref 3.87–5.11)
RDW: 18.8 % — ABNORMAL HIGH (ref 11.5–15.5)
WBC: 10.2 10*3/uL (ref 4.0–10.5)
nRBC: 0 % (ref 0.0–0.2)

## 2020-08-05 LAB — BASIC METABOLIC PANEL
Anion gap: 10 (ref 5–15)
BUN: 26 mg/dL — ABNORMAL HIGH (ref 8–23)
CO2: 26 mmol/L (ref 22–32)
Calcium: 8.5 mg/dL — ABNORMAL LOW (ref 8.9–10.3)
Chloride: 96 mmol/L — ABNORMAL LOW (ref 98–111)
Creatinine, Ser: 1.35 mg/dL — ABNORMAL HIGH (ref 0.44–1.00)
GFR, Estimated: 37 mL/min — ABNORMAL LOW (ref >60)
Glucose, Bld: 121 mg/dL — ABNORMAL HIGH (ref 70–99)
Potassium: 4.4 mmol/L (ref 3.5–5.1)
Sodium: 132 mmol/L — ABNORMAL LOW (ref 135–145)

## 2020-08-05 LAB — MAGNESIUM: Magnesium: 1.7 mg/dL (ref 1.7–2.4)

## 2020-08-05 LAB — PHOSPHORUS: Phosphorus: 3.8 mg/dL (ref 2.5–4.6)

## 2020-08-05 LAB — PROCALCITONIN: Procalcitonin: 0.1 ng/mL

## 2020-08-05 MED ORDER — BENZONATATE 100 MG PO CAPS
200.0000 mg | ORAL_CAPSULE | Freq: Three times a day (TID) | ORAL | Status: DC | PRN
  Administered 2020-08-05 – 2020-08-06 (×2): 200 mg via ORAL
  Filled 2020-08-05 (×2): qty 2

## 2020-08-05 NOTE — Plan of Care (Signed)
  Problem: Clinical Measurements: Goal: Diagnostic test results will improve Outcome: Progressing   Problem: Education: Goal: Knowledge of General Education information will improve Description: Including pain rating scale, medication(s)/side effects and non-pharmacologic comfort measures Outcome: Progressing

## 2020-08-05 NOTE — Consult Note (Signed)
[facility] Cardiology Consultation Note  Patient ID: Allison Novak, MRN: [id number], DOB/AGE: October 12, 1925 87 y.o. Admit date: 08/03/2020   Date of Consult: 08/05/2020 Primary Physician: Adin Hector, MD Primary Cardiologist: Ubaldo Glassing  Chief Complaint:  Chief Complaint  Patient presents with  . Shortness of Breath   Reason for Consult: Atrial fibrillation with heart failure  HPI: 87 y.o. female with known chronic nonvalvular atrial fibrillation chronic kidney disease stage III coronary artery atherosclerosis hypertension hyperlipidemia and chronic systolic dysfunction congestive heart failure with valvular heart disease having acute progression of shortness of breath lower extremity edema and pulmonary edema causing hypoxia.  The patient has had these progression of symptoms over the last several days for which she was seen in the emergency room.  At that time the patient had a glomerular filtration rate of 37 and a BNP of 760 a troponin of 8 and a chest x-ray showing pulmonary edema.  EKG showed atrial fibrillation with nonspecific ST and T wave changes.  Echocardiogram has shown that she has mild global LV systolic dysfunction with ejection fraction of 45% with moderate mitral and tricuspid regurgitation.  The patient has received intravenous Lasix for improvements of symptoms and she is oxygenating better at this time.  She has received metoprolol for better heart rate control which is appropriate.  Currently her telemetry does show some heart rate that is slightly faster but will closely follow for need for further adjustment.  She remains on Eliquis for further risk reduction in stroke with atrial fibrillation without evidence of significant bleeding complications.  There is no current evidence of anginal symptoms and/or acute myocardial infarction  Past Medical History:  Diagnosis Date  . A-fib ([place])   . CHF (congestive heart failure) ([place])   . Coronary artery disease   .  Diverticulosis   . GERD (gastroesophageal reflux disease)   . Gout   . Hypertension   . Osteoporosis       Surgical History:  Past Surgical History:  Procedure Laterality Date  . ABDOMINAL HYSTERECTOMY    . ANKLE SURGERY Left 2011  . KNEE ARTHROSCOPY Right      Home Meds: Prior to Admission medications   Medication Sig Start Date End Date Taking? Authorizing Provider  allopurinol (ZYLOPRIM) 100 MG tablet Take 100 mg by mouth daily.   Yes [provider]  atorvastatin (LIPITOR) 40 MG tablet Take 40 mg by mouth daily. 11/18/16  Yes [provider]  azelastine (ASTELIN) 0.1 % nasal spray Place 1 spray into both nostrils daily. 06/15/20  Yes [provider]  calcium carbonate (OSCAL) 1500 (600 Ca) MG TABS tablet Take 1 tablet by mouth in the morning and at bedtime.   Yes [provider]  Cholecalciferol 50 MCG (2000 UT) CAPS Take 2,000 Units by mouth daily.   Yes [provider]  diltiazem (CARDIZEM CD) 120 MG 24 hr capsule Take 120 mg by mouth daily. 07/21/20  Yes [provider]  ELIQUIS 2.5 MG TABS tablet Take 2.5 mg by mouth 2 (two) times daily. 02/13/20  Yes [provider]  furosemide (LASIX) 20 MG tablet Take 1 tablet (20 mg total) by mouth daily. 02/18/20 02/17/21 Yes Lorella Nimrod, MD  losartan (COZAAR) 100 MG tablet Take 1 tablet (100 mg total) by mouth daily. 06/24/15  Yes Dustin Flock, MD  metoprolol succinate (TOPROL-XL) 200 MG 24 hr tablet Take 1 tablet (200 mg total) by mouth daily. Take with or immediately following a meal. 02/18/20  Yes Lorella Nimrod, MD  omeprazole (PRILOSEC) 40 MG capsule Take 40 mg by mouth daily.    Yes [provider]  oxybutynin (DITROPAN) 5 MG tablet Take 5 mg by mouth at bedtime.   Yes [provider]  potassium chloride (KLOR-CON) 10 MEQ tablet Take 1 tablet (10 mEq total) by mouth daily. 02/18/20  Yes Lorella Nimrod, MD    Inpatient Medications:  . allopurinol  100 mg  Oral Daily  . apixaban  2.5 mg Oral BID  . aspirin EC  81 mg Oral Daily  . atorvastatin  40 mg Oral Daily  . doxycycline  100 mg Oral Q12H  . fluticasone  2 spray Each Nare Daily  . losartan  25 mg Oral Daily  . mouth rinse  15 mL Mouth Rinse BID  . metoprolol  50 mg Oral Daily  . oxybutynin  5 mg Oral QHS  . pantoprazole  40 mg Oral Daily  . predniSONE  40 mg Oral Q breakfast  . sodium chloride flush  3 mL Intravenous Q12H   . sodium chloride      Allergies:  Allergies  Allergen Reactions  . Keflex [Cephalexin] Other (See Comments)    Pt cant remember exact reaction  . Macrodantin [Nitrofurantoin Macrocrystal] Other (See Comments)    Social History   Socioeconomic History  . Marital status: Widowed    Spouse name: Not on file  . Number of children: Not on file  . Years of education: Not on file  . Highest education level: Not on file  Occupational History  . Not on file  Tobacco Use  . Smoking status: Never Smoker  . Smokeless tobacco: Never Used  Substance and Sexual Activity  . Alcohol use: No    Alcohol/week: 0.0 standard drinks  . Drug use: No  . Sexual activity: Never  Other Topics Concern  . Not on file  Social History Narrative  . Not on file   Social Determinants of Health   Financial Resource Strain: Not on file  Food Insecurity: Not on file  Transportation Needs: Not on file  Physical Activity: Not on file  Stress: Not on file  Social Connections: Not on file  Intimate Partner Violence: Not on file     Family History  Problem Relation Age of Onset  . CAD Mother   . Hypertension Sister      Review of Systems Positive for shortness of breath Negative for: General:  chills, fever, night sweats or weight changes.  Cardiovascular: PND orthopnea syncope dizziness  Dermatological skin lesions rashes Respiratory: Cough congestion Urologic: Frequent urination urination at night and hematuria Abdominal: negative for nausea, vomiting, diarrhea,  bright red blood per rectum, melena, or hematemesis Neurologic: negative for visual changes, and/or hearing changes  All other systems reviewed and are otherwise negative except as noted above.  Labs: No results for input(s): CKTOTAL, CKMB, TROPONINI in the last 72 hours. Lab Results  Component Value Date   WBC 10.2 08/05/2020   HGB 12.0 08/05/2020   HCT 36.7 08/05/2020   MCV 88.9 08/05/2020   PLT 180 08/05/2020    Recent Labs  Lab 08/05/20 0417  NA 132*  K 4.4  CL 96*  CO2 26  BUN 26*  CREATININE 1.35*  CALCIUM 8.5*  GLUCOSE 121*   Lab Results  Component Value Date   CHOL 186 02/19/2017   HDL 50 02/19/2017   LDLCALC 112 (H) 02/19/2017   TRIG 118 02/19/2017   No results found for:  DDIMER  Radiology/Studies:  DG Chest 2 View  Result Date: 08/03/2020 CLINICAL DATA:  Orthopnea for 3 months. Cough and worsening shortness of breath. EXAM: CHEST - 2 VIEW COMPARISON:  02/16/2020 FINDINGS: Cardiomegaly again noted. Increased diffuse interstitial infiltrates are suspicious for interstitial edema. Increased bibasilar atelectasis is seen as well as tiny bilateral pleural effusions. No evidence of pulmonary consolidation. IMPRESSION: Increased diffuse interstitial infiltrates, suspicious for interstitial edema. Increased bibasilar atelectasis and tiny bilateral pleural effusions. Stable cardiomegaly. Electronically Signed   By: Marlaine Hind M.D.   On: 08/03/2020 10:55   ECHOCARDIOGRAM COMPLETE  Result Date: 08/04/2020    ECHOCARDIOGRAM REPORT   Patient Name:   Allison Novak Date of Exam: 08/03/2020 Medical Rec #:  [id number]       Height:       63.0 in Accession #:    [id number]      Weight:       139.2 lb Date of Birth:  May 21, 1925       BSA:          1.658 m Patient Age:    42 years        BP:           118/67 mmHg Patient Gender: F               HR:           97 bpm. Exam Location:  [facility] Procedure: 2D Echo, Cardiac Doppler and Color Doppler Indications:     Dyspnea R06.00  History:          Patient has prior history of Echocardiogram examinations. CHF;                  Risk Factors:Hypertension.  Sonographer:     Alyse Low Roar Referring Phys:  [id number] AMY N COX Diagnosing Phys: Serafina Royals MD IMPRESSIONS  1. Left ventricular ejection fraction, by estimation, is 45 to 50%. The left ventricle has mildly decreased function. The left ventricle has no regional wall motion abnormalities. Left ventricular diastolic parameters are consistent with Grade II diastolic dysfunction (pseudonormalization).  2. Right ventricular systolic function is normal. The right ventricular size is normal.  3. Left atrial size was moderately dilated.  4. Right atrial size was moderately dilated.  5. The mitral valve is normal in structure. Moderate mitral valve regurgitation.  6. Tricuspid valve regurgitation is moderate.  7. The aortic valve is normal in structure. Aortic valve regurgitation is trivial. FINDINGS  Left Ventricle: Left ventricular ejection fraction, by estimation, is 45 to 50%. The left ventricle has mildly decreased function. The left ventricle has no regional wall motion abnormalities. The left ventricular internal cavity size was normal in size. There is no left ventricular hypertrophy. Left ventricular diastolic parameters are consistent with Grade II diastolic dysfunction (pseudonormalization). Right Ventricle: The right ventricular size is normal. No increase in right ventricular wall thickness. Right ventricular systolic function is normal. Left Atrium: Left atrial size was moderately dilated. Right Atrium: Right atrial size was moderately dilated. Pericardium: There is no evidence of pericardial effusion. Mitral Valve: The mitral valve is normal in structure. Moderate mitral valve regurgitation. Tricuspid Valve: The tricuspid valve is normal in structure. Tricuspid valve regurgitation is moderate. Aortic Valve: The aortic valve is normal in structure. Aortic valve regurgitation is trivial. Aortic  valve peak gradient measures 5.2 mmHg. Pulmonic Valve: The pulmonic valve was normal in structure. Pulmonic valve regurgitation is not visualized. Aorta: The aortic root and ascending aorta are structurally  normal, with no evidence of dilitation. IAS/Shunts: No atrial level shunt detected by color flow Doppler.  LEFT VENTRICLE PLAX 2D LVIDd:         3.94 cm  Diastology LVIDs:         2.92 cm  LV e' medial:    6.64 cm/s LV PW:         0.89 cm  LV E/e' medial:  17.0 LV IVS:        0.93 cm  LV e' lateral:   8.16 cm/s LVOT diam:     1.70 cm  LV E/e' lateral: 13.8 LVOT Area:     2.27 cm  RIGHT VENTRICLE RV Mid diam:    2.93 cm RV S prime:     12.20 cm/s TAPSE (M-mode): 1.3 cm LEFT ATRIUM             Index       RIGHT ATRIUM           Index LA diam:        3.90 cm 2.35 cm/m  RA Area:     22.10 cm LA Vol (A2C):   70.4 ml 42.47 ml/m RA Volume:   66.50 ml  40.12 ml/m LA Vol (A4C):   63.3 ml 38.19 ml/m LA Biplane Vol: 70.2 ml 42.35 ml/m  AORTIC VALVE                PULMONIC VALVE AV Area (Vmax): 1.81 cm    PV Vmax:        0.90 m/s AV Vmax:        114.00 cm/s PV Peak grad:   3.2 mmHg AV Peak Grad:   5.2 mmHg    RVOT Peak grad: 1 mmHg LVOT Vmax:      90.80 cm/s  AORTA Ao Root diam: 2.60 cm MITRAL VALVE                TRICUSPID VALVE MV Area (PHT): 4.96 cm     TR Peak grad:   43.8 mmHg MV Decel Time: 153 msec     TR Vmax:        331.00 cm/s MV E velocity: 113.00 cm/s                             SHUNTS                             Systemic Diam: 1.70 cm Serafina Royals MD Electronically signed by Serafina Royals MD Signature Date/Time: 08/04/2020/8:23:13 AM    Final     EKG: Atrial fibrillation with nonspecific ST and T wave changes  Weights: Filed Weights   08/03/20 1033 08/04/20 1716 08/05/20 0422  Weight: 63.1 kg 61.3 kg 60.5 kg     Physical Exam: Blood pressure 106/61, pulse 100, temperature (!) 97.4 F (36.3 C), temperature source Oral, resp. rate 18, height 5\' 1"  (1.549 m), weight 60.5 kg, SpO2 98 %. Body  mass index is 25.21 kg/m. General: Well developed, well nourished, in no acute distress. Head eyes ears nose throat: Normocephalic, atraumatic, sclera non-icteric, no xanthomas, nares are without discharge. No apparent thyromegaly and/or mass  Lungs: Normal respiratory effort.  no wheezes, bibasilar rales, few rhonchi.  Heart: Irregular with normal S1 S2. no murmur gallop, no rub, PMI is normal size and placement, carotid upstroke normal without bruit, jugular venous pressure is normal Abdomen: Soft, non-tender, non-distended  with normoactive bowel sounds. No hepatomegaly. No rebound/guarding. No obvious abdominal masses. Abdominal aorta is normal size without bruit Extremities: Trace edema. no cyanosis, no clubbing, no ulcers  Peripheral : 2+ bilateral upper extremity pulses, 2+ bilateral femoral pulses, 2+ bilateral dorsal pedal pulse Neuro: Alert and oriented. No facial asymmetry. No focal deficit. Moves all extremities spontaneously. Musculoskeletal: Normal muscle tone without kyphosis Psych:  Responds to questions appropriately with a normal affect.    Assessment: 87 year old female with acute on chronic systolic dysfunction congestive heart failure hypertension hyperlipidemia coronary artery disease chronic kidney disease stage III with pulmonary edema and no current evidence of myocardial infarction  Plan: 1.  Gentle diuresis with intravenous furosemide watching closely for worsening chronic kidney disease and will consider oral diuretic for discharge 2.  Continue heart rate control with metoprolol for goal heart rate between 60 and 90 bpm at rest which likely has potentially caused her current symptoms of heart failure 3.  Continue anticoagulation with Eliquis at 2.5 mg twice per day without change 4.  High intensity cholesterol therapy 5.  Hypertension control and treatment of chronic systolic dysfunction congestive heart failure with losartan 6.  Begin ambulation and consider the  possibility of discharge tomorrow if patient improving after above  Signed, Corey Skains M.D. [facility] 08/05/2020, 5:32 PM

## 2020-08-05 NOTE — Consult Note (Signed)
   Heart Failure Nurse Navigator Note  HFmrEF 97-94% grade 2 diastolic dysfunction.  Normal right ventricular systolic function.  Moderate biatrial enlargement.  Mild mitral regurgitation mild tricuspid regurgitation.  He presented to the emergency room with complaints of increasing shortness of breath.  Comorbidities:  Hypertension Hyperlipidemia Atrial fibrillation Coronary artery disease  Medications:  Eliquis 2.5 mg 2 times a day Aspirin 81 mg daily Atorvastatin 40 mg daily  Losartan 25 mg daily Metoprolol succinate 50 mg daily  Lasix 20 mg daily has been discontinued due to acute kidney injury.   Labs:  Sodium 132, potassium 4.4, chloride 96, CO2 26, BUN 26, creatinine 1.35 up from 1.07 of yesterday, magnesium 1.7, phosphorus 3.8, Intake 180 mL Output 900 mL Weight is 60.5 kg down from 61.3. Blood pressure 106/61. BMI 25.   Assessment:  General-she is awake and alert lying in bed in no acute distress.  She does have a harsh bark-like cough.  That is nonproductive.  HEENT-pupils are equal, wears glasses.  No JVD.  Cardiac-heart tones of irregular rate and rhythm.  Chest-breath sounds are clear to posterior auscultation.  Abdomen-soft nontender  Musculoskeletal-there is no lower extremity edema  Psych-is pleasant and appropriate, makes good eye contact.  Neurologic-speech is clear, moves all extremities without difficulty.   Initial meeting with patient today.  She states that she weighs herself daily and knows to report a 2 pound weight gain in a day or 5 pounds within a week.  She states at home in fact at 1 point she had noted a weight loss and stopped taking her Lasix.  She denied any PND, orthopnea or early satiety.  She is compliant with less than 2 L fluid restriction, she does her own cooking does not use foods that are high in sodium. States that she has her son checks on herself daily.  Discussed following up in the heart failure clinic as an  outpatient.  Pricilla Riffle RN [facility]

## 2020-08-05 NOTE — Progress Notes (Signed)
Mobility Specialist - Progress Note   08/05/20 1400  Mobility  Activity Ambulated in hall  Level of Assistance Modified independent, requires aide device or extra time  Assistive Device Front wheel walker;None  Distance Ambulated (ft) 350 ft  Mobility Response Tolerated well  Mobility performed by Mobility specialist  $Mobility charge 1 Mobility    Pre-mobility: 123 HR, 90% SpO2 During mobility: 144 HR Post-mobility: 114 HR, 92% SpO2   Pt ambulated in hallway with RW then progressing to no AD. No LOB. Denied SOB. O2 reading at 88% on RA, but pulse ox readings not equivalent with telemetry so unable to determine accuracy. ModI.    Kathee Delton Mobility Specialist 08/05/20, 2:52 PM

## 2020-08-05 NOTE — Progress Notes (Addendum)
SLP F/U Note  Patient Details Name: AMAI CAPPIELLO MRN: [id number] DOB: 1925-10-16   Cancelled treatment:       Reason Eval/Treat Not Completed:  (chart reviewed; consulted pt/Dtr in room; NSG).  No reports of swallowing difficulties noted per NSG, chart notes. Upon meeting w/ pt and Dtr in room for f/u from eval yesterday, noted pt continues to exhibit a bark-like, hacking cough that is nonproductive. She endorsed the cough occurs at varying times including when lying flat. She consumed 2 swallows of water via bottle while this SLP was in the room w/ No overt, clinical s/s of aspiration noted - No cough surrounding the oral intake/swallowing. Noted Cardiac f/u. Again, recommended general aspiration and REFLUX precautions as pt has a Large Hiatal Hernia. No Straws. Monitor exertion during meals and take Rest Breaks.  No further skilled ST services indicated. NSG to reconsult if new swallowing issues arise during this admit. Pt and Dtr agreed.     Orinda Kenner, MS, CCC-SLP Speech Language Pathologist Rehab Services [phone] [facility] 08/05/2020, 3:56 PM

## 2020-08-05 NOTE — TOC Initial Note (Signed)
Transition of Care [facility]) - Initial/Assessment Note    Patient Details  Name: Allison Novak MRN: [id number] Date of Birth: 1926-03-10  [facility]) CM/SW Contact:    Eileen Stanford, LCSW Phone Number: 08/05/2020, 2:08 PM  Clinical Narrative:   CSW spoke with pt at bedside. Pt states she lives alone. Pt states she had HH in the past back in October and used [facility]. Pt states she didn't need it very long. Pt is agreeable to use [facility] again. Pt states she has all the DME she needs at home expect bedside commode. CSW will order through [facility]. [facility] notified of referral.  Pt states herself or her daughter takes her to appointments. Pt is established pt with Dr Caryl Comes. Pt states she uses [facility] in [place] to get her medications. Pt also states she has no issues affording her medications.  Pt states she has a scale at home and is aware to weigh herself daily and contact her MD if weight gain.                Expected Discharge Plan: [place] Barriers to Discharge: Continued Medical Work up   Patient Goals and CMS Choice Patient states their goals for this hospitalization and ongoing recovery are:: to go home   Choice offered to / list presented to : Patient  Expected Discharge Plan and Services Expected Discharge Plan: [place] In-house Referral: Clinical Social Work   Post Acute Care Choice: [place] arrangements for the past 2 months: Single Family Home                 DME Arranged: Bedside commode DME Agency: [facility] Date DME Agency Contacted: 08/05/20 Time DME Agency Contacted: [id number] Representative spoke with at DME Agency: zach [place]: PT,OT [place] Agency: [place] ([place]) Date [place]: 08/05/20 Time [place]: 23 Representative spoke with at [place]  Prior Living Arrangements/Services Living arrangements for the past 2 months: [place] with:: Self Patient language and need for interpreter reviewed:: Yes Do you feel safe going back to the place where you live?: Yes      Need for Family Participation in Patient Care: Yes (Comment) Care giver support system in place?: Yes (comment)   Criminal Activity/Legal Involvement Pertinent to Current Situation/Hospitalization: No - Comment as needed  Activities of Daily Living Home Assistive Devices/Equipment: Eyeglasses ADL Screening (condition at time of admission) Patient's cognitive ability adequate to safely complete daily activities?: Yes Is the patient deaf or have difficulty hearing?: No Does the patient have difficulty seeing, even when wearing glasses/contacts?: No Does the patient have difficulty concentrating, remembering, or making decisions?: No Patient able to express need for assistance with ADLs?: Yes Does the patient have difficulty dressing or bathing?: No Independently performs ADLs?: Yes (appropriate for developmental age) Does the patient have difficulty walking or climbing stairs?: Yes Weakness of Legs: Both Weakness of Arms/Hands: None  Permission Sought/Granted Permission sought to share information with : Family Supports Permission granted to share information with : Yes, Verbal Permission Granted  Share Information with NAME: Hoyle Sauer  Permission granted to share info w AGENCY: [facility]  Permission granted to share info w Relationship: daughter     Emotional Assessment Appearance:: Appears stated age Attitude/Demeanor/Rapport: Engaged Affect (typically observed): Accepting,Appropriate Orientation: : Oriented to Self,Oriented to Place,Oriented to Situation,Oriented to  Time Alcohol / Substance Use: Not Applicable Psych Involvement: No (comment)  Admission diagnosis:  Shortness of breath [R06.02] SOB (shortness of breath) [R06.02] Acute exacerbation of CHF (congestive heart failure) (HCC) [I50.9] Acute on chronic systolic congestive  heart failure ([place]) [I50.23] Patient Active Problem List   Diagnosis Date Noted  . Atrial fibrillation, chronic ([facility]) 08/04/2020  . Acute exacerbation of CHF (congestive heart failure) ([place]) 08/04/2020  . Shortness of breath 08/03/2020  . Hypokalemia   . Palpitations   . Hyperlipidemia 02/16/2020  . Atrial fibrillation with RVR ([place]) 02/16/2020  . Hypoxia 02/16/2020  . Acute CHF (congestive heart failure) ([place]) 02/16/2020  . Chronic anticoagulation 02/16/2020  . History of TIA (transient ischemic attack) 02/23/2017  . Carotid artery disease ([place]) 02/23/2017  . TIA (transient ischemic attack) 02/18/2017  . GERD (gastroesophageal reflux disease) 02/18/2017  . Chest pain 06/23/2015  . HTN (hypertension), malignant 06/23/2015  . Essential hypertension 06/16/2015  . Aortic atherosclerosis ([place]) 06/16/2015   PCP:  Adin Hector, MD Pharmacy:   [facility] [place], [place] - [address] [place] [address] Phone: [phone] Fax: [place] #[id number] [place], [place] - [address] [place] [address] [place] [address] Phone: [phone] Fax: [phone]     Social Determinants of Health (SDOH) Interventions    Readmission Risk Interventions No flowsheet data found.

## 2020-08-05 NOTE — Progress Notes (Signed)
PROGRESS NOTE  Allison Novak CVE:[id number] DOB: 1925/08/19 DOA: 08/03/2020 PCP: Adin Hector, MD   LOS: 1 day   Brief Narrative / Interim history: 87 year old female with chronic combined CHF, HTN, HLD, history of A. fib on Eliquis, prior TIA, CAD comes to the hospital with worsening shortness of breath.  Patient tells me that this has been going on for the past couple of months but more so in the last week prior to admission.  States that she has been compliant with diet as well as fluid restriction at home.  She thinks that she has gained 3 to 4 pounds in the last week.  She also been having a chronic nonproductive cough for the same amount of time.  Shortness of breath is worse with exertion and worse when she lays flat.  Subjective / 24h Interval events: She does not really appreciate significant improvement, but thinks that she is feeling better.  Still has the cough this morning  Assessment & Plan: Principal Problem Acute hypoxic respiratory failure due to acute on chronic combined CHF -Patient was admitted to the hospital, there was evidence of fluid overload on admission, received Lasix just for couple doses and developing acute kidney injury this morning -2D echo showed LVEF 02-58%, grade 2 diastolic dysfunction. -Seems to be quite sensitive to Lasix, given hypochloremia suspect intravascular depletion.  I will discontinue Lasix.  I consulted cardiology, appreciate input with assistance in best managing this patient  Active Problems AKI -hold further lasix, suspect a degree of intravascular depletion.  Creatinine on admission 0.94, currently 1.35  Chronic bronchitis -Started on doxycycline and prednisone for a few days, continue.  Still coughing this morning  Permanent A. fib -Rate control with Toprol, continue Eliquis  CAD -No chest pain  Scheduled Meds: . allopurinol  100 mg Oral Daily  . apixaban  2.5 mg Oral BID  . aspirin EC  81 mg Oral Daily  . atorvastatin   40 mg Oral Daily  . doxycycline  100 mg Oral Q12H  . fluticasone  2 spray Each Nare Daily  . losartan  25 mg Oral Daily  . mouth rinse  15 mL Mouth Rinse BID  . metoprolol  50 mg Oral Daily  . oxybutynin  5 mg Oral QHS  . pantoprazole  40 mg Oral Daily  . predniSONE  40 mg Oral Q breakfast  . sodium chloride flush  3 mL Intravenous Q12H   Continuous Infusions: . sodium chloride     PRN Meds:.sodium chloride, acetaminophen, guaiFENesin-dextromethorphan, ondansetron (ZOFRAN) IV, sodium chloride flush  Diet Orders (From admission, onward)    Start     Ordered   08/04/20 1718  Diet Heart Room service appropriate? Yes with Assist; Fluid consistency: Thin  Diet effective now       Comments: NO Straws.  Extra Gravy on cut meats.  Question Answer Comment  Room service appropriate? Yes with Assist   Fluid consistency: Thin      08/04/20 1719          DVT prophylaxis: apixaban (ELIQUIS) tablet 2.5 mg Start: 08/03/20 1445 Place TED hose Start: 08/03/20 1436 apixaban (ELIQUIS) tablet 2.5 mg     Code Status: Partial Code  Family Communication: daughter at bedside   Status is: Inpatient  Remains inpatient appropriate because:Inpatient level of care appropriate due to severity of illness   Dispo:  Patient From: Home  Planned Disposition: Home with Health Care Svc  Medically stable for discharge: No  Level of care: Med-Surg  Consultants:  Cardiology   Procedures:  2D echo  Microbiology  none  Antimicrobials: Doxycycline     Objective: Vitals:   08/04/20 1721 08/04/20 2014 08/05/20 0422 08/05/20 0831  BP:  100/62 111/68 106/61  Pulse: 63 100 97 100  Resp: 20 18 19 18   Temp:  97.8 F (36.6 C) (!) 97.5 F (36.4 C) (!) 97.4 F (36.3 C)  TempSrc:   Oral Oral  SpO2: 96% 97% 97% 98%  Weight:   60.5 kg   Height:        Intake/Output Summary (Last 24 hours) at 08/05/2020 0942 Last data filed at 08/05/2020 0500 Gross per 24 hour  Intake 180 ml  Output 900 ml   Net -720 ml   Filed Weights   08/03/20 1033 08/04/20 1716 08/05/20 0422  Weight: 63.1 kg 61.3 kg 60.5 kg    Examination:  Constitutional: NAD Eyes: no scleral icterus ENMT: Mucous membranes are moist.  Neck: normal, supple Respiratory: Diminished at the bases, no significant wheezing or crackles.  Normal respiratory effort Cardiovascular: Irregular, no peripheral edema Abdomen: non distended, no tenderness. Bowel sounds positive.  Musculoskeletal: no clubbing / cyanosis.  Skin: no rashes Neurologic: CN 2-12 grossly intact. Strength 5/5 in all 4.  Psychiatric: Normal judgment and insight. Alert and oriented x 3. Normal mood.   Data Reviewed: I have independently reviewed following labs and imaging studies   CBC: Recent Labs  Lab 08/03/20 1032 08/04/20 0419 08/05/20 0417  WBC 14.5* 11.2* 10.2  NEUTROABS  --  8.1*  --   HGB 13.2 10.9* 12.0  HCT 40.8 34.0* 36.7  MCV 89.5 89.7 88.9  PLT 209 166 893   Basic Metabolic Panel: Recent Labs  Lab 08/03/20 1032 08/04/20 0419 08/05/20 0417  NA 135 133* 132*  K 3.8 3.4* 4.4  CL 99 96* 96*  CO2 24 29 26   GLUCOSE 119* 98 121*  BUN 15 15 26*  CREATININE 0.94 1.07* 1.35*  CALCIUM 9.3 8.3* 8.5*  MG  --  1.8 1.7  PHOS  --   --  3.8   Liver Function Tests: No results for input(s): AST, ALT, ALKPHOS, BILITOT, PROT, ALBUMIN in the last 168 hours. Coagulation Profile: No results for input(s): INR, PROTIME in the last 168 hours. HbA1C: No results for input(s): HGBA1C in the last 72 hours. CBG: No results for input(s): GLUCAP in the last 168 hours.  Recent Results (from the past 240 hour(s))  SARS CORONAVIRUS 2 (TAT 6-24 HRS) Nasopharyngeal Nasopharyngeal Swab     Status: None   Collection Time: 08/03/20  2:15 PM   Specimen: Nasopharyngeal Swab  Result Value Ref Range Status   SARS Coronavirus 2 NEGATIVE NEGATIVE Final    Comment: (NOTE) SARS-CoV-2 target nucleic acids are NOT DETECTED.  The SARS-CoV-2 RNA is generally  detectable in upper and lower respiratory specimens during the acute phase of infection. Negative results do not preclude SARS-CoV-2 infection, do not rule out co-infections with other pathogens, and should not be used as the sole basis for treatment or other patient management decisions. Negative results must be combined with clinical observations, patient history, and epidemiological information. The expected result is Negative.  Fact Sheet for Patients: SugarRoll.be  Fact Sheet for Healthcare Providers: https://www.woods-mathews.com/  This test is not yet approved or cleared by the [place] FDA and  has been authorized for detection and/or diagnosis of SARS-CoV-2 by FDA under an Emergency Use Authorization (EUA). This EUA will remain  in effect (meaning this test can be used) for the duration of the COVID-19 declaration under Se ction 564(b)(1) of the Act, 21 U.S.C. section 360bbb-3(b)(1), unless the authorization is terminated or revoked sooner.  Performed at [facility], [place] [address], [place], [place] [address]      Radiology Studies: No results found.  Marzetta Board, MD, PhD [facility]  Between 7 am - 7 pm I am available, please contact me via Amion or Securechat  Between 7 pm - 7 am I am not available, please contact night coverage MD/APP via Amion

## 2020-08-05 NOTE — Progress Notes (Signed)
Physical Therapy Treatment Patient Details Name: Allison Novak MRN: [id number] DOB: 07-Jul-1925 Today's Date: 08/05/2020    History of Present Illness 87 y.o. female with medical history significant for heart failure reduced ejection fraction, hypertension, hyperlipidemia, history of atrial fibrillation on Eliquis, history of TIA, coronary artery disease and aortic atherosclerosis, carotid artery disease, presents to the emergency department for chief concerns of worsening shortness of breath.    PT Comments    Patient received in bed, son present in room. Patient reports she is still coughing, but feeling a bit better. She is agreeable to PT. Patient is mod independent with bed mobility and transfers with min guard. She is able to walk around nursing station with single hand held assist. Patient will continue to benefit from skilled PT while here to improve functional mobility, balance and safety.      Follow Up Recommendations  Home health PT     Equipment Recommendations  None recommended by PT    Recommendations for Other Services       Precautions / Restrictions Precautions Precautions: Fall Restrictions Weight Bearing Restrictions: No    Mobility  Bed Mobility Overal bed mobility: Modified Independent Bed Mobility: Supine to Sit;Sit to Supine     Supine to sit: Modified independent (Device/Increase time) Sit to supine: Modified independent (Device/Increase time)        Transfers Overall transfer level: Needs assistance Equipment used: 1 person hand held assist Transfers: Sit to/from Stand Sit to Stand: Min guard            Ambulation/Gait Ambulation/Gait assistance: Min guard Gait Distance (Feet): 175 Feet Assistive device: 1 person hand held assist Gait Pattern/deviations: Step-through pattern;Decreased step length - right;Decreased step length - left;Narrow base of support Gait velocity: decr   General Gait Details: Patient continues to be coughing  but seems improved from yesterday.   Stairs             Wheelchair Mobility    Modified Rankin (Stroke Patients Only)       Balance Overall balance assessment: Needs assistance Sitting-balance support: Feet supported Sitting balance-Leahy Scale: Good     Standing balance support: Single extremity supported;During functional activity Standing balance-Leahy Scale: Good Standing balance comment: generally steady with gait. Min guard                            Cognition Arousal/Alertness: Awake/alert Behavior During Therapy: WFL for tasks assessed/performed Overall Cognitive Status: Within Functional Limits for tasks assessed                                        Exercises      General Comments        Pertinent Vitals/Pain Pain Assessment: No/denies pain    Home Living                      Prior Function            PT Goals (current goals can now be found in the care plan section) Acute Rehab PT Goals Patient Stated Goal: to go home PT Goal Formulation: With patient Time For Goal Achievement: 08/18/20 Potential to Achieve Goals: Good Progress towards PT goals: Progressing toward goals    Frequency    Min 2X/week      PT Plan Current plan remains appropriate  Co-evaluation              AM-PAC PT "6 Clicks" Mobility   Outcome Measure  Help needed turning from your back to your side while in a flat bed without using bedrails?: A Little Help needed moving from lying on your back to sitting on the side of a flat bed without using bedrails?: A Little Help needed moving to and from a bed to a chair (including a wheelchair)?: A Little Help needed standing up from a chair using your arms (e.g., wheelchair or bedside chair)?: A Little Help needed to walk in hospital room?: A Little Help needed climbing 3-5 steps with a railing? : A Little 6 Click Score: 18    End of Session Equipment Utilized During  Treatment: Gait belt Activity Tolerance: Patient tolerated treatment well Patient left: in bed;with call bell/phone within reach;with bed alarm set Nurse Communication: Mobility status PT Visit Diagnosis: Muscle weakness (generalized) (M62.81);Difficulty in walking, not elsewhere classified (R26.2)     Time: 2725-3664 PT Time Calculation (min) (ACUTE ONLY): 17 min  Charges:  $Gait Training: 8-22 mins                     [employer], PT, G[employer] 08/05/20,12:09 PM

## 2020-08-06 DIAGNOSIS — I5023 Acute on chronic systolic (congestive) heart failure: Secondary | ICD-10-CM

## 2020-08-06 DIAGNOSIS — I6523 Occlusion and stenosis of bilateral carotid arteries: Secondary | ICD-10-CM

## 2020-08-06 LAB — CBC
HCT: 34.2 % — ABNORMAL LOW (ref 36.0–46.0)
Hemoglobin: 11.3 g/dL — ABNORMAL LOW (ref 12.0–15.0)
MCH: 29.2 pg (ref 26.0–34.0)
MCHC: 33 g/dL (ref 30.0–36.0)
MCV: 88.4 fL (ref 80.0–100.0)
Platelets: 219 10*3/uL (ref 150–400)
RBC: 3.87 MIL/uL (ref 3.87–5.11)
RDW: 18.5 % — ABNORMAL HIGH (ref 11.5–15.5)
WBC: 10.3 10*3/uL (ref 4.0–10.5)
nRBC: 0 % (ref 0.0–0.2)

## 2020-08-06 LAB — BASIC METABOLIC PANEL
Anion gap: 9 (ref 5–15)
BUN: 31 mg/dL — ABNORMAL HIGH (ref 8–23)
CO2: 26 mmol/L (ref 22–32)
Calcium: 8.7 mg/dL — ABNORMAL LOW (ref 8.9–10.3)
Chloride: 97 mmol/L — ABNORMAL LOW (ref 98–111)
Creatinine, Ser: 1.17 mg/dL — ABNORMAL HIGH (ref 0.44–1.00)
GFR, Estimated: 44 mL/min — ABNORMAL LOW (ref >60)
Glucose, Bld: 102 mg/dL — ABNORMAL HIGH (ref 70–99)
Potassium: 3.8 mmol/L (ref 3.5–5.1)
Sodium: 132 mmol/L — ABNORMAL LOW (ref 135–145)

## 2020-08-06 MED ORDER — METOPROLOL SUCCINATE ER 100 MG PO TB24
100.0000 mg | ORAL_TABLET | Freq: Every day | ORAL | Status: DC
  Administered 2020-08-06: 100 mg via ORAL
  Filled 2020-08-06: qty 1

## 2020-08-06 MED ORDER — GUAIFENESIN-DM 100-10 MG/5ML PO SYRP: 5.0000 mL | ORAL_SOLUTION | ORAL | 0 refills | Status: DC | PRN

## 2020-08-06 MED ORDER — DOXYCYCLINE HYCLATE 100 MG PO TABS: 100.0000 mg | ORAL_TABLET | Freq: Two times a day (BID) | ORAL | 0 refills | Status: AC

## 2020-08-06 MED ORDER — PREDNISONE 20 MG PO TABS: 40.0000 mg | ORAL_TABLET | Freq: Every day | ORAL | 0 refills | Status: AC

## 2020-08-06 MED ORDER — FUROSEMIDE 20 MG PO TABS: 40.0000 mg | ORAL_TABLET | Freq: Every day | ORAL | 0 refills | Status: DC

## 2020-08-06 MED ORDER — FUROSEMIDE 40 MG PO TABS
40.0000 mg | ORAL_TABLET | Freq: Every day | ORAL | Status: DC
  Administered 2020-08-06: 40 mg via ORAL
  Filled 2020-08-06: qty 1

## 2020-08-06 MED ORDER — BENZONATATE 200 MG PO CAPS: 200.0000 mg | ORAL_CAPSULE | Freq: Three times a day (TID) | ORAL | 0 refills | Status: DC | PRN

## 2020-08-06 MED ORDER — ZOLPIDEM TARTRATE 5 MG PO TABS
5.0000 mg | ORAL_TABLET | Freq: Every evening | ORAL | Status: DC | PRN
  Administered 2020-08-06: 5 mg via ORAL
  Filled 2020-08-06: qty 1

## 2020-08-06 NOTE — Progress Notes (Signed)
Provider notified that pts HR has been elevated this shift. No new orders received.

## 2020-08-06 NOTE — Plan of Care (Signed)

## 2020-08-06 NOTE — Progress Notes (Signed)
[facility] Encounter Note  Patient: Allison Novak / Admit Date: 08/03/2020 / Date of Encounter: 08/06/2020, 8:40 AM   Subjective: Patient overall significantly improved since admission with intravenous Lasix yesterday.  The patient although did not have much significant urine output other than about 500 mL.  The patient does have clear lungs at this morning and may need continuation of oral Lasix but currently concerns for intravenous Lasix causing chronic kidney disease.  Crohn's glomerular filtration rate appears to be stable throughout the last 24 hours.  There has been no evidence of chest discomfort or myocardial infarction with relatively low troponin.  Atrial fibrillation has a slightly higher heart rate due to the fact that her metoprolol dose was dropped.  We will increase metoprolol dose today for better heart rate control for 60 to 90 bpm at rest.  Patient has a significant cough and congestion but appears to be in her upper respiratory tract and or possibly to some bronchitis.  There is no evidence of purulent sputum at this time.  It may be viral in nature  Review of Systems: Positive for: Cough congestion Negative for: Vision change, hearing change, syncope, dizziness, nausea, vomiting,diarrhea, bloody stool, stomach pain, positive for cough, congestion, negative for diaphoresis, urinary frequency, urinary pain,skin lesions, skin rashes Others previously listed  Objective: Telemetry: Atrial fibrillation with more rapid rate Physical Exam: Blood pressure 110/78, pulse 90, temperature 98.1 F (36.7 C), temperature source Oral, resp. rate 18, height 5\' 1"  (1.549 m), weight 62.5 kg, SpO2 94 %. Body mass index is 26.04 kg/m. General: Well developed, well nourished, in no acute distress. Head: Normocephalic, atraumatic, sclera non-icteric, no xanthomas, nares are without discharge. Neck: No apparent masses Lungs: Normal respirations with no wheezes, no rhonchi,  no rales , no crackles   Heart: Irregular rate and rhythm, normal S1 S2, no murmur, no rub, no gallop, PMI is normal size and placement, carotid upstroke normal without bruit, jugular venous pressure normal Abdomen: Soft, non-tender, non-distended with normoactive bowel sounds. No hepatosplenomegaly. Abdominal aorta is normal size without bruit Extremities: Trace edema, no clubbing, no cyanosis, no ulcers,  Peripheral: 2+ radial, 2+ femoral, 2+ dorsal pedal pulses Neuro: Alert and oriented. Moves all extremities spontaneously. Psych:  Responds to questions appropriately with a normal affect.   Intake/Output Summary (Last 24 hours) at 08/06/2020 0840 Last data filed at 08/06/2020 0510 Gross per 24 hour  Intake 360 ml  Output 200 ml  Net 160 ml    Inpatient Medications:  . allopurinol  100 mg Oral Daily  . apixaban  2.5 mg Oral BID  . aspirin EC  81 mg Oral Daily  . atorvastatin  40 mg Oral Daily  . doxycycline  100 mg Oral Q12H  . fluticasone  2 spray Each Nare Daily  . furosemide  40 mg Oral Daily  . losartan  25 mg Oral Daily  . mouth rinse  15 mL Mouth Rinse BID  . metoprolol succinate  100 mg Oral Daily  . oxybutynin  5 mg Oral QHS  . pantoprazole  40 mg Oral Daily  . predniSONE  40 mg Oral Q breakfast  . sodium chloride flush  3 mL Intravenous Q12H   Infusions:  . sodium chloride      Labs: Recent Labs    08/04/20 0419 08/05/20 0417 08/06/20 0459  NA 133* 132* 132*  K 3.4* 4.4 3.8  CL 96* 96* 97*  CO2 29 26 26   GLUCOSE 98 121* 102*  BUN 15 26* 31*  CREATININE 1.07* 1.35* 1.17*  CALCIUM 8.3* 8.5* 8.7*  MG 1.8 1.7  --   PHOS  --  3.8  --    No results for input(s): AST, ALT, ALKPHOS, BILITOT, PROT, ALBUMIN in the last 72 hours. Recent Labs    08/04/20 0419 08/05/20 0417 08/06/20 0459  WBC 11.2* 10.2 10.3  NEUTROABS 8.1*  --   --   HGB 10.9* 12.0 11.3*  HCT 34.0* 36.7 34.2*  MCV 89.7 88.9 88.4  PLT 166 180 219   No results for input(s): CKTOTAL, CKMB,  TROPONINI in the last 72 hours. Invalid input(s): POCBNP No results for input(s): HGBA1C in the last 72 hours.   Weights: Filed Weights   08/04/20 1716 08/05/20 0422 08/06/20 0510  Weight: 61.3 kg 60.5 kg 62.5 kg     Radiology/Studies:  DG Chest 2 View  Result Date: 08/03/2020 CLINICAL DATA:  Orthopnea for 3 months. Cough and worsening shortness of breath. EXAM: CHEST - 2 VIEW COMPARISON:  02/16/2020 FINDINGS: Cardiomegaly again noted. Increased diffuse interstitial infiltrates are suspicious for interstitial edema. Increased bibasilar atelectasis is seen as well as tiny bilateral pleural effusions. No evidence of pulmonary consolidation. IMPRESSION: Increased diffuse interstitial infiltrates, suspicious for interstitial edema. Increased bibasilar atelectasis and tiny bilateral pleural effusions. Stable cardiomegaly. Electronically Signed   By: Marlaine Hind M.D.   On: 08/03/2020 10:55   ECHOCARDIOGRAM COMPLETE  Result Date: 08/04/2020    ECHOCARDIOGRAM REPORT   Patient Name:   Allison Novak Date of Exam: 08/03/2020 Medical Rec #:  [id number]       Height:       63.0 in Accession #:    [id number]      Weight:       139.2 lb Date of Birth:  1925-10-19       BSA:          1.658 m Patient Age:    33 years        BP:           118/67 mmHg Patient Gender: F               HR:           97 bpm. Exam Location:  [facility] Procedure: 2D Echo, Cardiac Doppler and Color Doppler Indications:     Dyspnea R06.00  History:         Patient has prior history of Echocardiogram examinations. CHF;                  Risk Factors:Hypertension.  Sonographer:     Alyse Low Roar Referring Phys:  [id number] AMY N [facility] Diagnosing Phys: Serafina Royals MD IMPRESSIONS  1. Left ventricular ejection fraction, by estimation, is 45 to 50%. The left ventricle has mildly decreased function. The left ventricle has no regional wall motion abnormalities. Left ventricular diastolic parameters are consistent with Grade II diastolic dysfunction  (pseudonormalization).  2. Right ventricular systolic function is normal. The right ventricular size is normal.  3. Left atrial size was moderately dilated.  4. Right atrial size was moderately dilated.  5. The mitral valve is normal in structure. Moderate mitral valve regurgitation.  6. Tricuspid valve regurgitation is moderate.  7. The aortic valve is normal in structure. Aortic valve regurgitation is trivial. FINDINGS  Left Ventricle: Left ventricular ejection fraction, by estimation, is 45 to 50%. The left ventricle has mildly decreased function. The left ventricle has no regional wall motion abnormalities. The left ventricular  internal cavity size was normal in size. There is no left ventricular hypertrophy. Left ventricular diastolic parameters are consistent with Grade II diastolic dysfunction (pseudonormalization). Right Ventricle: The right ventricular size is normal. No increase in right ventricular wall thickness. Right ventricular systolic function is normal. Left Atrium: Left atrial size was moderately dilated. Right Atrium: Right atrial size was moderately dilated. Pericardium: There is no evidence of pericardial effusion. Mitral Valve: The mitral valve is normal in structure. Moderate mitral valve regurgitation. Tricuspid Valve: The tricuspid valve is normal in structure. Tricuspid valve regurgitation is moderate. Aortic Valve: The aortic valve is normal in structure. Aortic valve regurgitation is trivial. Aortic valve peak gradient measures 5.2 mmHg. Pulmonic Valve: The pulmonic valve was normal in structure. Pulmonic valve regurgitation is not visualized. Aorta: The aortic root and ascending aorta are structurally normal, with no evidence of dilitation. IAS/Shunts: No atrial level shunt detected by color flow Doppler.  LEFT VENTRICLE PLAX 2D LVIDd:         3.94 cm  Diastology LVIDs:         2.92 cm  LV e' medial:    6.64 cm/s LV PW:         0.89 cm  LV E/e' medial:  17.0 LV IVS:        0.93 cm  LV  e' lateral:   8.16 cm/s LVOT diam:     1.70 cm  LV E/e' lateral: 13.8 LVOT Area:     2.27 cm  RIGHT VENTRICLE RV Mid diam:    2.93 cm RV S prime:     12.20 cm/s TAPSE (M-mode): 1.3 cm LEFT ATRIUM             Index       RIGHT ATRIUM           Index LA diam:        3.90 cm 2.35 cm/m  RA Area:     22.10 cm LA Vol (A2C):   70.4 ml 42.47 ml/m RA Volume:   66.50 ml  40.12 ml/m LA Vol (A4C):   63.3 ml 38.19 ml/m LA Biplane Vol: 70.2 ml 42.35 ml/m  AORTIC VALVE                PULMONIC VALVE AV Area (Vmax): 1.81 cm    PV Vmax:        0.90 m/s AV Vmax:        114.00 cm/s PV Peak grad:   3.2 mmHg AV Peak Grad:   5.2 mmHg    RVOT Peak grad: 1 mmHg LVOT Vmax:      90.80 cm/s  AORTA Ao Root diam: 2.60 cm MITRAL VALVE                TRICUSPID VALVE MV Area (PHT): 4.96 cm     TR Peak grad:   43.8 mmHg MV Decel Time: 153 msec     TR Vmax:        331.00 cm/s MV E velocity: 113.00 cm/s                             SHUNTS                             Systemic Diam: 1.70 cm Serafina Royals MD Electronically signed by Serafina Royals MD Signature Date/Time: 08/04/2020/8:23:13 AM    Final  Assessment and Recommendation  87 y.o. female with acute on chronic systolic dysfunction congestive heart failure likely secondary to atrial fibrillation chronic kidney disease with elevated BNP and no current evidence of acute coronary syndrome and/or myocardial infarction.  There has been concerns of worsening chronic kidney disease and will be gentle with diuresis and further treatment 1.  Continue diuresis gently but changing to oral Lasix at 40 mg each day to continue treatment without causing significant acute kidney syndrome .  Clinically the patient is much improved from the congestive heart failure standpoint 2.  Increase metoprolol to 100 mg each day for better heart rate control add 60 to 90 bpm at rest.  Patient was on 200 mg but may benefit from slight adjustment if ambulating 3.  No further cardiac diagnostics necessary at  this time due to no evidence of acute coronary syndrome or non-ST elevation myocardial infarction 4.  Continue anticoagulation for further risk reduction stroke with atrial fibrillation without change 5.  Further treatment of possible upper respiratory or bronchitis type symptoms which appears to be viral as per internal medicine 6.  If patient ambulating well this afternoon with above adjustments and no further evidence of congestive heart failure okay for discharge home from cardiac standpoint with follow-up next week. 7.  If further questions and/or concerns Dr. Saralyn Pilar is covering for the weekend  Signed, Serafina Royals M.D. FACC

## 2020-08-06 NOTE — Discharge Summary (Signed)
Physician Discharge Summary  Allison Novak HGD:[id number] DOB: 1926/04/11 DOA: 08/03/2020  PCP: Adin Hector, MD  Admit date: 08/03/2020 Discharge date: 08/06/2020  Admitted From: home Disposition:  home  Recommendations for Outpatient Follow-up:  1. Follow up with PCP in 1-2 weeks  Home Health: PT Equipment/Devices: none  Discharge Condition: stable CODE STATUS: Partial code Diet recommendation: low sodium, heart healthy  HPI: Per admitting MD, Allison Novak is a 87 y.o. female with medical history significant for heart failure reduced ejection fraction, hypertension, hyperlipidemia, history of atrial fibrillation on Eliquis, history of TIA, coronary artery disease and aortic atherosclerosis, carotid artery disease, presents to the emergency department for chief concerns of worsening shortness of breath. She reports that shortness of breath has been ongoing for the last several months however the last 2 to 3 days it has been worsening.  She states she is compliant with less than 2 L of p.o. liquid intake.  She denies sick contacts. She also endorses that she thinks she has gained 3 to 4 pounds in the last week.  She also endorses a cough that is not productive.  She states that shortness of breath is worse with exertion and worse when she lays flat.  Hospital Course / Discharge diagnoses: Principal Problem Acute hypoxic respiratory failure due to acute on chronic combined CHF -Patient was admitted to the hospital, there was evidence of fluid overload on admission, received IV Lasix with improvement in her respiratory status. Due to mild AKI, IV Lasix was held for 1 day then resumed po, with improvement in her renal function. Cardiology consulted and followed patient while hospitalized as well. 2D echo showed LVEF 96-22%, grade 2 diastolic dysfunction.  Active Problems AKI -due to IV lasix, stable and improving on dc.  Acute on chronic bronchitis - worsening in the past few  days, possibly viral in nature. No evidence on pneumonia. Started on Doxycycline, short course of prednisone and symptomatic antitussives.  Permanent A. Fib -Rate control with Toprol, continue Eliquis CAD -No chest pain  Sepsis ruled out   Discharge Instructions   Allergies as of 08/06/2020      Reactions   Keflex [cephalexin] Other (See Comments)   Pt cant remember exact reaction   Macrodantin [nitrofurantoin Macrocrystal] Other (See Comments)      Medication List    TAKE these medications   allopurinol 100 MG tablet Commonly known as: ZYLOPRIM Take 100 mg by mouth daily.   atorvastatin 40 MG tablet Commonly known as: LIPITOR Take 40 mg by mouth daily.   azelastine 0.1 % nasal spray Commonly known as: ASTELIN Place 1 spray into both nostrils daily.   benzonatate 200 MG capsule Commonly known as: TESSALON Take 1 capsule (200 mg total) by mouth 3 (three) times daily as needed for cough.   calcium carbonate 1500 (600 Ca) MG Tabs tablet Commonly known as: OSCAL Take 1 tablet by mouth in the morning and at bedtime.   Cholecalciferol 50 MCG (2000 UT) Caps Take 2,000 Units by mouth daily.   diltiazem 120 MG 24 hr capsule Commonly known as: CARDIZEM CD Take 120 mg by mouth daily.   doxycycline 100 MG tablet Commonly known as: VIBRA-TABS Take 1 tablet (100 mg total) by mouth every 12 (twelve) hours for 4 days.   Eliquis 2.5 MG Tabs tablet Generic drug: apixaban Take 2.5 mg by mouth 2 (two) times daily.   furosemide 20 MG tablet Commonly known as: Lasix Take 2 tablets (40 mg total)  by mouth daily. What changed: how much to take   guaiFENesin-dextromethorphan 100-10 MG/5ML syrup Commonly known as: ROBITUSSIN DM Take 5 mLs by mouth every 4 (four) hours as needed for cough.   losartan 100 MG tablet Commonly known as: COZAAR Take 1 tablet (100 mg total) by mouth daily.   metoprolol 200 MG 24 hr tablet Commonly known as: TOPROL-XL Take 1 tablet (200 mg total) by  mouth daily. Take with or immediately following a meal.   omeprazole 40 MG capsule Commonly known as: PRILOSEC Take 40 mg by mouth daily.   oxybutynin 5 MG tablet Commonly known as: DITROPAN Take 5 mg by mouth at bedtime.   potassium chloride 10 MEQ tablet Commonly known as: KLOR-CON Take 1 tablet (10 mEq total) by mouth daily.   predniSONE 20 MG tablet Commonly known as: DELTASONE Take 2 tablets (40 mg total) by mouth daily with breakfast for 4 days. Start taking on: August 07, 2020        Consultations:  Cardiology   Procedures/Studies:  DG Chest 2 View  Result Date: 08/03/2020 CLINICAL DATA:  Orthopnea for 3 months. Cough and worsening shortness of breath. EXAM: CHEST - 2 VIEW COMPARISON:  02/16/2020 FINDINGS: Cardiomegaly again noted. Increased diffuse interstitial infiltrates are suspicious for interstitial edema. Increased bibasilar atelectasis is seen as well as tiny bilateral pleural effusions. No evidence of pulmonary consolidation. IMPRESSION: Increased diffuse interstitial infiltrates, suspicious for interstitial edema. Increased bibasilar atelectasis and tiny bilateral pleural effusions. Stable cardiomegaly. Electronically Signed   By: Marlaine Hind M.D.   On: 08/03/2020 10:55   ECHOCARDIOGRAM COMPLETE  Result Date: 08/04/2020    ECHOCARDIOGRAM REPORT   Patient Name:   Allison Novak Date of Exam: 08/03/2020 Medical Rec #:  [id number]       Height:       63.0 in Accession #:    [id number]      Weight:       139.2 lb Date of Birth:  [birth year]       BSA:          1.658 m Patient Age:    74 years        BP:           118/67 mmHg Patient Gender: F               HR:           97 bpm. Exam Location:  [facility] Procedure: 2D Echo, Cardiac Doppler and Color Doppler Indications:     Dyspnea R06.00  History:         Patient has prior history of Echocardiogram examinations. CHF;                  Risk Factors:Hypertension.  Sonographer:     Alyse Low Roar Referring Phys:  [id number] AMY N COX  Diagnosing Phys: Serafina Royals MD IMPRESSIONS  1. Left ventricular ejection fraction, by estimation, is 45 to 50%. The left ventricle has mildly decreased function. The left ventricle has no regional wall motion abnormalities. Left ventricular diastolic parameters are consistent with Grade II diastolic dysfunction (pseudonormalization).  2. Right ventricular systolic function is normal. The right ventricular size is normal.  3. Left atrial size was moderately dilated.  4. Right atrial size was moderately dilated.  5. The mitral valve is normal in structure. Moderate mitral valve regurgitation.  6. Tricuspid valve regurgitation is moderate.  7. The aortic valve is normal in structure. Aortic valve regurgitation is trivial. FINDINGS  Left Ventricle: Left ventricular ejection fraction, by estimation, is 45 to 50%. The left ventricle has mildly decreased function. The left ventricle has no regional wall motion abnormalities. The left ventricular internal cavity size was normal in size. There is no left ventricular hypertrophy. Left ventricular diastolic parameters are consistent with Grade II diastolic dysfunction (pseudonormalization). Right Ventricle: The right ventricular size is normal. No increase in right ventricular wall thickness. Right ventricular systolic function is normal. Left Atrium: Left atrial size was moderately dilated. Right Atrium: Right atrial size was moderately dilated. Pericardium: There is no evidence of pericardial effusion. Mitral Valve: The mitral valve is normal in structure. Moderate mitral valve regurgitation. Tricuspid Valve: The tricuspid valve is normal in structure. Tricuspid valve regurgitation is moderate. Aortic Valve: The aortic valve is normal in structure. Aortic valve regurgitation is trivial. Aortic valve peak gradient measures 5.2 mmHg. Pulmonic Valve: The pulmonic valve was normal in structure. Pulmonic valve regurgitation is not visualized. Aorta: The aortic root and  ascending aorta are structurally normal, with no evidence of dilitation. IAS/Shunts: No atrial level shunt detected by color flow Doppler.  LEFT VENTRICLE PLAX 2D LVIDd:         3.94 cm  Diastology LVIDs:         2.92 cm  LV e' medial:    6.64 cm/s LV PW:         0.89 cm  LV E/e' medial:  17.0 LV IVS:        0.93 cm  LV e' lateral:   8.16 cm/s LVOT diam:     1.70 cm  LV E/e' lateral: 13.8 LVOT Area:     2.27 cm  RIGHT VENTRICLE RV Mid diam:    2.93 cm RV S prime:     12.20 cm/s TAPSE (M-mode): 1.3 cm LEFT ATRIUM             Index       RIGHT ATRIUM           Index LA diam:        3.90 cm 2.35 cm/m  RA Area:     22.10 cm LA Vol (A2C):   70.4 ml 42.47 ml/m RA Volume:   66.50 ml  40.12 ml/m LA Vol (A4C):   63.3 ml 38.19 ml/m LA Biplane Vol: 70.2 ml 42.35 ml/m  AORTIC VALVE                PULMONIC VALVE AV Area (Vmax): 1.81 cm    PV Vmax:        0.90 m/s AV Vmax:        114.00 cm/s PV Peak grad:   3.2 mmHg AV Peak Grad:   5.2 mmHg    RVOT Peak grad: 1 mmHg LVOT Vmax:      90.80 cm/s  AORTA Ao Root diam: 2.60 cm MITRAL VALVE                TRICUSPID VALVE MV Area (PHT): 4.96 cm     TR Peak grad:   43.8 mmHg MV Decel Time: 153 msec     TR Vmax:        331.00 cm/s MV E velocity: 113.00 cm/s                             SHUNTS  Systemic Diam: 1.70 cm Serafina Royals MD Electronically signed by Serafina Royals MD Signature Date/Time: 08/04/2020/8:23:13 AM    Final       Subjective: - no chest pain, shortness of breath, no abdominal pain, nausea or vomiting. Still coughing   Discharge Exam: BP 97/68 (BP Location: Right Arm)   Pulse 99   Temp (!) 97.5 F (36.4 C) (Oral)   Resp 18   Ht 5\' 1"  (1.549 m)   Wt 62.5 kg   SpO2 91%   BMI 26.04 kg/m   General: Pt is alert, awake, not in acute distress Cardiovascular: RRR, S1/S2 +, no rubs, no gallops Respiratory: CTA bilaterally, no wheezing, no rhonchi Abdominal: Soft, NT, ND, bowel sounds + Extremities: no edema, no  cyanosis  The results of significant diagnostics from this hospitalization (including imaging, microbiology, ancillary and laboratory) are listed below for reference.     Microbiology: Recent Results (from the past 240 hour(s))  SARS CORONAVIRUS 2 (TAT 6-24 HRS) Nasopharyngeal Nasopharyngeal Swab     Status: None   Collection Time: 08/03/20  2:15 PM   Specimen: Nasopharyngeal Swab  Result Value Ref Range Status   SARS Coronavirus 2 NEGATIVE NEGATIVE Final    Comment: (NOTE) SARS-CoV-2 target nucleic acids are NOT DETECTED.  The SARS-CoV-2 RNA is generally detectable in upper and lower respiratory specimens during the acute phase of infection. Negative results do not preclude SARS-CoV-2 infection, do not rule out co-infections with other pathogens, and should not be used as the sole basis for treatment or other patient management decisions. Negative results must be combined with clinical observations, patient history, and epidemiological information. The expected result is Negative.  Fact Sheet for Patients: SugarRoll.be  Fact Sheet for Healthcare Providers: https://www.woods-mathews.com/  This test is not yet approved or cleared by the [place] FDA and  has been authorized for detection and/or diagnosis of SARS-CoV-2 by FDA under an Emergency Use Authorization (EUA). This EUA will remain  in effect (meaning this test can be used) for the duration of the COVID-19 declaration under Se ction 564(b)(1) of the Act, 21 U.S.C. section 360bbb-3(b)(1), unless the authorization is terminated or revoked sooner.  Performed at [facility], [place] [address] [place], [place] [address]      Labs: Basic Metabolic Panel: Recent Labs  Lab 08/03/20 1032 08/04/20 0419 08/05/20 0417 08/06/20 0459  NA 135 133* 132* 132*  K 3.8 3.4* 4.4 3.8  CL 99 96* 96* 97*  CO2 24 29 26 26   GLUCOSE 119* 98 121* 102*  BUN 15 15 26* 31*  CREATININE  0.94 1.07* 1.35* 1.17*  CALCIUM 9.3 8.3* 8.5* 8.7*  MG  --  1.8 1.7  --   PHOS  --   --  3.8  --    Liver Function Tests: No results for input(s): AST, ALT, ALKPHOS, BILITOT, PROT, ALBUMIN in the last 168 hours. CBC: Recent Labs  Lab 08/03/20 1032 08/04/20 0419 08/05/20 0417 08/06/20 0459  WBC 14.5* 11.2* 10.2 10.3  NEUTROABS  --  8.1*  --   --   HGB 13.2 10.9* 12.0 11.3*  HCT 40.8 34.0* 36.7 34.2*  MCV 89.5 89.7 88.9 88.4  PLT 209 166 180 219   CBG: No results for input(s): GLUCAP in the last 168 hours. Hgb A1c No results for input(s): HGBA1C in the last 72 hours. Lipid Profile No results for input(s): CHOL, HDL, LDLCALC, TRIG, CHOLHDL, LDLDIRECT in the last 72 hours. Thyroid function studies No results for input(s):  TSH, T4TOTAL, T3FREE, THYROIDAB in the last 72 hours.  Invalid input(s): FREET3 Urinalysis    Component Value Date/Time   COLORURINE COLORLESS (A) 02/18/2017 1318   APPEARANCEUR CLEAR (A) 02/18/2017 1318   LABSPEC 1.002 (L) 02/18/2017 1318   PHURINE 7.0 02/18/2017 1318   GLUCOSEU NEGATIVE 02/18/2017 1318   HGBUR NEGATIVE 02/18/2017 1318   BILIRUBINUR NEGATIVE 02/18/2017 1318   KETONESUR NEGATIVE 02/18/2017 1318   PROTEINUR NEGATIVE 02/18/2017 1318   NITRITE NEGATIVE 02/18/2017 1318   LEUKOCYTESUR NEGATIVE 02/18/2017 1318    FURTHER DISCHARGE INSTRUCTIONS:   Get Medicines reviewed and adjusted: Please take all your medications with you for your next visit with your Primary MD   Laboratory/radiological data: Please request your Primary MD to go over all hospital tests and procedure/radiological results at the follow up, please ask your Primary MD to get all Hospital records sent to his/her office.   In some cases, they will be blood work, cultures and biopsy results pending at the time of your discharge. Please request that your primary care M.D. goes through all the records of your hospital data and follows up on these results.   Also Note the  following: If you experience worsening of your admission symptoms, develop shortness of breath, life threatening emergency, suicidal or homicidal thoughts you must seek medical attention immediately by calling 911 or calling your MD immediately  if symptoms less severe.   You must read complete instructions/literature along with all the possible adverse reactions/side effects for all the Medicines you take and that have been prescribed to you. Take any new Medicines after you have completely understood and accpet all the possible adverse reactions/side effects.    Do not drive when taking Pain medications or sleeping medications (Benzodaizepines)   Do not take more than prescribed Pain, Sleep and Anxiety Medications. It is not advisable to combine anxiety,sleep and pain medications without talking with your primary care practitioner   Special Instructions: If you have smoked or chewed Tobacco  in the last 2 yrs please stop smoking, stop any regular Alcohol  and or any Recreational drug use.   Wear Seat belts while driving.   Please note: You were cared for by a hospitalist during your hospital stay. Once you are discharged, your primary care physician will handle any further medical issues. Please note that NO REFILLS for any discharge medications will be authorized once you are discharged, as it is imperative that you return to your primary care physician (or establish a relationship with a primary care physician if you do not have one) for your post hospital discharge needs so that they can reassess your need for medications and monitor your lab values.  Time coordinating discharge: 35 minutes  SIGNED:  Marzetta Board, MD, PhD 08/06/2020, 12:22 PM

## 2020-08-06 NOTE — Progress Notes (Signed)
Patient discharged per orders. PIV and tele removed from patient. AVS reviewed with patient and son; expressed understanding. Patient taken down by wheelchair via volunteer service.

## 2020-08-06 NOTE — Progress Notes (Signed)
Physical Therapy Treatment Patient Details Name: Allison Novak MRN: [id number] DOB: 1925/07/04 Today's Date: 08/06/2020    History of Present Illness 87 y.o. female with medical history significant for heart failure reduced ejection fraction, hypertension, hyperlipidemia, history of atrial fibrillation on Eliquis, history of TIA, coronary artery disease and aortic atherosclerosis, carotid artery disease, presents to the emergency department for chief concerns of worsening shortness of breath.    PT Comments    Patient received in bed, continues to cough al lot during session. Patient has decreased balance this session in sitting and initial standing/walking. Required min assist to prevent posterior leaning. Patient is mod independent with bed mobility, transfers with min guard, heavy leaning on bed with legs to maintain balance. Patient ambulated 175 feet with RW and min guard, then 175 feet with SPC and min guard. Improved balance as session progressed. Patient will continue to benefit from skilled PT while here to improve functional independence, safety.  Patient may benefit from son staying with her upon initial discharge.          Follow Up Recommendations  Home health PT     Equipment Recommendations  None recommended by PT    Recommendations for Other Services       Precautions / Restrictions Precautions Precautions: Fall Restrictions Weight Bearing Restrictions: No    Mobility  Bed Mobility Overal bed mobility: Modified Independent Bed Mobility: Supine to Sit     Supine to sit: Min guard     General bed mobility comments: min cuing for technique. Min guard for sitting balance. Posterior lean    Transfers Overall transfer level: Needs assistance Equipment used: None Transfers: Sit to/from Stand Sit to Stand: Min guard         General transfer comment: Patient demonstrating posterior lean with standing this am. Difficulty achieving balance. Required min assist  to prevent LOB.  Ambulation/Gait Ambulation/Gait assistance: Min guard Gait Distance (Feet): 350 Feet Assistive device: Rolling walker (2 wheeled);Straight cane Gait Pattern/deviations: Step-through pattern;Decreased step length - right;Decreased step length - left;Narrow base of support Gait velocity: decr   General Gait Details: Ambulated 1 lap around with RW, then 1 lap around with SPC. Balance improved with increased distance. Min guard.   Stairs             Wheelchair Mobility    Modified Rankin (Stroke Patients Only)       Balance Overall balance assessment: Needs assistance Sitting-balance support: Feet supported Sitting balance-Leahy Scale: Fair     Standing balance support: Bilateral upper extremity supported;During functional activity Standing balance-Leahy Scale: Fair Standing balance comment: Poor balance initially, improved with time                            Cognition Arousal/Alertness: Awake/alert Behavior During Therapy: WFL for tasks assessed/performed Overall Cognitive Status: Within Functional Limits for tasks assessed                                        Exercises      General Comments        Pertinent Vitals/Pain Pain Assessment: No/denies pain    Home Living                      Prior Function            PT Goals (current  goals can now be found in the care plan section) Acute Rehab PT Goals Patient Stated Goal: to go home PT Goal Formulation: With patient Time For Goal Achievement: 08/18/20 Potential to Achieve Goals: Good Progress towards PT goals: Progressing toward goals    Frequency    Min 2X/week      PT Plan Current plan remains appropriate    Co-evaluation              AM-PAC PT "6 Clicks" Mobility   Outcome Measure  Help needed turning from your back to your side while in a flat bed without using bedrails?: A Little Help needed moving from lying on your back  to sitting on the side of a flat bed without using bedrails?: A Little Help needed moving to and from a bed to a chair (including a wheelchair)?: A Little Help needed standing up from a chair using your arms (e.g., wheelchair or bedside chair)?: A Little Help needed to walk in hospital room?: A Little Help needed climbing 3-5 steps with a railing? : A Little 6 Click Score: 18    End of Session Equipment Utilized During Treatment: Gait belt Activity Tolerance: Patient tolerated treatment well Patient left: in bed;with call bell/phone within reach;with bed alarm set Nurse Communication: Mobility status PT Visit Diagnosis: Muscle weakness (generalized) (M62.81);Difficulty in walking, not elsewhere classified (R26.2)     Time: 1000-1025 PT Time Calculation (min) (ACUTE ONLY): 25 min  Charges:  $Gait Training: 23-37 mins                     [employer], PT, GCS 08/06/20,11:31 AM

## 2020-09-01 ENCOUNTER — Ambulatory Visit: Payer: Medicare Other | Admitting: Family

## 2021-04-12 ENCOUNTER — Other Ambulatory Visit
Admission: RE | Admit: 2021-04-12 | Discharge: 2021-04-12 | Disposition: A | Discharge status: Home / Self Care | Payer: Medicare Other | Source: Ambulatory Visit | Attending: Cardiology | Admitting: Cardiology

## 2021-04-12 DIAGNOSIS — I10 Essential (primary) hypertension: Secondary | ICD-10-CM | POA: Insufficient documentation

## 2021-04-12 DIAGNOSIS — I480 Paroxysmal atrial fibrillation: Secondary | ICD-10-CM | POA: Insufficient documentation

## 2021-04-12 DIAGNOSIS — R0602 Shortness of breath: Secondary | ICD-10-CM | POA: Insufficient documentation

## 2021-04-12 DIAGNOSIS — I5022 Chronic systolic (congestive) heart failure: Secondary | ICD-10-CM | POA: Insufficient documentation

## 2021-04-12 LAB — BRAIN NATRIURETIC PEPTIDE: B Natriuretic Peptide: 571.8 pg/mL — ABNORMAL HIGH (ref 0.0–100.0)

## 2021-05-14 ENCOUNTER — Other Ambulatory Visit
Admission: RE | Admit: 2021-05-14 | Discharge: 2021-05-14 | Disposition: A | Discharge status: Home / Self Care | Payer: Medicare Other | Source: Ambulatory Visit | Attending: Cardiology | Admitting: Cardiology

## 2021-05-14 DIAGNOSIS — I5031 Acute diastolic (congestive) heart failure: Secondary | ICD-10-CM | POA: Diagnosis present

## 2021-05-14 LAB — BRAIN NATRIURETIC PEPTIDE: B Natriuretic Peptide: 868.4 pg/mL — ABNORMAL HIGH (ref 0.0–100.0)

## 2021-06-07 ENCOUNTER — Other Ambulatory Visit: Payer: Self-pay

## 2021-06-07 ENCOUNTER — Inpatient Hospital Stay
Admission: EM | Admit: 2021-06-07 | Discharge: 2021-06-10 | DRG: 291 | Disposition: A | Discharge status: Home Health | Payer: Medicare Other | Attending: Obstetrics and Gynecology | Admitting: Obstetrics and Gynecology

## 2021-06-07 ENCOUNTER — Emergency Department: Payer: Medicare Other

## 2021-06-07 DIAGNOSIS — D631 Anemia in chronic kidney disease: Secondary | ICD-10-CM | POA: Diagnosis present

## 2021-06-07 DIAGNOSIS — E873 Alkalosis: Secondary | ICD-10-CM | POA: Diagnosis present

## 2021-06-07 DIAGNOSIS — I5043 Acute on chronic combined systolic (congestive) and diastolic (congestive) heart failure: Secondary | ICD-10-CM | POA: Diagnosis not present

## 2021-06-07 DIAGNOSIS — K219 Gastro-esophageal reflux disease without esophagitis: Secondary | ICD-10-CM | POA: Diagnosis present

## 2021-06-07 DIAGNOSIS — K449 Diaphragmatic hernia without obstruction or gangrene: Secondary | ICD-10-CM | POA: Diagnosis present

## 2021-06-07 DIAGNOSIS — I130 Hypertensive heart and chronic kidney disease with heart failure and stage 1 through stage 4 chronic kidney disease, or unspecified chronic kidney disease: Secondary | ICD-10-CM | POA: Diagnosis not present

## 2021-06-07 DIAGNOSIS — M810 Age-related osteoporosis without current pathological fracture: Secondary | ICD-10-CM | POA: Diagnosis present

## 2021-06-07 DIAGNOSIS — Z79899 Other long term (current) drug therapy: Secondary | ICD-10-CM

## 2021-06-07 DIAGNOSIS — E871 Hypo-osmolality and hyponatremia: Secondary | ICD-10-CM | POA: Diagnosis not present

## 2021-06-07 DIAGNOSIS — Z20822 Contact with and (suspected) exposure to covid-19: Secondary | ICD-10-CM | POA: Diagnosis present

## 2021-06-07 DIAGNOSIS — Z90710 Acquired absence of both cervix and uterus: Secondary | ICD-10-CM

## 2021-06-07 DIAGNOSIS — I2510 Atherosclerotic heart disease of native coronary artery without angina pectoris: Secondary | ICD-10-CM | POA: Diagnosis present

## 2021-06-07 DIAGNOSIS — E162 Hypoglycemia, unspecified: Secondary | ICD-10-CM | POA: Diagnosis present

## 2021-06-07 DIAGNOSIS — D638 Anemia in other chronic diseases classified elsewhere: Secondary | ICD-10-CM

## 2021-06-07 DIAGNOSIS — I4821 Permanent atrial fibrillation: Secondary | ICD-10-CM

## 2021-06-07 DIAGNOSIS — I509 Heart failure, unspecified: Secondary | ICD-10-CM

## 2021-06-07 DIAGNOSIS — Z8673 Personal history of transient ischemic attack (TIA), and cerebral infarction without residual deficits: Secondary | ICD-10-CM

## 2021-06-07 DIAGNOSIS — Z8249 Family history of ischemic heart disease and other diseases of the circulatory system: Secondary | ICD-10-CM

## 2021-06-07 DIAGNOSIS — Z7901 Long term (current) use of anticoagulants: Secondary | ICD-10-CM

## 2021-06-07 DIAGNOSIS — M109 Gout, unspecified: Secondary | ICD-10-CM | POA: Diagnosis present

## 2021-06-07 DIAGNOSIS — G459 Transient cerebral ischemic attack, unspecified: Secondary | ICD-10-CM | POA: Diagnosis present

## 2021-06-07 DIAGNOSIS — E785 Hyperlipidemia, unspecified: Secondary | ICD-10-CM | POA: Diagnosis present

## 2021-06-07 DIAGNOSIS — E7801 Familial hypercholesterolemia: Secondary | ICD-10-CM | POA: Diagnosis present

## 2021-06-07 DIAGNOSIS — I272 Pulmonary hypertension, unspecified: Secondary | ICD-10-CM | POA: Diagnosis present

## 2021-06-07 DIAGNOSIS — N1831 Chronic kidney disease, stage 3a: Secondary | ICD-10-CM | POA: Diagnosis present

## 2021-06-07 DIAGNOSIS — I10 Essential (primary) hypertension: Secondary | ICD-10-CM | POA: Diagnosis present

## 2021-06-07 LAB — IRON AND TIBC
Iron: 26 ug/dL — ABNORMAL LOW (ref 28–170)
Saturation Ratios: 5 % — ABNORMAL LOW (ref 10.4–31.8)
TIBC: 501 ug/dL — ABNORMAL HIGH (ref 250–450)
UIBC: 475 ug/dL

## 2021-06-07 LAB — CBC
HCT: 28.8 % — ABNORMAL LOW (ref 36.0–46.0)
Hemoglobin: 8.6 g/dL — ABNORMAL LOW (ref 12.0–15.0)
MCH: 24 pg — ABNORMAL LOW (ref 26.0–34.0)
MCHC: 29.9 g/dL — ABNORMAL LOW (ref 30.0–36.0)
MCV: 80.4 fL (ref 80.0–100.0)
Platelets: 124 10*3/uL — ABNORMAL LOW (ref 150–400)
RBC: 3.58 MIL/uL — ABNORMAL LOW (ref 3.87–5.11)
RDW: 17.5 % — ABNORMAL HIGH (ref 11.5–15.5)
WBC: 5.5 10*3/uL (ref 4.0–10.5)
nRBC: 0 % (ref 0.0–0.2)

## 2021-06-07 LAB — FOLATE: Folate: 18.6 ng/mL (ref >5.9)

## 2021-06-07 LAB — TROPONIN I (HIGH SENSITIVITY)
Troponin I (High Sensitivity): 4 ng/L (ref <18)
Troponin I (High Sensitivity): 4 ng/L (ref <18)

## 2021-06-07 LAB — BASIC METABOLIC PANEL
Anion gap: 8 (ref 5–15)
BUN: 27 mg/dL — ABNORMAL HIGH (ref 8–23)
CO2: 30 mmol/L (ref 22–32)
Calcium: 9 mg/dL (ref 8.9–10.3)
Chloride: 89 mmol/L — ABNORMAL LOW (ref 98–111)
Creatinine, Ser: 1.14 mg/dL — ABNORMAL HIGH (ref 0.44–1.00)
GFR, Estimated: 45 mL/min — ABNORMAL LOW (ref >60)
Glucose, Bld: 74 mg/dL (ref 70–99)
Potassium: 4.2 mmol/L (ref 3.5–5.1)
Sodium: 127 mmol/L — ABNORMAL LOW (ref 135–145)

## 2021-06-07 LAB — RESP PANEL BY RT-PCR (FLU A&B, COVID) ARPGX2
Influenza A by PCR: NEGATIVE
Influenza B by PCR: NEGATIVE
SARS Coronavirus 2 by RT PCR: NEGATIVE

## 2021-06-07 LAB — FERRITIN: Ferritin: 19 ng/mL (ref 11–307)

## 2021-06-07 LAB — BRAIN NATRIURETIC PEPTIDE: B Natriuretic Peptide: 585.5 pg/mL — ABNORMAL HIGH (ref 0.0–100.0)

## 2021-06-07 MED ORDER — APIXABAN 2.5 MG PO TABS
2.5000 mg | ORAL_TABLET | Freq: Two times a day (BID) | ORAL | Status: DC
  Administered 2021-06-07 – 2021-06-10 (×6): 2.5 mg via ORAL
  Filled 2021-06-07 (×7): qty 1

## 2021-06-07 MED ORDER — ATORVASTATIN CALCIUM 20 MG PO TABS
40.0000 mg | ORAL_TABLET | Freq: Every day | ORAL | Status: DC
  Administered 2021-06-07 – 2021-06-09 (×3): 40 mg via ORAL
  Filled 2021-06-07 (×3): qty 2

## 2021-06-07 MED ORDER — ONDANSETRON HCL 4 MG/2ML IJ SOLN: 4.0000 mg | Freq: Four times a day (QID) | INTRAMUSCULAR | Status: DC | PRN

## 2021-06-07 MED ORDER — FUROSEMIDE 10 MG/ML IJ SOLN
40.0000 mg | Freq: Two times a day (BID) | INTRAMUSCULAR | Status: DC
  Administered 2021-06-08 – 2021-06-09 (×4): 40 mg via INTRAVENOUS
  Filled 2021-06-07 (×4): qty 4

## 2021-06-07 MED ORDER — SENNOSIDES-DOCUSATE SODIUM 8.6-50 MG PO TABS: 1.0000 | ORAL_TABLET | Freq: Every evening | ORAL | Status: DC | PRN

## 2021-06-07 MED ORDER — LOSARTAN POTASSIUM 50 MG PO TABS: 50.0000 mg | ORAL_TABLET | Freq: Every day | ORAL | Status: DC

## 2021-06-07 MED ORDER — FUROSEMIDE 10 MG/ML IJ SOLN
60.0000 mg | Freq: Once | INTRAMUSCULAR | Status: AC
  Administered 2021-06-07: 60 mg via INTRAVENOUS
  Filled 2021-06-07: qty 8

## 2021-06-07 MED ORDER — ONDANSETRON HCL 4 MG PO TABS: 4.0000 mg | ORAL_TABLET | Freq: Four times a day (QID) | ORAL | Status: DC | PRN

## 2021-06-07 MED ORDER — ACETAMINOPHEN 650 MG RE SUPP: 650.0000 mg | Freq: Four times a day (QID) | RECTAL | Status: DC | PRN

## 2021-06-07 MED ORDER — FUROSEMIDE 40 MG PO TABS: 40.0000 mg | ORAL_TABLET | Freq: Two times a day (BID) | ORAL | Status: DC

## 2021-06-07 MED ORDER — PANTOPRAZOLE SODIUM 40 MG PO TBEC
40.0000 mg | DELAYED_RELEASE_TABLET | Freq: Every day | ORAL | Status: DC
  Administered 2021-06-07 – 2021-06-10 (×4): 40 mg via ORAL
  Filled 2021-06-07 (×4): qty 1

## 2021-06-07 MED ORDER — ACETAMINOPHEN 325 MG PO TABS
650.0000 mg | ORAL_TABLET | Freq: Four times a day (QID) | ORAL | Status: DC | PRN
  Administered 2021-06-09 – 2021-06-10 (×2): 650 mg via ORAL
  Filled 2021-06-07 (×2): qty 2

## 2021-06-07 MED ORDER — METOPROLOL SUCCINATE ER 50 MG PO TB24: 200.0000 mg | ORAL_TABLET | Freq: Every day | ORAL | Status: DC

## 2021-06-07 NOTE — ED Triage Notes (Signed)
Patient to ER via POV with complaints of shortness of breath and fatigue, states symptoms have been ongoing for several weeks. Reports she first noticed shortness of breath when laying down in bed but now is all the time, worse with exertion. Presence of dry cough.  Has noticed some increased leg swelling and possibly a several pound weight gain.

## 2021-06-07 NOTE — H&P (Signed)
History and Physical    Allison Novak OXB:[id number] DOB: Nov 03, 1925 DOA: 06/07/2021  PCP: Adin Hector, MD   Patient coming from: Home Chief Complaint  Patient presents with   Shortness of Breath  +   HPI: Allison Novak is a 88 y.o. female with medical history significant for permanent A. fib on Eliquis, chronic combined systolic and diastolic CHF last LVEF 68-34%H9 DD ( 08/03/2020) on oral Lasix, history of TIA/carotid stenosis/aortic atherosclerosis presented to the ED with complaint of shortness of breath and fatigue symptoms ongoing for several weeks.  Symptoms worse with laying down initially but now at all position and worse with exertion along with dry cough, increased leg swelling and several pound weight gain.  Patient has recently started taking increased dose of Lasix 40 in the morning and 20 in the day, recently seen by her cardiologist and Lasix was being adjusted without improvement in the symptoms.  ED Course: Hemodynamically stable, afebrile, on room air, labs showed hyponatremia 127 elevated BNP 595, normal at bedtime troponin, anemia with drop in hemoglobin 8.6 g from previous baseline 11.3 on 08/06/2020.  EKG personally reviewed, with atrial fibrillation, chest x-ray showed low lung volumes and bilateral small effusion. Patient was given IV Lasix and admission requested for further management  Assessment/Plan  Acute on chronic combined systolic and diastolic QQI:WLNL LVEF 89-21%J9 DD (08/03/20).  Given patient presentation of suspect acute exacerbation of combined CHF. Will update echo. Admit to tele, cont w/ IV diuresis.monitor daily wt stririct I/O.Cont salt and fluid restricted diet.  GADMT: Resume metoprolol, losartan.  May need cardiology to sees [facility] cardiology   HTN;bp stable resume home meds once med rec done  History of TIA/carotid stenosis/aortic atherosclerosis Hyperlipidemia: Continue on patient's anticoagulation, Lipitor  GERD/hiatal hernia: Resume  PPI  Anemia, unclear etiology likely from chronic disease in the setting of Eliquis use,history of hemorrhoids: check anemia panel, FOBT.  Permanent atrial fibrillation:on eliquis, cont amd resume metoprolol  Hyponatremia: suspect in the setting of CHF and hypervolemia expect improvement with diuresis.  Body mass index is 26.04 kg/m.   Severity of Illness: The appropriate patient status for this patient is OBSERVATION. Observation status is judged to be reasonable and necessary in order to provide the required intensity of service to ensure the patient's safety. The patient's presenting symptoms, physical exam findings, and initial radiographic and laboratory data in the context of their medical condition is felt to place them at decreased risk for further clinical deterioration. Furthermore, it is anticipated that the patient will be medically stable for discharge from the hospital within 2 midnights of admission.   DVT prophylaxis: apixaban (ELIQUIS) tablet 2.5 mg Start: 06/07/21 2200 apixaban (ELIQUIS) tablet 2.5 mg   Code Status:   Code Status: Full Code  Family Communication: Admission, patients condition and plan of care including tests being ordered have been discussed with the patient and her daughter who indicate understanding and agree with the plan and Code Status.  Consults called: none  Review of Systems: All systems were reviewed and were negative except as mentioned in HPI above. Negative for fever Negative for chest pain Negative for vomiting  Past Medical History:  Diagnosis Date   A-fib (HCC)    CHF (congestive heart failure) (HCC)    Coronary artery disease    Diverticulosis    GERD (gastroesophageal reflux disease)    Gout    Hypertension    Osteoporosis     Past Surgical History:  Procedure Laterality Date  ABDOMINAL HYSTERECTOMY     ANKLE SURGERY Left 2011   KNEE ARTHROSCOPY Right      reports that she has never smoked. She has never used smokeless  tobacco. She reports that she does not drink alcohol and does not use drugs.  Allergies  Allergen Reactions   Keflex [Cephalexin] Other (See Comments)    Pt cant remember exact reaction   Macrodantin [Nitrofurantoin Macrocrystal] Other (See Comments)    Family History  Problem Relation Age of Onset   CAD Mother    Hypertension Sister      Prior to Admission medications   Medication Sig Start Date End Date Taking? Authorizing Provider  allopurinol (ZYLOPRIM) 100 MG tablet Take 100 mg by mouth daily.    [provider]  atorvastatin (LIPITOR) 40 MG tablet Take 40 mg by mouth daily. 11/18/16   [provider]  azelastine (ASTELIN) 0.1 % nasal spray Place 1 spray into both nostrils daily. 06/15/20   [provider]  benzonatate (TESSALON) 200 MG capsule Take 1 capsule (200 mg total) by mouth 3 (three) times daily as needed for cough. 08/06/20   Caren Griffins, MD  calcium carbonate (OSCAL) 1500 (600 Ca) MG TABS tablet Take 1 tablet by mouth in the morning and at bedtime.    [provider]  Cholecalciferol 50 MCG (2000 UT) CAPS Take 2,000 Units by mouth daily.    [provider]  diltiazem (CARDIZEM CD) 120 MG 24 hr capsule Take 120 mg by mouth daily. 07/21/20   [provider]  ELIQUIS 2.5 MG TABS tablet Take 2.5 mg by mouth 2 (two) times daily. 02/13/20   [provider]  furosemide (LASIX) 20 MG tablet Take 2 tablets (40 mg total) by mouth daily. 08/06/20 09/05/20  Caren Griffins, MD  guaiFENesin-dextromethorphan (ROBITUSSIN DM) 100-10 MG/5ML syrup Take 5 mLs by mouth every 4 (four) hours as needed for cough. 08/06/20   Caren Griffins, MD  losartan (COZAAR) 100 MG tablet Take 1 tablet (100 mg total) by mouth daily. 06/24/15   Dustin Flock, MD  metoprolol succinate (TOPROL-XL) 200 MG 24 hr tablet Take 1 tablet (200 mg total) by mouth daily. Take with or immediately following a meal. 02/18/20   Lorella Nimrod, MD  omeprazole  (PRILOSEC) 40 MG capsule Take 40 mg by mouth daily.     [provider]  oxybutynin (DITROPAN) 5 MG tablet Take 5 mg by mouth at bedtime.    [provider]  potassium chloride (KLOR-CON) 10 MEQ tablet Take 1 tablet (10 mEq total) by mouth daily. 02/18/20   Lorella Nimrod, MD    Physical Exam: Vitals:   06/07/21 1311 06/07/21 1620  BP: 96/66 102/71  Pulse: 61 65  Resp: 20 17  Temp: 98.2 F (36.8 C) 98.7 F (37.1 C)  TempSrc:  Oral  SpO2: 96% 98%  Height: 5\' 1"  (1.549 m)     General exam: AAOx3, elderly, frail, pleasant not in distress, NAD, weak appearing. HEENT:Oral mucosa moist, Ear/Nose WNL grossly, dentition normal. Respiratory system: bilaterally diminished,no wheezing or crackles,no use of accessory muscle Cardiovascular system: S1 & S2 +, No JVD,. Gastrointestinal system: Abdomen soft, NT,ND, BS+ Nervous System:Alert, awake, moving extremities and grossly nonfocal Extremities: Bilateral 3+ pitting edema in the ankle/LE, distal peripheral pulses palpable.  Skin: No rashes,no icterus. MSK: Normal muscle bulk,tone, power   Labs on Admission: I have personally reviewed following labs and imaging studies  CBC: Recent Labs  Lab 06/07/21 1328  WBC  5.5  HGB 8.6*  HCT 28.8*  MCV 80.4  PLT 885*   Basic Metabolic Panel: Recent Labs  Lab 06/07/21 1328  NA 127*  K 4.2  CL 89*  CO2 30  GLUCOSE 74  BUN 27*  CREATININE 1.14*  CALCIUM 9.0   GFR: CrCl cannot be calculated (Unknown ideal weight.). Liver Function Tests: No results for input(s): AST, ALT, ALKPHOS, BILITOT, PROT, ALBUMIN in the last 168 hours. No results for input(s): LIPASE, AMYLASE in the last 168 hours. No results for input(s): AMMONIA in the last 168 hours. Coagulation Profile: No results for input(s): INR, PROTIME in the last 168 hours. Cardiac Enzymes: No results for input(s): CKTOTAL, CKMB, CKMBINDEX, TROPONINI in the last 168 hours. BNP (last 3 results) No results for  input(s): PROBNP in the last 8760 hours. HbA1C: No results for input(s): HGBA1C in the last 72 hours. CBG: No results for input(s): GLUCAP in the last 168 hours. Lipid Profile: No results for input(s): CHOL, HDL, LDLCALC, TRIG, CHOLHDL, LDLDIRECT in the last 72 hours. Thyroid Function Tests: No results for input(s): TSH, T4TOTAL, FREET4, T3FREE, THYROIDAB in the last 72 hours. Anemia Panel: No results for input(s): VITAMINB12, FOLATE, FERRITIN, TIBC, IRON, RETICCTPCT in the last 72 hours. Urine analysis:    Component Value Date/Time   COLORURINE COLORLESS (A) 02/18/2017 1318   APPEARANCEUR CLEAR (A) 02/18/2017 1318   LABSPEC 1.002 (L) 02/18/2017 1318   PHURINE 7.0 02/18/2017 1318   GLUCOSEU NEGATIVE 02/18/2017 1318   HGBUR NEGATIVE 02/18/2017 1318   BILIRUBINUR NEGATIVE 02/18/2017 [place] 02/18/2017 1318   PROTEINUR NEGATIVE 02/18/2017 1318   NITRITE NEGATIVE 02/18/2017 1318   LEUKOCYTESUR NEGATIVE 02/18/2017 1318    Radiological Exams on Admission: DG Chest 2 View  Result Date: 06/07/2021 CLINICAL DATA:  Short of breath, fatigue. EXAM: CHEST - 2 VIEW COMPARISON:  08/03/2020 FINDINGS: Low lung volumes. Bilateral small effusions. No pulmonary edema. No infiltrate. No acute osseous abnormality. IMPRESSION: Low lung volumes and bilateral small effusions Electronically Signed   By: Suzy Bouchard M.D.   On: 06/07/2021 14:06      Antonieta Pert MD [facility]  If 7PM-7AM, please contact night-coverage [email or web address]  06/07/2021, 5:30 PM

## 2021-06-07 NOTE — ED Provider Notes (Signed)
[facility] Provider Note    Event Date/Time   First MD Initiated Contact with Patient 06/07/21 1550     (approximate)   History   Shortness of Breath   HPI  Allison Novak is a 88 y.o. female who presents to the ED for evaluation of Shortness of Breath  I review outpatient cardiology visit from 1/6.  History of atrial fibrillation on Eliquis, CHF with EF of 45% with moderate mitral regurg.  Patient lives at home independently, typically ambulatory with a cane or walker.  She presents with her daughter today for evaluation of acute on subacute dyspnea.  She reports increased dyspnea on exertion, lower extremity swelling and orthopnea over the past 2-3 weeks.  She reports feeling somewhat improved a few weeks ago after her cardiology visit when she was told to take Lasix at an increased dose for 5 days, but has been worsening since going back to her more normal dosing.  Denies abdominal pain, emesis or diarrhea.  Denies melena or medic easier, but does report some red blood when she wipes her bottom and this is consistent with her known history of hemorrhoids.  She minimizes this blood loss, and denies any other stigmata of blood loss.  Physical Exam   Triage Vital Signs: ED Triage Vitals [06/07/21 1311]  Enc Vitals Group     BP 96/66     Pulse Rate 61     Resp 20     Temp 98.2 F (36.8 C)     Temp src      SpO2 96 %     Weight      Height 5\' 1"  (1.549 m)     Head Circumference      Peak Flow      Pain Score 0     Pain Loc      Pain Edu?      Excl. in [place]?     Most recent vital signs: Vitals:   06/07/21 1311 06/07/21 1620  BP: 96/66 102/71  Pulse: 61 65  Resp: 20 17  Temp: 98.2 F (36.8 C) 98.7 F (37.1 C)  SpO2: 96% 98%    General: Awake, no distress.  Pleasant and conversational in full sentences. CV:  Good peripheral perfusion.  Normal rate and irregular rhythm Resp:  Normal effort.  No distress Abd:  No distention.  Benign and  nontender MSK:  No deformity noted.  Pitting edema to bilateral lower extremities up to the knees. Neuro:  No focal deficits appreciated. Other:     ED Results / Procedures / Treatments   Labs (all labs ordered are listed, but only abnormal results are displayed) Labs Reviewed  BASIC METABOLIC PANEL - Abnormal; Notable for the following components:      Result Value   Sodium 127 (*)    Chloride 89 (*)    BUN 27 (*)    Creatinine, Ser 1.14 (*)    GFR, Estimated 45 (*)    All other components within normal limits  CBC - Abnormal; Notable for the following components:   RBC 3.58 (*)    Hemoglobin 8.6 (*)    HCT 28.8 (*)    MCH 24.0 (*)    MCHC 29.9 (*)    RDW 17.5 (*)    Platelets 124 (*)    All other components within normal limits  BRAIN NATRIURETIC PEPTIDE - Abnormal; Notable for the following components:   B Natriuretic Peptide 585.5 (*)    All  other components within normal limits  RESP PANEL BY RT-PCR (FLU A&B, COVID) ARPGX2  TROPONIN I (HIGH SENSITIVITY)  TROPONIN I (HIGH SENSITIVITY)    EKG  Atrial fibrillation, rate of 64 bpm.  Normal axis and intervals.  No STEMI.  RADIOLOGY 2 view CXR reviewed by me with increased pulm vascular congestion and small bilateral pleural effusions without lobar filtration or PTX.  Official radiology report(s): DG Chest 2 View  Result Date: 06/07/2021 CLINICAL DATA:  Short of breath, fatigue. EXAM: CHEST - 2 VIEW COMPARISON:  08/03/2020 FINDINGS: Low lung volumes. Bilateral small effusions. No pulmonary edema. No infiltrate. No acute osseous abnormality. IMPRESSION: Low lung volumes and bilateral small effusions Electronically Signed   By: Suzy Bouchard M.D.   On: 06/07/2021 14:06    PROCEDURES and INTERVENTIONS:  .1-3 Lead EKG Interpretation Performed by: Vladimir Crofts, MD Authorized by: Vladimir Crofts, MD     Interpretation: normal     ECG rate:  70   ECG rate assessment: normal     Rhythm: atrial fibrillation     Ectopy:  none     Conduction: normal    Medications  furosemide (LASIX) injection 60 mg (60 mg Intravenous Given 06/07/21 1653)     IMPRESSION / MDM / ASSESSMENT AND PLAN / ED COURSE  I reviewed the triage vital signs and the nursing notes.  88 year old female presents to the ED with evidence of acute on chronic CHF requiring medical observation admission.  Normal vitals on room air without hypoxia.  No distress on exam, but does have signs of volume overload.  X-ray for the suggestive of volume overload.  Blood work with mild hyponatremia and intact renal function.  Troponin is negative and BNP is elevated.  Her CBC does demonstrate a mild normocytic anemia, with a two-point hemoglobin drop from nearly 1 year ago.  She minimizes her bleeding symptoms and she has no active bleeding, or tachycardia to suggest poorly compensated anemia.  Due to her multiple acute comorbidities, I consulted with hospitalist who agrees to admit for further work-up and management.  Clinical Course as of 06/07/21 1700  Mon Jun 07, 2021  1637 Updated patient and daughter on plan of care.  We discussed admission due to small hemoglobin drop and CHF exacerbation.  They are in agreement. [DS]    Clinical Course User Index [DS] Vladimir Crofts, MD     FINAL CLINICAL IMPRESSION(S) / ED DIAGNOSES   Final diagnoses:  Acute on chronic combined systolic and diastolic congestive heart failure ([place])  Hyponatremia     Rx / DC Orders   ED Discharge Orders     None        Note:  This document was prepared using Dragon voice recognition software and may include unintentional dictation errors.   Vladimir Crofts, MD 06/07/21 [id number]

## 2021-06-08 ENCOUNTER — Inpatient Hospital Stay
Admit: 2021-06-08 | Discharge: 2021-06-08 | Disposition: A | Discharge status: Home / Self Care | Payer: Medicare Other | Attending: Internal Medicine | Admitting: Internal Medicine

## 2021-06-08 ENCOUNTER — Encounter: Payer: Self-pay | Admitting: Internal Medicine

## 2021-06-08 DIAGNOSIS — N1831 Chronic kidney disease, stage 3a: Secondary | ICD-10-CM | POA: Diagnosis present

## 2021-06-08 DIAGNOSIS — M810 Age-related osteoporosis without current pathological fracture: Secondary | ICD-10-CM | POA: Diagnosis present

## 2021-06-08 DIAGNOSIS — Z90710 Acquired absence of both cervix and uterus: Secondary | ICD-10-CM | POA: Diagnosis not present

## 2021-06-08 DIAGNOSIS — E7801 Familial hypercholesterolemia: Secondary | ICD-10-CM | POA: Diagnosis present

## 2021-06-08 DIAGNOSIS — Z20822 Contact with and (suspected) exposure to covid-19: Secondary | ICD-10-CM | POA: Diagnosis present

## 2021-06-08 DIAGNOSIS — D631 Anemia in chronic kidney disease: Secondary | ICD-10-CM | POA: Diagnosis present

## 2021-06-08 DIAGNOSIS — M109 Gout, unspecified: Secondary | ICD-10-CM | POA: Diagnosis present

## 2021-06-08 DIAGNOSIS — K219 Gastro-esophageal reflux disease without esophagitis: Secondary | ICD-10-CM | POA: Diagnosis present

## 2021-06-08 DIAGNOSIS — Z8673 Personal history of transient ischemic attack (TIA), and cerebral infarction without residual deficits: Secondary | ICD-10-CM | POA: Diagnosis not present

## 2021-06-08 DIAGNOSIS — K449 Diaphragmatic hernia without obstruction or gangrene: Secondary | ICD-10-CM | POA: Diagnosis present

## 2021-06-08 DIAGNOSIS — I272 Pulmonary hypertension, unspecified: Secondary | ICD-10-CM | POA: Diagnosis present

## 2021-06-08 DIAGNOSIS — I2510 Atherosclerotic heart disease of native coronary artery without angina pectoris: Secondary | ICD-10-CM | POA: Diagnosis present

## 2021-06-08 DIAGNOSIS — E162 Hypoglycemia, unspecified: Secondary | ICD-10-CM | POA: Diagnosis present

## 2021-06-08 DIAGNOSIS — Z7901 Long term (current) use of anticoagulants: Secondary | ICD-10-CM | POA: Diagnosis not present

## 2021-06-08 DIAGNOSIS — Z8249 Family history of ischemic heart disease and other diseases of the circulatory system: Secondary | ICD-10-CM | POA: Diagnosis not present

## 2021-06-08 DIAGNOSIS — I5043 Acute on chronic combined systolic (congestive) and diastolic (congestive) heart failure: Secondary | ICD-10-CM | POA: Diagnosis present

## 2021-06-08 DIAGNOSIS — I130 Hypertensive heart and chronic kidney disease with heart failure and stage 1 through stage 4 chronic kidney disease, or unspecified chronic kidney disease: Secondary | ICD-10-CM | POA: Diagnosis present

## 2021-06-08 DIAGNOSIS — Z79899 Other long term (current) drug therapy: Secondary | ICD-10-CM | POA: Diagnosis not present

## 2021-06-08 DIAGNOSIS — E871 Hypo-osmolality and hyponatremia: Secondary | ICD-10-CM | POA: Diagnosis present

## 2021-06-08 DIAGNOSIS — I4821 Permanent atrial fibrillation: Secondary | ICD-10-CM | POA: Diagnosis present

## 2021-06-08 DIAGNOSIS — E873 Alkalosis: Secondary | ICD-10-CM | POA: Diagnosis present

## 2021-06-08 LAB — BASIC METABOLIC PANEL
Anion gap: 11 (ref 5–15)
BUN: 25 mg/dL — ABNORMAL HIGH (ref 8–23)
CO2: 30 mmol/L (ref 22–32)
Calcium: 8.7 mg/dL — ABNORMAL LOW (ref 8.9–10.3)
Chloride: 90 mmol/L — ABNORMAL LOW (ref 98–111)
Creatinine, Ser: 1.01 mg/dL — ABNORMAL HIGH (ref 0.44–1.00)
GFR, Estimated: 52 mL/min — ABNORMAL LOW (ref >60)
Glucose, Bld: 65 mg/dL — ABNORMAL LOW (ref 70–99)
Potassium: 3.5 mmol/L (ref 3.5–5.1)
Sodium: 131 mmol/L — ABNORMAL LOW (ref 135–145)

## 2021-06-08 LAB — ECHOCARDIOGRAM COMPLETE
AR max vel: 1.34 cm2
AV Area VTI: 1.34 cm2
AV Area mean vel: 1.36 cm2
AV Mean grad: 3 mmHg
AV Peak grad: 5.7 mmHg
Ao pk vel: 1.19 m/s
Area-P 1/2: 4.08 cm2
Height: 61 in
MV VTI: 1.34 cm2
S' Lateral: 3.01 cm

## 2021-06-08 LAB — CBC
HCT: 29.2 % — ABNORMAL LOW (ref 36.0–46.0)
Hemoglobin: 9 g/dL — ABNORMAL LOW (ref 12.0–15.0)
MCH: 24.5 pg — ABNORMAL LOW (ref 26.0–34.0)
MCHC: 30.8 g/dL (ref 30.0–36.0)
MCV: 79.6 fL — ABNORMAL LOW (ref 80.0–100.0)
Platelets: 114 10*3/uL — ABNORMAL LOW (ref 150–400)
RBC: 3.67 MIL/uL — ABNORMAL LOW (ref 3.87–5.11)
RDW: 17.6 % — ABNORMAL HIGH (ref 11.5–15.5)
WBC: 4.9 10*3/uL (ref 4.0–10.5)
nRBC: 0 % (ref 0.0–0.2)

## 2021-06-08 LAB — VITAMIN B12: Vitamin B-12: 667 pg/mL (ref 180–914)

## 2021-06-08 LAB — RETICULOCYTES
Immature Retic Fract: 13.5 % (ref 2.3–15.9)
RBC.: 3.64 MIL/uL — ABNORMAL LOW (ref 3.87–5.11)
Retic Count, Absolute: 64.8 10*3/uL (ref 19.0–186.0)
Retic Ct Pct: 1.8 % (ref 0.4–3.1)

## 2021-06-08 LAB — CBG MONITORING, ED: Glucose-Capillary: 76 mg/dL (ref 70–99)

## 2021-06-08 MED ORDER — METOPROLOL SUCCINATE ER 50 MG PO TB24
50.0000 mg | ORAL_TABLET | Freq: Every day | ORAL | Status: DC
  Administered 2021-06-08 – 2021-06-09 (×2): 50 mg via ORAL
  Filled 2021-06-08 (×2): qty 1

## 2021-06-08 MED ORDER — POTASSIUM CHLORIDE CRYS ER 20 MEQ PO TBCR
40.0000 meq | EXTENDED_RELEASE_TABLET | ORAL | Status: AC
  Administered 2021-06-08 (×2): 40 meq via ORAL
  Filled 2021-06-08 (×2): qty 2

## 2021-06-08 NOTE — Consult Note (Signed)
[facility] NOTE       Patient ID: VERBENA BOEDING MRN: [id number] DOB/AGE: 86-Oct-1927 86 y.o.  Admit date: 06/07/2021 Referring Physician Dr. Antonieta Pert Primary Physician Dr. Ramonita Lab Primary Cardiologist Dr. Donnelly Angelica Reason for Consultation AoCHF  HPI: The patient is a 88 year old female with a past medical history significant for combined systolic/diastolic heart failure with LVEF 45-50%, moderate MR, persistent atrial fibrillation on dose reduced Eliquis, familial hypercholesterolemia, bilateral carotid artery disease, aortic atherosclerosis, history of TIA 2018, CKD 3 who presented to [facility] ED 06/07/2021 with a several week history of shortness of breath and fatigue.  Cardiology is consulted for assistance with diuresis  From her last office visit 05/14/2021 with Dr. Corky Sox, the patient was instructed to take Lasix 40 mg twice a day for 5 days, then go back to her normal schedule of 40 mg in the morning and 20 mg at night.  She reports compliance with this regimen and says it helped but the following week she felt somewhat fluid buildup.  Her BNP at the time was 868.  She says after that 5-day regimen she would occasionally take 40 mg twice daily did seem to help, but the last three days prior to admission she noticed the swelling building and had difficulty dressing herself and had significant fatigue, SOB, and orthopnea. Reports some substernal chest pain in the ED yesterday that was relieved with protonix. Denies recurrence of chest pain this morning.   She received IV Lasix 60 x 1 and 40 mg x 1 in the ED with 1.4 L reported output and says she feels may be a little bit better after that but not back to her baseline.  Vitals are significant for blood pressure well controlled at 115/78 heart rate between 60 and 70, SPO2 95% on room air.  Labs are significant for a BNP of 585.5 sodium increasing 127-131.  Potassium 3.5, creatinine improving 1.14-1.01, EGFR trending  45-52.  Troponins flat 4-4.  Iron panel revealed iron 26, TIBC 501.  H&H 9/29.2, platelets downtrending 124-114.  Chest x-ray revealed low lung volumes with small bilateral pleural effusions without pulmonary edema.  Review of systems complete and found to be negative unless listed above     Past Medical History:  Diagnosis Date   A-fib ([place])    CHF (congestive heart failure) ([place])    Coronary artery disease    Diverticulosis    GERD (gastroesophageal reflux disease)    Gout    Hypertension    Osteoporosis     Past Surgical History:  Procedure Laterality Date   ABDOMINAL HYSTERECTOMY     ANKLE SURGERY Left 2011   KNEE ARTHROSCOPY Right     (Not in a hospital admission)  Social History   Socioeconomic History   Marital status: Widowed    Spouse name: Not on file   Number of children: Not on file   Years of education: Not on file   Highest education level: Not on file  Occupational History   Not on file  Tobacco Use   Smoking status: Never   Smokeless tobacco: Never  Substance and Sexual Activity   Alcohol use: No    Alcohol/week: 0.0 standard drinks   Drug use: No   Sexual activity: Never  Other Topics Concern   Not on file  Social History Narrative   Not on file   Social Determinants of Health   Financial Resource Strain: Not on file  Food Insecurity: Not on file  Transportation Needs: Not on file  Physical Activity: Not on file  Stress: Not on file  Social Connections: Not on file  Intimate Partner Violence: Not on file    Family History  Problem Relation Age of Onset   CAD Mother    Hypertension Sister       Review of systems complete and found to be negative unless listed above    PHYSICAL EXAM General: Pleasant elderly caucasian female , well nourished, in no acute distress. Sitting upright eating breakfast with daughter at bedside HEENT:  Normocephalic and atraumatic. Neck:  No JVD.  Lungs: Normal respiratory effort on room air. Clear  bilaterally to auscultation. LLL crackles.  Heart: Irregular rhythm and RR . Normal S1 and S2 without gallops or murmurs. Radial & DP pulses 2+ bilaterally. Abdomen: Non-distended appearing.  Msk: Normal strength and tone for age. Extremities: Warm and well perfused. No clubbing, cyanosis. Bilateral 2+ pitting edema with some tenderness to palpation of L anterior shin Neuro: Alert and oriented X 3. Psych:  Answers questions appropriately.   Labs:   Lab Results  Component Value Date   WBC 4.9 06/08/2021   HGB 9.0 (L) 06/08/2021   HCT 29.2 (L) 06/08/2021   MCV 79.6 (L) 06/08/2021   PLT 114 (L) 06/08/2021    Recent Labs  Lab 06/08/21 0635  NA 131*  K 3.5  CL 90*  CO2 30  BUN 25*  CREATININE 1.01*  CALCIUM 8.7*  GLUCOSE 65*   Lab Results  Component Value Date   TROPONINI <0.03 02/18/2017    Lab Results  Component Value Date   CHOL 186 02/19/2017   Lab Results  Component Value Date   HDL 50 02/19/2017   Lab Results  Component Value Date   LDLCALC 112 (H) 02/19/2017   Lab Results  Component Value Date   TRIG 118 02/19/2017   Lab Results  Component Value Date   CHOLHDL 3.7 02/19/2017   No results found for: LDLDIRECT    Radiology: DG Chest 2 View  Result Date: 06/07/2021 CLINICAL DATA:  Short of breath, fatigue. EXAM: CHEST - 2 VIEW COMPARISON:  08/03/2020 FINDINGS: Low lung volumes. Bilateral small effusions. No pulmonary edema. No infiltrate. No acute osseous abnormality. IMPRESSION: Low lung volumes and bilateral small effusions Electronically Signed   By: Suzy Bouchard M.D.   On: 06/07/2021 14:06    ECHO LVEF 45-50%, moderate MR  TELEMETRY reviewed by me: Afib rate 73-78  EKG reviewed by me:   ASSESSMENT AND PLAN:  The patient is a 88 year old female with a past medical history significant for combined systolic/diastolic heart failure with LVEF 45-50%, moderate MR, persistent atrial fibrillation on dose reduced Eliquis, familial hypercholesterolemia,  bilateral carotid artery disease, aortic atherosclerosis, history of TIA 2018, CKD 3 who presented to [facility] ED 06/07/2021 with a several week history of shortness of breath and fatigue.  Cardiology is consulted for assistance with diuresis  #Acute on chronic combined systolic and diastolic heart failure (EF 45-50%) S/p total of 100 mg IV Lasix since admission with good output. Appears significantly volume overloaded with bilateral LEE. BNP 585.  -recommend IV lasix 33m BID for now and will reassess daily.  -losartan on hold and metoprolol reduced to 589mQD as her BP has been soft.  -Home GDMT includes metoprolol succinate 200 mg, losartan 50 mg and Lasix 40 mg a.m./20 mg p.m. -Recommend strict I's and O's, salt restriction, daily weights  #Persistent atrial fibrillation Currently rate controlled on metoprolol succinate 5049m  daily (home dose 200 as above) and anticoagulated with dose reduced Eliquis 2.5 mg twice daily for stroke risk reduction -CHA2DS2-VASc 6  #CKD 3 -Cr on admission 1.14 - 1.01 and GFR 45-52 with diuresis -will continue to monitor  #Familial hypercholesterolemia #hypertension -Continue high intensity atorvastatin 40 mg -other plan as above  This patient's plan of care was discussed and created with Dr. Donnelly Angelica and he is in agreement.  Signed: Tristan Schroeder , PA-C 06/08/2021, 8:53 AM [facility] [place] 7a-4p M-F.

## 2021-06-08 NOTE — Progress Notes (Signed)
*  PRELIMINARY RESULTS* Echocardiogram 2D Echocardiogram has been performed.  Allison Novak 06/08/2021, 12:11 PM

## 2021-06-08 NOTE — Progress Notes (Signed)
Patient with glucose 65 on morning labs. CBG obtained per MD order with result 76. Orange juice provided, patient encouraged to keep eating.

## 2021-06-08 NOTE — Progress Notes (Signed)
PROGRESS NOTE    Allison Novak  XBD:[id number] DOB: 01/17/1926 DOA: 06/07/2021 PCP: Adin Hector, MD   Chief Complaint  Patient presents with   Shortness of Breath  Brief Narrative/Hospital Course: Allison Novak, 88 y.o. female with PMH of for permanent A. fib on Eliquis, chronic combined systolic and diastolic CHF last LVEF 83-41%D6 DD ( 08/03/2020) on oral Lasix, history of TIA/carotid stenosis/aortic atherosclerosis presented to the ED with complaint of shortness of breath and fatigue symptoms ongoing for several weeks.  Symptoms worse with laying down initially but now at all position and worse with exertion along with dry cough, increased leg swelling and several pound weight gain.  Patient has recently started taking increased dose of Lasix 40 in the morning and 20 in the day, recently seen by her cardiologist and Lasix was being adjusted without improvement in the symptoms.   ED Course: Hemodynamically stable, afebrile, on room air, labs showed hyponatremia 127 elevated BNP 595, normal at bedtime troponin, anemia with drop in hemoglobin 8.6 g from previous baseline 11.3 on 08/06/2020.  EKG personally reviewed, with atrial fibrillation, chest x-ray showed low lung volumes and bilateral small effusion. Patient was given IV Lasix and admission requested for further management   Subjective: Feels some better Eating  Daughter at bedside Overnight blood pressure stable 100-115s, saturating well at 95% Labs shows improving creatinine and hemoglobin Last wt 137 in her doctor office - normally weigh around 130lb.  Assessment & Plan:  Acute on chronic combined systolic and diastolic QIW:LNLG LVEF 92-11%H4 DD (08/03/20).  Given patient presentation of suspect acute exacerbation of combined CHF.  Repeat echocardiogram pending, continue with IV diuresis.monitor daily wt stririct I/O.Cont salt and fluid restricted diet.  GDMT: Metoprolol will be reduced to low-dose and hold off losartan to  allow room for diuresis.  Cardiology has been consulted await further recommendation.Requested daily weight. Net IO Since Admission: -1,400 mL [06/08/21 1036]  There were no vitals filed for this visit.    HTN: BP soft losartan discontinued metoprolol dose decreased.  Continue diuresis.     History of TIA/carotid stenosis/aortic atherosclerosis Hyperlipidemia: Stable.  Continue continue on patient's anticoagulation, Lipitor   GERD/hiatal hernia: cont PPI  Hypoglycemia: Low blood sugar 65 with hypoglycemia on blood sugar check this morning.  Encourage p.o. Recent Labs  Lab 06/08/21 0841  GLUCAP 76    Anemia, unclear etiology likely from chronic disease in the setting of Eliquis use,history of hemorrhoids: check anemia panel, FOBT.  Hemoglobin appears to be stable improving.  Anemia panel with iron 26 ferritin normal Recent Labs  Lab 06/07/21 1328 06/08/21 0635  HGB 8.6* 9.0*  HCT 28.8* 29.2*     Permanent atrial fibrillation:on eliquis, continue her metoprolol.  Rate is controlled    Hyponatremia: suspect in the setting of CHF and hypervolemia expect improvement with diuresis. Recent Labs  Lab 06/07/21 1328 06/08/21 0635  NA 127* 131*    DVT prophylaxis: apixaban (ELIQUIS) tablet 2.5 mg Start: 06/07/21 2200 Code Status:   Code Status: Full Code Family Communication: plan of care discussed with patient and daughter at bedside. Status is: admitted as Observation Remains inpatient will need at least 2 midnight stay for ongoing IV diuresis we will change to inpatient Planned Discharge Destination: from home. Lives independently, plan for  Home with Home Health Disposition: Currently not medically stable for discharge. Anticipated Disposition: 2-3 days  Total time spent in the care of this patient 50 MINUTES Objective: Vitals last 24 hrs: Vitals:  06/08/21 0043 06/08/21 0400 06/08/21 0600 06/08/21 0700  BP: (!) 113/59 (!) 101/44 (!) 101/57 115/78  Pulse: 69 63 75 70   Resp: 16 12 15 12   Temp:      TempSrc:      SpO2: 91% 92% 92% 95%  Height:       Weight change:   Intake/Output Summary (Last 24 hours) at 06/08/2021 1036 Last data filed at 06/08/2021 0304 Gross per 24 hour  Intake --  Output 1400 ml  Net -1400 ml   Net IO Since Admission: -1,400 mL [06/08/21 1036]   Physical Examination: General exam: Aaox3, comfortable pleasant, weak,older than stated age. HEENT:Oral mucosa moist, Ear/Nose WNL grossly,dentition normal. Respiratory system: B/l diminished BS, no use of accessory muscle, non tender. Cardiovascular system: S1 & S2 +,No JVD. Gastrointestinal system: Abdomen soft, NT,ND, BS+. Nervous System:Alert, awake, moving extremities. Extremities: edema 3+ b/l LE, distal peripheral pulses palpable.  Skin: No rashes, no icterus. MSK: Normal muscle bulk, tone, power.  Medications reviewed:  Scheduled Meds:  apixaban  2.5 mg Oral BID   atorvastatin  40 mg Oral QHS   furosemide  40 mg Intravenous BID   metoprolol succinate  50 mg Oral Daily   pantoprazole  40 mg Oral Daily   Continuous Infusions:  Diet Order             Diet Heart Room service appropriate? Yes; Fluid consistency: Thin; Fluid restriction: 1200 mL Fluid  Diet effective now                 Weight change:   Wt Readings from Last 3 Encounters:  08/06/20 62.5 kg  02/18/20 64.4 kg  02/18/17 70.3 kg     Consultants:see note  Procedures:see note Antimicrobials: Anti-infectives (From admission, onward)    None      Culture/Microbiology    Component Value Date/Time   SDES URINE, RANDOM 06/23/2015 1721   SPECREQUEST NONE 06/23/2015 1721   CULT 1,000 COLONIES/mL INSIGNIFICANT GROWTH 06/23/2015 1721   REPTSTATUS 06/25/2015 FINAL 06/23/2015 1721    Other culture-see note  Unresulted Labs (From admission, onward)     Start     Ordered   06/08/21 0076  Basic metabolic panel  Daily,   STAT     Question:  Specimen collection method  Answer:  Lab=Lab collect    06/07/21 1725   06/08/21 0500  CBC  Daily,   STAT     Question:  Specimen collection method  Answer:  Lab=Lab collect   06/07/21 1725   06/07/21 1726  Occult blood card to lab, stool  Once,   STAT        06/07/21 1725          Data Reviewed: I have personally reviewed following labs and imaging studies CBC: Recent Labs  Lab 06/07/21 1328 06/08/21 0635  WBC 5.5 4.9  HGB 8.6* 9.0*  HCT 28.8* 29.2*  MCV 80.4 79.6*  PLT 124* 226*   Basic Metabolic Panel: Recent Labs  Lab 06/07/21 1328 06/08/21 0635  NA 127* 131*  K 4.2 3.5  CL 89* 90*  CO2 30 30  GLUCOSE 74 65*  BUN 27* 25*  CREATININE 1.14* 1.01*  CALCIUM 9.0 8.7*   GFR: CrCl cannot be calculated (Unknown ideal weight.). Liver Function Tests: No results for input(s): AST, ALT, ALKPHOS, BILITOT, PROT, ALBUMIN in the last 168 hours. No results for input(s): LIPASE, AMYLASE in the last 168 hours. No results for input(s): AMMONIA in the last 168  hours. Coagulation Profile: No results for input(s): INR, PROTIME in the last 168 hours. Cardiac Enzymes: No results for input(s): CKTOTAL, CKMB, CKMBINDEX, TROPONINI in the last 168 hours. BNP (last 3 results) No results for input(s): PROBNP in the last 8760 hours. HbA1C: No results for input(s): HGBA1C in the last 72 hours. CBG: Recent Labs  Lab 06/08/21 0841  GLUCAP 76   Lipid Profile: No results for input(s): CHOL, HDL, LDLCALC, TRIG, CHOLHDL, LDLDIRECT in the last 72 hours. Thyroid Function Tests: No results for input(s): TSH, T4TOTAL, FREET4, T3FREE, THYROIDAB in the last 72 hours. Anemia Panel: Recent Labs    06/07/21 1328 06/08/21 0635  VITAMINB12  --  667  FOLATE 18.6  --   FERRITIN 19  --   TIBC 501*  --   IRON 26*  --   RETICCTPCT  --  1.8   Sepsis Labs: No results for input(s): PROCALCITON, LATICACIDVEN in the last 168 hours.  Recent Results (from the past 240 hour(s))  Resp Panel by RT-PCR (Flu A&B, Covid) Nasopharyngeal Swab     Status:  None   Collection Time: 06/07/21  4:56 PM   Specimen: Nasopharyngeal Swab; Nasopharyngeal(NP) swabs in vial transport medium  Result Value Ref Range Status   SARS Coronavirus 2 by RT PCR NEGATIVE NEGATIVE Final    Comment: (NOTE) SARS-CoV-2 target nucleic acids are NOT DETECTED.  The SARS-CoV-2 RNA is generally detectable in upper respiratory specimens during the acute phase of infection. The lowest concentration of SARS-CoV-2 viral copies this assay can detect is 138 copies/mL. A negative result does not preclude SARS-Cov-2 infection and should not be used as the sole basis for treatment or other patient management decisions. A negative result may occur with  improper specimen collection/handling, submission of specimen other than nasopharyngeal swab, presence of viral mutation(s) within the areas targeted by this assay, and inadequate number of viral copies(<138 copies/mL). A negative result must be combined with clinical observations, patient history, and epidemiological information. The expected result is Negative.  Fact Sheet for Patients:  EntrepreneurPulse.com.au  Fact Sheet for Healthcare Providers:  IncredibleEmployment.be  This test is no t yet approved or cleared by the [place] FDA and  has been authorized for detection and/or diagnosis of SARS-CoV-2 by FDA under an Emergency Use Authorization (EUA). This EUA will remain  in effect (meaning this test can be used) for the duration of the COVID-19 declaration under Section 564(b)(1) of the Act, 21 U.S.C.section 360bbb-3(b)(1), unless the authorization is terminated  or revoked sooner.       Influenza A by PCR NEGATIVE NEGATIVE Final   Influenza B by PCR NEGATIVE NEGATIVE Final    Comment: (NOTE) The Xpert Xpress SARS-CoV-2/FLU/RSV plus assay is intended as an aid in the diagnosis of influenza from Nasopharyngeal swab specimens and should not be used as a sole basis for  treatment. Nasal washings and aspirates are unacceptable for Xpert Xpress SARS-CoV-2/FLU/RSV testing.  Fact Sheet for Patients: EntrepreneurPulse.com.au  Fact Sheet for Healthcare Providers: IncredibleEmployment.be  This test is not yet approved or cleared by the [place] FDA and has been authorized for detection and/or diagnosis of SARS-CoV-2 by FDA under an Emergency Use Authorization (EUA). This EUA will remain in effect (meaning this test can be used) for the duration of the COVID-19 declaration under Section 564(b)(1) of the Act, 21 U.S.C. section 360bbb-3(b)(1), unless the authorization is terminated or revoked.  Performed at [facility], [address], [place], [place] [address]  Radiology Studies: DG Chest 2 View  Result Date: 06/07/2021 CLINICAL DATA:  Short of breath, fatigue. EXAM: CHEST - 2 VIEW COMPARISON:  08/03/2020 FINDINGS: Low lung volumes. Bilateral small effusions. No pulmonary edema. No infiltrate. No acute osseous abnormality. IMPRESSION: Low lung volumes and bilateral small effusions Electronically Signed   By: Suzy Bouchard M.D.   On: 06/07/2021 14:06     LOS: 0 days   Antonieta Pert, MD [facility]  06/08/2021, 10:36 AM

## 2021-06-09 LAB — BASIC METABOLIC PANEL
Anion gap: 10 (ref 5–15)
BUN: 24 mg/dL — ABNORMAL HIGH (ref 8–23)
CO2: 31 mmol/L (ref 22–32)
Calcium: 8.9 mg/dL (ref 8.9–10.3)
Chloride: 89 mmol/L — ABNORMAL LOW (ref 98–111)
Creatinine, Ser: 1.07 mg/dL — ABNORMAL HIGH (ref 0.44–1.00)
GFR, Estimated: 48 mL/min — ABNORMAL LOW (ref >60)
Glucose, Bld: 73 mg/dL (ref 70–99)
Potassium: 4.4 mmol/L (ref 3.5–5.1)
Sodium: 130 mmol/L — ABNORMAL LOW (ref 135–145)

## 2021-06-09 LAB — CBC
HCT: 27.8 % — ABNORMAL LOW (ref 36.0–46.0)
Hemoglobin: 8.8 g/dL — ABNORMAL LOW (ref 12.0–15.0)
MCH: 24.9 pg — ABNORMAL LOW (ref 26.0–34.0)
MCHC: 31.7 g/dL (ref 30.0–36.0)
MCV: 78.5 fL — ABNORMAL LOW (ref 80.0–100.0)
Platelets: 134 10*3/uL — ABNORMAL LOW (ref 150–400)
RBC: 3.54 MIL/uL — ABNORMAL LOW (ref 3.87–5.11)
RDW: 17.7 % — ABNORMAL HIGH (ref 11.5–15.5)
WBC: 5.8 10*3/uL (ref 4.0–10.5)
nRBC: 0 % (ref 0.0–0.2)

## 2021-06-09 LAB — SAVE SMEAR(SSMR), FOR PROVIDER SLIDE REVIEW

## 2021-06-09 LAB — PATHOLOGIST SMEAR REVIEW

## 2021-06-09 NOTE — Progress Notes (Addendum)
Allison Novak NOTE       Patient ID: Allison Novak MRN: [id number] DOB/AGE: 12/04/1925 88 y.o.  Admit date: 06/07/2021 Referring Physician Dr. Antonieta Novak Primary Physician Dr. Ramonita Novak Primary Cardiologist Dr. Donnelly Novak Reason for Consultation AoCHF  HPI: The patient is a 88 year old female with a past medical history significant for HFpEF (06/08/21 LVEF 55-60%, dilated RV and moderate pulm HTN, mild MR), persistent atrial fibrillation on dose reduced Eliquis, familial hypercholesterolemia, bilateral carotid artery disease, aortic atherosclerosis, history of TIA 2018, CKD 3 who presented to [facility] ED 06/07/2021 with a several week history of shortness of breath and fatigue.  Cardiology is consulted for assistance with diuresis  Interval History: -Echo resulted with preserved EF, dilated RV, mod pulm HTN and mild MR -Net negative 430mL overnight. Breathing better, admits to some lower leg cramping last night. No chest pain.   Review of systems complete and found to be negative unless listed above     Past Medical History:  Diagnosis Date   A-fib ([place])    CHF (congestive heart failure) ([place])    Coronary artery disease    Diverticulosis    GERD (gastroesophageal reflux disease)    Gout    Hypertension    Osteoporosis     Past Surgical History:  Procedure Laterality Date   ABDOMINAL HYSTERECTOMY     ANKLE SURGERY Left 2011   KNEE ARTHROSCOPY Right     Medications Prior to Admission  Medication Sig Dispense Refill Last Dose   allopurinol (ZYLOPRIM) 100 MG tablet Take 100 mg by mouth daily.   06/07/2021   atorvastatin (LIPITOR) 40 MG tablet Take 40 mg by mouth daily.  2 06/06/2021   calcium carbonate (OSCAL) 1500 (600 Ca) MG TABS tablet Take 1 tablet by mouth in the morning and at bedtime.   06/07/2021   Cholecalciferol 50 MCG (2000 UT) CAPS Take 2,000 Units by mouth daily.   06/07/2021   ELIQUIS 2.5 MG TABS tablet Take 2.5 mg by mouth 2 (two) times daily.    06/07/2021   furosemide (LASIX) 20 MG tablet Take 2 tablets (40 mg total) by mouth daily. 60 tablet 0 06/07/2021   losartan (COZAAR) 100 MG tablet Take 1 tablet (100 mg total) by mouth daily. 30 tablet 0 06/07/2021   metoprolol succinate (TOPROL-XL) 200 MG 24 hr tablet Take 1 tablet (200 mg total) by mouth daily. Take with or immediately following a meal. 30 tablet 0 06/07/2021   omeprazole (PRILOSEC) 40 MG capsule Take 40 mg by mouth daily.    06/07/2021   oxybutynin (DITROPAN) 5 MG tablet Take 5 mg by mouth at bedtime.   06/06/2021   potassium chloride (KLOR-CON) 10 MEQ tablet Take 1 tablet (10 mEq total) by mouth daily. 30 tablet 0 06/07/2021   azelastine (ASTELIN) 0.1 % nasal spray Place 1 spray into both nostrils daily.   prn at unknown   diltiazem (CARDIZEM CD) 120 MG 24 hr capsule Take 120 mg by mouth daily.       Social History   Socioeconomic History   Marital status: Widowed    Spouse name: Not on file   Number of children: Not on file   Years of education: Not on file   Highest education level: Not on file  Occupational History   Not on file  Tobacco Use   Smoking status: Never   Smokeless tobacco: Never  Substance and Sexual Activity   Alcohol use: No    Alcohol/week: 0.0 standard  drinks   Drug use: No   Sexual activity: Never  Other Topics Concern   Not on file  Social History Narrative   Not on file   Social Determinants of Health   Financial Resource Strain: Not on file  Food Insecurity: Not on file  Transportation Needs: Not on file  Physical Activity: Not on file  Stress: Not on file  Social Connections: Not on file  Intimate Partner Violence: Not on file    Family History  Problem Relation Age of Onset   CAD Mother    Hypertension Sister       Review of systems complete and found to be negative unless listed above    PHYSICAL EXAM General: Pleasant elderly caucasian female , well nourished, in no acute distress. Sitting upright with legs hanging off  bed, daughter at bedside.  HEENT:  Normocephalic and atraumatic. Neck:  No JVD.  Lungs: Normal respiratory effort on room air. Poor air movement, RLL crackles.  Heart: Irregular rhythm and RR . Normal S1 and S2 without gallops or murmurs. Radial & DP pulses 2+ bilaterally. Abdomen: Non-distended appearing.  Msk: Normal strength and tone for age. Extremities: Warm and well perfused. No clubbing, cyanosis. Bilateral 2+ pitting edema with associated chronic venous stasis skin changes.  Neuro: Alert and oriented X 3. Psych:  Answers questions appropriately.   Labs:   Novak Results  Component Value Date   WBC 5.8 06/09/2021   HGB 8.8 (L) 06/09/2021   HCT 27.8 (L) 06/09/2021   MCV 78.5 (L) 06/09/2021   PLT 134 (L) 06/09/2021    Recent Labs  Novak 06/09/21 0412  NA 130*  K 4.4  CL 89*  CO2 31  BUN 24*  CREATININE 1.07*  CALCIUM 8.9  GLUCOSE 73    Novak Results  Component Value Date   TROPONINI <0.03 02/18/2017     Novak Results  Component Value Date   CHOL 186 02/19/2017   Novak Results  Component Value Date   HDL 50 02/19/2017   Novak Results  Component Value Date   LDLCALC 112 (H) 02/19/2017   Novak Results  Component Value Date   TRIG 118 02/19/2017   Novak Results  Component Value Date   CHOLHDL 3.7 02/19/2017   No results found for: LDLDIRECT    Radiology: DG Chest 2 View  Result Date: 06/07/2021 CLINICAL DATA:  Short of breath, fatigue. EXAM: CHEST - 2 VIEW COMPARISON:  08/03/2020 FINDINGS: Low lung volumes. Bilateral small effusions. No pulmonary edema. No infiltrate. No acute osseous abnormality. IMPRESSION: Low lung volumes and bilateral small effusions Electronically Signed   By: Allison Novak M.D.   On: 06/07/2021 14:06   ECHOCARDIOGRAM COMPLETE  Result Date: 06/08/2021    ECHOCARDIOGRAM REPORT   Patient Name:   Allison Novak Date of Exam: 06/08/2021 Medical Rec #:  [id number]       Height:       61.0 in Accession #:    [id number]      Weight:       137.8 lb  Date of Birth:  28-Jun-1925       BSA:          1.612 m Patient Age:    25 years        BP:           115/78 mmHg Patient Gender: F               HR:  73 bpm. Exam Location:  [facility] Procedure: 2D Echo, Color Doppler and Cardiac Doppler Indications:     I50.31 congestive heart failure-Acute Diastolic  History:         Patient has prior history of Echocardiogram examinations, most                  recent 08/03/2020. CHF, CAD; Risk Factors:Hypertension.  Sonographer:     Charmayne Sheer Referring Phys:  [id number] [place] Diagnosing Phys: [place]  1. Left ventricular ejection fraction, by estimation, is 55 to 60%. The left ventricle has normal function. The left ventricle has no regional wall motion abnormalities. Left ventricular diastolic parameters are indeterminate.  2. Right ventricular systolic function is mildly reduced. The right ventricular size is moderately enlarged. There is moderately elevated pulmonary artery systolic pressure.  3. Right atrial size was mildly dilated.  4. The mitral valve is normal in structure. Mild mitral valve regurgitation. No evidence of mitral stenosis.  5. The aortic valve is normal in structure. Aortic valve regurgitation is not visualized. Aortic valve sclerosis is present, with no evidence of aortic valve stenosis.  6. The inferior vena cava is dilated in size with >50% respiratory variability, suggesting right atrial pressure of 8 mmHg. Conclusion(s)/Recommendation(s): MODERATE PULMONARY HYPERTENSION WITH DILATED RV. FINDINGS  Left Ventricle: Left ventricular ejection fraction, by estimation, is 55 to 60%. The left ventricle has normal function. The left ventricle has no regional wall motion abnormalities. The left ventricular internal cavity size was normal in size. There is  no left ventricular hypertrophy. Left ventricular diastolic parameters are indeterminate. Right Ventricle: The right ventricular size is moderately enlarged. No increase in right  ventricular wall thickness. Right ventricular systolic function is mildly reduced. There is moderately elevated pulmonary artery systolic pressure. The tricuspid regurgitant velocity is 3.75 m/s, and with an assumed right atrial pressure of 8 mmHg, the estimated right ventricular systolic pressure is 00.1 mmHg. Left Atrium: Left atrial size was normal in size. Right Atrium: Right atrial size was mildly dilated. Pericardium: There is no evidence of pericardial effusion. Mitral Valve: The mitral valve is normal in structure. Mild mitral valve regurgitation. No evidence of mitral valve stenosis. MV peak gradient, 3.4 mmHg. The mean mitral valve gradient is 1.0 mmHg. Tricuspid Valve: The tricuspid valve is normal in structure. Tricuspid valve regurgitation is mild . No evidence of tricuspid stenosis. Aortic Valve: The aortic valve is normal in structure. Aortic valve regurgitation is not visualized. Aortic valve sclerosis is present, with no evidence of aortic valve stenosis. Aortic valve mean gradient measures 3.0 mmHg. Aortic valve peak gradient measures 5.7 mmHg. Aortic valve area, by VTI measures 1.34 cm. Pulmonic Valve: The pulmonic valve was normal in structure. Pulmonic valve regurgitation is not visualized. No evidence of pulmonic stenosis. Aorta: The aortic root is normal in size and structure. Venous: The inferior vena cava is dilated in size with greater than 50% respiratory variability, suggesting right atrial pressure of 8 mmHg. IAS/Shunts: No atrial level shunt detected by color flow Doppler.  LEFT VENTRICLE PLAX 2D LVIDd:         4.11 cm   Diastology LVIDs:         3.01 cm   LV e' medial:    5.77 cm/s LV PW:         0.92 cm   LV E/e' medial:  17.5 LV IVS:        0.91 cm   LV e' lateral:   4.68 cm/s LVOT  diam:     1.70 cm   LV E/e' lateral: 21.6 LV SV:         34 LV SV Index:   21 LVOT Area:     2.27 cm  RIGHT VENTRICLE RV Basal diam:  3.73 cm LEFT ATRIUM             Index        RIGHT ATRIUM            Index LA diam:        3.90 cm 2.42 cm/m   RA Area:     19.20 cm LA Vol (A2C):   39.8 ml 24.69 ml/m  RA Volume:   51.50 ml  31.94 ml/m LA Vol (A4C):   40.2 ml 24.93 ml/m LA Biplane Vol: 40.0 ml 24.81 ml/m  AORTIC VALVE                    PULMONIC VALVE AV Area (Vmax):    1.34 cm     PV Vmax:       0.67 m/s AV Area (Vmean):   1.36 cm     PV Vmean:      47.900 cm/s AV Area (VTI):     1.34 cm     PV VTI:        0.116 m AV Vmax:           119.00 cm/s  PV Peak grad:  1.8 mmHg AV Vmean:          80.000 cm/s  PV Mean grad:  1.0 mmHg AV VTI:            0.251 m AV Peak Grad:      5.7 mmHg AV Mean Grad:      3.0 mmHg LVOT Vmax:         70.50 cm/s LVOT Vmean:        48.100 cm/s LVOT VTI:          0.148 m LVOT/AV VTI ratio: 0.59  AORTA Ao Root diam: 2.90 cm MITRAL VALVE                TRICUSPID VALVE MV Area (PHT): 4.08 cm     TR Peak grad:   56.2 mmHg MV Area VTI:   1.34 cm     TR Vmax:        375.00 cm/s MV Peak grad:  3.4 mmHg MV Mean grad:  1.0 mmHg     SHUNTS MV Vmax:       0.93 m/s     Systemic VTI:  0.15 m MV Vmean:      55.8 cm/s    Systemic Diam: 1.70 cm MV Decel Time: 186 msec MV E velocity: 101.00 cm/s MV A velocity: 36.40 cm/s MV E/A ratio:  2.77 Allison Novak Electronically signed by Allison Novak Signature Date/Time: 06/08/2021/12:58:03 PM    Final     ECHO LVEF 55-60%, dilated RV and moderate pulm HTN, mild MR)  TELEMETRY reviewed by me: Afib rate 73-78  EKG reviewed by me: Afib rate 64, RVH  ASSESSMENT AND PLAN:  The patient is a 88 year old female with a past medical history significant for HFpEF (06/08/21 LVEF 55-60%, dilated RV and moderate pulm HTN, mild MR), persistent atrial fibrillation on dose reduced Eliquis, familial hypercholesterolemia, bilateral carotid artery disease, aortic atherosclerosis, history of TIA 2018, CKD 3 who presented to [facility] ED 06/07/2021 with a several week history of shortness of breath and fatigue.  Cardiology  is consulted for assistance with diuresis  #Acute on  chronic HFpEF (LVEF 55-60%, dilated RV and moderate pulm HTN, mild MR) Currently presenting as a NYHA class II-III. BNP 585 on admission.  Continues to appear volume overloaded with bilateral LEE.  -Continue IV lasix 40mg  BID for now and will reassess daily.  -losartan on hold and metoprolol reduced to 50mg  QD as her BP has been soft.  -Home GDMT includes metoprolol succinate 200 mg, losartan 50 mg and Lasix 40 mg a.m./20 mg p.m. -Recommend strict I's and O's, salt restriction, daily weights, and getting OOB as tolerated.   #anemia HgB 8.6-9.0 since admission, baseline from 3/31 was 11.3 Query if this is contributing to her SOB in some way. Iron was low at 26 and TIBC up at 501.   #Persistent atrial fibrillation Currently rate controlled on metoprolol succinate 50mg  daily (home dose 200 as above) and anticoagulated with dose reduced Eliquis 2.5 mg twice daily for stroke risk reduction -CHA2DS2-VASc 6  #CKD 3a -renal function still around her baseline at 1.07, gfr 48. (Baseline from 07/2020 cr 1.17/ gfr 44) -will continue to monitor  #Familial hypercholesterolemia #hypertension -Continue high intensity atorvastatin 40 mg -other plan as above  This patient's plan of care was discussed and created with Dr. Donnelly Novak and he is in agreement.  Signed: Tristan Schroeder , PA-C 06/09/2021, 9:46 AM [facility] Cardiology Please [place] 7a-4p M-F.

## 2021-06-09 NOTE — Evaluation (Signed)
Occupational Therapy Evaluation Patient Details Name: Allison Novak MRN: [id number] DOB: 02-03-1926 Today's Date: 06/09/2021   History of Present Illness Patient is a 88 year old female who reports to [facility] with complaints of shortness of breath and fatigue. PMH(+) for permanent A. fib on Eliquis, chronic combined systolic and diastolic CHF last LVEF 81-01%B5 DD ( 08/03/2020) on oral Lasix, history of TIA/carotid stenosis/aortic atherosclerosis.   Clinical Impression   Pt seen for OT evaluation this date. Upon arrival to room, pt sitting in recliner following session with PT. Pt A&Ox4 and agreeable to OT evaluation. Prior admission, pt was living alone on the main floor of a 2-level home with ramped entrance. Pt is typically mod-independent with SPC for functional mobility, ADLs, and IADLs, but reports that in the days leading up to this hospital admission, was using her RW and required increased time/effort to perform ADLs d/t becoming easily fatigued and out of breath with minimal exertion. Pt currently requires SUPERVISION for functional mobility of short household distances with RW, MIN A for toilet transfers, SUPERVISION for seated toilet hygiene, and SUPERVISION for standing hand hygiene d/t decreased activity tolerance. Pt reported that she has decreased endurance compared to baseline, but also requested to participate in stair training at end of OT tx. Pt educated on importance of activity pacing, with pt verbalizing understanding. Pt would benefit from additional skilled OT services to facilitate implementation of energy conservation strategies into daily activities and to maximize return to PLOF. Upon discharge, anticipate pt requiring no OT follow up.    Recommendations for follow up therapy are one component of a multi-disciplinary discharge planning process, led by the attending physician.  Recommendations may be updated based on patient status, additional functional criteria and insurance  authorization.   Follow Up Recommendations  No OT follow up    Assistance Recommended at Discharge PRN  Patient can return home with the following A little help with walking and/or transfers;A little help with bathing/dressing/bathroom    Functional Status Assessment  Patient has had a recent decline in their functional status and demonstrates the ability to make significant improvements in function in a reasonable and predictable amount of time.  Equipment Recommendations  None recommended by OT       Precautions / Restrictions Precautions Precautions: Fall Restrictions Weight Bearing Restrictions: No      Mobility Bed Mobility               General bed mobility comments: not assessed, pt in recliner pre/post session    Transfers Overall transfer level: Needs assistance Equipment used: Rolling walker (2 wheels) Transfers: Sit to/from Stand Sit to Stand: Supervision, Min assist           General transfer comment: Requires verbal cues for safe hand placement with RW use. Required MIN A to perform sit>stand transfer from toilet (although pt reports that her toilet at home is higher)      Balance Overall balance assessment: Mild deficits observed, not formally tested                                         ADL either performed or assessed with clinical judgement   ADL Overall ADL's : Needs assistance/impaired     Grooming: Wash/dry hands;Supervision/safety;Standing                   Toilet Transfer: Minimal assistance;Regular Toilet;Rolling walker (  2 wheels);Grab bars Toilet Transfer Details (indicate cue type and reason): Required physical assist for upward momentum. Pt reports that her toilet at home is higher Toileting- Clothing Manipulation and Hygiene: Supervision/safety;Sitting/lateral lean       Functional mobility during ADLs: Supervision/safety;Rolling walker (2 wheels) (to walk to/from bathroom)       Vision Patient  Visual Report: No change from baseline              Pertinent Vitals/Pain Pain Assessment Pain Assessment: No/denies pain     Hand Dominance Right   Extremity/Trunk Assessment Upper Extremity Assessment Upper Extremity Assessment: Overall WFL for tasks assessed   Lower Extremity Assessment Lower Extremity Assessment: Overall WFL for tasks assessed       Communication Communication Communication: No difficulties   Cognition Arousal/Alertness: Awake/alert Behavior During Therapy: WFL for tasks assessed/performed Overall Cognitive Status: Within Functional Limits for tasks assessed                                 General Comments: A&Ox4                Home Living Family/patient expects to be discharged to:: Private residence Living Arrangements: Alone (states her one son is in and out throughout the day; other son lives about 2 hours away) Available Help at Discharge: Family;Available PRN/intermittently Type of Home: House Home Access: Ramped entrance     Home Layout: Able to live on main level with bedroom/bathroom;Two level Alternate Level Stairs-Number of Steps: 12 (does not go upstairs but does go into the basement (basement steps have railing)) Alternate Level Stairs-Rails: Left Bathroom Shower/Tub: Teacher, early years/pre: Standard     Home Equipment: Conservation officer, nature (2 wheels);Cane - single point;Wheelchair - manual;Grab bars - tub/shower;Shower seat;BSC/3in1          Prior Functioning/Environment Prior Level of Function : Independent/Modified Independent             Mobility Comments: Typically walks with SPC, but over the past couple of days was utilizing her RW for stability d/t experiencing SOB with minimal activity ADLs Comments: Typically independent with ADLs, but over the past couple of days has required increased time/effort d/t experiencing SOB with minimal activity        OT Problem List: Decreased activity  tolerance;Decreased knowledge of use of DME or AE      OT Treatment/Interventions: Self-care/ADL training;Therapeutic exercise;Energy conservation;DME and/or AE instruction;Therapeutic activities;Patient/family education;Balance training    OT Goals(Current goals can be found in the care plan section) Acute Rehab OT Goals Patient Stated Goal: to return home OT Goal Formulation: With patient Time For Goal Achievement: 06/23/21 Potential to Achieve Goals: Good ADL Goals Pt Will Perform Grooming: with modified independence;standing Pt Will Perform Lower Body Dressing: with modified independence;sit to/from stand Pt Will Transfer to Toilet: with modified independence;ambulating;regular height toilet Additional ADL Goal #1: Pt will independently initiate at least 1 energy conservation strategy during standing ADLs  OT Frequency: Min 1X/week       AM-PAC OT "6 Clicks" Daily Activity     Outcome Measure Help from another person eating meals?: None Help from another person taking care of personal grooming?: A Little Help from another person toileting, which includes using toliet, bedpan, or urinal?: A Little Help from another person bathing (including washing, rinsing, drying)?: A Little Help from another person to put on and taking off regular upper body clothing?: None Help  from another person to put on and taking off regular lower body clothing?: A Little 6 Click Score: 20   End of Session Equipment Utilized During Treatment: Rolling walker (2 wheels) Nurse Communication: Mobility status  Activity Tolerance: Patient tolerated treatment well Patient left: in chair;with call bell/phone within reach;with chair alarm set;with family/visitor present  OT Visit Diagnosis: Unsteadiness on feet (R26.81);History of falling (Z91.81)                Time: 1497-0263 OT Time Calculation (min): 20 min Charges:  OT General Charges $OT Visit: 1 Visit OT Evaluation $OT Eval Moderate Complexity: 1  Mod OT Treatments $Self Care/Home Management : 8-22 mins  Fredirick Maudlin, OTR/L [place]

## 2021-06-09 NOTE — Evaluation (Signed)
Physical Therapy Evaluation Patient Details Name: Allison Novak MRN: [id number] DOB: [birth year] Today's Date: 06/09/2021  History of Present Illness  Patient is a 88 year old female who reports to [facility] with complaints of shortness of breath and fatigue. PMH(+) for permanent A. fib on Eliquis, chronic combined systolic and diastolic CHF last LVEF 58-09%X8 DD ( 08/03/2020) on oral Lasix, history of TIA/carotid stenosis/aortic atherosclerosis.   Clinical Impression  Patient tolerated session well and was agreeable to treatment. No pain reported throughout session. Upon arrival patient was sitting in recliner with family (son) present. Patient states that she lives alone at home with her son checking in on her throughout the day (her other son). Patient states she was Independent/ Mod I with all mobility and ADLs prior to hospitalization. She stated that just recently she has started utilizing her 2 wheeled walker for increased stability. Only 1 reported fall in the past 3 months per son from a posterior LOB and she was not utilizing an AD. No injury from this fall reported. Patient demonstrated at least 4-/5 strength in BLEs, and no LOB or unsteadiness during sit to stand transfers from recliner or toliet, and ambulated one and a half laps around the nurses station. Patient did start to complain of SOB during ambulation however vitals stable and WFL throughout treatment session. Patient was cued to head back to room at this point. Patient left in recliner with OT present. Patient demonstrated near/at base line level of function and does not need any further physical therapy. Signing off.      Recommendations for follow up therapy are one component of a multi-disciplinary discharge planning process, led by the attending physician.  Recommendations may be updated based on patient status, additional functional criteria and insurance authorization.  Follow Up Recommendations No PT follow up    Assistance  Recommended at Discharge PRN  Patient can return home with the following       Equipment Recommendations None recommended by PT  Recommendations for Other Services       Functional Status Assessment Patient has had a recent decline in their functional status and demonstrates the ability to make significant improvements in function in a reasonable and predictable amount of time.     Precautions / Restrictions Precautions Precautions: Fall Restrictions Weight Bearing Restrictions: No      Mobility  Bed Mobility Overal bed mobility:  (deferred- upon arrival patient was sitting in recliner)                  Transfers Overall transfer level: Modified independent Equipment used: Rolling walker (2 wheels) Transfers: Sit to/from Stand                  Ambulation/Gait Ambulation/Gait assistance: Modified independent (Device/Increase time) Gait Distance (Feet): 230 Feet Assistive device: Rolling walker (2 wheels) Gait Pattern/deviations: WFL(Within Functional Limits)       General Gait Details: normal gait speed, no LOB or unsteadiness noted; patient complained of SOB at around 200 feet and was cued to ambulate back to room; HR ranged from 94-103bpm throughout ambulation; O2 90% room air  Stairs            Wheelchair Mobility    Modified Rankin (Stroke Patients Only)       Balance Overall balance assessment: Modified Independent  Pertinent Vitals/Pain Pain Assessment Pain Assessment: No/denies pain    Home Living Family/patient expects to be discharged to:: Private residence Living Arrangements: Alone (states her one son is in and out throughout the day; other son lives about 2 hours away) Available Help at Discharge: Family;Available PRN/intermittently Type of Home: House Home Access: Ramped entrance     Alternate Level Stairs-Number of Steps: 12 (does not go upstairs but does go  into the basement (basement steps have railing)) Home Layout: Able to live on main level with bedroom/bathroom;Two level Home Equipment: Conservation officer, nature (2 wheels);Cane - single point;Wheelchair - manual;Grab bars - tub/shower      Prior Function Prior Level of Function : Independent/Modified Independent             Mobility Comments: Past couple of days was utilizing her RW for stability ADLs Comments: Mod I     Hand Dominance   Dominant Hand: Right    Extremity/Trunk Assessment   Upper Extremity Assessment Upper Extremity Assessment: Overall WFL for tasks assessed    Lower Extremity Assessment Lower Extremity Assessment: Overall WFL for tasks assessed (at least 4-/5 strength bilaterally)       Communication   Communication: No difficulties  Cognition Arousal/Alertness: Awake/alert Behavior During Therapy: WFL for tasks assessed/performed Overall Cognitive Status: Within Functional Limits for tasks assessed                                 General Comments: A&Ox3 location, situation, year        General Comments      Exercises Other Exercises Other Exercises: patient able to complete sit to stand from Recliner and [place] seat Mod I with BUE support from walker and or grab bar; completed hand hygiene Mod I post utilization of restroom Other Exercises: patient educated on role of PT, fall risk, and D/C plans   Assessment/Plan    PT Assessment Patient does not need any further PT services  PT Problem List         PT Treatment Interventions      PT Goals (Current goals can be found in the Care Plan section)  Acute Rehab PT Goals Patient Stated Goal: to go home PT Goal Formulation: With patient Time For Goal Achievement: 06/23/21 Potential to Achieve Goals: Good    Frequency       Co-evaluation               AM-PAC PT "6 Clicks" Mobility  Outcome Measure Help needed turning from your back to your side while in a flat bed without  using bedrails?: None Help needed moving from lying on your back to sitting on the side of a flat bed without using bedrails?: None Help needed moving to and from a bed to a chair (including a wheelchair)?: None Help needed standing up from a chair using your arms (e.g., wheelchair or bedside chair)?: None Help needed to walk in hospital room?: None Help needed climbing 3-5 steps with a railing? : None 6 Click Score: 24    End of Session Equipment Utilized During Treatment: Gait belt Activity Tolerance: Patient tolerated treatment well;No increased pain;Patient limited by fatigue Patient left: in chair;with call bell/phone within reach;with family/visitor present (and OT present in room) Nurse Communication: Mobility status      Time: 7062-3762 PT Time Calculation (min) (ACUTE ONLY): 34 min   Charges:   PT Evaluation $PT Eval Low Complexity: 1 Low  PT Treatments $Gait Training: 8-22 mins        Iva Boop, PT  06/09/21. 3:04 PM

## 2021-06-09 NOTE — Consult Note (Signed)
° °  Heart Failure Nurse Navigator Note  HFpEF 50 to 55%.  Moderate pulmonary hypertension.  Dilated right ventricle.  Mild mitral regurgitation.  She presented from home with complaints of shortness of breath on exertion, weight gain, orthopnea, dry cough, fatigue and lower extremity edema.  Comorbidities:  Permanent atrial fibrillation carotid stenosis GERD Hyperlipidemia Anemia  Medications:  Eliquis 2.5 mg twice a day Atorvastatin 40 mg at bedtime Furosemide 40 mg IV 2 times a day Metoprolol succinate 50 mg daily   Labs:  Sodium 130, potassium 4.4, chloride 89, CO2 31, BUN 24, creatinine 1.07, GFR 48, BNP was 585 on admission Weight is 64 kg Blood pressure 98/59 Intake 720 mL Output 1200 mL   Initial meeting with patient on this admission.  Her daughter, Hoyle Sauer is also present at the bedside.  Patient states that she lives at home by herself she has a son that is in and out all day long.  She is the one that prepares her meals and aches care of her house and laundry.  She states usually she is able to ambulate around her house without the use of a walker but with the swelling in her leg it was harder for her to ambulate so she was using the walker just prior to admission.  He does admit to using salt on some foods, explained the relationship between salt and fluids and that salt is not her friend.  Recommended salt substitute.  She does use canned soups but buys low-sodium.  She felt that she was compliant with her medications as she has her pill set up in a weekly pillbox.  Discussed follow-up in the outpatient heart failure clinic, she has an appointment for February 6 at 4 PM.  She has a 0% of no-show which is 0 out of 14 appointments.  Pricilla Riffle RN [facility]

## 2021-06-09 NOTE — Progress Notes (Signed)
PROGRESS NOTE    TAMIA DIAL  XHB:[id number] DOB: 1925-09-27 DOA: 06/07/2021 PCP: Adin Hector, MD   Chief Complaint  Patient presents with   Shortness of Breath  Brief Narrative/Hospital Course: [name], 88 y.o. female with PMH of for permanent A. fib on Eliquis, chronic combined systolic and diastolic CHF last LVEF 81-01%B5 DD ( 08/03/2020) on oral Lasix, history of TIA/carotid stenosis/aortic atherosclerosis presented to the ED with complaint of shortness of breath and fatigue symptoms ongoing for several weeks.  Symptoms worse with laying down initially but now at all position and worse with exertion along with dry cough, increased leg swelling and several pound weight gain.  Patient has recently started taking increased dose of Lasix 40 in the morning and 20 in the day, recently seen by her cardiologist and Lasix was being adjusted without improvement in the symptoms.   ED Course: Hemodynamically stable, afebrile, on room air, labs showed hyponatremia 127 elevated BNP 595, normal at bedtime troponin, anemia with drop in hemoglobin 8.6 g from previous baseline 11.3 on 08/06/2020.  EKG personally reviewed, with atrial fibrillation, chest x-ray showed low lung volumes and bilateral small effusion. Patient was given IV Lasix and admission requested for further management   Subjective: Feels some better Eating  Daughter at bedside Overnight blood pressure stable 100-115s, saturating well at 95% Labs shows improving creatinine and hemoglobin Last wt 137 in her doctor office - normally weigh around 130lb.  Assessment & Plan:  Acute on chronic  diastolic ZWC:HENI LVEF 77-82%U2 DD (08/03/20).  Given patient presentation of suspect acute exacerbation of CHF.  Repeat echocardiogram shows preserved ef, mildly reduced rv function, continue with IV diuresis.monitor daily wt stririct I/O.Cont salt and fluid restricted diet.  GDMT: Metoprolol will be reduced to low-dose and hold off  losartan to allow room for diuresis.  Cardiology is following..Requested daily weight. Net IO Since Admission: -1,640 mL [06/09/21 1326]  Filed Weights   06/08/21 1411 06/09/21 0447  Weight: 63.4 kg 64 kg      HTN: BP soft losartan discontinued metoprolol dose decreased.  Continue diuresis.     History of TIA/carotid stenosis/aortic atherosclerosis Hyperlipidemia: Stable.  Continue continue on patient's anticoagulation, Lipitor   GERD/hiatal hernia: cont PPI  Hypoglycemia: resolved   Anemia, unclear etiology likely from chronic disease in the setting of Eliquis use,history of hemorrhoids. Had GI evaluation 2021. As anemia has slowly worsened will advise GI f/u as outpatient. Here hgb stable.   Recent Labs  Lab 06/07/21 1328 06/08/21 0635 06/09/21 0412  HGB 8.6* 9.0* 8.8*  HCT 28.8* 29.2* 27.8*     Permanent atrial fibrillation:on eliquis, continue her metoprolol.  Rate is controlled    Hyponatremia: suspect in the setting of CHF and hypervolemia expect improvement with diuresis. Recent Labs  Lab 06/07/21 1328 06/08/21 0635 06/09/21 0412  NA 127* 131* 130*    DVT prophylaxis: apixaban (ELIQUIS) tablet 2.5 mg Start: 06/07/21 2200 Code Status:   Code Status: Full Code Family Communication: plan of care discussed with patient and daughter at bedside. Status is: admitted as Observation Remains inpatient will need at least 2 midnight stay for ongoing IV diuresis we will change to inpatient Planned Discharge Destination: from home. Lives independently, plan for  Home with Home Health Disposition: Currently not medically stable for discharge. Anticipated Disposition: 1-2 days  Total time spent in the care of this patient 50 MINUTES Objective: Vitals last 24 hrs: Vitals:   06/09/21 0447 06/09/21 0551 06/09/21 0739 06/09/21  1115  BP:  (!) 144/87 96/63 (!) 98/59  Pulse:  86 79 98  Resp:  18    Temp:   (!) 97.2 F (36.2 C)   TempSrc:      SpO2:  91% 90% 93%  Weight: 64  kg     Height:       Weight change:   Intake/Output Summary (Last 24 hours) at 06/09/2021 1326 Last data filed at 06/09/2021 0900 Gross per 24 hour  Intake 960 ml  Output 1200 ml  Net -240 ml   Net IO Since Admission: -1,640 mL [06/09/21 1326]   Physical Examination: General exam: Aaox3, comfortable pleasant, weak,older than stated age. HEENT:Oral mucosa moist, Ear/Nose WNL grossly,dentition normal. Respiratory system: B/l diminished BS, no use of accessory muscle, non tender. Cardiovascular system: S1 & S2 +,No JVD. Gastrointestinal system: Abdomen soft, NT,ND, BS+. Nervous System:Alert, awake, moving extremities. Extremities: edema 3+ b/l LE, distal peripheral pulses palpable.  Skin: No rashes, no icterus. MSK: Normal muscle bulk, tone, power.  Medications reviewed:  Scheduled Meds:  apixaban  2.5 mg Oral BID   atorvastatin  40 mg Oral QHS   furosemide  40 mg Intravenous BID   metoprolol succinate  50 mg Oral Daily   pantoprazole  40 mg Oral Daily   Continuous Infusions:  Diet Order             Diet Heart Room service appropriate? Yes; Fluid consistency: Thin; Fluid restriction: 1200 mL Fluid  Diet effective now                 Weight change:   Wt Readings from Last 3 Encounters:  06/09/21 64 kg  08/06/20 62.5 kg  02/18/20 64.4 kg     Consultants:see note  Procedures:see note Antimicrobials: Anti-infectives (From admission, onward)    None      Culture/Microbiology    Component Value Date/Time   SDES URINE, RANDOM 06/23/2015 1721   SPECREQUEST NONE 06/23/2015 1721   CULT 1,000 COLONIES/mL INSIGNIFICANT GROWTH 06/23/2015 1721   REPTSTATUS 06/25/2015 FINAL 06/23/2015 1721    Other culture-see note  Unresulted Labs (From admission, onward)     Start     Ordered   06/08/21 4696  Basic metabolic panel  Daily,   STAT     Question:  Specimen collection method  Answer:  Lab=Lab collect   06/07/21 1725   06/08/21 0500  CBC  Daily,   STAT      Question:  Specimen collection method  Answer:  Lab=Lab collect   06/07/21 1725   06/07/21 1726  Occult blood card to lab, stool  Once,   STAT        06/07/21 1725          Data Reviewed: I have personally reviewed following labs and imaging studies CBC: Recent Labs  Lab 06/07/21 1328 06/08/21 0635 06/09/21 0412  WBC 5.5 4.9 5.8  HGB 8.6* 9.0* 8.8*  HCT 28.8* 29.2* 27.8*  MCV 80.4 79.6* 78.5*  PLT 124* 114* 295*   Basic Metabolic Panel: Recent Labs  Lab 06/07/21 1328 06/08/21 0635 06/09/21 0412  NA 127* 131* 130*  K 4.2 3.5 4.4  CL 89* 90* 89*  CO2 30 30 31   GLUCOSE 74 65* 73  BUN 27* 25* 24*  CREATININE 1.14* 1.01* 1.07*  CALCIUM 9.0 8.7* 8.9   GFR: Estimated Creatinine Clearance: 28.2 mL/min (A) (by C-G formula based on SCr of 1.07 mg/dL (H)). Liver Function Tests: No results for input(s): AST,  ALT, ALKPHOS, BILITOT, PROT, ALBUMIN in the last 168 hours. No results for input(s): LIPASE, AMYLASE in the last 168 hours. No results for input(s): AMMONIA in the last 168 hours. Coagulation Profile: No results for input(s): INR, PROTIME in the last 168 hours. Cardiac Enzymes: No results for input(s): CKTOTAL, CKMB, CKMBINDEX, TROPONINI in the last 168 hours. BNP (last 3 results) No results for input(s): PROBNP in the last 8760 hours. HbA1C: No results for input(s): HGBA1C in the last 72 hours. CBG: Recent Labs  Lab 06/08/21 0841  GLUCAP 76   Lipid Profile: No results for input(s): CHOL, HDL, LDLCALC, TRIG, CHOLHDL, LDLDIRECT in the last 72 hours. Thyroid Function Tests: No results for input(s): TSH, T4TOTAL, FREET4, T3FREE, THYROIDAB in the last 72 hours. Anemia Panel: Recent Labs    06/07/21 1328 06/08/21 0635  VITAMINB12  --  667  FOLATE 18.6  --   FERRITIN 19  --   TIBC 501*  --   IRON 26*  --   RETICCTPCT  --  1.8   Sepsis Labs: No results for input(s): PROCALCITON, LATICACIDVEN in the last 168 hours.  Recent Results (from the past 240  hour(s))  Resp Panel by RT-PCR (Flu A&B, Covid) Nasopharyngeal Swab     Status: None   Collection Time: 06/07/21  4:56 PM   Specimen: Nasopharyngeal Swab; Nasopharyngeal(NP) swabs in vial transport medium  Result Value Ref Range Status   SARS Coronavirus 2 by RT PCR NEGATIVE NEGATIVE Final    Comment: (NOTE) SARS-CoV-2 target nucleic acids are NOT DETECTED.  The SARS-CoV-2 RNA is generally detectable in upper respiratory specimens during the acute phase of infection. The lowest concentration of SARS-CoV-2 viral copies this assay can detect is 138 copies/mL. A negative result does not preclude SARS-Cov-2 infection and should not be used as the sole basis for treatment or other patient management decisions. A negative result may occur with  improper specimen collection/handling, submission of specimen other than nasopharyngeal swab, presence of viral mutation(s) within the areas targeted by this assay, and inadequate number of viral copies(<138 copies/mL). A negative result must be combined with clinical observations, patient history, and epidemiological information. The expected result is Negative.  Fact Sheet for Patients:  EntrepreneurPulse.com.au  Fact Sheet for Healthcare Providers:  IncredibleEmployment.be  This test is no t yet approved or cleared by the [place] FDA and  has been authorized for detection and/or diagnosis of SARS-CoV-2 by FDA under an Emergency Use Authorization (EUA). This EUA will remain  in effect (meaning this test can be used) for the duration of the COVID-19 declaration under Section 564(b)(1) of the Act, 21 U.S.C.section 360bbb-3(b)(1), unless the authorization is terminated  or revoked sooner.       Influenza A by PCR NEGATIVE NEGATIVE Final   Influenza B by PCR NEGATIVE NEGATIVE Final    Comment: (NOTE) The Xpert Xpress SARS-CoV-2/FLU/RSV plus assay is intended as an aid in the diagnosis of influenza from  Nasopharyngeal swab specimens and should not be used as a sole basis for treatment. Nasal washings and aspirates are unacceptable for Xpert Xpress SARS-CoV-2/FLU/RSV testing.  Fact Sheet for Patients: EntrepreneurPulse.com.au  Fact Sheet for Healthcare Providers: IncredibleEmployment.be  This test is not yet approved or cleared by the [place] FDA and has been authorized for detection and/or diagnosis of SARS-CoV-2 by FDA under an Emergency Use Authorization (EUA). This EUA will remain in effect (meaning this test can be used) for the duration of the COVID-19 declaration under Section 564(b)(1) of the  Act, 21 U.S.C. section 360bbb-3(b)(1), unless the authorization is terminated or revoked.  Performed at [facility], [address], [place], [place] [address]      Radiology Studies: DG Chest 2 View  Result Date: 06/07/2021 CLINICAL DATA:  Short of breath, fatigue. EXAM: CHEST - 2 VIEW COMPARISON:  08/03/2020 FINDINGS: Low lung volumes. Bilateral small effusions. No pulmonary edema. No infiltrate. No acute osseous abnormality. IMPRESSION: Low lung volumes and bilateral small effusions Electronically Signed   By: Suzy Bouchard M.D.   On: 06/07/2021 14:06   ECHOCARDIOGRAM COMPLETE  Result Date: 06/08/2021    ECHOCARDIOGRAM REPORT   Patient Name:   NYAJAH HYSON Date of Exam: 06/08/2021 Medical Rec #:  [id number]       Height:       61.0 in Accession #:    [id number]      Weight:       137.8 lb Date of Birth:  1925/09/19       BSA:          1.612 m Patient Age:    39 years        BP:           115/78 mmHg Patient Gender: F               HR:           73 bpm. Exam Location:  [facility] Procedure: 2D Echo, Color Doppler and Cardiac Doppler Indications:     I50.31 congestive heart failure-Acute Diastolic  History:         Patient has prior history of Echocardiogram examinations, most                  recent 08/03/2020. CHF, CAD; Risk  Factors:Hypertension.  Sonographer:     Charmayne Sheer Referring Phys:  [id number] [place] Diagnosing Phys: [place]  1. Left ventricular ejection fraction, by estimation, is 55 to 60%. The left ventricle has normal function. The left ventricle has no regional wall motion abnormalities. Left ventricular diastolic parameters are indeterminate.  2. Right ventricular systolic function is mildly reduced. The right ventricular size is moderately enlarged. There is moderately elevated pulmonary artery systolic pressure.  3. Right atrial size was mildly dilated.  4. The mitral valve is normal in structure. Mild mitral valve regurgitation. No evidence of mitral stenosis.  5. The aortic valve is normal in structure. Aortic valve regurgitation is not visualized. Aortic valve sclerosis is present, with no evidence of aortic valve stenosis.  6. The inferior vena cava is dilated in size with >50% respiratory variability, suggesting right atrial pressure of 8 mmHg. Conclusion(s)/Recommendation(s): MODERATE PULMONARY HYPERTENSION WITH DILATED RV. FINDINGS  Left Ventricle: Left ventricular ejection fraction, by estimation, is 55 to 60%. The left ventricle has normal function. The left ventricle has no regional wall motion abnormalities. The left ventricular internal cavity size was normal in size. There is  no left ventricular hypertrophy. Left ventricular diastolic parameters are indeterminate. Right Ventricle: The right ventricular size is moderately enlarged. No increase in right ventricular wall thickness. Right ventricular systolic function is mildly reduced. There is moderately elevated pulmonary artery systolic pressure. The tricuspid regurgitant velocity is 3.75 m/s, and with an assumed right atrial pressure of 8 mmHg, the estimated right ventricular systolic pressure is 81.8 mmHg. Left Atrium: Left atrial size was normal in size. Right Atrium: Right atrial size was mildly dilated. Pericardium: There is no evidence  of pericardial effusion. Mitral Valve: The mitral valve  is normal in structure. Mild mitral valve regurgitation. No evidence of mitral valve stenosis. MV peak gradient, 3.4 mmHg. The mean mitral valve gradient is 1.0 mmHg. Tricuspid Valve: The tricuspid valve is normal in structure. Tricuspid valve regurgitation is mild . No evidence of tricuspid stenosis. Aortic Valve: The aortic valve is normal in structure. Aortic valve regurgitation is not visualized. Aortic valve sclerosis is present, with no evidence of aortic valve stenosis. Aortic valve mean gradient measures 3.0 mmHg. Aortic valve peak gradient measures 5.7 mmHg. Aortic valve area, by VTI measures 1.34 cm. Pulmonic Valve: The pulmonic valve was normal in structure. Pulmonic valve regurgitation is not visualized. No evidence of pulmonic stenosis. Aorta: The aortic root is normal in size and structure. Venous: The inferior vena cava is dilated in size with greater than 50% respiratory variability, suggesting right atrial pressure of 8 mmHg. IAS/Shunts: No atrial level shunt detected by color flow Doppler.  LEFT VENTRICLE PLAX 2D LVIDd:         4.11 cm   Diastology LVIDs:         3.01 cm   LV e' medial:    5.77 cm/s LV PW:         0.92 cm   LV E/e' medial:  17.5 LV IVS:        0.91 cm   LV e' lateral:   4.68 cm/s LVOT diam:     1.70 cm   LV E/e' lateral: 21.6 LV SV:         34 LV SV Index:   21 LVOT Area:     2.27 cm  RIGHT VENTRICLE RV Basal diam:  3.73 cm LEFT ATRIUM             Index        RIGHT ATRIUM           Index LA diam:        3.90 cm 2.42 cm/m   RA Area:     19.20 cm LA Vol (A2C):   39.8 ml 24.69 ml/m  RA Volume:   51.50 ml  31.94 ml/m LA Vol (A4C):   40.2 ml 24.93 ml/m LA Biplane Vol: 40.0 ml 24.81 ml/m  AORTIC VALVE                    PULMONIC VALVE AV Area (Vmax):    1.34 cm     PV Vmax:       0.67 m/s AV Area (Vmean):   1.36 cm     PV Vmean:      47.900 cm/s AV Area (VTI):     1.34 cm     PV VTI:        0.116 m AV Vmax:            119.00 cm/s  PV Peak grad:  1.8 mmHg AV Vmean:          80.000 cm/s  PV Mean grad:  1.0 mmHg AV VTI:            0.251 m AV Peak Grad:      5.7 mmHg AV Mean Grad:      3.0 mmHg LVOT Vmax:         70.50 cm/s LVOT Vmean:        48.100 cm/s LVOT VTI:          0.148 m LVOT/AV VTI ratio: 0.59  AORTA Ao Root diam: 2.90 cm MITRAL VALVE  TRICUSPID VALVE MV Area (PHT): 4.08 cm     TR Peak grad:   56.2 mmHg MV Area VTI:   1.34 cm     TR Vmax:        375.00 cm/s MV Peak grad:  3.4 mmHg MV Mean grad:  1.0 mmHg     SHUNTS MV Vmax:       0.93 m/s     Systemic VTI:  0.15 m MV Vmean:      55.8 cm/s    Systemic Diam: 1.70 cm MV Decel Time: 186 msec MV E velocity: 101.00 cm/s MV A velocity: 36.40 cm/s MV E/A ratio:  2.77 Donnelly Angelica Electronically signed by Donnelly Angelica Signature Date/Time: 06/08/2021/12:58:03 PM    Final      LOS: 1 day   Desma Maxim, MD [facility]  06/09/2021, 1:26 PM

## 2021-06-09 NOTE — Plan of Care (Signed)
  Problem: Activity: Goal: Capacity to carry out activities will improve Outcome: Progressing   Problem: Cardiac: Goal: Ability to achieve and maintain adequate cardiopulmonary perfusion will improve Outcome: Progressing   

## 2021-06-10 LAB — CBC
HCT: 26.8 % — ABNORMAL LOW (ref 36.0–46.0)
Hemoglobin: 8.2 g/dL — ABNORMAL LOW (ref 12.0–15.0)
MCH: 23.9 pg — ABNORMAL LOW (ref 26.0–34.0)
MCHC: 30.6 g/dL (ref 30.0–36.0)
MCV: 78.1 fL — ABNORMAL LOW (ref 80.0–100.0)
Platelets: 123 10*3/uL — ABNORMAL LOW (ref 150–400)
RBC: 3.43 MIL/uL — ABNORMAL LOW (ref 3.87–5.11)
RDW: 17.7 % — ABNORMAL HIGH (ref 11.5–15.5)
WBC: 5.3 10*3/uL (ref 4.0–10.5)
nRBC: 0 % (ref 0.0–0.2)

## 2021-06-10 LAB — BASIC METABOLIC PANEL
Anion gap: 9 (ref 5–15)
BUN: 29 mg/dL — ABNORMAL HIGH (ref 8–23)
CO2: 33 mmol/L — ABNORMAL HIGH (ref 22–32)
Calcium: 8.8 mg/dL — ABNORMAL LOW (ref 8.9–10.3)
Chloride: 92 mmol/L — ABNORMAL LOW (ref 98–111)
Creatinine, Ser: 1.19 mg/dL — ABNORMAL HIGH (ref 0.44–1.00)
GFR, Estimated: 43 mL/min — ABNORMAL LOW (ref >60)
Glucose, Bld: 86 mg/dL (ref 70–99)
Potassium: 3.7 mmol/L (ref 3.5–5.1)
Sodium: 134 mmol/L — ABNORMAL LOW (ref 135–145)

## 2021-06-10 LAB — HEPATITIS C ANTIBODY: HCV Ab: NONREACTIVE

## 2021-06-10 LAB — HIV ANTIBODY (ROUTINE TESTING W REFLEX): HIV Screen 4th Generation wRfx: NONREACTIVE

## 2021-06-10 MED ORDER — FUROSEMIDE 20 MG PO TABS: ORAL_TABLET | ORAL | 0 refills | Status: DC

## 2021-06-10 MED ORDER — METOPROLOL SUCCINATE ER 50 MG PO TB24: 50.0000 mg | ORAL_TABLET | Freq: Every day | ORAL | 1 refills | Status: DC

## 2021-06-10 NOTE — TOC Transition Note (Signed)
Transition of Care [facility]) - CM/SW Discharge Note   Patient Details  Name: Allison Novak MRN: [id number] Date of Birth: 12/25/1925  Transition of Care [facility]) CM/SW Contact:  Candie Chroman, LCSW Phone Number: 06/10/2021, 2:13 PM   Clinical Narrative: Patient has orders to discharge home today. [place] representative is aware. Ordered walker through [employer]. No further concerns. CSW signing off.    Final next level of care: [place] Barriers to Discharge: No Barriers Identified   Patient Goals and CMS Choice     Choice offered to / list presented to : Patient, Adult Children  Discharge Placement                    Patient and family notified of of transfer: 06/10/21  Discharge Plan and [place] Choice: Durable Medical Equipment, Home Health          DME Arranged: Walker rolling DME Agency: [employer] Date DME Agency Contacted: 06/10/21   Representative spoke with at DME Agency: Stephannie Peters HH Arranged: RN [place] Agency: [place] ([place]) Date [place]: 06/10/21   Representative spoke with at [place]: Floydene Flock  Social Determinants of Health (SDOH) Interventions     Readmission Risk Interventions No flowsheet data found.

## 2021-06-10 NOTE — Discharge Summary (Addendum)
Allison Novak PZW:[id number] DOB: October 13, 1925 DOA: 06/07/2021  PCP: Adin Hector, MD  Admit date: 06/07/2021 Discharge date: 06/10/2021  Time spent: 40 minutes  Recommendations for Outpatient Follow-up:  PCP f/u 1-2 weeks Monitor hemoglobin level at PCP f/u Cardiology f/u 1-2 weeks Heart failure clinic f/u 2/6 as scheduled GI f/u (Dr. Alice Reichert) given worsening anemia    Discharge Diagnoses:  Principal Problem:   Acute on chronic combined systolic and diastolic CHF (congestive heart failure) ([place]) Active Problems:   HTN (hypertension), malignant   TIA (transient ischemic attack)   GERD (gastroesophageal reflux disease)   Hyperlipidemia   Acute exacerbation of CHF (congestive heart failure) (HCC)   Anemia of chronic disease   Permanent atrial fibrillation (HCC)   Hyponatremia   Discharge Condition: stable  Diet recommendation: heart healthy  Filed Weights   06/08/21 1411 06/09/21 0447 06/10/21 0500  Weight: 63.4 kg 64 kg 63.7 kg    History of present illness:  Allison Novak is a 88 y.o. female with medical history significant for permanent A. fib on Eliquis, chronic combined systolic and diastolic CHF last LVEF 42-35%T6 DD ( 08/03/2020) on oral Lasix, history of TIA/carotid stenosis/aortic atherosclerosis presented to the ED with complaint of shortness of breath and fatigue symptoms ongoing for several weeks.  Symptoms worse with laying down initially but now at all position and worse with exertion along with dry cough, increased leg swelling and several pound weight gain.  Patient has recently started taking increased dose of Lasix 40 in the morning and 20 in the day, recently seen by her cardiologist and Lasix was being adjusted without improvement in the symptoms.  Hospital Course:  Patient presented with acute exacerbation of chronic diastolic heart failure. She was treated with IV diuresis and improved. Cardiology was consulted. She will be discharged on home dose of  lasix; we are holding losartan and decreasing metoprolol due to hypotension. Patient has a history of anemia that was evaluated by GI in 2021. Endoluminal evaluation was deferred at that time. Here patient's hemoglobin is stable in the 8s which is a decrease from her prior baseline. No hematochezia or melena. This anemia likely contributes to her symptomatology. I've advised GI follow-up as they may now wish to pursue endoluminal evaluation. I also advise attention to hemoglobin at PCP f/u to ensure it is stable. After discussion of risks/benefits given hemoglobin stability and lack of clear bleeding shared decision to continue home eliquis. Finally patient presented with hyponatremia secondary to her chf exacerbation. This resolved with diuresis.   Procedures: none   Consultations: cardiology  Discharge Exam: Vitals:   06/10/21 1000 06/10/21 1146  BP: (!) 94/57 101/71  Pulse: 88 89  Resp: 18   Temp:  (!) 97.5 F (36.4 C)  SpO2: 93% 95%    General exam: Aaox3, comfortable pleasant HEENT:Oral mucosa moist,  Respiratory system: B/l diminished BS, no use of accessory muscle, Cardiovascular system: S1 & S2 +,No JVD. Gastrointestinal system: Abdomen soft, NT,ND, BS+. Nervous System:Alert, awake, moving extremities. Extremities: edema 1+ b/l LE, distal peripheral pulses palpable.  Skin: No rashes, no icterus. MSK: Normal muscle bulk, tone, power.  Discharge Instructions   Discharge Instructions     Diet - low sodium heart healthy   Complete by: As directed    Increase activity slowly   Complete by: As directed       Allergies as of 06/10/2021       Reactions   Keflex [cephalexin] Other (See Comments)  Pt cant remember exact reaction   Macrodantin [nitrofurantoin Macrocrystal] Other (See Comments)        Medication List     STOP taking these medications    diltiazem 120 MG 24 hr capsule Commonly known as: CARDIZEM CD   losartan 100 MG tablet Commonly known as:  COZAAR       TAKE these medications    allopurinol 100 MG tablet Commonly known as: ZYLOPRIM Take 100 mg by mouth daily.   atorvastatin 40 MG tablet Commonly known as: LIPITOR Take 40 mg by mouth daily.   azelastine 0.1 % nasal spray Commonly known as: ASTELIN Place 1 spray into both nostrils daily.   calcium carbonate 1500 (600 Ca) MG Tabs tablet Commonly known as: OSCAL Take 1 tablet by mouth in the morning and at bedtime.   Cholecalciferol 50 MCG (2000 UT) Caps Take 2,000 Units by mouth daily.   Eliquis 2.5 MG Tabs tablet Generic drug: apixaban Take 2.5 mg by mouth 2 (two) times daily.   furosemide 20 MG tablet Commonly known as: Lasix 2 tabs every morning and 1 tab every evening What changed:  how much to take how to take this when to take this additional instructions   metoprolol succinate 50 MG 24 hr tablet Commonly known as: TOPROL-XL Take 1 tablet (50 mg total) by mouth daily. Take with or immediately following a meal. Start taking on: June 11, 2021 What changed:  medication strength how much to take   omeprazole 40 MG capsule Commonly known as: PRILOSEC Take 40 mg by mouth daily.   oxybutynin 5 MG tablet Commonly known as: DITROPAN Take 5 mg by mouth at bedtime.   potassium chloride 10 MEQ tablet Commonly known as: KLOR-CON Take 1 tablet (10 mEq total) by mouth daily.               Durable Medical Equipment  (From admission, onward)           Start     Ordered   06/10/21 1404  DME Walker  Once       Question Answer Comment  Walker: With 5 Inch Wheels   Patient needs a walker to treat with the following condition Congestive heart failure (CHF) ([place])      06/10/21 1405           Allergies  Allergen Reactions   Keflex [Cephalexin] Other (See Comments)    Pt cant remember exact reaction   Macrodantin [Nitrofurantoin Macrocrystal] Other (See Comments)    Follow-up Information     [facility]  Follow [facility].   Specialty: Home Health Services Why: They will follow up with you for your home health needs: Nursing.        Alice Reichert, Benay Pike, MD Follow up.   Specialty: Gastroenterology Contact information: [place] [address] [phone]         Andrez Grime, MD Follow up.   Specialty: Cardiology Contact information: [place] [address] [phone]         Adin Hector, MD Follow up.   Specialty: Internal Medicine Contact information: [place] [address] [phone]                  The results of significant diagnostics from this hospitalization (including imaging, microbiology, ancillary and laboratory) are listed below for reference.    Significant Diagnostic Studies: DG Chest 2 View  Result Date:  06/07/2021 CLINICAL DATA:  Short of breath, fatigue. EXAM: CHEST - 2 VIEW COMPARISON:  08/03/2020 FINDINGS: Low lung volumes. Bilateral small effusions. No pulmonary edema. No infiltrate. No acute osseous abnormality. IMPRESSION: Low lung volumes and bilateral small effusions Electronically Signed   By: Suzy Bouchard M.D.   On: 06/07/2021 14:06   ECHOCARDIOGRAM COMPLETE  Result Date: 06/08/2021    ECHOCARDIOGRAM REPORT   Patient Name:   Allison Novak Date of Exam: 06/08/2021 Medical Rec #:  [id number]       Height:       61.0 in Accession #:    [id number]      Weight:       137.8 lb Date of Birth:  [birth year]       BSA:          1.612 m Patient Age:    32 years        BP:           115/78 mmHg Patient Gender: F               HR:           73 bpm. Exam Location:  [facility] Procedure: 2D Echo, Color Doppler and Cardiac Doppler Indications:     I50.31 congestive heart failure-Acute Diastolic  History:         Patient has prior history of Echocardiogram examinations, most                  recent 08/03/2020. CHF, CAD; Risk Factors:Hypertension.  Sonographer:     Charmayne Sheer Referring Phys:  [id number] [place] Diagnosing Phys: [place]  1. Left ventricular ejection fraction, by estimation, is 55 to 60%. The left ventricle has normal function. The left ventricle has no regional wall motion abnormalities. Left ventricular diastolic parameters are indeterminate.  2. Right ventricular systolic function is mildly reduced. The right ventricular size is moderately enlarged. There is moderately elevated pulmonary artery systolic pressure.  3. Right atrial size was mildly dilated.  4. The mitral valve is normal in structure. Mild mitral valve regurgitation. No evidence of mitral stenosis.  5. The aortic valve is normal in structure. Aortic valve regurgitation is not visualized. Aortic valve sclerosis is present, with no evidence of aortic valve stenosis.  6. The inferior vena cava is dilated in size with >50% respiratory variability, suggesting right atrial pressure of 8 mmHg. Conclusion(s)/Recommendation(s): MODERATE PULMONARY HYPERTENSION WITH DILATED RV. FINDINGS  Left Ventricle: Left ventricular ejection fraction, by estimation, is 55 to 60%. The left ventricle has normal function. The left ventricle has no regional wall motion abnormalities. The left ventricular internal cavity size was normal in size. There is  no left ventricular hypertrophy. Left ventricular diastolic parameters are indeterminate. Right Ventricle: The right ventricular size is moderately enlarged. No increase in right ventricular wall thickness. Right ventricular systolic function is mildly reduced. There is moderately elevated pulmonary artery systolic pressure. The tricuspid regurgitant velocity is 3.75 m/s, and with an assumed right atrial pressure of 8 mmHg, the estimated right ventricular systolic pressure is 51.8 mmHg. Left Atrium: Left atrial size was normal in size. Right Atrium: Right atrial size was mildly dilated. Pericardium: There is no evidence of pericardial effusion. Mitral Valve: The  mitral valve is normal in structure. Mild mitral valve regurgitation. No evidence of mitral valve stenosis. MV peak gradient, 3.4 mmHg. The mean mitral valve gradient is 1.0 mmHg. Tricuspid Valve: The tricuspid valve is normal in structure. Tricuspid valve regurgitation  is mild . No evidence of tricuspid stenosis. Aortic Valve: The aortic valve is normal in structure. Aortic valve regurgitation is not visualized. Aortic valve sclerosis is present, with no evidence of aortic valve stenosis. Aortic valve mean gradient measures 3.0 mmHg. Aortic valve peak gradient measures 5.7 mmHg. Aortic valve area, by VTI measures 1.34 cm. Pulmonic Valve: The pulmonic valve was normal in structure. Pulmonic valve regurgitation is not visualized. No evidence of pulmonic stenosis. Aorta: The aortic root is normal in size and structure. Venous: The inferior vena cava is dilated in size with greater than 50% respiratory variability, suggesting right atrial pressure of 8 mmHg. IAS/Shunts: No atrial level shunt detected by color flow Doppler.  LEFT VENTRICLE PLAX 2D LVIDd:         4.11 cm   Diastology LVIDs:         3.01 cm   LV e' medial:    5.77 cm/s LV PW:         0.92 cm   LV E/e' medial:  17.5 LV IVS:        0.91 cm   LV e' lateral:   4.68 cm/s LVOT diam:     1.70 cm   LV E/e' lateral: 21.6 LV SV:         34 LV SV Index:   21 LVOT Area:     2.27 cm  RIGHT VENTRICLE RV Basal diam:  3.73 cm LEFT ATRIUM             Index        RIGHT ATRIUM           Index LA diam:        3.90 cm 2.42 cm/m   RA Area:     19.20 cm LA Vol (A2C):   39.8 ml 24.69 ml/m  RA Volume:   51.50 ml  31.94 ml/m LA Vol (A4C):   40.2 ml 24.93 ml/m LA Biplane Vol: 40.0 ml 24.81 ml/m  AORTIC VALVE                    PULMONIC VALVE AV Area (Vmax):    1.34 cm     PV Vmax:       0.67 m/s AV Area (Vmean):   1.36 cm     PV Vmean:      47.900 cm/s AV Area (VTI):     1.34 cm     PV VTI:        0.116 m AV Vmax:           119.00 cm/s  PV Peak grad:  1.8 mmHg AV Vmean:           80.000 cm/s  PV Mean grad:  1.0 mmHg AV VTI:            0.251 m AV Peak Grad:      5.7 mmHg AV Mean Grad:      3.0 mmHg LVOT Vmax:         70.50 cm/s LVOT Vmean:        48.100 cm/s LVOT VTI:          0.148 m LVOT/AV VTI ratio: 0.59  AORTA Ao Root diam: 2.90 cm MITRAL VALVE                TRICUSPID VALVE MV Area (PHT): 4.08 cm     TR Peak grad:   56.2 mmHg MV Area VTI:   1.34 cm     TR  Vmax:        375.00 cm/s MV Peak grad:  3.4 mmHg MV Mean grad:  1.0 mmHg     SHUNTS MV Vmax:       0.93 m/s     Systemic VTI:  0.15 m MV Vmean:      55.8 cm/s    Systemic Diam: 1.70 cm MV Decel Time: 186 msec MV E velocity: 101.00 cm/s MV A velocity: 36.40 cm/s MV E/A ratio:  2.77 Donnelly Angelica Electronically signed by Donnelly Angelica Signature Date/Time: 06/08/2021/12:58:03 PM    Final     Microbiology: Recent Results (from the past 240 hour(s))  Resp Panel by RT-PCR (Flu A&B, Covid) Nasopharyngeal Swab     Status: None   Collection Time: 06/07/21  4:56 PM   Specimen: Nasopharyngeal Swab; Nasopharyngeal(NP) swabs in vial transport medium  Result Value Ref Range Status   SARS Coronavirus 2 by RT PCR NEGATIVE NEGATIVE Final    Comment: (NOTE) SARS-CoV-2 target nucleic acids are NOT DETECTED.  The SARS-CoV-2 RNA is generally detectable in upper respiratory specimens during the acute phase of infection. The lowest concentration of SARS-CoV-2 viral copies this assay can detect is 138 copies/mL. A negative result does not preclude SARS-Cov-2 infection and should not be used as the sole basis for treatment or other patient management decisions. A negative result may occur with  improper specimen collection/handling, submission of specimen other than nasopharyngeal swab, presence of viral mutation(s) within the areas targeted by this assay, and inadequate number of viral copies(<138 copies/mL). A negative result must be combined with clinical observations, patient history, and epidemiological information. The expected  result is Negative.  Fact Sheet for Patients:  EntrepreneurPulse.com.au  Fact Sheet for Healthcare Providers:  IncredibleEmployment.be  This test is no t yet approved or cleared by the [place] FDA and  has been authorized for detection and/or diagnosis of SARS-CoV-2 by FDA under an Emergency Use Authorization (EUA). This EUA will remain  in effect (meaning this test can be used) for the duration of the COVID-19 declaration under Section 564(b)(1) of the Act, 21 U.S.C.section 360bbb-3(b)(1), unless the authorization is terminated  or revoked sooner.       Influenza A by PCR NEGATIVE NEGATIVE Final   Influenza B by PCR NEGATIVE NEGATIVE Final    Comment: (NOTE) The Xpert Xpress SARS-CoV-2/FLU/RSV plus assay is intended as an aid in the diagnosis of influenza from Nasopharyngeal swab specimens and should not be used as a sole basis for treatment. Nasal washings and aspirates are unacceptable for Xpert Xpress SARS-CoV-2/FLU/RSV testing.  Fact Sheet for Patients: EntrepreneurPulse.com.au  Fact Sheet for Healthcare Providers: IncredibleEmployment.be  This test is not yet approved or cleared by the [place] FDA and has been authorized for detection and/or diagnosis of SARS-CoV-2 by FDA under an Emergency Use Authorization (EUA). This EUA will remain in effect (meaning this test can be used) for the duration of the COVID-19 declaration under Section 564(b)(1) of the Act, 21 U.S.C. section 360bbb-3(b)(1), unless the authorization is terminated or revoked.  Performed at [facility], [address] [place], [place] [address]      Labs: Basic Metabolic Panel: Recent Labs  Lab 06/07/21 1328 06/08/21 0635 06/09/21 0412 06/10/21 0614  NA 127* 131* 130* 134*  K 4.2 3.5 4.4 3.7  CL 89* 90* 89* 92*  CO2 30 30 31  33*  GLUCOSE 74 65* 73 86  BUN 27* 25* 24* 29*  CREATININE 1.14* 1.01*  1.07* 1.19*  CALCIUM 9.0  8.7* 8.9 8.8*   Liver Function Tests: No results for input(s): AST, ALT, ALKPHOS, BILITOT, PROT, ALBUMIN in the last 168 hours. No results for input(s): LIPASE, AMYLASE in the last 168 hours. No results for input(s): AMMONIA in the last 168 hours. CBC: Recent Labs  Lab 06/07/21 1328 06/08/21 0635 06/09/21 0412 06/10/21 0614  WBC 5.5 4.9 5.8 5.3  HGB 8.6* 9.0* 8.8* 8.2*  HCT 28.8* 29.2* 27.8* 26.8*  MCV 80.4 79.6* 78.5* 78.1*  PLT 124* 114* 134* 123*   Cardiac Enzymes: No results for input(s): CKTOTAL, CKMB, CKMBINDEX, TROPONINI in the last 168 hours. BNP: BNP (last 3 results) Recent Labs    04/12/21 1249 05/14/21 1453 06/07/21 1328  BNP 571.8* 868.4* 585.5*    ProBNP (last 3 results) No results for input(s): PROBNP in the last 8760 hours.  CBG: Recent Labs  Lab 06/08/21 0841  GLUCAP 76       Signed:  Desma Maxim MD.  [facility] 06/10/2021, 2:21 PM

## 2021-06-10 NOTE — Progress Notes (Signed)
During patient assessment at the start of shift, no needs expressed regarding pain. At 2:00 am, patient expresses pain in feet bilaterally.  Visible indentation in both ankles wh. She has some relief but still mentions slight pain. Patient administered Tylenol 650 mg.  Patient expresses interest in knowing more about having an Advanced Directive in place, and in going from a FULL code to a DNR. She would like to talk to the doctor about what her options are.

## 2021-06-10 NOTE — TOC Initial Note (Signed)
Transition of Care [facility]) - Initial/Assessment Note    Patient Details  Name: Allison Novak MRN: [id number] Date of Birth: 1926-03-10  [facility]) CM/SW Contact:    Candie Chroman, LCSW Phone Number: 06/10/2021, 12:02 PM  Clinical Narrative:  CSW met with patient. Family at bedside. CSW introduced role and explained that discharge planning would be discussed. Patient and family are agreeable to a [place]. [facility] is first preference because she has worked with them before. Referral has been accepted. Patient requesting a new RW because hers is very old. Asked MD for DME order. Per OT, patient/family asking about a pulse ox. Family said they bought her one from [facility] but they think it reads incorrectly. Per tech, it may be because her fingers are cold at the time. Patient will continue with current pulse ox or buy a new one. No further concerns. CSW encouraged patient and her family to contact CSW as needed. CSW will continue to follow patient and her family for support and facilitate return home when stable.                Expected Discharge Plan: [place] Barriers to Discharge: Continued Medical Work up   Patient Goals and CMS Choice     Choice offered to / list presented to : Patient  Expected Discharge Plan and Services Expected Discharge Plan: [place] Acute Care Choice: Durable Medical Equipment, Home Health Living arrangements for the past 2 months: [place]: RN [facility] Agency: [place] ([place]) Date [place]: 06/10/21   Representative spoke with at [place]: Floydene Flock  Prior Living Arrangements/Services Living arrangements for the past 2 months: [place] Lives with:: Self Patient language and need for interpreter reviewed:: Yes Do you feel safe going back to the place where you live?: Yes      Need for Family Participation in  Patient Care: Yes (Comment) Care giver support system in place?: Yes (comment)   Criminal Activity/Legal Involvement Pertinent to Current Situation/Hospitalization: No - Comment as needed  Activities of Daily Living Home Assistive Devices/Equipment: Environmental consultant (specify type), Cane (specify quad or straight), Bedside commode/3-in-1, Wheelchair, Reacher, Long-handled shoehorn ADL Screening (condition at time of admission) Patient's cognitive ability adequate to safely complete daily activities?: Yes Is the patient deaf or have difficulty hearing?: Yes Does the patient have difficulty seeing, even when wearing glasses/contacts?: No Does the patient have difficulty concentrating, remembering, or making decisions?: No Patient able to express need for assistance with ADLs?: Yes Does the patient have difficulty dressing or bathing?: No Independently performs ADLs?: Yes (appropriate for developmental age) Does the patient have difficulty walking or climbing stairs?: Yes Weakness of Legs: Both Weakness of Arms/Hands: Both  Permission Sought/Granted Permission sought to share information with : Facility Sport and exercise psychologist, Family Supports Permission granted to share information with : Yes, Verbal Permission Granted     Permission granted to share info w AGENCY: [place] granted to share info w Relationship: Family at bedside     Emotional Assessment Appearance:: Appears stated age Attitude/Demeanor/Rapport: Engaged, Gracious Affect (typically observed): Accepting, Appropriate, Calm, Pleasant Orientation: : Oriented to Self, Oriented to Place, Oriented to  Time, Oriented to Situation Alcohol / Substance Use: Not Applicable Psych  Involvement: No (comment)  Admission diagnosis:  Hyponatremia [E87.1] Acute on chronic combined systolic and diastolic congestive heart failure (HCC) [I50.43] Acute on chronic combined systolic and diastolic CHF (congestive heart failure)  ([place]) [I50.43] Patient Active Problem List   Diagnosis Date Noted   Anemia of chronic disease 06/07/2021   Permanent atrial fibrillation ([place]) 06/07/2021   Hyponatremia 06/07/2021   Acute on chronic combined systolic and diastolic CHF (congestive heart failure) ([place]) 06/07/2021   Atrial fibrillation, chronic ([place]) 08/04/2020   Acute exacerbation of CHF (congestive heart failure) ([place]) 08/04/2020   Shortness of breath 08/03/2020   Hypokalemia    Palpitations    Hyperlipidemia 02/16/2020   Atrial fibrillation with RVR ([place]) 02/16/2020   Hypoxia 02/16/2020   Acute CHF (congestive heart failure) ([place]) 02/16/2020   Chronic anticoagulation 02/16/2020   History of TIA (transient ischemic attack) 02/23/2017   Carotid artery disease ([place]) 02/23/2017   TIA (transient ischemic attack) 02/18/2017   GERD (gastroesophageal reflux disease) 02/18/2017   Chest pain 06/23/2015   HTN (hypertension), malignant 06/23/2015   Essential hypertension 06/16/2015   Aortic atherosclerosis ([place]) 06/16/2015   PCP:  Adin Hector, MD Pharmacy:   [facility] [place], [place] - [address] [place] [address] Phone: [phone] Fax: [place] #[id number] Lorina Rabon, [place] AT [facility] [place] [address] Phone: [phone] Fax: [phone]     Social Determinants of Health (SDOH) Interventions    Readmission Risk Interventions No flowsheet data found.

## 2021-06-10 NOTE — Progress Notes (Signed)
[place] NOTE       Patient ID: Allison Novak MRN: [id number] DOB/AGE: [birth year] 88 y.o.  Admit date: 06/07/2021 Referring Physician Dr. Antonieta Pert Primary Physician Dr. Ramonita Lab Primary Cardiologist Dr. Donnelly Angelica Reason for Consultation AoCHF  HPI: The patient is a 88 year old female with a past medical history significant for HFpEF (06/08/21 LVEF 55-60%, dilated RV and moderate pulm HTN, mild MR), persistent atrial fibrillation on dose reduced Eliquis, familial hypercholesterolemia, bilateral carotid artery disease, aortic atherosclerosis, history of TIA 2018, CKD 3 who presented to [facility] ED 06/07/2021 with a several week history of shortness of breath and fatigue.  Cardiology is consulted for assistance with diuresis  Interval History: -per family, pt had some more confusion overnight.  -weight decreased about 0.5lb since yesterday, no output recorded -BP remains about 111/67. Was able to ambulate in the halls last night and this morning, much improved since prior to admission  Review of systems complete and found to be negative unless listed above   Past Medical History:  Diagnosis Date   A-fib ([place])    CHF (congestive heart failure) ([place])    Coronary artery disease    Diverticulosis    GERD (gastroesophageal reflux disease)    Gout    Hypertension    Osteoporosis     Past Surgical History:  Procedure Laterality Date   ABDOMINAL HYSTERECTOMY     ANKLE SURGERY Left 2011   KNEE ARTHROSCOPY Right     Medications Prior to Admission  Medication Sig Dispense Refill Last Dose   allopurinol (ZYLOPRIM) 100 MG tablet Take 100 mg by mouth daily.   06/07/2021   atorvastatin (LIPITOR) 40 MG tablet Take 40 mg by mouth daily.  2 06/06/2021   calcium carbonate (OSCAL) 1500 (600 Ca) MG TABS tablet Take 1 tablet by mouth in the morning and at bedtime.   06/07/2021   Cholecalciferol 50 MCG (2000 UT) CAPS Take 2,000 Units by mouth daily.   06/07/2021   ELIQUIS 2.5  MG TABS tablet Take 2.5 mg by mouth 2 (two) times daily.   06/07/2021   furosemide (LASIX) 20 MG tablet Take 2 tablets (40 mg total) by mouth daily. 60 tablet 0 06/07/2021   losartan (COZAAR) 100 MG tablet Take 1 tablet (100 mg total) by mouth daily. 30 tablet 0 06/07/2021   metoprolol succinate (TOPROL-XL) 200 MG 24 hr tablet Take 1 tablet (200 mg total) by mouth daily. Take with or immediately following a meal. 30 tablet 0 06/07/2021   omeprazole (PRILOSEC) 40 MG capsule Take 40 mg by mouth daily.    06/07/2021   oxybutynin (DITROPAN) 5 MG tablet Take 5 mg by mouth at bedtime.   06/06/2021   potassium chloride (KLOR-CON) 10 MEQ tablet Take 1 tablet (10 mEq total) by mouth daily. 30 tablet 0 06/07/2021   azelastine (ASTELIN) 0.1 % nasal spray Place 1 spray into both nostrils daily.   prn at unknown   diltiazem (CARDIZEM CD) 120 MG 24 hr capsule Take 120 mg by mouth daily.       Social History   Socioeconomic History   Marital status: Widowed    Spouse name: Not on file   Number of children: Not on file   Years of education: Not on file   Highest education level: Not on file  Occupational History   Not on file  Tobacco Use   Smoking status: Never   Smokeless tobacco: Never  Substance and Sexual Activity   Alcohol use:  No    Alcohol/week: 0.0 standard drinks   Drug use: No   Sexual activity: Never  Other Topics Concern   Not on file  Social History Narrative   Not on file   Social Determinants of Health   Financial Resource Strain: Not on file  Food Insecurity: Not on file  Transportation Needs: Not on file  Physical Activity: Not on file  Stress: Not on file  Social Connections: Not on file  Intimate Partner Violence: Not on file    Family History  Problem Relation Age of Onset   CAD Mother    Hypertension Sister       Review of systems complete and found to be negative unless listed above    PHYSICAL EXAM General: Pleasant elderly caucasian female , well nourished, in  no acute distress. Sitting upright with legs propped up in recliner, daughter and son at bedside HEENT:  Normocephalic and atraumatic. Neck:  No JVD.  Lungs: Normal respiratory effort on room air. Poor air movement. Clear to ascultation. Heart: Irregular rhythm and RR . Normal S1 and S2 without gallops or murmurs. Radial pulses 2+ bilaterally. Abdomen: Non-distended appearing.  Msk: Normal strength and tone for age. Extremities: Warm and well perfused. No clubbing, cyanosis. Bilateral 1+ pitting edema with associated chronic venous stasis skin changes.  Neuro: Alert and oriented X 3. Psych:  Answers questions appropriately.   Labs:   Lab Results  Component Value Date   WBC 5.3 06/10/2021   HGB 8.2 (L) 06/10/2021   HCT 26.8 (L) 06/10/2021   MCV 78.1 (L) 06/10/2021   PLT 123 (L) 06/10/2021    Recent Labs  Lab 06/10/21 0614  NA 134*  K 3.7  CL 92*  CO2 33*  BUN 29*  CREATININE 1.19*  CALCIUM 8.8*  GLUCOSE 86    Lab Results  Component Value Date   TROPONINI <0.03 02/18/2017     Lab Results  Component Value Date   CHOL 186 02/19/2017   Lab Results  Component Value Date   HDL 50 02/19/2017   Lab Results  Component Value Date   LDLCALC 112 (H) 02/19/2017   Lab Results  Component Value Date   TRIG 118 02/19/2017   Lab Results  Component Value Date   CHOLHDL 3.7 02/19/2017   No results found for: LDLDIRECT    Radiology: DG Chest 2 View  Result Date: 06/07/2021 CLINICAL DATA:  Short of breath, fatigue. EXAM: CHEST - 2 VIEW COMPARISON:  08/03/2020 FINDINGS: Low lung volumes. Bilateral small effusions. No pulmonary edema. No infiltrate. No acute osseous abnormality. IMPRESSION: Low lung volumes and bilateral small effusions Electronically Signed   By: Suzy Bouchard M.D.   On: 06/07/2021 14:06   ECHOCARDIOGRAM COMPLETE  Result Date: 06/08/2021    ECHOCARDIOGRAM REPORT   Patient Name:   Allison Novak Date of Exam: 06/08/2021 Medical Rec #:  [id number]        Height:       61.0 in Accession #:    [id number]      Weight:       137.8 lb Date of Birth:  [birth year]       BSA:          1.612 m Patient Age:    89 years        BP:           115/78 mmHg Patient Gender: F               HR:  73 bpm. Exam Location:  [facility] Procedure: 2D Echo, Color Doppler and Cardiac Doppler Indications:     I50.31 congestive heart failure-Acute Diastolic  History:         Patient has prior history of Echocardiogram examinations, most                  recent 08/03/2020. CHF, CAD; Risk Factors:Hypertension.  Sonographer:     Charmayne Sheer Referring Phys:  [id number] [place] Diagnosing Phys: [place]  1. Left ventricular ejection fraction, by estimation, is 55 to 60%. The left ventricle has normal function. The left ventricle has no regional wall motion abnormalities. Left ventricular diastolic parameters are indeterminate.  2. Right ventricular systolic function is mildly reduced. The right ventricular size is moderately enlarged. There is moderately elevated pulmonary artery systolic pressure.  3. Right atrial size was mildly dilated.  4. The mitral valve is normal in structure. Mild mitral valve regurgitation. No evidence of mitral stenosis.  5. The aortic valve is normal in structure. Aortic valve regurgitation is not visualized. Aortic valve sclerosis is present, with no evidence of aortic valve stenosis.  6. The inferior vena cava is dilated in size with >50% respiratory variability, suggesting right atrial pressure of 8 mmHg. Conclusion(s)/Recommendation(s): MODERATE PULMONARY HYPERTENSION WITH DILATED RV. FINDINGS  Left Ventricle: Left ventricular ejection fraction, by estimation, is 55 to 60%. The left ventricle has normal function. The left ventricle has no regional wall motion abnormalities. The left ventricular internal cavity size was normal in size. There is  no left ventricular hypertrophy. Left ventricular diastolic parameters are indeterminate. Right Ventricle: The  right ventricular size is moderately enlarged. No increase in right ventricular wall thickness. Right ventricular systolic function is mildly reduced. There is moderately elevated pulmonary artery systolic pressure. The tricuspid regurgitant velocity is 3.75 m/s, and with an assumed right atrial pressure of 8 mmHg, the estimated right ventricular systolic pressure is 86.7 mmHg. Left Atrium: Left atrial size was normal in size. Right Atrium: Right atrial size was mildly dilated. Pericardium: There is no evidence of pericardial effusion. Mitral Valve: The mitral valve is normal in structure. Mild mitral valve regurgitation. No evidence of mitral valve stenosis. MV peak gradient, 3.4 mmHg. The mean mitral valve gradient is 1.0 mmHg. Tricuspid Valve: The tricuspid valve is normal in structure. Tricuspid valve regurgitation is mild . No evidence of tricuspid stenosis. Aortic Valve: The aortic valve is normal in structure. Aortic valve regurgitation is not visualized. Aortic valve sclerosis is present, with no evidence of aortic valve stenosis. Aortic valve mean gradient measures 3.0 mmHg. Aortic valve peak gradient measures 5.7 mmHg. Aortic valve area, by VTI measures 1.34 cm. Pulmonic Valve: The pulmonic valve was normal in structure. Pulmonic valve regurgitation is not visualized. No evidence of pulmonic stenosis. Aorta: The aortic root is normal in size and structure. Venous: The inferior vena cava is dilated in size with greater than 50% respiratory variability, suggesting right atrial pressure of 8 mmHg. IAS/Shunts: No atrial level shunt detected by color flow Doppler.  LEFT VENTRICLE PLAX 2D LVIDd:         4.11 cm   Diastology LVIDs:         3.01 cm   LV e' medial:    5.77 cm/s LV PW:         0.92 cm   LV E/e' medial:  17.5 LV IVS:        0.91 cm   LV e' lateral:   4.68 cm/s LVOT  diam:     1.70 cm   LV E/e' lateral: 21.6 LV SV:         34 LV SV Index:   21 LVOT Area:     2.27 cm  RIGHT VENTRICLE RV Basal diam:   3.73 cm LEFT ATRIUM             Index        RIGHT ATRIUM           Index LA diam:        3.90 cm 2.42 cm/m   RA Area:     19.20 cm LA Vol (A2C):   39.8 ml 24.69 ml/m  RA Volume:   51.50 ml  31.94 ml/m LA Vol (A4C):   40.2 ml 24.93 ml/m LA Biplane Vol: 40.0 ml 24.81 ml/m  AORTIC VALVE                    PULMONIC VALVE AV Area (Vmax):    1.34 cm     PV Vmax:       0.67 m/s AV Area (Vmean):   1.36 cm     PV Vmean:      47.900 cm/s AV Area (VTI):     1.34 cm     PV VTI:        0.116 m AV Vmax:           119.00 cm/s  PV Peak grad:  1.8 mmHg AV Vmean:          80.000 cm/s  PV Mean grad:  1.0 mmHg AV VTI:            0.251 m AV Peak Grad:      5.7 mmHg AV Mean Grad:      3.0 mmHg LVOT Vmax:         70.50 cm/s LVOT Vmean:        48.100 cm/s LVOT VTI:          0.148 m LVOT/AV VTI ratio: 0.59  AORTA Ao Root diam: 2.90 cm MITRAL VALVE                TRICUSPID VALVE MV Area (PHT): 4.08 cm     TR Peak grad:   56.2 mmHg MV Area VTI:   1.34 cm     TR Vmax:        375.00 cm/s MV Peak grad:  3.4 mmHg MV Mean grad:  1.0 mmHg     SHUNTS MV Vmax:       0.93 m/s     Systemic VTI:  0.15 m MV Vmean:      55.8 cm/s    Systemic Diam: 1.70 cm MV Decel Time: 186 msec MV E velocity: 101.00 cm/s MV A velocity: 36.40 cm/s MV E/A ratio:  2.77 Donnelly Angelica Electronically signed by Donnelly Angelica Signature Date/Time: 06/08/2021/12:58:03 PM    Final     ECHO LVEF 55-60%, dilated RV and moderate pulm HTN, mild MR)  TELEMETRY reviewed by me: Afib rate 97  EKG reviewed by me: Afib rate 64, RVH  ASSESSMENT AND PLAN:  The patient is a 88 year old female with a past medical history significant for HFpEF (06/08/21 LVEF 55-60%, dilated RV and moderate pulm HTN, mild MR), persistent atrial fibrillation on dose reduced Eliquis, familial hypercholesterolemia, bilateral carotid artery disease, aortic atherosclerosis, history of TIA 2018, CKD 3 who presented to [facility] ED 06/07/2021 with a several week history of shortness of breath and fatigue.  Cardiology is consulted for assistance with diuresis  #Acute on chronic HFpEF (LVEF 55-60%, dilated RV and moderate pulm HTN, mild MR) Currently presenting as a NYHA class II. BNP 585 on admission. Volume status has improved with diuresis clinically, and the patient feels better and is able to performe more of her ADLS/ambulate.  -s/p IV lasix 60x1 IV lasix 40mg  BID x 2 days. Stopped this morning d/t contraction alkalosis developing and bump in Cr. Please discharge on lasix 40 PO in the morning and 20mg  in the evening.  -continue holding losartan at discharge -reduce metop succinate to 25mg  QD as her BP has been soft.  -Home GDMT includes metoprolol succinate 200 mg, losartan 50 mg and Lasix 40 mg a.m./20 mg p.m. -Encourage ambulation and likely ok for discharge to home today from a cardiac standpoint if she continues ambulate without difficulty and home health is arranged. She will need follow up in office with Dr. Corky Sox 1 week after discharge.   #anemia HgB 8.6-9.0-8.2 baseline from 3/31 was 11.3. - Iron was low at 26 and TIBC up at 501.  -agree with close GI follow-up  #Persistent atrial fibrillation Currently rate controlled on metoprolol succinate 50mg  daily (home dose 200 as above) and anticoagulated with dose reduced Eliquis 2.5 mg twice daily for stroke risk reduction -CHA2DS2-VASc 6  #CKD 3a -renal function bumped overnight to cr 1.19 and gfr (Baseline from 07/2020 cr 1.17/ gfr 44) -plan as above  #Familial hypercholesterolemia #hypertension -Continue high intensity atorvastatin 40 mg -other plan as above  This patient's plan of care was discussed and created with Dr. Donnelly Angelica and he is in agreement.  Signed: Tristan Schroeder , PA-C 06/10/2021, 10:09 AM [facility] Cardiology Please [place] 7a-4p M-F.

## 2021-06-10 NOTE — Progress Notes (Signed)
Occupational Therapy Treatment Patient Details Name: Allison Novak MRN: [id number] DOB: 12-26-1925 Today's Date: 06/10/2021   History of present illness Patient is a 88 year old female who reports to [facility] with complaints of shortness of breath and fatigue. PMH(+) for permanent A. fib on Eliquis, chronic combined systolic and diastolic CHF last LVEF 97-67%H4 DD ( 08/03/2020) on oral Lasix, history of TIA/carotid stenosis/aortic atherosclerosis.   OT comments  Allison Novak was seen for OT treatment on this date. Upon arrival to room pt seated on toilet, agreeable to tx, family at bedside. Pt requires MIN A standing from low toilet height, SUPERVISION rising from all other standard height surfaces. SUPERVISION hand washing standing sinkside. Instructed in ECS with pt requesting to trial stairs, (reports as fatiguing activitiy). CGA ascend/descend x4 steps using L rail - cues for safety and rest breaks. SpO2 87% on RA with mobility, resolved to 91% on RA standing rest break. Pt making good progress toward goals. Pt continues to benefit from skilled OT services to maximize return to PLOF and minimize risk of future falls, injury, caregiver burden, and readmission. Will continue to follow POC. Discharge recommendation remains appropriate.      Recommendations for follow up therapy are one component of a multi-disciplinary discharge planning process, led by the attending physician.  Recommendations may be updated based on patient status, additional functional criteria and insurance authorization.    Follow Up Recommendations  No OT follow up    Assistance Recommended at Discharge PRN  Patient can return home with the following  A little help with walking and/or transfers;A little help with bathing/dressing/bathroom   Equipment Recommendations  Other (comment) (2WW)    Recommendations for Other Services      Precautions / Restrictions Precautions Precautions: Fall Restrictions Weight Bearing  Restrictions: No       Mobility Bed Mobility               General bed mobility comments: not assessed, pt sitting pre/post session    Transfers Overall transfer level: Needs assistance Equipment used: Rolling walker (2 wheels) Transfers: Sit to/from Stand Sit to Stand: Supervision, Min assist           General transfer comment: MIN A from toilet, SUPERVISION all other surfaces     Balance Overall balance assessment: Mild deficits observed, not formally tested                                         ADL either performed or assessed with clinical judgement   ADL Overall ADL's : Needs assistance/impaired                                       General ADL Comments: MIN A standing from low toilet height, SUPERVISION rising from all other standard height surfaces. SUPERVISION hand washing standing sinkside. Instructed in ECS with pt requesting to trial stairs - reports as fatiguing activitiy).      Cognition Arousal/Alertness: Awake/alert Behavior During Therapy: WFL for tasks assessed/performed Overall Cognitive Status: Within Functional Limits for tasks assessed                                          Exercises Other  Exercises Other Exercises: Ascend/descend x4 steps with L rail and CGA. Cues for safety            Pertinent Vitals/ Pain       Pain Assessment Pain Assessment: No/denies pain   Frequency  Min 1X/week        Progress Toward Goals  OT Goals(current goals can now be found in the care plan section)  Progress towards OT goals: Progressing toward goals  Acute Rehab OT Goals Patient Stated Goal: to go home OT Goal Formulation: With patient/family Time For Goal Achievement: 06/23/21 Potential to Achieve Goals: Good ADL Goals Pt Will Perform Grooming: with modified independence;standing Pt Will Perform Lower Body Dressing: with modified independence;sit to/from stand Pt Will Transfer  to Toilet: with modified independence;ambulating;regular height toilet Additional ADL Goal #1: Pt will independently initiate at least 1 energy conservation strategy during standing ADLs  Plan Discharge plan remains appropriate;Frequency remains appropriate    Co-evaluation                 AM-PAC OT "6 Clicks" Daily Activity     Outcome Measure   Help from another person eating meals?: None Help from another person taking care of personal grooming?: A Little Help from another person toileting, which includes using toliet, bedpan, or urinal?: A Little Help from another person bathing (including washing, rinsing, drying)?: A Little Help from another person to put on and taking off regular upper body clothing?: None Help from another person to put on and taking off regular lower body clothing?: A Little 6 Click Score: 20    End of Session Equipment Utilized During Treatment: Rolling walker (2 wheels)  OT Visit Diagnosis: Unsteadiness on feet (R26.81);History of falling (Z91.81)   Activity Tolerance Patient tolerated treatment well   Patient Left in chair;with call bell/phone within reach;with family/visitor present   Nurse Communication Mobility status        Time: 4174-0814 OT Time Calculation (min): 24 min  Charges: OT General Charges $OT Visit: 1 Visit OT Treatments $Self Care/Home Management : 23-37 mins  Allison Novak, M.S. OTR/L  06/10/21, 9:14 AM  ascom [phone]

## 2021-06-12 NOTE — Progress Notes (Signed)
Patient ID: Allison Novak, female    DOB: 02/24/1926, 88 y.o.   MRN: [id number]  HPI  Allison Novak is a 88 y/o female with a history of CAD, HTN, atrial fibrillation, anemia, CKD, hiatal hernia, PE, GERD, hyperlipidemia, gout, diverticulosis, osteoporosis and chronic heart failure.   Echo report from 02/16/20 reviewed and showed an EF of 45-50% along with mild/moderate MR/TR.   Admitted 06/07/21 due to shortness of breath/ fatigue due to HF exacerbation. Initially given IV lasix with transition to oral diuretics. Cardiology consult obtained. Hyponatremia improved with diuresis. OT evaluation done. Discharged after 3 days.   She presents today for her initial visit with a chief complaint of minimal fatigue upon moderate exertion. She says that this has been chronic in nature. She has associated "memory fog" along with this. She denies any dizziness, difficulty sleeping, abdominal distention, palpitations, pedal edema, chest pain, shortness of breath, cough or weight gain.   She has noticed her memory being "off" and her "head not right" since her recent admission. She admits that she sometimes gets her days/ nights mixed up  Does add some salt but is trying to not add as much  Past Medical History:  Diagnosis Date   A-fib ([place])    Anemia    Carotid artery stenosis    CHF (congestive heart failure) ([place])    Chronic kidney disease    Coronary artery disease    Diverticulosis    GERD (gastroesophageal reflux disease)    Gout    Hiatal hernia    Hyperlipidemia    Hypertension    Osteoporosis    Pulmonary embolus ([place])    Past Surgical History:  Procedure Laterality Date   ABDOMINAL HYSTERECTOMY     ANKLE SURGERY Left 2011   KNEE ARTHROSCOPY Right    Family History  Problem Relation Age of Onset   CAD Mother    Hypertension Sister    Social History   Tobacco Use   Smoking status: Never   Smokeless tobacco: Never  Substance Use Topics   Alcohol use: No    Alcohol/week: 0.0  standard drinks   Allergies  Allergen Reactions   Keflex [Cephalexin] Other (See Comments)    Pt cant remember exact reaction   Macrodantin [Nitrofurantoin Macrocrystal] Other (See Comments)   Prior to Admission medications   Medication Sig Start Date End Date Taking? Authorizing Provider  allopurinol (ZYLOPRIM) 100 MG tablet Take 100 mg by mouth daily.   Yes [provider]  atorvastatin (LIPITOR) 40 MG tablet Take 40 mg by mouth daily. 11/18/16  Yes [provider]  calcium carbonate (OSCAL) 1500 (600 Ca) MG TABS tablet Take 1 tablet by mouth in the morning and at bedtime.   Yes [provider]  Cholecalciferol 50 MCG (2000 UT) CAPS Take 2,000 Units by mouth daily.   Yes [provider]  ELIQUIS 2.5 MG TABS tablet Take 2.5 mg by mouth 2 (two) times daily. 02/13/20  Yes [provider]  furosemide (LASIX) 20 MG tablet 2 tabs every morning and 1 tab every evening 06/10/21  Yes Novak, Allison Rud, MD  metoprolol succinate (TOPROL-XL) 50 MG 24 hr tablet Take 1 tablet (50 mg total) by mouth daily. Take with or immediately following a meal. 06/11/21  Yes Novak, Allison Rud, MD  omeprazole (PRILOSEC) 40 MG capsule Take 40 mg by mouth daily.    Yes [provider]  oxybutynin (DITROPAN) 5 MG tablet Take 5 mg by mouth at bedtime.  Yes [provider]  potassium chloride (KLOR-CON) 10 MEQ tablet Take 1 tablet (10 mEq total) by mouth daily. 02/18/20  Yes Allison Nimrod, MD  azelastine (ASTELIN) 0.1 % nasal spray Place 1 spray into both nostrils daily. 06/15/20   [provider]   Review of Systems  Constitutional:  Positive for fatigue. Negative for appetite change.  HENT:  Negative for congestion, postnasal drip and sore throat.   Eyes: Negative.   Respiratory:  Negative for cough, chest tightness and shortness of breath.   Cardiovascular:  Negative for chest pain, palpitations and leg swelling.  Gastrointestinal:  Negative for  abdominal distention and abdominal pain.  Endocrine: Negative.   Genitourinary: Negative.   Musculoskeletal:  Negative for back pain and neck pain.  Skin: Negative.   Allergic/Immunologic: Negative.   Neurological:  Negative for dizziness and light-headedness.  Hematological:  Negative for adenopathy. Does not bruise/bleed easily.  Psychiatric/Behavioral:  Negative for dysphoric mood and sleep disturbance (sleeping on 1 pillow). The patient is not nervous/anxious.        "Memory fog"   Vitals:   06/14/21 1404  BP: 102/68  Pulse: 83  Resp: 16  SpO2: 92%  Weight: 133 lb 9 oz (60.6 kg)  Height: 5\' 1"  (1.549 m)   Wt Readings from Last 3 Encounters:  06/14/21 133 lb 9 oz (60.6 kg)  06/10/21 140 lb 6.9 oz (63.7 kg)  08/06/20 137 lb 12.8 oz (62.5 kg)   Lab Results  Component Value Date   CREATININE 1.19 (H) 06/10/2021   CREATININE 1.07 (H) 06/09/2021   CREATININE 1.01 (H) 06/08/2021   Physical Exam Vitals and nursing note reviewed. Exam conducted with a chaperone present (son).  Constitutional:      Appearance: Normal appearance.  HENT:     Head: Normocephalic and atraumatic.  Cardiovascular:     Rate and Rhythm: Normal rate. Rhythm irregular.  Pulmonary:     Effort: Pulmonary effort is normal. No respiratory distress.     Breath sounds: No wheezing or rales.  Abdominal:     General: There is no distension.     Palpations: Abdomen is soft.     Tenderness: There is no abdominal tenderness.  Musculoskeletal:        General: No tenderness.     Cervical back: Normal range of motion and neck supple.     Right lower leg: Edema (1+ pitting) present.     Left lower leg: Edema (1+ pitting) present.  Skin:    General: Skin is warm and dry.  Neurological:     General: No focal deficit present.     Mental Status: She is alert and oriented to person, place, and time.  Psychiatric:        Mood and Affect: Mood normal.        Behavior: Behavior normal.        Thought Content:  Thought content normal.    Assessment & Plan:  1: Chronic heart failure with reduced ejection fraction- - NYHA class II - euvolemic today - weighing daily; reminded to call for an overnight weight gain of > 2 pounds or a weekly weight gain of > 5 pounds - adding "some" salt but is trying to not add any; daughter is grocery shopping for her and has been trying to read food labels for sodium content - on GDMT of metoprolol - current BP will not allow for entresto, SGLT2 or MRA - wearing compression socks daily with removal at bedtime - notes  memory fog since admission and occasionally gets days/nights mixed up; explained this can be common after hospitalization and hopefully it'll clear up; if not, follow up with PCP - BNP 06/07/21 was 585.5  2: HTN- - BP looks good (102/68) - saw PCP Caryl Comes) 12/18/20 - BMP 06/10/21 reviewed and showed sodium 134, potassium 3.7, creatinine 1.19 and GFR 43  3: Atrial fibrillation- - saw cardiology Corky Sox) 05/14/21 - currently on apixaban & metoprolol   Med list reviewed.   Return in 2 months, sooner if needed.

## 2021-06-14 ENCOUNTER — Encounter: Payer: Self-pay | Admitting: Family

## 2021-06-14 ENCOUNTER — Ambulatory Visit: Payer: Medicare Other | Attending: Family | Admitting: Family

## 2021-06-14 ENCOUNTER — Other Ambulatory Visit: Payer: Self-pay

## 2021-06-14 VITALS — BP 102/68 | HR 83 | Resp 16 | Ht 61.0 in | Wt 133.6 lb

## 2021-06-14 DIAGNOSIS — I10 Essential (primary) hypertension: Secondary | ICD-10-CM | POA: Diagnosis not present

## 2021-06-14 DIAGNOSIS — I130 Hypertensive heart and chronic kidney disease with heart failure and stage 1 through stage 4 chronic kidney disease, or unspecified chronic kidney disease: Secondary | ICD-10-CM | POA: Diagnosis not present

## 2021-06-14 DIAGNOSIS — M810 Age-related osteoporosis without current pathological fracture: Secondary | ICD-10-CM | POA: Diagnosis not present

## 2021-06-14 DIAGNOSIS — Z86711 Personal history of pulmonary embolism: Secondary | ICD-10-CM | POA: Diagnosis not present

## 2021-06-14 DIAGNOSIS — I5022 Chronic systolic (congestive) heart failure: Secondary | ICD-10-CM

## 2021-06-14 DIAGNOSIS — K449 Diaphragmatic hernia without obstruction or gangrene: Secondary | ICD-10-CM | POA: Insufficient documentation

## 2021-06-14 DIAGNOSIS — I4891 Unspecified atrial fibrillation: Secondary | ICD-10-CM | POA: Insufficient documentation

## 2021-06-14 DIAGNOSIS — M109 Gout, unspecified: Secondary | ICD-10-CM | POA: Insufficient documentation

## 2021-06-14 DIAGNOSIS — I2510 Atherosclerotic heart disease of native coronary artery without angina pectoris: Secondary | ICD-10-CM | POA: Diagnosis not present

## 2021-06-14 DIAGNOSIS — Z7901 Long term (current) use of anticoagulants: Secondary | ICD-10-CM | POA: Insufficient documentation

## 2021-06-14 DIAGNOSIS — K219 Gastro-esophageal reflux disease without esophagitis: Secondary | ICD-10-CM | POA: Diagnosis not present

## 2021-06-14 DIAGNOSIS — I482 Chronic atrial fibrillation, unspecified: Secondary | ICD-10-CM | POA: Diagnosis not present

## 2021-06-14 DIAGNOSIS — N189 Chronic kidney disease, unspecified: Secondary | ICD-10-CM | POA: Diagnosis not present

## 2021-06-14 DIAGNOSIS — E785 Hyperlipidemia, unspecified: Secondary | ICD-10-CM | POA: Insufficient documentation

## 2021-06-14 DIAGNOSIS — K5790 Diverticulosis of intestine, part unspecified, without perforation or abscess without bleeding: Secondary | ICD-10-CM | POA: Insufficient documentation

## 2021-06-14 NOTE — Patient Instructions (Signed)
Continue weighing daily and call for an overnight weight gain of 3 pounds or more or a weekly weight gain of more than 5 pounds.  ° °The [facility] will be moving around the corner to [id number] mid-February. Our phone number will remain the same ° °

## 2021-06-15 ENCOUNTER — Ambulatory Visit: Payer: Medicare Other | Admitting: Family

## 2021-06-22 DIAGNOSIS — D6869 Other thrombophilia: Secondary | ICD-10-CM | POA: Insufficient documentation

## 2021-06-28 ENCOUNTER — Other Ambulatory Visit: Payer: Self-pay | Admitting: Obstetrics and Gynecology

## 2021-07-17 ENCOUNTER — Other Ambulatory Visit: Payer: Self-pay

## 2021-07-17 ENCOUNTER — Inpatient Hospital Stay
Admission: EM | Admit: 2021-07-17 | Discharge: 2021-07-26 | DRG: 291 | Disposition: A | Discharge status: Skilled Nursing Facility | Payer: Medicare Other | Attending: Internal Medicine | Admitting: Internal Medicine

## 2021-07-17 ENCOUNTER — Emergency Department: Payer: Medicare Other

## 2021-07-17 ENCOUNTER — Encounter: Payer: Self-pay | Admitting: *Deleted

## 2021-07-17 DIAGNOSIS — Z66 Do not resuscitate: Secondary | ICD-10-CM | POA: Diagnosis present

## 2021-07-17 DIAGNOSIS — I5033 Acute on chronic diastolic (congestive) heart failure: Secondary | ICD-10-CM | POA: Diagnosis present

## 2021-07-17 DIAGNOSIS — Z7901 Long term (current) use of anticoagulants: Secondary | ICD-10-CM | POA: Diagnosis not present

## 2021-07-17 DIAGNOSIS — Z9889 Other specified postprocedural states: Secondary | ICD-10-CM

## 2021-07-17 DIAGNOSIS — I482 Chronic atrial fibrillation, unspecified: Secondary | ICD-10-CM | POA: Diagnosis present

## 2021-07-17 DIAGNOSIS — Z20822 Contact with and (suspected) exposure to covid-19: Secondary | ICD-10-CM | POA: Diagnosis present

## 2021-07-17 DIAGNOSIS — Z86711 Personal history of pulmonary embolism: Secondary | ICD-10-CM | POA: Diagnosis not present

## 2021-07-17 DIAGNOSIS — I5023 Acute on chronic systolic (congestive) heart failure: Principal | ICD-10-CM

## 2021-07-17 DIAGNOSIS — I480 Paroxysmal atrial fibrillation: Secondary | ICD-10-CM

## 2021-07-17 DIAGNOSIS — E871 Hypo-osmolality and hyponatremia: Secondary | ICD-10-CM | POA: Diagnosis present

## 2021-07-17 DIAGNOSIS — I2699 Other pulmonary embolism without acute cor pulmonale: Secondary | ICD-10-CM | POA: Insufficient documentation

## 2021-07-17 DIAGNOSIS — D696 Thrombocytopenia, unspecified: Secondary | ICD-10-CM | POA: Diagnosis present

## 2021-07-17 DIAGNOSIS — I272 Pulmonary hypertension, unspecified: Secondary | ICD-10-CM

## 2021-07-17 DIAGNOSIS — N1831 Chronic kidney disease, stage 3a: Secondary | ICD-10-CM | POA: Diagnosis present

## 2021-07-17 DIAGNOSIS — D509 Iron deficiency anemia, unspecified: Secondary | ICD-10-CM

## 2021-07-17 DIAGNOSIS — K219 Gastro-esophageal reflux disease without esophagitis: Secondary | ICD-10-CM | POA: Diagnosis present

## 2021-07-17 DIAGNOSIS — Z90710 Acquired absence of both cervix and uterus: Secondary | ICD-10-CM

## 2021-07-17 DIAGNOSIS — N320 Bladder-neck obstruction: Secondary | ICD-10-CM | POA: Diagnosis present

## 2021-07-17 DIAGNOSIS — I959 Hypotension, unspecified: Secondary | ICD-10-CM | POA: Diagnosis not present

## 2021-07-17 DIAGNOSIS — I5043 Acute on chronic combined systolic (congestive) and diastolic (congestive) heart failure: Secondary | ICD-10-CM | POA: Diagnosis present

## 2021-07-17 DIAGNOSIS — E039 Hypothyroidism, unspecified: Secondary | ICD-10-CM | POA: Diagnosis present

## 2021-07-17 DIAGNOSIS — J918 Pleural effusion in other conditions classified elsewhere: Secondary | ICD-10-CM | POA: Diagnosis present

## 2021-07-17 DIAGNOSIS — R680 Hypothermia, not associated with low environmental temperature: Secondary | ICD-10-CM | POA: Diagnosis present

## 2021-07-17 DIAGNOSIS — I130 Hypertensive heart and chronic kidney disease with heart failure and stage 1 through stage 4 chronic kidney disease, or unspecified chronic kidney disease: Secondary | ICD-10-CM | POA: Diagnosis present

## 2021-07-17 DIAGNOSIS — D649 Anemia, unspecified: Secondary | ICD-10-CM | POA: Diagnosis not present

## 2021-07-17 DIAGNOSIS — K5900 Constipation, unspecified: Secondary | ICD-10-CM | POA: Diagnosis not present

## 2021-07-17 DIAGNOSIS — M810 Age-related osteoporosis without current pathological fracture: Secondary | ICD-10-CM | POA: Diagnosis present

## 2021-07-17 DIAGNOSIS — I5032 Chronic diastolic (congestive) heart failure: Secondary | ICD-10-CM | POA: Diagnosis present

## 2021-07-17 DIAGNOSIS — T68XXXA Hypothermia, initial encounter: Secondary | ICD-10-CM

## 2021-07-17 DIAGNOSIS — N390 Urinary tract infection, site not specified: Secondary | ICD-10-CM | POA: Diagnosis not present

## 2021-07-17 DIAGNOSIS — J9601 Acute respiratory failure with hypoxia: Secondary | ICD-10-CM | POA: Diagnosis present

## 2021-07-17 DIAGNOSIS — A419 Sepsis, unspecified organism: Secondary | ICD-10-CM

## 2021-07-17 DIAGNOSIS — R339 Retention of urine, unspecified: Secondary | ICD-10-CM | POA: Diagnosis present

## 2021-07-17 DIAGNOSIS — Z79899 Other long term (current) drug therapy: Secondary | ICD-10-CM

## 2021-07-17 DIAGNOSIS — J90 Pleural effusion, not elsewhere classified: Secondary | ICD-10-CM

## 2021-07-17 DIAGNOSIS — Z8249 Family history of ischemic heart disease and other diseases of the circulatory system: Secondary | ICD-10-CM

## 2021-07-17 DIAGNOSIS — Z888 Allergy status to other drugs, medicaments and biological substances status: Secondary | ICD-10-CM

## 2021-07-17 DIAGNOSIS — I2510 Atherosclerotic heart disease of native coronary artery without angina pectoris: Secondary | ICD-10-CM | POA: Diagnosis present

## 2021-07-17 DIAGNOSIS — R0603 Acute respiratory distress: Secondary | ICD-10-CM

## 2021-07-17 DIAGNOSIS — I10 Essential (primary) hypertension: Secondary | ICD-10-CM | POA: Diagnosis not present

## 2021-07-17 DIAGNOSIS — R0602 Shortness of breath: Secondary | ICD-10-CM

## 2021-07-17 DIAGNOSIS — E785 Hyperlipidemia, unspecified: Secondary | ICD-10-CM | POA: Diagnosis present

## 2021-07-17 DIAGNOSIS — I6529 Occlusion and stenosis of unspecified carotid artery: Secondary | ICD-10-CM | POA: Diagnosis present

## 2021-07-17 DIAGNOSIS — R059 Cough, unspecified: Secondary | ICD-10-CM

## 2021-07-17 DIAGNOSIS — Z8673 Personal history of transient ischemic attack (TIA), and cerebral infarction without residual deficits: Secondary | ICD-10-CM | POA: Diagnosis not present

## 2021-07-17 DIAGNOSIS — Z79890 Hormone replacement therapy (postmenopausal): Secondary | ICD-10-CM

## 2021-07-17 DIAGNOSIS — R338 Other retention of urine: Secondary | ICD-10-CM | POA: Diagnosis not present

## 2021-07-17 LAB — COMPREHENSIVE METABOLIC PANEL
ALT: 23 U/L (ref 0–44)
AST: 40 U/L (ref 15–41)
Albumin: 3.8 g/dL (ref 3.5–5.0)
Alkaline Phosphatase: 170 U/L — ABNORMAL HIGH (ref 38–126)
Anion gap: 8 (ref 5–15)
BUN: 31 mg/dL — ABNORMAL HIGH (ref 8–23)
CO2: 34 mmol/L — ABNORMAL HIGH (ref 22–32)
Calcium: 9.4 mg/dL (ref 8.9–10.3)
Chloride: 91 mmol/L — ABNORMAL LOW (ref 98–111)
Creatinine, Ser: 1.08 mg/dL — ABNORMAL HIGH (ref 0.44–1.00)
GFR, Estimated: 48 mL/min — ABNORMAL LOW (ref >60)
Glucose, Bld: 97 mg/dL (ref 70–99)
Potassium: 3.8 mmol/L (ref 3.5–5.1)
Sodium: 133 mmol/L — ABNORMAL LOW (ref 135–145)
Total Bilirubin: 0.7 mg/dL (ref 0.3–1.2)
Total Protein: 6.9 g/dL (ref 6.5–8.1)

## 2021-07-17 LAB — CBC WITH DIFFERENTIAL/PLATELET
Abs Immature Granulocytes: 0.01 10*3/uL (ref 0.00–0.07)
Basophils Absolute: 0 10*3/uL (ref 0.0–0.1)
Basophils Relative: 1 %
Eosinophils Absolute: 0.1 10*3/uL (ref 0.0–0.5)
Eosinophils Relative: 1 %
HCT: 32.8 % — ABNORMAL LOW (ref 36.0–46.0)
Hemoglobin: 9.9 g/dL — ABNORMAL LOW (ref 12.0–15.0)
Immature Granulocytes: 0 %
Lymphocytes Relative: 19 %
Lymphs Abs: 1.2 10*3/uL (ref 0.7–4.0)
MCH: 25.6 pg — ABNORMAL LOW (ref 26.0–34.0)
MCHC: 30.2 g/dL (ref 30.0–36.0)
MCV: 84.8 fL (ref 80.0–100.0)
Monocytes Absolute: 0.7 10*3/uL (ref 0.1–1.0)
Monocytes Relative: 11 %
Neutro Abs: 4.2 10*3/uL (ref 1.7–7.7)
Neutrophils Relative %: 68 %
Platelets: 155 10*3/uL (ref 150–400)
RBC: 3.87 MIL/uL (ref 3.87–5.11)
RDW: 25.2 % — ABNORMAL HIGH (ref 11.5–15.5)
WBC: 6.2 10*3/uL (ref 4.0–10.5)
nRBC: 0 % (ref 0.0–0.2)

## 2021-07-17 LAB — LACTIC ACID, PLASMA: Lactic Acid, Venous: 1.4 mmol/L (ref 0.5–1.9)

## 2021-07-17 LAB — RESP PANEL BY RT-PCR (FLU A&B, COVID) ARPGX2
Influenza A by PCR: NEGATIVE
Influenza B by PCR: NEGATIVE
SARS Coronavirus 2 by RT PCR: NEGATIVE

## 2021-07-17 LAB — BRAIN NATRIURETIC PEPTIDE: B Natriuretic Peptide: 484 pg/mL — ABNORMAL HIGH (ref 0.0–100.0)

## 2021-07-17 LAB — TROPONIN I (HIGH SENSITIVITY): Troponin I (High Sensitivity): 11 ng/L (ref <18)

## 2021-07-17 MED ORDER — ATORVASTATIN CALCIUM 20 MG PO TABS
40.0000 mg | ORAL_TABLET | Freq: Every evening | ORAL | Status: DC
  Administered 2021-07-17 – 2021-07-25 (×9): 40 mg via ORAL
  Filled 2021-07-17 (×9): qty 2

## 2021-07-17 MED ORDER — ONDANSETRON HCL 4 MG/2ML IJ SOLN
4.0000 mg | Freq: Three times a day (TID) | INTRAMUSCULAR | Status: DC | PRN
Start: 2021-07-17 — End: 2021-07-26

## 2021-07-17 MED ORDER — FUROSEMIDE 10 MG/ML IJ SOLN
60.0000 mg | Freq: Once | INTRAMUSCULAR | Status: AC
  Administered 2021-07-17: 60 mg via INTRAVENOUS
  Filled 2021-07-17: qty 8

## 2021-07-17 MED ORDER — VITAMIN D3 25 MCG (1000 UNIT) PO TABS
2000.0000 [IU] | ORAL_TABLET | Freq: Every day | ORAL | Status: DC
  Administered 2021-07-18 – 2021-07-26 (×9): 2000 [IU] via ORAL
  Filled 2021-07-17 (×18): qty 2

## 2021-07-17 MED ORDER — DM-GUAIFENESIN ER 30-600 MG PO TB12
1.0000 | ORAL_TABLET | Freq: Two times a day (BID) | ORAL | Status: DC | PRN
Start: 2021-07-17 — End: 2021-07-26

## 2021-07-17 MED ORDER — PANTOPRAZOLE SODIUM 40 MG PO TBEC
40.0000 mg | DELAYED_RELEASE_TABLET | Freq: Every day | ORAL | Status: DC
  Administered 2021-07-17 – 2021-07-26 (×10): 40 mg via ORAL
  Filled 2021-07-17 (×10): qty 1

## 2021-07-17 MED ORDER — OXYBUTYNIN CHLORIDE 5 MG PO TABS
5.0000 mg | ORAL_TABLET | Freq: Every day | ORAL | Status: DC
  Administered 2021-07-17 – 2021-07-20 (×4): 5 mg via ORAL
  Filled 2021-07-17 (×4): qty 1

## 2021-07-17 MED ORDER — ACETAMINOPHEN 325 MG PO TABS
650.0000 mg | ORAL_TABLET | Freq: Four times a day (QID) | ORAL | Status: DC | PRN
Start: 2021-07-17 — End: 2021-07-26
  Administered 2021-07-18: 650 mg via ORAL
  Filled 2021-07-17: qty 2

## 2021-07-17 MED ORDER — ORAL CARE MOUTH RINSE
15.0000 mL | Freq: Two times a day (BID) | OROMUCOSAL | Status: DC
  Administered 2021-07-18 – 2021-07-26 (×16): 15 mL via OROMUCOSAL

## 2021-07-17 MED ORDER — ALBUTEROL SULFATE (2.5 MG/3ML) 0.083% IN NEBU
3.0000 mL | INHALATION_SOLUTION | RESPIRATORY_TRACT | Status: DC | PRN
Start: 2021-07-17 — End: 2021-07-26
  Administered 2021-07-25: 3 mL via RESPIRATORY_TRACT
  Filled 2021-07-17: qty 3

## 2021-07-17 MED ORDER — CALCIUM CARBONATE 1250 (500 CA) MG PO TABS
1.0000 | ORAL_TABLET | Freq: Every day | ORAL | Status: DC
  Administered 2021-07-17 – 2021-07-25 (×9): 500 mg via ORAL
  Filled 2021-07-17 (×10): qty 1

## 2021-07-17 MED ORDER — LEVOTHYROXINE SODIUM 25 MCG PO TABS
25.0000 ug | ORAL_TABLET | Freq: Every day | ORAL | Status: DC
  Administered 2021-07-18 – 2021-07-26 (×9): 25 ug via ORAL
  Filled 2021-07-17 (×10): qty 1

## 2021-07-17 MED ORDER — LEVOTHYROXINE SODIUM 25 MCG PO TABS
25.0000 ug | ORAL_TABLET | Freq: Once | ORAL | Status: AC
  Administered 2021-07-17: 25 ug via ORAL
  Filled 2021-07-17: qty 1

## 2021-07-17 MED ORDER — METOPROLOL SUCCINATE ER 25 MG PO TB24
25.0000 mg | ORAL_TABLET | Freq: Every day | ORAL | Status: DC
  Administered 2021-07-18 – 2021-07-21 (×4): 25 mg via ORAL
  Filled 2021-07-17 (×4): qty 1

## 2021-07-17 MED ORDER — APIXABAN 2.5 MG PO TABS
2.5000 mg | ORAL_TABLET | Freq: Two times a day (BID) | ORAL | Status: DC
  Administered 2021-07-17 – 2021-07-19 (×5): 2.5 mg via ORAL
  Filled 2021-07-17 (×7): qty 1

## 2021-07-17 MED ORDER — LEVOTHYROXINE SODIUM 100 MCG/5ML IV SOLN
25.0000 ug | Freq: Once | INTRAVENOUS | Status: DC
Start: 2021-07-17 — End: 2021-07-17

## 2021-07-17 MED ORDER — METOPROLOL SUCCINATE ER 50 MG PO TB24: 50.0000 mg | ORAL_TABLET | Freq: Every day | ORAL | Status: DC

## 2021-07-17 MED ORDER — ALLOPURINOL 100 MG PO TABS
100.0000 mg | ORAL_TABLET | Freq: Every day | ORAL | Status: DC
  Administered 2021-07-18 – 2021-07-26 (×9): 100 mg via ORAL
  Filled 2021-07-17 (×9): qty 1

## 2021-07-17 NOTE — ED Notes (Signed)
Admitting in to see, at [facility]. Pt alert, NAD, calm, interactive. ?

## 2021-07-17 NOTE — ED Notes (Addendum)
Back from xray. EDP at [facility]. Daughter at [facility]. Pt in NAD, calm, interactive, resps e/mild increased wob.  ?

## 2021-07-17 NOTE — ED Notes (Signed)
Pt to xray

## 2021-07-17 NOTE — Progress Notes (Signed)
Bair Hugger placed ? ?

## 2021-07-17 NOTE — Progress Notes (Signed)
MD notified of rectal temp 93.9.  Bair hugger ordered. ? ?

## 2021-07-17 NOTE — ED Triage Notes (Signed)
BIB [facility] from home for sob since 0630, h/o CHF and afib, HR 100, SPO2 83% on RA for EMS, improved with duoneb 10/0.5 and O2, NSL 22 R hand, 91% RA on arrival to ED, placed on [place] 2L, denies pain or other sx, no recent fever or illness. BLE edema is baseline per pt.  ?

## 2021-07-17 NOTE — Plan of Care (Signed)

## 2021-07-17 NOTE — ED Notes (Signed)
Pt going to xray  

## 2021-07-17 NOTE — ED Provider Notes (Signed)
[facility] Provider Note    Event Date/Time   First MD Initiated Contact with Patient 07/17/21 1046     (approximate)   History   Shortness of Breath   HPI  Allison Novak is a 88 y.o. female with fairly extensive past medical history including CHF, A-fib RVR, hypertension, anemia, here with shortness of breath.  The patient states that over the last several days, she has had progressive worsening shortness of breath.  This was primarily with lying flat and exertion, though not was present at rest.  She has been having to sit more upright.  She had associated leg swelling.  She states she feels similar to how she did when she was admitted previously for CHF exacerbation.  She has had a little bit of sputum production but it has been mostly clear.  No fevers.  No known sick contacts.  No known COVID exposures.  No chest pain.     Physical Exam   Triage Vital Signs: ED Triage Vitals  Enc Vitals Group     BP 07/17/21 1030 121/71     Pulse Rate 07/17/21 1030 96     Resp 07/17/21 1030 (!) 21     Temp --      Temp src --      SpO2 07/17/21 1026 (!) 83 %     Weight 07/17/21 1044 133 lb (60.3 kg)     Height --      Head Circumference --      Peak Flow --      Pain Score 07/17/21 1044 0     Pain Loc --      Pain Edu? --      Excl. in [place]? --     Most recent vital signs: Vitals:   07/17/21 1535 07/17/21 1600  BP: (!) 92/49   Pulse:    Resp:    Temp:  (!) 96 F (35.6 C)  SpO2:       General: Awake, no distress.  CV:  Good peripheral perfusion.  Tachycardia, irregular rhythm.  No murmurs or rubs. Resp:  Normal effort.  Mild tachypnea with bibasilar Rales.  Slight increased work of breathing.  Speaking in full sentences, however. Abd:  No distention.  No tenderness. Other:  2+ pitting edema bilateral lower extremities.   ED Results / Procedures / Treatments   Labs (all labs ordered are listed, but only abnormal results are displayed) Labs  Reviewed  CBC WITH DIFFERENTIAL/PLATELET - Abnormal; Notable for the following components:      Result Value   Hemoglobin 9.9 (*)    HCT 32.8 (*)    MCH 25.6 (*)    RDW 25.2 (*)    All other components within normal limits  COMPREHENSIVE METABOLIC PANEL - Abnormal; Notable for the following components:   Sodium 133 (*)    Chloride 91 (*)    CO2 34 (*)    BUN 31 (*)    Creatinine, Ser 1.08 (*)    Alkaline Phosphatase 170 (*)    GFR, Estimated 48 (*)    All other components within normal limits  BRAIN NATRIURETIC PEPTIDE - Abnormal; Notable for the following components:   B Natriuretic Peptide 484.0 (*)    All other components within normal limits  RESP PANEL BY RT-PCR (FLU A&B, COVID) ARPGX2  LACTIC ACID, PLASMA  BASIC METABOLIC PANEL  MAGNESIUM  TROPONIN I (HIGH SENSITIVITY)     EKG Afib with tachycardia, VR 102. QRS  88. Probably LVH. Old infarct. No acute ST elevations.   RADIOLOGY Chest x-ray: Moderate right and small left pleural effusions with passive atelectasis, mild interstitial edema   I also independently reviewed and agree wit radiologist interpretations.   PROCEDURES:  Critical Care performed: Yes, see critical care procedure note(s)  .Critical Care Performed by: Duffy Bruce, MD Authorized by: Duffy Bruce, MD   Critical care provider statement:    Critical care time (minutes):  30   Critical care time was exclusive of:  Separately billable procedures and treating other patients   Critical care was necessary to treat or prevent imminent or life-threatening deterioration of the following conditions:  Cardiac failure, circulatory failure and respiratory failure   Critical care was time spent personally by me on the following activities:  Development of treatment plan with patient or surrogate, discussions with consultants, evaluation of patient's response to treatment, examination of patient, ordering and review of laboratory studies, ordering and  review of radiographic studies, ordering and performing treatments and interventions, pulse oximetry, re-evaluation of patient's condition and review of old [place] ED: Medications  albuterol (PROVENTIL) (2.5 MG/3ML) 0.083% nebulizer solution 3 mL (has no administration in time range)  dextromethorphan-guaiFENesin (MUCINEX DM) 30-600 MG per 12 hr tablet 1 tablet (has no administration in time range)  ondansetron (ZOFRAN) injection 4 mg (has no administration in time range)  acetaminophen (TYLENOL) tablet 650 mg (has no administration in time range)  allopurinol (ZYLOPRIM) tablet 100 mg (has no administration in time range)  atorvastatin (LIPITOR) tablet 40 mg (40 mg Oral Given 07/17/21 1611)  pantoprazole (PROTONIX) EC tablet 40 mg (40 mg Oral Given 07/17/21 1534)  oxybutynin (DITROPAN) tablet 5 mg (has no administration in time range)  apixaban (ELIQUIS) tablet 2.5 mg (2.5 mg Oral Given 07/17/21 1534)  calcium carbonate (OS-CAL - dosed in mg of elemental calcium) tablet 500 mg of elemental calcium (has no administration in time range)  cholecalciferol (VITAMIN D) tablet 2,000 Units (has no administration in time range)  levothyroxine (SYNTHROID) tablet 25 mcg (has no administration in time range)  metoprolol succinate (TOPROL-XL) 24 hr tablet 25 mg (has no administration in time range)  furosemide (LASIX) injection 60 mg (60 mg Intravenous Given 07/17/21 1136)  levothyroxine (SYNTHROID) tablet 25 mcg (25 mcg Oral Given 07/17/21 1534)     IMPRESSION / MDM / [place] / ED COURSE  I reviewed the triage vital signs and the nursing notes.                               The patient is on the cardiac monitor to evaluate for evidence of arrhythmia and/or significant heart rate changes.   Ddx:  CHF exacerbation, symptomatic A-fib RVR, pneumonia, pleural effusions, ACS, unlikely PE, anemia   MDM:  88 yo F with PMHx HTN, CHF, CAD, AFib here with SOB, weakness.  On exam, pt hypervolemic with pitting edema and bilateral rales, increased WOB. Pt has new O2 requirement in ED. She denies any fevers, sputum production, or infectious sx. CBC shows no leukocytosis. CMP shows renal function is at baseline.BNP elevated at 484. Trop negative. EKG shows AFib but no ischemia. COVID is negative. CXR reviewed by me and shows moderate bilateral effusions, pulmonary edema.  Suspect recurrent hypoxic rsp failure 2/2 CHF, now with effusions. May benefit from thoracentesis as inpt. Will diurese, admit to medicine. Dr. Blaine Hamper consulted and will admit. Pt, family  are in agreement.    MEDICATIONS GIVEN IN ED: Medications  albuterol (PROVENTIL) (2.5 MG/3ML) 0.083% nebulizer solution 3 mL (has no administration in time range)  dextromethorphan-guaiFENesin (MUCINEX DM) 30-600 MG per 12 hr tablet 1 tablet (has no administration in time range)  ondansetron (ZOFRAN) injection 4 mg (has no administration in time range)  acetaminophen (TYLENOL) tablet 650 mg (has no administration in time range)  allopurinol (ZYLOPRIM) tablet 100 mg (has no administration in time range)  atorvastatin (LIPITOR) tablet 40 mg (40 mg Oral Given 07/17/21 1611)  pantoprazole (PROTONIX) EC tablet 40 mg (40 mg Oral Given 07/17/21 1534)  oxybutynin (DITROPAN) tablet 5 mg (has no administration in time range)  apixaban (ELIQUIS) tablet 2.5 mg (2.5 mg Oral Given 07/17/21 1534)  calcium carbonate (OS-CAL - dosed in mg of elemental calcium) tablet 500 mg of elemental calcium (has no administration in time range)  cholecalciferol (VITAMIN D) tablet 2,000 Units (has no administration in time range)  levothyroxine (SYNTHROID) tablet 25 mcg (has no administration in time range)  metoprolol succinate (TOPROL-XL) 24 hr tablet 25 mg (has no administration in time range)  furosemide (LASIX) injection 60 mg (60 mg Intravenous Given 07/17/21 1136)  levothyroxine (SYNTHROID) tablet 25 mcg (25 mcg Oral Given 07/17/21 1534)      Consults:  Hospitalist consulted for admission   EMR reviewed  Reviewed recent PCP notes including PCP visit on 3/8 with hypertension and chronic systolic CHF, prior hospitalization including January 2023 for CHF and hyponatremia     FINAL CLINICAL IMPRESSION(S) / ED DIAGNOSES   Final diagnoses:  Acute on chronic systolic congestive heart failure ([place])  Paroxysmal atrial fibrillation ([place])     Rx / DC Orders   ED Discharge Orders     None        Note:  This document was prepared using Dragon voice recognition software and may include unintentional dictation errors.   Duffy Bruce, MD 07/17/21 [phone]

## 2021-07-17 NOTE — H&P (Addendum)
History and Physical    Allison Novak GUY:[id number] DOB: 24-Aug-1925 DOA: 07/17/2021  Referring MD/NP/PA:   PCP: Adin Hector, MD   Patient coming from:  The patient is coming from home.  At baseline, pt is independent for most of ADL.        Chief Complaint: SOB  HPI: Allison Novak is a 88 y.o. female with medical history significant of dCHF, hypertension, hyperlipidemia, TIA, GERD, hypothyroidism, gout, atrial fibrillation and PE on Eliquis, CKD-3A, carotid artery stenosis, anemia, CAD, who presents with shortness breath  Patient has shortness of breath for several days, which has been progressively worsening since this morning.  Patient has cough with little mucus production.  No chest pain, fever or chills.  Denies nausea, vomiting, diarrhea or abdominal pain.  No symptoms of UTI.  Patient states that she has history of hyponatremia, she took little extra salt yesterday.  Patient was found to have oxygen desaturation to 83% on room air, which improved to 93-100% on 2 L oxygen in ED. patient is not wearing oxygen normally.  Data Reviewed and ED Course: pt was found to have BNP 484, sodium 133, troponin level 11, lactic acid 1.4, negative COVID PCR, stable renal function, temperature 93.9, soft blood pressure 90/48, 95/49, heart rate 115, RR 26. Pt is admitted to telemetry bed as inpatient.   CXR: 1. Moderate right and small left pleural effusions with associated passive atelectasis. 2. Suspected enlargement of the cardiopericardial silhouette potentially with mild interstitial edema. 3. Low lung volumes. 4.  Aortic Atherosclerosis (ICD10-I70.0). 5. Thoracic spondylosis.  EKG: I have personally reviewed.  Atrial fibrillation, QTc 471, low voltage, poor R wave progression, left axis deviation.   Review of Systems:   General: no fevers, chills, no body weight gain, has fatigue HEENT: no blurry vision, hearing changes or sore throat Respiratory: has dyspnea, coughing, no  wheezing CV: no chest pain, no palpitations GI: no nausea, vomiting, abdominal pain, diarrhea, constipation GU: no dysuria, burning on urination, increased urinary frequency, hematuria  Ext: has leg edema Neuro: no unilateral weakness, numbness, or tingling, no vision change or hearing loss Skin: no rash, no skin tear. MSK: No muscle spasm, no deformity, no limitation of range of movement in spin Heme: No easy bruising.  Travel history: No recent long distant travel.   Allergy:  Allergies  Allergen Reactions   Keflex [Cephalexin] Other (See Comments)    Pt cant remember exact reaction   Macrodantin [Nitrofurantoin Macrocrystal] Other (See Comments)    Past Medical History:  Diagnosis Date   A-fib ([place])    Anemia    Carotid artery stenosis    CHF (congestive heart failure) (HCC)    Chronic kidney disease    Coronary artery disease    Diverticulosis    GERD (gastroesophageal reflux disease)    Gout    Hiatal hernia    Hyperlipidemia    Hypertension    Osteoporosis    Pulmonary embolus ([place])     Past Surgical History:  Procedure Laterality Date   ABDOMINAL HYSTERECTOMY     ANKLE SURGERY Left 2011   KNEE ARTHROSCOPY Right     Social History:  reports that she has never smoked. She has never used smokeless tobacco. She reports that she does not drink alcohol and does not use drugs.  Family History:  Family History  Problem Relation Age of Onset   CAD Mother    Hypertension Sister      Prior to Admission medications  Medication Sig Start Date End Date Taking? Authorizing Provider  allopurinol (ZYLOPRIM) 100 MG tablet Take 100 mg by mouth daily.    [provider]  atorvastatin (LIPITOR) 40 MG tablet Take 40 mg by mouth daily. 11/18/16   [provider]  azelastine (ASTELIN) 0.1 % nasal spray Place 1 spray into both nostrils daily. 06/15/20   [provider]  calcium carbonate (OSCAL) 1500 (600 Ca) MG TABS tablet Take 1 tablet by mouth in  the morning and at bedtime.    [provider]  Cholecalciferol 50 MCG (2000 UT) CAPS Take 2,000 Units by mouth daily.    [provider]  ELIQUIS 2.5 MG TABS tablet Take 2.5 mg by mouth 2 (two) times daily. 02/13/20   [provider]  furosemide (LASIX) 20 MG tablet 2 tabs every morning and 1 tab every evening 06/10/21   Wouk, Ailene Rud, MD  levothyroxine (SYNTHROID) 25 MCG tablet Take 25 mcg by mouth daily. 07/14/21   [provider]  metoprolol succinate (TOPROL-XL) 50 MG 24 hr tablet Take 1 tablet (50 mg total) by mouth daily. Take with or immediately following a meal. 06/11/21   Wouk, Ailene Rud, MD  omeprazole (PRILOSEC) 40 MG capsule Take 40 mg by mouth daily.     [provider]  oxybutynin (DITROPAN) 5 MG tablet Take 5 mg by mouth at bedtime.    [provider]  potassium chloride (KLOR-CON) 10 MEQ tablet Take 1 tablet (10 mEq total) by mouth daily. 02/18/20   Lorella Nimrod, MD    Physical Exam: Vitals:   07/17/21 1215 07/17/21 1230 07/17/21 1250 07/17/21 1300  BP: (!) 95/49 110/73 (!) 98/55 94/61  Pulse:      Resp: (!) 21 12 (!) 24 18  SpO2: 93%  92% 97%  Weight:       General: Not in acute distress HEENT:       Eyes: PERRL, EOMI, no scleral icterus.       ENT: No discharge from the ears and nose, no pharynx injection, no tonsillar enlargement.        Neck: Positive JVD, no bruit, no mass felt. Heme: No neck lymph node enlargement. Cardiac: S1/S2, RRR, No murmurs, No gallops or rubs. Respiratory: has rales bilaterally GI: Soft, nondistended, nontender, no rebound pain, no organomegaly, BS present. GU: No hematuria Ext: 2+ pitting leg edema bilaterally. 1+DP/PT pulse bilaterally. Musculoskeletal: No joint deformities, No joint redness or warmth, no limitation of ROM in spin. Skin: No rashes.  Neuro: Alert, oriented X3, cranial nerves II-XII grossly intact, moves all extremities normally.  Psych: Patient is not psychotic,  no suicidal or hemocidal ideation.  Labs on Admission: I have personally reviewed following labs and imaging studies  CBC: Recent Labs  Lab 07/17/21 1050  WBC 6.2  NEUTROABS 4.2  HGB 9.9*  HCT 32.8*  MCV 84.8  PLT 629   Basic Metabolic Panel: Recent Labs  Lab 07/17/21 1050  NA 133*  K 3.8  CL 91*  CO2 34*  GLUCOSE 97  BUN 31*  CREATININE 1.08*  CALCIUM 9.4   GFR: Estimated Creatinine Clearance: 27.1 mL/min (A) (by C-G formula based on SCr of 1.08 mg/dL (H)). Liver Function Tests: Recent Labs  Lab 07/17/21 1050  AST 40  ALT 23  ALKPHOS 170*  BILITOT 0.7  PROT 6.9  ALBUMIN 3.8   No results for input(s): LIPASE, AMYLASE in the last 168 hours. No results for input(s): AMMONIA in the last 168 hours. Coagulation  Profile: No results for input(s): INR, PROTIME in the last 168 hours. Cardiac Enzymes: No results for input(s): CKTOTAL, CKMB, CKMBINDEX, TROPONINI in the last 168 hours. BNP (last 3 results) No results for input(s): PROBNP in the last 8760 hours. HbA1C: No results for input(s): HGBA1C in the last 72 hours. CBG: No results for input(s): GLUCAP in the last 168 hours. Lipid Profile: No results for input(s): CHOL, HDL, LDLCALC, TRIG, CHOLHDL, LDLDIRECT in the last 72 hours. Thyroid Function Tests: No results for input(s): TSH, T4TOTAL, FREET4, T3FREE, THYROIDAB in the last 72 hours. Anemia Panel: No results for input(s): VITAMINB12, FOLATE, FERRITIN, TIBC, IRON, RETICCTPCT in the last 72 hours. Urine analysis:    Component Value Date/Time   COLORURINE COLORLESS (A) 02/18/2017 1318   APPEARANCEUR CLEAR (A) 02/18/2017 1318   LABSPEC 1.002 (L) 02/18/2017 1318   PHURINE 7.0 02/18/2017 1318   GLUCOSEU NEGATIVE 02/18/2017 1318   HGBUR NEGATIVE 02/18/2017 1318   BILIRUBINUR NEGATIVE 02/18/2017 1318   KETONESUR NEGATIVE 02/18/2017 1318   PROTEINUR NEGATIVE 02/18/2017 1318   NITRITE NEGATIVE 02/18/2017 1318   LEUKOCYTESUR NEGATIVE 02/18/2017 1318    Sepsis Labs: '@LABRCNTIP'$ (procalcitonin:4,lacticidven:4) ) Recent Results (from the past 240 hour(s))  Resp Panel by RT-PCR (Flu A&B, Covid) Nasopharyngeal Swab     Status: None   Collection Time: 07/17/21 10:50 AM   Specimen: Nasopharyngeal Swab; Nasopharyngeal(NP) swabs in vial transport medium  Result Value Ref Range Status   SARS Coronavirus 2 by RT PCR NEGATIVE NEGATIVE Final    Comment: (NOTE) SARS-CoV-2 target nucleic acids are NOT DETECTED.  The SARS-CoV-2 RNA is generally detectable in upper respiratory specimens during the acute phase of infection. The lowest concentration of SARS-CoV-2 viral copies this assay can detect is 138 copies/mL. A negative result does not preclude SARS-Cov-2 infection and should not be used as the sole basis for treatment or other patient management decisions. A negative result may occur with  improper specimen collection/handling, submission of specimen other than nasopharyngeal swab, presence of viral mutation(s) within the areas targeted by this assay, and inadequate number of viral copies(<138 copies/mL). A negative result must be combined with clinical observations, patient history, and epidemiological information. The expected result is Negative.  Fact Sheet for Patients:  EntrepreneurPulse.com.au  Fact Sheet for Healthcare Providers:  IncredibleEmployment.be  This test is no t yet approved or cleared by the [place] FDA and  has been authorized for detection and/or diagnosis of SARS-CoV-2 by FDA under an Emergency Use Authorization (EUA). This EUA will remain  in effect (meaning this test can be used) for the duration of the COVID-19 declaration under Section 564(b)(1) of the Act, 21 U.S.C.section 360bbb-3(b)(1), unless the authorization is terminated  or revoked sooner.       Influenza A by PCR NEGATIVE NEGATIVE Final   Influenza B by PCR NEGATIVE NEGATIVE Final    Comment: (NOTE) The  Xpert Xpress SARS-CoV-2/FLU/RSV plus assay is intended as an aid in the diagnosis of influenza from Nasopharyngeal swab specimens and should not be used as a sole basis for treatment. Nasal washings and aspirates are unacceptable for Xpert Xpress SARS-CoV-2/FLU/RSV testing.  Fact Sheet for Patients: EntrepreneurPulse.com.au  Fact Sheet for Healthcare Providers: IncredibleEmployment.be  This test is not yet approved or cleared by the [place] FDA and has been authorized for detection and/or diagnosis of SARS-CoV-2 by FDA under an Emergency Use Authorization (EUA). This EUA will remain in effect (meaning this test can be used) for the duration of the COVID-19 declaration under Section  564(b)(1) of the Act, 21 U.S.C. section 360bbb-3(b)(1), unless the authorization is terminated or revoked.  Performed at [facility], [address] [place], [place] [address]      Radiological Exams on Admission: DG Chest 2 View  Result Date: 07/17/2021 CLINICAL DATA:  Shortness of breath. Atrial fibrillation. Congestive heart failure. Edema. EXAM: CHEST - 2 VIEW COMPARISON:  06/07/2021 FINDINGS: Low lung volumes are present, causing crowding of the pulmonary vasculature. Suspected enlargement of the cardiopericardial silhouette with indistinct cardiac margins. Atherosclerotic calcification of the aortic arch. Hazy perihilar and basilar opacities potentially from edema or pneumonia. Suspected moderate right and small left pleural effusions based on the lateral projection. Mild bilateral interstitial accentuation is increased from prior. Thoracic spondylosis. IMPRESSION: 1. Moderate right and small left pleural effusions with associated passive atelectasis. 2. Suspected enlargement of the cardiopericardial silhouette potentially with mild interstitial edema. 3. Low lung volumes. 4.  Aortic Atherosclerosis (ICD10-I70.0). 5. Thoracic spondylosis. Electronically  Signed   By: Van Clines M.D.   On: 07/17/2021 11:36      Assessment/Plan Principal Problem:   Acute on chronic diastolic CHF (congestive heart failure) (HCC) Active Problems:   Acute respiratory failure with hypoxia (HCC)   HTN (hypertension), malignant   History of TIA (transient ischemic attack)   Hyperlipidemia   Atrial fibrillation, chronic (HCC)   Hyponatremia   Pulmonary embolism (HCC)   Chronic kidney disease, stage 3a (HCC)   Normocytic anemia   Hypothyroidism   Acute respiratory failure with hypoxia due to acute on chronic diastolic CHF (congestive heart failure) ([place]): 2D echo on 06/08/2021 showed EF 55 to 60%.  Patient has 2+ leg edema, crackles on auscultation, positive JVD, elevated BNP 484, clinically consistent with CHF exacerbation.  Patient has new 2 L oxygen requirement today.   -Will admit to tele bed as inpatient -Lasix 60 mg was given in ED. Will hold off further Lasix today due to softer blood pressure.  Need to be reevaluated in the morning to decide further diuresis -will repeat 2d echo -Daily weights -strict I/O's -Fluid restriction -Obtain REDs Vest reading  HTN (hypertension), malignant: Blood pressures are soft with SBP of 90s -Pt is on metoprolol, will put holding parameters for SBP <95  History of TIA (transient ischemic attack) -Lipitor -Patient is on Eliquis for A-fib  Hyperlipidemia -Lipitor  Atrial fibrillation, chronic (HCC) -Continue metoprolol with holding parameters for SBP <95. Will decrease metoprolol dose from 50 to 25 mg daily due to softer blood pressure -Continue Eliquis  History of Pulmonary embolism (HCC) -Eliquis  Chronic kidney disease, stage 3a ([place]): Stable -Follow-up with BMP  Normocytic anemia: Hemoglobin 9.9 (8.2 on 06/10/2021), stable -Follow-up with CBC  Hyponatremia: Sodium 133, mild.  Mental status normal -Will let patient eat regular diet -Follow-up with BMP  Hypothyroidism: per her daughter, PCP  prescribed Synthroid 25 mcg daily, but pt has not started taking it yet.  Patient's TSH was 7.353 on 07[birth year]09[birth year]1931. -Start Synthroid 25 mcg daily   Hypothermia: T 93.9. 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 Inpatient status is judged to be reasonable and necessary in order to provide the required intensity of service to ensure the patient's safety. The patient's presenting symptoms, physical exam findings, and initial radiographic and laboratory data in the context of their chronic comorbidities is felt to place them at high risk for further clinical deterioration. Furthermore, it is not anticipated that the patient will be medically stable for discharge from the hospital within 2 midnights of admission.   * I certify that at the point of admission it is my clinical judgment that the patient will require inpatient hospital care spanning beyond 2 midnights from the point of admission due to high intensity of service, high risk for further deterioration and high frequency of surveillance required.*       Date of Service 07/17/2021    Ivor [facility]   If 7PM-7AM, please contact night-coverage [email or web address] 07/17/2021, 1:52 PM

## 2021-07-18 ENCOUNTER — Inpatient Hospital Stay: Payer: Medicare Other

## 2021-07-18 ENCOUNTER — Inpatient Hospital Stay
Admit: 2021-07-18 | Discharge: 2021-07-18 | Disposition: A | Discharge status: Home / Self Care | Payer: Medicare Other | Attending: Internal Medicine | Admitting: Internal Medicine

## 2021-07-18 DIAGNOSIS — T68XXXA Hypothermia, initial encounter: Secondary | ICD-10-CM

## 2021-07-18 DIAGNOSIS — I5033 Acute on chronic diastolic (congestive) heart failure: Secondary | ICD-10-CM | POA: Diagnosis not present

## 2021-07-18 DIAGNOSIS — Z86711 Personal history of pulmonary embolism: Secondary | ICD-10-CM

## 2021-07-18 DIAGNOSIS — J90 Pleural effusion, not elsewhere classified: Secondary | ICD-10-CM

## 2021-07-18 DIAGNOSIS — J9601 Acute respiratory failure with hypoxia: Secondary | ICD-10-CM | POA: Diagnosis not present

## 2021-07-18 DIAGNOSIS — R338 Other retention of urine: Secondary | ICD-10-CM | POA: Diagnosis not present

## 2021-07-18 LAB — BASIC METABOLIC PANEL
Anion gap: 7 (ref 5–15)
BUN: 29 mg/dL — ABNORMAL HIGH (ref 8–23)
CO2: 34 mmol/L — ABNORMAL HIGH (ref 22–32)
Calcium: 8.7 mg/dL — ABNORMAL LOW (ref 8.9–10.3)
Chloride: 92 mmol/L — ABNORMAL LOW (ref 98–111)
Creatinine, Ser: 1.11 mg/dL — ABNORMAL HIGH (ref 0.44–1.00)
GFR, Estimated: 46 mL/min — ABNORMAL LOW (ref >60)
Glucose, Bld: 82 mg/dL (ref 70–99)
Potassium: 4 mmol/L (ref 3.5–5.1)
Sodium: 133 mmol/L — ABNORMAL LOW (ref 135–145)

## 2021-07-18 LAB — URINALYSIS, ROUTINE W REFLEX MICROSCOPIC
Bacteria, UA: NONE SEEN
Bilirubin Urine: NEGATIVE
Glucose, UA: NEGATIVE mg/dL
Ketones, ur: NEGATIVE mg/dL
Leukocytes,Ua: NEGATIVE
Nitrite: NEGATIVE
Protein, ur: NEGATIVE mg/dL
Specific Gravity, Urine: 1.01 (ref 1.005–1.030)
pH: 6 (ref 5.0–8.0)

## 2021-07-18 LAB — ECHOCARDIOGRAM COMPLETE
AR max vel: 1.76 cm2
AV Peak grad: 5.9 mmHg
Ao pk vel: 1.21 m/s
Area-P 1/2: 3.13 cm2
Height: 62 in
S' Lateral: 2.1 cm
Weight: 2217.6 oz

## 2021-07-18 LAB — PROCALCITONIN: Procalcitonin: <0.1 ng/mL

## 2021-07-18 LAB — MAGNESIUM: Magnesium: 1.8 mg/dL (ref 1.7–2.4)

## 2021-07-18 LAB — GLUCOSE, CAPILLARY: Glucose-Capillary: 99 mg/dL (ref 70–99)

## 2021-07-18 LAB — CORTISOL-AM, BLOOD: Cortisol - AM: 25.1 ug/dL — ABNORMAL HIGH (ref 6.7–22.6)

## 2021-07-18 MED ORDER — CHLORHEXIDINE GLUCONATE CLOTH 2 % EX PADS
6.0000 | MEDICATED_PAD | Freq: Every day | CUTANEOUS | Status: DC
  Administered 2021-07-18 – 2021-07-24 (×7): 6 via TOPICAL

## 2021-07-18 MED ORDER — SODIUM CHLORIDE 0.9 % IV SOLN
1.0000 g | Freq: Every day | INTRAVENOUS | Status: DC
  Administered 2021-07-18 – 2021-07-20 (×3): 1 g via INTRAVENOUS
  Filled 2021-07-18 (×3): qty 1

## 2021-07-18 MED ORDER — SODIUM CHLORIDE 0.9 % IV SOLN: 1.0000 g | INTRAVENOUS | Status: DC

## 2021-07-18 MED ORDER — SODIUM CHLORIDE 0.9 % IV SOLN: 3.0000 g | Freq: Two times a day (BID) | INTRAVENOUS | Status: DC

## 2021-07-18 MED ORDER — FUROSEMIDE 10 MG/ML IJ SOLN
40.0000 mg | Freq: Every day | INTRAMUSCULAR | Status: DC
  Administered 2021-07-18: 40 mg via INTRAVENOUS
  Filled 2021-07-18: qty 4

## 2021-07-18 MED ORDER — TAMSULOSIN HCL 0.4 MG PO CAPS
0.4000 mg | ORAL_CAPSULE | Freq: Every day | ORAL | Status: DC
  Administered 2021-07-18 – 2021-07-26 (×9): 0.4 mg via ORAL
  Filled 2021-07-18 (×9): qty 1

## 2021-07-18 NOTE — Assessment & Plan Note (Signed)
Mild hyponatremia, will follow.

## 2021-07-18 NOTE — Assessment & Plan Note (Addendum)
Thoracentesis removed 1.2 L of fluid.  Initial study showed transudates, but total cell count of 3182.  97% neutrophils.  This is not infectious, cytology has been sent out to rule out a malignancy.

## 2021-07-18 NOTE — Progress Notes (Signed)
Unable to obtain temp orally. Rectal temp 95.4. Notified MD. Place patient on blanket warmer ? ?

## 2021-07-18 NOTE — Progress Notes (Signed)
Patient has become more confused and mews have turned red. Notified charge nurse and MD. New order received. ?

## 2021-07-18 NOTE — Hospital Course (Signed)
Allison Novak is a 88 y.o. female with medical history significant of dCHF, hypertension, hyperlipidemia, TIA, GERD, hypothyroidism, gout, atrial fibrillation and PE on Eliquis, CKD-3A, carotid artery stenosis, anemia, CAD, who presents with shortness breath.  BNP 484, chest x-ray showed moderate right pleural effusion with associated atelectasis.  Mild interstitial edema.  Patient was placed on IV Lasix.  Foley catheter was anchored on 3/12 for severe urinary tension.

## 2021-07-18 NOTE — Assessment & Plan Note (Deleted)
Hemoglobin still stable. ?

## 2021-07-18 NOTE — Assessment & Plan Note (Signed)
Patient has chronic atrial fibrillation with history of stroke.  Continue Eliquis.

## 2021-07-18 NOTE — Progress Notes (Signed)
*  PRELIMINARY RESULTS* ?Echocardiogram ?2D Echocardiogram has been performed. ? ?Allison Novak ?07/18/2021, 11:59 AM ?

## 2021-07-18 NOTE — Assessment & Plan Note (Addendum)
Continue Flomax, UA study negative.  Continue Foley, plan to discontinue at time of discharge.

## 2021-07-18 NOTE — Assessment & Plan Note (Signed)
-   Continue Eliquis 

## 2021-07-18 NOTE — Assessment & Plan Note (Signed)
Continue home medicines.

## 2021-07-18 NOTE — Assessment & Plan Note (Addendum)
Temperature has improved.  Cortisol level 25.1, no adrenal insufficiency. ?

## 2021-07-18 NOTE — Progress Notes (Signed)
Patient had increased confusion, then spiked a fever. She had urinary retention this am. Most likely due to UTI. Will obtain blood cultures, ua and urine culture. Start rocephin. Talked with daughter, patient had unknown reaction to keflex, she does not remember what is the reaction, but does not seem to be severe. Will treat with unasyn for now.  ?Hold off additional dose of lasix due to losing of fluids due to fever.  ?

## 2021-07-18 NOTE — Assessment & Plan Note (Signed)
Renal function still stable.

## 2021-07-18 NOTE — Progress Notes (Addendum)
?  Progress Note ? ? ?Patient: Allison Novak LYY:[id number] DOB: 09-09-1925 DOA: 07/17/2021     1 ?DOS: the patient was seen and examined on 07/18/2021 ?  ?Brief hospital course: ?[name] is a 88 y.o. female with medical history significant of dCHF, hypertension, hyperlipidemia, TIA, GERD, hypothyroidism, gout, atrial fibrillation and PE on Eliquis, CKD-3A, carotid artery stenosis, anemia, CAD, who presents with shortness breath.  BNP 484, chest x-ray showed moderate right pleural effusion with associated atelectasis.  Mild interstitial edema.  Patient was placed on IV Lasix.  Foley catheter was anchored on 3/12 for severe urinary tension. ? ?Assessment and Plan: ?* Acute on chronic diastolic CHF (congestive heart failure) ([place]) ?Continue IV Lasix, ? ?Acute respiratory failure with hypoxia ([place]) ?This is secondary to acute on chronic diastolic congestive heart failure injunction with right side pleural effusion.  Continue IV Lasix. ? ?Pleural effusion on right ?Reviewed chest x-ray, compared with images performed in January.  Patient had an increased right-sided pleural effusion.  Will obtain thoracentesis.  Patient procalcitonin level less than 0.1, patient does not have pneumonia or parapneumonic effusion. ? ?Hypothermia ?Patient has a very lower core body temperature.  Does not seem to have any infection.  Check cortisol level to rule out adrenal insufficiency. ? ?History of pulmonary embolism ?Continue Eliquis. ? ?Acute urinary retention ?Patient has enlarged bladder on my exam, bladder scan showed residual of 1000 mL, Foley catheter is anchored.  We will start Flomax for bladder outlet obstruction ? ?Normocytic anemia ?Follow ? ?Chronic kidney disease, stage 3a ([place]) ?Renal function still stable. ? ?Hyponatremia ?Mild hyponatremia, will follow. ? ?Atrial fibrillation, chronic ([place]) ?Patient has chronic atrial fibrillation with history of stroke.  Continue Eliquis. ? ?History of TIA (transient ischemic  attack) ?Continue home medicines. ? ? ? ? ?  ? ?Subjective:  ?Patient still on 2 L oxygen with short of breath on exertion. ?Cough, nonproductive. ?No abdominal pain or nausea vomiting. ? ?Physical Exam: ?Vitals:  ? 07/18/21 0414 07/18/21 0500 07/18/21 0839 07/18/21 1145  ?BP: 107/70  111/76 100/66  ?Pulse: (!) 101  90 93  ?Resp: '18  18 18  '$ ?Temp: (!) 97.4 ?F (36.3 ?C)   (!) 95.4 ?F (35.2 ?C)  ?TempSrc:    Rectal  ?SpO2: 100%  98% 99%  ?Weight:  62.9 kg    ?Height:      ? ?General exam: Appears calm and comfortable  ?Respiratory system: Crackles on the right.Marland Kitchen Respiratory effort normal. ?Cardiovascular system: irregular No JVD, murmurs, rubs, gallops or clicks. No pedal edema. ?Gastrointestinal system: Abdomen is nondistended, soft and nontender. No organomegaly or masses felt. Normal bowel sounds heard. ?Central nervous system: Alert and oriented x2. No focal neurological deficits. ?Extremities: Symmetric 5 x 5 power. ?Skin: No rashes, lesions or ulcers ?Psychiatry: Judgement and insight appear normal. Mood & affect appropriate.  ? ?Data Reviewed: ?Reviewed chest x-ray images performed on today and in January.  Reviewed all labs. ? ?Family Communication: Updated at the bedside. ? ?Disposition: ?Status is: Inpatient ?Remains inpatient appropriate because: Active disease, IV antibiotics. ? Planned Discharge Destination: Home with Home Health ? ? ? ?Time spent: 45 minutes ? ?Author: ?Sharen Hones, MD ?07/18/2021 11:57 AM ? ?For on call review [email or web address].  ?

## 2021-07-18 NOTE — Assessment & Plan Note (Signed)
This is secondary to acute on chronic diastolic congestive heart failure injunction with right side pleural effusion.  Continue IV Lasix.

## 2021-07-18 NOTE — Assessment & Plan Note (Addendum)
Resume IV Lasix.  Follow electrolytes closely.

## 2021-07-19 ENCOUNTER — Inpatient Hospital Stay: Payer: Medicare Other

## 2021-07-19 DIAGNOSIS — I5033 Acute on chronic diastolic (congestive) heart failure: Secondary | ICD-10-CM | POA: Diagnosis not present

## 2021-07-19 DIAGNOSIS — R338 Other retention of urine: Secondary | ICD-10-CM | POA: Diagnosis not present

## 2021-07-19 DIAGNOSIS — D696 Thrombocytopenia, unspecified: Secondary | ICD-10-CM

## 2021-07-19 DIAGNOSIS — A419 Sepsis, unspecified organism: Secondary | ICD-10-CM

## 2021-07-19 DIAGNOSIS — J9601 Acute respiratory failure with hypoxia: Secondary | ICD-10-CM | POA: Diagnosis not present

## 2021-07-19 LAB — CBC WITH DIFFERENTIAL/PLATELET
Abs Immature Granulocytes: 0.01 10*3/uL (ref 0.00–0.07)
Basophils Absolute: 0 10*3/uL (ref 0.0–0.1)
Basophils Relative: 1 %
Eosinophils Absolute: 0.2 10*3/uL (ref 0.0–0.5)
Eosinophils Relative: 3 %
HCT: 26 % — ABNORMAL LOW (ref 36.0–46.0)
Hemoglobin: 8 g/dL — ABNORMAL LOW (ref 12.0–15.0)
Immature Granulocytes: 0 %
Lymphocytes Relative: 25 %
Lymphs Abs: 1.2 10*3/uL (ref 0.7–4.0)
MCH: 26 pg (ref 26.0–34.0)
MCHC: 30.8 g/dL (ref 30.0–36.0)
MCV: 84.4 fL (ref 80.0–100.0)
Monocytes Absolute: 0.9 10*3/uL (ref 0.1–1.0)
Monocytes Relative: 17 %
Neutro Abs: 2.7 10*3/uL (ref 1.7–7.7)
Neutrophils Relative %: 54 %
Platelets: 117 10*3/uL — ABNORMAL LOW (ref 150–400)
RBC: 3.08 MIL/uL — ABNORMAL LOW (ref 3.87–5.11)
RDW: 25.6 % — ABNORMAL HIGH (ref 11.5–15.5)
Smear Review: NORMAL
WBC: 4.9 10*3/uL (ref 4.0–10.5)
nRBC: 0 % (ref 0.0–0.2)

## 2021-07-19 LAB — BASIC METABOLIC PANEL
Anion gap: 6 (ref 5–15)
BUN: 24 mg/dL — ABNORMAL HIGH (ref 8–23)
CO2: 33 mmol/L — ABNORMAL HIGH (ref 22–32)
Calcium: 8.5 mg/dL — ABNORMAL LOW (ref 8.9–10.3)
Chloride: 93 mmol/L — ABNORMAL LOW (ref 98–111)
Creatinine, Ser: 1.09 mg/dL — ABNORMAL HIGH (ref 0.44–1.00)
GFR, Estimated: 47 mL/min — ABNORMAL LOW (ref >60)
Glucose, Bld: 74 mg/dL (ref 70–99)
Potassium: 3.8 mmol/L (ref 3.5–5.1)
Sodium: 132 mmol/L — ABNORMAL LOW (ref 135–145)

## 2021-07-19 LAB — BODY FLUID CELL COUNT WITH DIFFERENTIAL
Eos, Fluid: 0 %
Lymphs, Fluid: 60 %
Monocyte-Macrophage-Serous Fluid: 33 %
Neutrophil Count, Fluid: 7 %
Total Nucleated Cell Count, Fluid: 3182 cu mm

## 2021-07-19 LAB — PROTEIN, PLEURAL OR PERITONEAL FLUID: Total protein, fluid: <3 g/dL

## 2021-07-19 LAB — MAGNESIUM: Magnesium: 1.9 mg/dL (ref 1.7–2.4)

## 2021-07-19 LAB — LACTATE DEHYDROGENASE, PLEURAL OR PERITONEAL FLUID: LD, Fluid: 56 U/L — ABNORMAL HIGH (ref 3–23)

## 2021-07-19 LAB — GLUCOSE, PLEURAL OR PERITONEAL FLUID: Glucose, Fluid: 97 mg/dL

## 2021-07-19 MED ORDER — APIXABAN 5 MG PO TABS
5.0000 mg | ORAL_TABLET | Freq: Two times a day (BID) | ORAL | Status: DC
  Administered 2021-07-19 – 2021-07-26 (×14): 5 mg via ORAL
  Filled 2021-07-19 (×14): qty 1

## 2021-07-19 MED ORDER — FUROSEMIDE 10 MG/ML IJ SOLN
40.0000 mg | Freq: Every day | INTRAMUSCULAR | Status: DC
  Administered 2021-07-19 – 2021-07-21 (×3): 40 mg via INTRAVENOUS
  Filled 2021-07-19 (×3): qty 4

## 2021-07-19 NOTE — Progress Notes (Signed)
Inpatient Rehab Admissions Coordinator:  ° °Per therapy recommendation,  patient was screened for CIR candidacy by Adessa Primiano, MS, CCC-SLP. At this time, Pt. Appears to be a a potential candidate for CIR. I will place   order for rehab consult per protocol for full assessment. Please contact me any with questions. ° °Tigran Haynie, MS, CCC-SLP °Rehab Admissions Coordinator  °[phone] (celll) °[phone] (office) ° °

## 2021-07-19 NOTE — Assessment & Plan Note (Signed)
Mild thrombocytopenia.  Patient is not on heparin.  Continue to follow.

## 2021-07-19 NOTE — Evaluation (Signed)
Occupational Therapy Evaluation Patient Details Name: Allison Novak MRN: [id number] DOB: Jan 22, 1926 Today's Date: 07/19/2021   History of Present Illness 88 y.o. female with medical history significant of dCHF, hypertension, hyperlipidemia, TIA, GERD, hypothyroidism, gout, atrial fibrillation and PE on Eliquis, CKD-3A, carotid artery stenosis, anemia, CAD, who presents with shortness breath.  BNP 484, chest x-ray showed moderate right pleural effusion with associated atelectasis.  Mild interstitial edema.  Patient was placed on IV Lasix.  Foley catheter was anchored on 3/12 for severe urinary tension   Clinical Impression   Patient presenting with decreased Ind in self care,balance, functional mobility/transfers, endurance, and safety awareness. Patient reports living at home alone with use of RW for mobility at baseline. Her son is in and out multiple times each day but she does not have 24/7 assistance. Pt does not drive and family assists with getting her to appointments/groceries but for the most part she is able to care for herself at home. Patient currently functioning at min A for bed mobility and needing mod lifting assist to stand from EOB with posterior bias in standing. Pt reports feeling weak with standing and takes side steps towards the R with RW and min A. OT able to decrease O2 requirements from 3Ls to 1L with O2 saturation remaining at or above 92%. Pt does fatigue quickly with tasks.  Patient will benefit from acute OT to increase overall independence in the areas of ADLs, functional mobility, and safety awareness in order to safely discharge to next venue of care.   Pt asymptomatic throughout session: Supine: 93/53 EOB: 94/60 Standing: 102/60     Recommendations for follow up therapy are one component of a multi-disciplinary discharge planning process, led by the attending physician.  Recommendations may be updated based on patient status, additional functional criteria and  insurance authorization.   Follow Up Recommendations  Skilled nursing-short term rehab (<3 hours/day)    Assistance Recommended at Discharge Frequent or constant Supervision/Assistance  Patient can return home with the following A lot of help with walking and/or transfers;A lot of help with bathing/dressing/bathroom;Assistance with cooking/housework;Help with stairs or ramp for entrance;Assist for transportation    Functional Status Assessment  Patient has had a recent decline in their functional status and demonstrates the ability to make significant improvements in function in a reasonable and predictable amount of time.  Equipment Recommendations  Other (comment) (defer to next venue of care)       Precautions / Restrictions Precautions Precautions: Fall      Mobility Bed Mobility Overal bed mobility: Needs Assistance Bed Mobility: Supine to Sit, Sit to Supine     Supine to sit: Min assist Sit to supine: Min assist   General bed mobility comments: min A for trunk support to EOB    Transfers Overall transfer level: Needs assistance Equipment used: Rolling walker (2 wheels) Transfers: Sit to/from Stand Sit to Stand: Mod assist           General transfer comment: mod lifting assist to stand from EOB and min A for side steps      Balance Overall balance assessment: Needs assistance Sitting-balance support: Feet supported, Bilateral upper extremity supported Sitting balance-Leahy Scale: Good     Standing balance support: Reliant on assistive device for balance, During functional activity, Bilateral upper extremity supported Standing balance-Leahy Scale: Poor                             ADL  either performed or assessed with clinical judgement   ADL Overall ADL's : Needs assistance/impaired     Grooming: Wash/dry hands;Wash/dry face;Sitting;Set up;Supervision/safety               Lower Body Dressing: Moderate assistance                Functional mobility during ADLs: Minimal assistance;Moderate assistance;Rolling walker (2 wheels)       Vision Patient Visual Report: No change from baseline              Pertinent Vitals/Pain Pain Assessment Pain Assessment: No/denies pain     Hand Dominance Right   Extremity/Trunk Assessment Upper Extremity Assessment Upper Extremity Assessment: Generalized weakness   Lower Extremity Assessment Lower Extremity Assessment: Generalized weakness       Communication Communication Communication: No difficulties   Cognition Arousal/Alertness: Awake/alert Behavior During Therapy: WFL for tasks assessed/performed Overall Cognitive Status: Within Functional Limits for tasks assessed                                                  Home Living Family/patient expects to be discharged to:: Private residence Living Arrangements: Alone Available Help at Discharge: Family;Available PRN/intermittently Type of Home: House Home Access: Ramped entrance     Home Layout: Able to live on main level with bedroom/bathroom;Two level;Laundry or work area in Artist of Steps: 12 Alternate Level Stairs-Rails: Left Bathroom Shower/Tub: Chief Strategy Officer: Standard     Home Equipment: Agricultural consultant (2 wheels);Cane - single point;Wheelchair - manual;Grab bars - tub/shower;Shower seat;BSC/3in1          Prior Functioning/Environment Prior Level of Function : Independent/Modified Independent             Mobility Comments: Pt has been ambulating with RW at home. ADLs Comments: Typically independent with ADLs, but over the past couple of days has required increased time/effort d/t experiencing SOB with minimal activity. Her son is "in and out" throughout the day with farming.        OT Problem List: Decreased strength;Decreased activity tolerance;Impaired balance (sitting and/or standing);Decreased knowledge of  use of DME or AE;Decreased safety awareness;Cardiopulmonary status limiting activity      OT Treatment/Interventions: Self-care/ADL training;Therapeutic exercise;Therapeutic activities;Energy conservation;DME and/or AE instruction;Manual therapy;Balance training;Patient/family education    OT Goals(Current goals can be found in the care plan section) Acute Rehab OT Goals Patient Stated Goal: to go home OT Goal Formulation: With patient/family Time For Goal Achievement: 08/02/21 Potential to Achieve Goals: Fair ADL Goals Pt Will Perform Grooming: with supervision;standing Pt Will Perform Lower Body Dressing: with min guard assist;sit to/from stand Pt Will Transfer to Toilet: with min guard assist;ambulating Pt Will Perform Toileting - Clothing Manipulation and hygiene: with min guard assist;sit to/from stand  OT Frequency: Min 2X/week       AM-PAC OT "6 Clicks" Daily Activity     Outcome Measure Help from another person eating meals?: None Help from another person taking care of personal grooming?: A Little Help from another person toileting, which includes using toliet, bedpan, or urinal?: A Lot Help from another person bathing (including washing, rinsing, drying)?: A Lot Help from another person to put on and taking off regular upper body clothing?: A Little Help from another person to put on and taking off regular lower body clothing?:  A Lot 6 Click Score: 16   End of Session Equipment Utilized During Treatment: Rolling walker (2 wheels) Nurse Communication: Mobility status;Other (comment) (hypotension)  Activity Tolerance: Patient tolerated treatment well Patient left: in bed;with call bell/phone within reach;with bed alarm set;with family/visitor present;with nursing/sitter in room  OT Visit Diagnosis: Unsteadiness on feet (R26.81);Muscle weakness (generalized) (M62.81);History of falling (Z91.81)                Time: 1610-9604 OT Time Calculation (min): 29 min Charges:  OT  General Charges $OT Visit: 1 Visit OT Evaluation $OT Eval Moderate Complexity: 1 Mod OT Treatments $Self Care/Home Management : 8-22 mins $Therapeutic Activity: 8-22 mins  Jackquline Denmark, MS, OTR/L , CBIS ascom [phone]  07/19/21, 1:26 PM

## 2021-07-19 NOTE — Procedures (Signed)
PROCEDURE SUMMARY: ? ?Successful US guided right thoracentesis. ?Yielded 1200 of clear yellow fluid. ?Pt tolerated procedure well. ?No immediate complications. ? ?Specimen was sent for labs. ?CXR ordered. ? ?EBL < 1 mL ? ?Tsosie Billing D PA-C ?07/19/2021 ?10:07 AM ? ? ? ?

## 2021-07-19 NOTE — Progress Notes (Signed)
?  Progress Note ? ? ?Patient: Allison Novak OBS:[id number] DOB: 16-Sep-1925 DOA: 07/17/2021     2 ?DOS: the patient was seen and examined on 07/19/2021 ?  ?Brief hospital course: ?[name] is a 88 y.o. female with medical history significant of dCHF, hypertension, hyperlipidemia, TIA, GERD, hypothyroidism, gout, atrial fibrillation and PE on Eliquis, CKD-3A, carotid artery stenosis, anemia, CAD, who presents with shortness breath.  BNP 484, chest x-ray showed moderate right pleural effusion with associated atelectasis.  Mild interstitial edema.  Patient was placed on IV Lasix.  Foley catheter was anchored on 3/12 for severe urinary tension. ? ?Assessment and Plan: ?* Acute on chronic diastolic CHF (congestive heart failure) ([place]) ?Resume IV Lasix.  Follow electrolytes closely. ? ?Acute respiratory failure with hypoxia ([place]) ?This is secondary to acute on chronic diastolic congestive heart failure injunction with right side pleural effusion.  Continue IV Lasix. ? ?Thrombocytopenia ([place]) ?Mild thrombocytopenia.  Patient is not on heparin.  Continue to follow. ? ?Sepsis ([place]) ?Not POA. ?Patient spiked a fever on 3/12 with tachycardia.  Chest x-ray did not still show pneumonia, UA was normal.  This appears to be secondary to severe urinary retention.  I will complete 3 days of antibiotics.  Blood cultures pending. ? ?Pleural effusion on right ?Thoracentesis removed 1.2 L of fluid.  Initial study showed transudates, but total cell count of 3182.  97% neutrophils.  This is not infectious, cytology has been sent out to rule out a malignancy. ? ?Hypothermia ?Temperature has improved.  Cortisol level 25.1, no adrenal insufficiency. ? ?History of pulmonary embolism ?Continue Eliquis. ? ?Acute urinary retention ?Continue Flomax, UA study negative.  Continue Foley, plan to discontinue at time of discharge. ? ?Normocytic anemia ?Hemoglobin still stable. ? ?Chronic kidney disease, stage 3a ([place]) ?Renal function still  stable. ? ?Hyponatremia ?Mild hyponatremia, will follow. ? ?Atrial fibrillation, chronic ([place]) ?Patient has chronic atrial fibrillation with history of stroke.  Continue Eliquis. ? ?History of TIA (transient ischemic attack) ?Continue home medicines. ? ? ? ? ?  ? ?Subjective:  ?Patient had a significant confusion yesterday and spiked a fever.  She was treated with Rocephin for possible UTI. ?Patient doing better today, ?Denies any short of breath or cough. ?No abdominal pain or nausea vomiting. ? ?Physical Exam: ?Vitals:  ? 07/19/21 1101 07/19/21 1102 07/19/21 1103 07/19/21 1104  ?BP: 98/68     ?Pulse: 86 99 (!) 105 (!) 109  ?Resp:      ?Temp:      ?TempSrc:      ?SpO2: 94% 94% 95% 95%  ?Weight:      ?Height:      ? ?General exam: Appears calm and comfortable  ?Respiratory system: Clear to auscultation. Respiratory effort normal. ?Cardiovascular system: S1 & S2 heard, RRR. No JVD, murmurs, rubs, gallops or clicks. No pedal edema. ?Gastrointestinal system: Abdomen is nondistended, soft and nontender. No organomegaly or masses felt. Normal bowel sounds heard. ?Central nervous system: Alert and oriented. No focal neurological deficits. ?Extremities: Symmetric 5 x 5 power. ?Skin: No rashes, lesions or ulcers ?Psychiatry: Judgement and insight appear normal. Mood & affect appropriate.  ? ?Data Reviewed: ?Reviewed chest x-ray, all lab results. ? ?Family Communication: Daughter updated the bedside. ? ?Disposition: ?Status is: Inpatient ?Remains inpatient appropriate because: Of disease, IV treatment. ? Planned Discharge Destination:  Pending ? ? ? ?Time spent: 28 minutes ? ?Author: ?Sharen Hones, MD ?07/19/2021 12:32 PM ? ?For on call review [email or web address].  ?

## 2021-07-19 NOTE — Assessment & Plan Note (Signed)
Not POA. Patient spiked a fever on 3/12 with tachycardia.  Chest x-ray did not still show pneumonia, UA was normal.  This appears to be secondary to severe urinary retention.  I will complete 3 days of antibiotics.  Blood cultures pending.

## 2021-07-19 NOTE — Consult Note (Signed)
? ?  Heart Failure Nurse Navigator Note ? ?HFpEF 55 to 60%.  Left ventricular diastolic parameters were indeterminate.  Right ventricular systolic function is moderately reduced.  Moderate elevated pulmonary artery systolic pressures.  Moderate mitral regurgitation.  Moderate to severe tricuspid regurgitation. ? ?She presented to the emergency room for complaints of worsening shortness of breath. ? ?Comorbidities: ? ?Coronary artery disease ?Hypertension ?Atrial fibrillation ?Anemia ?Chronic kidney disease ?Hyperlipidemia ?GERD ?Osteoporosis ? ? ?Labs: ? ?Sodium 132, potassium 3.8, chloride 93, CO2 33, BUN 24, creatinine 1.09, GFR 47 ?Weight is 67.2 kg ?Blood pressure 98/68 ?Intake 513 mL ?Output 1475 mL ? ?Medications: ? ?Atorvastatin 40 mg every evening ?Furosemide 40 mg IV daily ?Levothyroxine 25 mcg daily ?Metoprolol succinate 25 mg daily ?Tamsulosin 0.4 mg daily ?Apixaban 5 mg 2 times a day ? ?Initial meeting with patient and her daughter and son who are at the bedside. ? ?She states that she is trying to read labels, daughter does grocery shopping.   She does not use salt at the table.  She states she had recently seen her PCP and sodium was low, he recommended she cut the fluid intake to 1500 ml. ? ?Discussed daily weight and what to report.  Also changes in symptoms.  She states she had not seen a change in her weight at home. ? ?Will continue to follow. ? ?Pricilla Riffle RN [facility] ?

## 2021-07-19 NOTE — Evaluation (Signed)
Physical Therapy Evaluation ?Patient Details ?Name: Allison Novak ?MRN: [id number] ?DOB: [birth year] ?Today's Date: 07/19/2021 ? ?History of Present Illness ? Pt is a 88 y/o F admitted on 07/17/21 after presenting with c/o SOB. Chest x-ray showed moderate right pleural effusion with associated atelectasis. Pt had thoracentsis on 07/19/21 which yielded 1200 of clear yellow fluid. PMH: dCHF, HTN, HLD, TIA, GERD, hypothyroidism, gout, a-fib & PE on eliquis, CKD-3A, carotid artery stenosis, anemia, CAD  ?Clinical Impression ? Pt seen for PT evaluation with daughter present but exiting shortly after PT arrival. Home information obtained from chart. Pt is able to complete bed mobility with supervision with [facility] elevated & bed rails & min assist for transfers & to ambulate 3 ft forwards + 3 ft backwards. Pt requires 1L/min supplemental O2 during session. Pt has experienced a functional decline compared to her mod I baseline & would benefit from STR upon d/c to maximize independence with functional mobility & reduce fall risk prior to return home. ? ?Pt received on 1L/min via nasal cannula, SpO2 >90% ?Pt placed on room air with SpO2 reading as low as 82% after transitioning to sitting EOB but question accuracy of this as pt denies SOB & SpO2 quickly increases to 89% but pt placed back on 1L/min for remainder of session & SpO2 >/= 90% ? ?BP checked in RUE:  ?Supine 96/60 mmHg MAP 71 ?Sitting EOB: 100/63 mmHg ?Sitting in recliner at end of session: 103/67 mmHg MAP 77   ?   ? ?Recommendations for follow up therapy are one component of a multi-disciplinary discharge planning process, led by the attending physician.  Recommendations may be updated based on patient status, additional functional criteria and insurance authorization. ? ?Follow Up Recommendations Acute inpatient rehab (3hours/day) ? ?  ?Assistance Recommended at Discharge Frequent or constant Supervision/Assistance  ?Patient can return home with the following ? A little  help with walking and/or transfers;A little help with bathing/dressing/bathroom;Assistance with cooking/housework;Direct supervision/assist for financial management;Assist for transportation;Help with stairs or ramp for entrance ? ?  ?Equipment Recommendations None recommended by PT  ?Recommendations for Other Services ?    ?  ?Functional Status Assessment Patient has had a recent decline in their functional status and demonstrates the ability to make significant improvements in function in a reasonable and predictable amount of time.  ? ?  ?Precautions / Restrictions Precautions ?Precautions: Fall ?Restrictions ?Weight Bearing Restrictions: No  ? ?  ? ?Mobility ? Bed Mobility ?Overal bed mobility: Needs Assistance ?Bed Mobility: Supine to Sit ?  ?  ?Supine to sit: Supervision, HOB elevated ?  ?  ?General bed mobility comments: extra time & use of bed rails PRN ?  ? ?Transfers ?Overall transfer level: Needs assistance ?Equipment used: Rolling walker (2 wheels) ?Transfers: Sit to/from Stand, Bed to chair/wheelchair/BSC ?Sit to Stand: Min assist ?  ?Step pivot transfers: Min assist ?  ?  ?  ?  ?  ? ?Ambulation/Gait ?Ambulation/Gait assistance: Min assist ?Gait Distance (Feet):  (3 ft forwards + 3 ft backwards) ?Assistive device: Rolling walker (2 wheels) ?Gait Pattern/deviations: Decreased step length - right, Decreased step length - left, Decreased stride length ?Gait velocity: decreased ?  ?  ?  ? ?Stairs ?  ?  ?  ?  ?  ? ?Wheelchair Mobility ?  ? ?Modified Rankin (Stroke Patients Only) ?  ? ?  ? ?Balance Overall balance assessment: Needs assistance ?Sitting-balance support: Feet supported, Bilateral upper extremity supported ?Sitting balance-Leahy Scale: Good ?  ?  ?Standing  balance support: Reliant on assistive device for balance, During functional activity, Bilateral upper extremity supported ?Standing balance-Leahy Scale: Fair ?  ?  ?  ?  ?  ?  ?  ?  ?  ?  ?  ?  ?   ? ? ? ?Pertinent Vitals/Pain Pain  Assessment ?Pain Assessment: No/denies pain  ? ? ?Home Living Family/patient expects to be discharged to:: Private residence ?Living Arrangements: Alone ?Available Help at Discharge: Family;Available PRN/intermittently ?Type of Home: House ?Home Access: Ramped entrance ?  ?  ?Alternate Level Stairs-Number of Steps: 12 ?Home Layout: Able to live on main level with bedroom/bathroom;Two level;Laundry or work area in basement ?Home Equipment: Conservation officer, nature (2 wheels);Cane - single point;Wheelchair - manual;Grab bars - tub/shower;Shower seat;BSC/3in1 ?   ?  ?Prior Function Prior Level of Function : Independent/Modified Independent ?  ?  ?  ?  ?  ?  ?Mobility Comments: Pt has been ambulating with RW at home. ?ADLs Comments: Typically independent with ADLs, but over the past couple of days has required increased time/effort d/t experiencing SOB with minimal activity. Her son is "in and out" throughout the day with farming. ?  ? ? ?Hand Dominance  ? Dominant Hand: Right ? ?  ?Extremity/Trunk Assessment  ? Upper Extremity Assessment ?Upper Extremity Assessment: Generalized weakness ?  ? ?Lower Extremity Assessment ?Lower Extremity Assessment: Generalized weakness ?  ? ?Cervical / Trunk Assessment ?Cervical / Trunk Assessment:  (rounded shoulders)  ?Communication  ? Communication: No difficulties  ?Cognition Arousal/Alertness: Awake/alert ?Behavior During Therapy: [facility] for tasks assessed/performed ?Overall Cognitive Status: Within Functional Limits for tasks assessed ?  ?  ?  ?  ?  ?  ?  ?  ?  ?  ?  ?  ?  ?  ?  ?  ?General Comments: Pleasant lady ?  ?  ? ?  ?General Comments   ? ?  ?Exercises General Exercises - Lower Extremity ?Long Arc Quad: AROM, Strengthening, Both, 10 reps, Seated  ? ?Assessment/Plan  ?  ?PT Assessment Patient needs continued PT services  ?PT Problem List Decreased strength;Decreased mobility;Decreased activity tolerance;Cardiopulmonary status limiting activity;Decreased balance;Decreased knowledge of  use of DME ? ?   ?  ?PT Treatment Interventions DME instruction;Therapeutic exercise;Gait training;Balance training;Stair training;Neuromuscular re-education;Functional mobility training;Therapeutic activities;Patient/family education;Modalities   ? ?PT Goals (Current goals can be found in the Care Plan section)  ?Acute Rehab PT Goals ?Patient Stated Goal: get better ?PT Goal Formulation: With patient ?Time For Goal Achievement: 08/02/21 ?Potential to Achieve Goals: Good ? ?  ?Frequency Min 2X/week ?  ? ? ?Co-evaluation   ?  ?  ?  ?  ? ? ?  ?AM-PAC PT "6 Clicks" Mobility  ?Outcome Measure Help needed turning from your back to your side while in a flat bed without using bedrails?: None ?Help needed moving from lying on your back to sitting on the side of a flat bed without using bedrails?: A Little ?Help needed moving to and from a bed to a chair (including a wheelchair)?: A Little ?Help needed standing up from a chair using your arms (e.g., wheelchair or bedside chair)?: A Little ?Help needed to walk in hospital room?: A Little ?Help needed climbing 3-5 steps with a railing? : A Lot ?6 Click Score: 18 ? ?  ?End of Session Equipment Utilized During Treatment: Oxygen ?Activity Tolerance: Patient tolerated treatment well ?Patient left: in chair;with chair alarm set;with call bell/phone within reach ?Nurse Communication: Mobility status (O2) ?PT Visit Diagnosis:  Muscle weakness (generalized) (M62.81);Difficulty in walking, not elsewhere classified (R26.2);Unsteadiness on feet (R26.81) ?  ? ?Time: 4315-4008 ?PT Time Calculation (min) (ACUTE ONLY): 24 min ? ? ?Charges:   PT Evaluation ?$PT Eval Moderate Complexity: 1 Mod ?PT Treatments ?$Therapeutic Activity: 8-22 mins ?  ?   ? ? ?Lavone Nian, PT, DPT ?07/19/21, 3:18 PM ? ? ?Waunita Schooner ?07/19/2021, 3:16 PM ? ?

## 2021-07-20 DIAGNOSIS — I5033 Acute on chronic diastolic (congestive) heart failure: Secondary | ICD-10-CM | POA: Diagnosis not present

## 2021-07-20 DIAGNOSIS — J9601 Acute respiratory failure with hypoxia: Secondary | ICD-10-CM | POA: Diagnosis not present

## 2021-07-20 DIAGNOSIS — I272 Pulmonary hypertension, unspecified: Secondary | ICD-10-CM

## 2021-07-20 DIAGNOSIS — R338 Other retention of urine: Secondary | ICD-10-CM | POA: Diagnosis not present

## 2021-07-20 LAB — MISC LABCORP TEST (SEND OUT): Labcorp test code: 5367

## 2021-07-20 LAB — PROTEIN, BODY FLUID (OTHER): Total Protein, Body Fluid Other: 2 g/dL

## 2021-07-20 LAB — CYTOLOGY - NON PAP

## 2021-07-20 LAB — URINE CULTURE: Culture: NO GROWTH

## 2021-07-20 NOTE — Progress Notes (Signed)
Inpatient Rehab Admissions Coordinator:  ? ?Spoke to pt and daughter over the phone.  Pt very HOH so I spoke mostly with her daughter.  I reviewed CIR recommendations and goals/expectations of CIR stay.  I reviewed 3 hrs/day of therapy, physiatrist follow up, average length of stay 2 weeks, and goals likely mod I to supervision.  Dtr would like to discuss with family to determine SNF closer to home versus CIR and will call me back.  Also asked CSW to come discuss same.  Will follow.  ? ?Shann Medal, PT, DPT ?Admissions Coordinator ?[phone] ?07/20/21  ?3:27 PM ? ?

## 2021-07-20 NOTE — Care Management Important Message (Signed)
Important Message ? ?Patient Details  ?Name: Allison Novak ?MRN: [id number] ?Date of Birth: Dec 16, 1925 ? ? ?Medicare Important Message Given:  N/A - LOS <3 / Initial given by admissions ? ? ? ? ?Dannette Barbara ?07/20/2021, 8:51 AM ?

## 2021-07-20 NOTE — Progress Notes (Addendum)
Mobility Specialist - Progress Note ? ? 07/20/21 1700  ?Mobility  ?Activity Transferred from chair to bed;Turned to right side;Turned to left side;Turned to back - supine  ?Level of Assistance Minimal assist, patient does 75% or more  ?Assistive Device Other (Comment)  ?Distance Ambulated (ft) 2 ft  ?Activity Response Tolerated well  ?$Mobility charge 1 Mobility  ? ? ? ?Pt transferred chair-bed with minA to stand from recliner. Pt returned supine and rolled L/R for pillow wedge support on buttocks. O2 mid 90s on 1L post-session with HR upper 90s-low 100s. Pt left in bed with alarm set, family at bedside.  ? ? ?Kathee Delton ?Mobility Specialist ?07/20/21, 5:09 PM ? ? ? ? ?

## 2021-07-20 NOTE — Progress Notes (Signed)
?Progress Note ? ? ?Patient: Allison Novak HUD:[id number] DOB: [birth year] DOA: 07/17/2021     3 ?DOS: the patient was seen and examined on 07/20/2021 ?  ?Brief hospital course: ?Allison Novak is a 88 y.o. female with medical history significant of dCHF, hypertension, hyperlipidemia, TIA, GERD, hypothyroidism, gout, atrial fibrillation and PE on Eliquis, CKD-3A, carotid artery stenosis, anemia, CAD, who presents with shortness breath.  BNP 484, chest x-ray showed moderate right pleural effusion with associated atelectasis.  Mild interstitial edema.  Patient was placed on IV Lasix.  Foley catheter was anchored on 3/12 for severe urinary tension. ? ?Assessment and Plan: ?* Acute on chronic diastolic CHF (congestive heart failure) ([place]) ?Echocardiogram showed ejection fraction 55 to 60% with moderate pulmonary hypertension. ?Patient condition seem to be improving, continue IV Lasix daily. ? ?Acute respiratory failure with hypoxia ([place]) ?This is secondary to acute on chronic diastolic congestive heart failure injunction with right side pleural effusion.  Continue IV Lasix. ?Patient currently using 1 L oxygen, will wean off. ? ?Thrombocytopenia ([place]) ?Mild thrombocytopenia.  Patient is not on heparin.  Continue to follow. ? ?Sepsis ([place]) ?Not POA. ?Patient spiked a fever on 3/12 with tachycardia.  Chest x-ray did not still show pneumonia, UA was normal.  This appears to be secondary to severe urinary retention.  Blood culture negative.  Completed 3 days antibiotics with Rocephin. ? ?Pleural effusion on right ?Thoracentesis removed 1.2 L of fluid.  Initial study showed transudates, but total cell count of 3182.  7% neutrophils.  This is not infectious, cytology is still pending. ? ?Hypothermia ?Temperature has improved.  Cortisol level 25.1, no adrenal insufficiency. ? ?History of pulmonary embolism ?Continue Eliquis. ? ?Acute urinary retention ?Continue Foley catheter, continue Flomax. ?May consider remove Foley catheter  in 1 to 2 days. ? ?Normocytic anemia ?Hemoglobin still stable. ? ?Chronic kidney disease, stage 3a ([place]) ?Renal function still stable. ? ?Hyponatremia ?Mild hyponatremia, will follow. ? ?Atrial fibrillation, chronic ([place]) ?Patient has chronic atrial fibrillation with history of stroke.  Continue Eliquis. ? ?History of TIA (transient ischemic attack) ?Continue home medicines. ? ? ? ? ?  ? ?Subjective:  ?Patient doing much better today.  On 1 L oxygen, short of breath is improving. ?No abdominal pain or nausea vomiting. ? ?Physical Exam: ?Vitals:  ? 07/19/21 1700 07/19/21 1942 07/19/21 2356 07/20/21 0500  ?BP: 114/65 (!) 107/58 (!) 94/53 105/63  ?Pulse: (!) 101 92 95 90  ?Resp:  '16 17 18  '$ ?Temp:  98 ?F (36.7 ?C) 97.7 ?F (36.5 ?C) 98.1 ?F (36.7 ?C)  ?TempSrc:  Oral Oral Oral  ?SpO2: 100% 98% 96% 96%  ?Weight:    61.7 kg  ?Height:      ? ?General exam: Appears calm and comfortable  ?Respiratory system: Clear to auscultation. Respiratory effort normal. ?Cardiovascular system: Irregularly irregular.  No JVD, murmurs, rubs, gallops or clicks. No pedal edema. ?Gastrointestinal system: Abdomen is nondistended, soft and nontender. No organomegaly or masses felt. Normal bowel sounds heard. ?Central nervous system: Alert and oriented x2. No focal neurological deficits. ?Extremities: Symmetric 5 x 5 power. ?Skin: No rashes, lesions or ulcers ?Psychiatry: Judgement and insight appear normal. Mood & affect appropriate.  ? ?Data Reviewed: ?Reviewed all lab results. ? ?Family Communication: Daughter updated at bedside. ? ?Disposition: ?Status is: Inpatient ?Remains inpatient appropriate because: Severity of disease and IV treatment. ? Planned Discharge Destination:  CIR ? ? ? ?Time spent: 32 minutes ? ?Author: ?Sharen Hones, MD ?07/20/2021 12:27 PM ? ?For on  call review [email or web address].  ?

## 2021-07-21 DIAGNOSIS — I5033 Acute on chronic diastolic (congestive) heart failure: Secondary | ICD-10-CM | POA: Diagnosis not present

## 2021-07-21 DIAGNOSIS — R338 Other retention of urine: Secondary | ICD-10-CM | POA: Diagnosis not present

## 2021-07-21 DIAGNOSIS — J90 Pleural effusion, not elsewhere classified: Secondary | ICD-10-CM | POA: Diagnosis not present

## 2021-07-21 DIAGNOSIS — J9601 Acute respiratory failure with hypoxia: Secondary | ICD-10-CM | POA: Diagnosis not present

## 2021-07-21 DIAGNOSIS — D509 Iron deficiency anemia, unspecified: Secondary | ICD-10-CM

## 2021-07-21 LAB — CBC WITH DIFFERENTIAL/PLATELET
Abs Immature Granulocytes: 0.02 10*3/uL (ref 0.00–0.07)
Basophils Absolute: 0 10*3/uL (ref 0.0–0.1)
Basophils Relative: 1 %
Eosinophils Absolute: 0.2 10*3/uL (ref 0.0–0.5)
Eosinophils Relative: 3 %
HCT: 26.4 % — ABNORMAL LOW (ref 36.0–46.0)
Hemoglobin: 8 g/dL — ABNORMAL LOW (ref 12.0–15.0)
Immature Granulocytes: 0 %
Lymphocytes Relative: 22 %
Lymphs Abs: 1.1 10*3/uL (ref 0.7–4.0)
MCH: 25.8 pg — ABNORMAL LOW (ref 26.0–34.0)
MCHC: 30.3 g/dL (ref 30.0–36.0)
MCV: 85.2 fL (ref 80.0–100.0)
Monocytes Absolute: 0.9 10*3/uL (ref 0.1–1.0)
Monocytes Relative: 18 %
Neutro Abs: 2.8 10*3/uL (ref 1.7–7.7)
Neutrophils Relative %: 56 %
Platelets: 132 10*3/uL — ABNORMAL LOW (ref 150–400)
RBC: 3.1 MIL/uL — ABNORMAL LOW (ref 3.87–5.11)
RDW: 25.2 % — ABNORMAL HIGH (ref 11.5–15.5)
Smear Review: NORMAL
WBC: 5.1 10*3/uL (ref 4.0–10.5)
nRBC: 0 % (ref 0.0–0.2)

## 2021-07-21 LAB — MAGNESIUM: Magnesium: 1.8 mg/dL (ref 1.7–2.4)

## 2021-07-21 LAB — BLOOD GAS, ARTERIAL
Acid-Base Excess: 10.8 mmol/L — ABNORMAL HIGH (ref 0.0–2.0)
Bicarbonate: 36.8 mmol/L — ABNORMAL HIGH (ref 20.0–28.0)
O2 Content: 3 L/min
O2 Saturation: 98.1 %
Patient temperature: 37
pCO2 arterial: 53 mmHg — ABNORMAL HIGH (ref 32–48)
pH, Arterial: 7.45 (ref 7.35–7.45)
pO2, Arterial: 80 mmHg — ABNORMAL LOW (ref 83–108)

## 2021-07-21 LAB — BASIC METABOLIC PANEL
Anion gap: 6 (ref 5–15)
BUN: 25 mg/dL — ABNORMAL HIGH (ref 8–23)
CO2: 35 mmol/L — ABNORMAL HIGH (ref 22–32)
Calcium: 8.6 mg/dL — ABNORMAL LOW (ref 8.9–10.3)
Chloride: 96 mmol/L — ABNORMAL LOW (ref 98–111)
Creatinine, Ser: 0.98 mg/dL (ref 0.44–1.00)
GFR, Estimated: 54 mL/min — ABNORMAL LOW (ref >60)
Glucose, Bld: 78 mg/dL (ref 70–99)
Potassium: 3.4 mmol/L — ABNORMAL LOW (ref 3.5–5.1)
Sodium: 137 mmol/L (ref 135–145)

## 2021-07-21 MED ORDER — METOPROLOL SUCCINATE ER 25 MG PO TB24
25.0000 mg | ORAL_TABLET | Freq: Once | ORAL | Status: AC
  Administered 2021-07-21: 25 mg via ORAL
  Filled 2021-07-21: qty 1

## 2021-07-21 MED ORDER — POTASSIUM CHLORIDE 20 MEQ PO PACK
40.0000 meq | PACK | Freq: Two times a day (BID) | ORAL | Status: AC
  Administered 2021-07-21 (×2): 40 meq via ORAL
  Filled 2021-07-21 (×2): qty 2

## 2021-07-21 MED ORDER — FUROSEMIDE 10 MG/ML IJ SOLN
20.0000 mg | Freq: Two times a day (BID) | INTRAMUSCULAR | Status: DC
  Administered 2021-07-21: 20 mg via INTRAVENOUS
  Filled 2021-07-21: qty 2

## 2021-07-21 MED ORDER — SODIUM CHLORIDE 0.9 % IV SOLN
400.0000 mg | Freq: Once | INTRAVENOUS | Status: AC
  Administered 2021-07-21: 400 mg via INTRAVENOUS
  Filled 2021-07-21: qty 20

## 2021-07-21 MED ORDER — METOPROLOL SUCCINATE ER 50 MG PO TB24
50.0000 mg | ORAL_TABLET | Freq: Every day | ORAL | Status: DC
  Administered 2021-07-22 – 2021-07-23 (×2): 50 mg via ORAL
  Filled 2021-07-21 (×2): qty 1

## 2021-07-21 NOTE — Progress Notes (Signed)
?Progress Note ? ? ?Patient: Allison Novak MHD:[id number] DOB: 02/17/1926 DOA: 07/17/2021     4 ?DOS: the patient was seen and examined on 07/21/2021 ?  ?Brief hospital course: ?[name] is a 88 y.o. female with medical history significant of dCHF, hypertension, hyperlipidemia, TIA, GERD, hypothyroidism, gout, atrial fibrillation and PE on Eliquis, CKD-3A, carotid artery stenosis, anemia, CAD, who presents with shortness breath.  BNP 484, chest x-ray showed moderate right pleural effusion with associated atelectasis.  Mild interstitial edema.  Patient was placed on IV Lasix.  Foley catheter was anchored on 3/12 for severe urinary tension. ? ?Assessment and Plan: ?* Acute on chronic diastolic CHF (congestive heart failure) ([place]) ?Echocardiogram showed ejection fraction 55 to 60% with moderate pulmonary hypertension. ?Patient unable to come off oxygen today.  We will switch Lasix to 20 mg IV twice daily.  Continue to monitor closely.  Increase Toprol-XL to 50 mg daily. ? ?Acute respiratory failure with hypoxia ([place]) ?Today pulse ox 86% on room air.  Patient back on oxygen.  Will diurese today and see if we can get off oxygen again tomorrow. ? ?Pleural effusion on right ?Patient states she is breathing better after 1.2 L of fluid taken off from underneath the right lung. ? ?Acute urinary retention ?Patient had Foley catheter placed for urinary retention.  I will discontinue oxybutynin and hopefully can take out Foley soon. ? ?Atrial fibrillation, chronic ([place]) ?Continue Eliquis for anticoagulation.  Increase Toprol-XL to 50 mg daily for better heart rate control. ? ?Chronic kidney disease, stage 3a ([place]) ?Current GFR 54.  Creatinine actually improved with diuresis. ? ?History of TIA (transient ischemic attack) ?Continue Eliquis for stroke prevention ? ?Sepsis ([place]) ?Ruled out ? ?Iron deficiency anemia ?I will give IV Venofer today.  Last hemoglobin 8.0. ? ?Thrombocytopenia ([place]) ?Mild thrombocytopenia.  Patient  is not on heparin.  Continue to follow. ? ?Hypothermia ?Temperature has improved.  Cortisol level 25.1, no adrenal insufficiency. ? ?History of pulmonary embolism ?Continue Eliquis. ? ?Normocytic anemia ?Hemoglobin still stable. ? ?Hyponatremia ?Mild hyponatremia, will follow. ? ?Hyperlipidemia ?Continue atorvastatin ? ? ? ? ?  ? ?Subjective: Patient feels better than when she came in.  Her breathing did get better after they drew off 1.2 L underneath the lung.  Still having some shortness of breath.  Unable to come off oxygen today. ? ?Physical Exam: ?Vitals:  ? 07/21/21 0500 07/21/21 0758 07/21/21 0850 07/21/21 0855  ?BP: 101/64 (!) 102/55    ?Pulse: 86 91    ?Resp: 13 17    ?Temp: 98.3 ?F (36.8 ?C) 97.9 ?F (36.6 ?C)    ?TempSrc: Oral Oral    ?SpO2: 95% 97% (!) 86% 93%  ?Weight: 61.7 kg     ?Height:      ? ?Physical Exam ?HENT:  ?   Head: Normocephalic.  ?   Mouth/Throat:  ?   Pharynx: No oropharyngeal exudate.  ?Eyes:  ?   General: Lids are normal.  ?   Conjunctiva/sclera: Conjunctivae normal.  ?Cardiovascular:  ?   Rate and Rhythm: Tachycardia present. Rhythm irregularly irregular.  ?   Heart sounds: Normal heart sounds, S1 normal and S2 normal.  ?Pulmonary:  ?   Breath sounds: Examination of the right-lower field reveals decreased breath sounds. Examination of the left-lower field reveals decreased breath sounds. Decreased breath sounds present. No wheezing, rhonchi or rales.  ?Abdominal:  ?   Palpations: Abdomen is soft.  ?   Tenderness: There is no abdominal tenderness.  ?Musculoskeletal:  ?  Right lower leg: Swelling present.  ?   Left lower leg: Swelling present.  ?Skin: ?   General: Skin is warm.  ?   Findings: No rash.  ?Neurological:  ?   Mental Status: She is alert and oriented to person, place, and time.  ?  ?Data Reviewed: ? ?Laboratory and radiological data reviewed. ?Lymphocyte predominance on cytology.  Continue to monitor. ? ?Family Communication: Spoke with family at the  bedside ? ?Disposition: ?Status is: Inpatient ?Remains inpatient appropriate because: Still requiring IV diuresis to see if we can get her off oxygen.  Heart rate also fast today in the 130s at times. ?Planned Discharge Destination: Rehab versus home with home health depending on progress ? ?Author: ?Loletha Grayer, MD ?07/21/2021 2:22 PM ? ?For on call review [email or web address].  ?

## 2021-07-21 NOTE — Progress Notes (Signed)
Inpatient Rehab Admissions Coordinator:  ? ?Late entry: note therapy recommending SNF.  Talked to CSW and family wishing to pursue SNF versus HH so pt can stay closer to family.  Will sign off for CIR at this time.  ? ?Shann Medal, PT, DPT ?Admissions Coordinator ?[phone] ?07/22/21  ?8:44 AM ? ?

## 2021-07-21 NOTE — TOC Progression Note (Signed)
Transition of Care (TOC) - Progression Note  ? ? ?Patient Details  ?Name: [place] T Dreese ?MRN: [id number] ?Date of Birth: [birth year] ? ?Transition of Care (TOC) CM/SW Contact  ?Alberteen Sam, LCSW ?Phone Number: ?07/21/2021, 10:56 AM ? ?Clinical Narrative:    ? ? ?CSW met with patient and 2 family members at bedside regarding dc planning, per PT patient was able to ambulate 200 ft today and they are recommending check -in's if they decide to go home.  ? ?Patient is active with [facility], CSW informed family that max services of 5 disciplines of PT, OT, RN, Cyndie Mull and social worker could be set up.  ? ?Family is deciding about SNF, they report they will discuss with other family members and let CSW know by end of day. CSW encouraged looking into home as patient is walking 200 ft.  ? ?Pending decision on dispo at this time.  ? ?  ?  ? ?Expected Discharge Plan and Services ?  ?  ?  ?  ?  ?                ?  ?  ?  ?  ?  ?  ?  ?  ?  ?  ? ? ?Social Determinants of Health (SDOH) Interventions ?  ? ?Readmission Risk Interventions ?No flowsheet data found. ? ?

## 2021-07-21 NOTE — Progress Notes (Signed)
Physical Therapy Treatment ?Patient Details ?Name: Allison Novak ?MRN: [id number] ?DOB: June 24, 1925 ?Today's Date: 07/21/2021 ? ? ?History of Present Illness 88 y/o F admitted 07/17/21 with SOB. Chest x-ray showed moderate right pleural effusion with associated atelectasis. Pt had thoracentsis on 07/19/21 which yielded 1200 of clear yellow fluid. PMH: dCHF, HTN, HLD, TIA, GERD, hypothyroidism, gout, a-fib & PE on eliquis, CKD-3A, carotid artery stenosis, anemia, CAD ? ?  ?PT Comments  ? ? Pt did her first longer bout of walk in many days and surprised herself/family with how much she was able to do.  She did need RW and O2 during the prolonged bout of ambulation with sats dropping from high 90s to low 90s on 2L, no LOBs but slower than baseline ambulation, minimal unsteadiness.  Of note HR was relatively elevated (and highly variable) t/o the session, in the 90s at rest, up to 130s, and generally stayin in the 100-110 range during ambulation. Pt showed good effort with mobility, protracted discussion with pt and daughter about help at home, ability to manage, set up, etc.  Still leaning toward STR, however with increased in-home assist (as well as likely needing new O2) she could conceivably return home with HHPT. ?   ?Recommendations for follow up therapy are one component of a multi-disciplinary discharge planning process, led by the attending physician.  Recommendations may be updated based on patient status, additional functional criteria and insurance authorization. ? ?Follow Up Recommendations ? Skilled nursing-short term rehab (<3 hours/day) ?  ?  ?Assistance Recommended at Discharge Frequent or constant Supervision/Assistance  ?Patient can return home with the following A little help with walking and/or transfers;A little help with bathing/dressing/bathroom;Assistance with cooking/housework;Direct supervision/assist for financial management;Assist for transportation;Help with stairs or ramp for entrance ?   ?Equipment Recommendations ? None recommended by PT  ?  ?Recommendations for Other Services   ? ? ?  ?Precautions / Restrictions Precautions ?Precautions: Fall ?Restrictions ?Weight Bearing Restrictions: No  ?  ? ?Mobility ? Bed Mobility ?Overal bed mobility: Needs Assistance ?Bed Mobility: Supine to Sit ?  ?  ?Supine to sit: Supervision ?  ?  ?General bed mobility comments: Pt with slow but unassisted transition to sitting EOB, light rail use ?  ? ?Transfers ?  ?  ?Transfers: Sit to/from Stand ?Sit to Stand: Min assist ?  ?  ?  ?  ?  ?General transfer comment: Pt attempted x2 to rise to standing w/o assist from standard height bed. Very light assist on 3rd attempt to keep weight forward and she was able to get to standing. ?  ? ?Ambulation/Gait ?Ambulation/Gait assistance: Min guard ?Gait Distance (Feet): 200 Feet ?Assistive device: Rolling walker (2 wheels) ?  ?  ?  ?  ?General Gait Details: Pt initially indicating that she did not feel as though she could do a prolonged bout of ambulation, however she moved well and showed good confidence and ultimately circumambulated the nurses' station relatively easily, minimal subjective fatigue.  Her HR did remain relatively high t/o the effort with 95 to 110s most of the time but briefly up to 130s on a few occasions; 2L O2 t/o the session with sats staying int he 90s. ? ? ?Stairs ?  ?  ?  ?  ?  ? ? ?Wheelchair Mobility ?  ? ?Modified Rankin (Stroke Patients Only) ?  ? ? ?  ?Balance Overall balance assessment: Needs assistance ?Sitting-balance support: Feet supported, Bilateral upper extremity supported ?Sitting balance-Leahy Scale: Good ?  ?  ?  Standing balance support: Bilateral upper extremity supported ?Standing balance-Leahy Scale: Fair ?Standing balance comment: Pt with minimal unsteadiness but no overt LOBs. ?  ?  ?  ?  ?  ?  ?  ?  ?  ?  ?  ?  ? ?  ?Cognition Arousal/Alertness: Awake/alert ?Behavior During Therapy: [facility] for tasks assessed/performed ?Overall Cognitive  Status: Within Functional Limits for tasks assessed ?  ?  ?  ?  ?  ?  ?  ?  ?  ?  ?  ?  ?  ?  ?  ?  ?  ?  ?  ? ?  ?Exercises   ? ?  ?General Comments General comments (skin integrity, edema, etc.): Per daughter they have tried weaning off O2 unsuccessfully multiple times - maintained 2L during ambulation.  SpO2 99% on 1L at rest, decreased sats to 93% on 2L during ambulation. ?  ?  ? ?Pertinent Vitals/Pain Pain Assessment ?Pain Assessment: No/denies pain  ? ? ?Home Living   ?  ?  ?  ?  ?  ?  ?  ?  ?  ?   ?  ?Prior Function    ?  ?  ?   ? ?PT Goals (current goals can now be found in the care plan section) Progress towards PT goals: Progressing toward goals ? ?  ?Frequency ? ? ? Min 2X/week ? ? ? ?  ?PT Plan Current plan remains appropriate  ? ? ?Co-evaluation   ?  ?  ?  ?  ? ?  ?AM-PAC PT "6 Clicks" Mobility   ?Outcome Measure ? Help needed turning from your back to your side while in a flat bed without using bedrails?: None ?Help needed moving from lying on your back to sitting on the side of a flat bed without using bedrails?: A Little ?Help needed moving to and from a bed to a chair (including a wheelchair)?: A Little ?Help needed standing up from a chair using your arms (e.g., wheelchair or bedside chair)?: A Little ?Help needed to walk in hospital room?: A Little ?Help needed climbing 3-5 steps with a railing? : A Lot ?6 Click Score: 18 ? ?  ?End of Session Equipment Utilized During Treatment: Oxygen ?Activity Tolerance: Patient tolerated treatment well ?Patient left: with chair alarm set;with call bell/phone within reach;with family/visitor present ?Nurse Communication: Mobility status ?PT Visit Diagnosis: Muscle weakness (generalized) (M62.81);Difficulty in walking, not elsewhere classified (R26.2);Unsteadiness on feet (R26.81) ?  ? ? ?Time: 0925-1010 ?PT Time Calculation (min) (ACUTE ONLY): 45 min ? ?Charges:  $Gait Training: 8-22 mins ?$Therapeutic Exercise: 8-22 mins ?$Therapeutic Activity: 8-22 mins           ?          ?Kreg Shropshire, DPT ?07/21/2021, 1:11 PM ? ?

## 2021-07-21 NOTE — Assessment & Plan Note (Addendum)
Received IV Venofer 07/21/2021.  Last hemoglobin 8.7. ?

## 2021-07-21 NOTE — Assessment & Plan Note (Signed)
Continue atorvastatin

## 2021-07-21 NOTE — Progress Notes (Addendum)
Occupational Therapy Treatment ?Patient Details ?Name: Allison Novak ?MRN: [id number] ?DOB: 28-Jan-1926 ?Today's Date: 07/21/2021 ? ? ?History of present illness 88 y/o F admitted 07/17/21 with SOB. Chest x-ray showed moderate right pleural effusion with associated atelectasis. Pt had thoracentsis on 07/19/21 which yielded 1200 of clear yellow fluid. PMH: dCHF, HTN, HLD, TIA, GERD, hypothyroidism, gout, a-fib & PE on eliquis, CKD-3A, carotid artery stenosis, anemia, CAD ?  ?OT comments ? Chart reviewed to date, nurse cleared pt for participation in OT tx session. Pt son and daughter in law in room throughout session. Tx session targeted progressing functional mobility and indep ADL task completion. Pt tolerates standing at sink for approx 10 minutes completing grooming tasks, performs STS 5x 2 sets with CGA-MIN A from bedside chair. Progress is noted in functional mobility and indep ADL task completion. Education provided to pt and family re: discharge recommendations STR vs HH. At this time pt could go home with [facility] with frequent supervision. Pt is left as received, NAD, all needs met. Nurse present. OT will continue to follow acutely.  ? ?BP at start of session 101/57, end of session 99/64. No c/o dizziness. HR up to 110 with activity.  ? ?Recommendations for follow up therapy are one component of a multi-disciplinary discharge planning process, led by the attending physician.  Recommendations may be updated based on patient status, additional functional criteria and insurance authorization. ?   ?Follow Up Recommendations ? Skilled nursing-short term rehab (<3 hours/day)  ?  ?Assistance Recommended at Discharge Frequent or constant Supervision/Assistance  ?Patient can return home with the following ? A lot of help with walking and/or transfers;A lot of help with bathing/dressing/bathroom;Assistance with cooking/housework;Help with stairs or ramp for entrance;Assist for transportation ?  ?Equipment Recommendations ?  BSC/3in1;Tub/shower seat  ?  ?Recommendations for Other Services   ? ?  ?Precautions / Restrictions Precautions ?Precautions: Fall ?Restrictions ?Weight Bearing Restrictions: No  ? ? ?  ? ?Mobility Bed Mobility ?  ?  ?  ?  ?  ?  ?  ?General bed mobility comments: received in chair ?  ? ?Transfers ?Overall transfer level: Needs assistance ?Equipment used: Rolling walker (2 wheels) ?Transfers: Sit to/from Stand ?Sit to Stand: Min guard, Min assist ?  ?  ?  ?  ?  ?General transfer comment: STS 5x 2 sets from bedside chair with CGA-MIN A, frequent vcs to reach backwards before sitting posteriorly ?  ?  ?Balance Overall balance assessment: Needs assistance ?Sitting-balance support: Feet supported, Bilateral upper extremity supported ?Sitting balance-Leahy Scale: Good ?  ?  ?Standing balance support: Bilateral upper extremity supported, During functional activity ?Standing balance-Leahy Scale: Fair ?  ?  ?  ?  ?  ?  ?  ?  ?  ?  ?  ?  ?   ? ?ADL either performed or assessed with clinical judgement  ? ?ADL Overall ADL's : Needs assistance/impaired ?Eating/Feeding: Set up ?  ?Grooming: Wash/dry hands;Wash/dry face;Oral care;Supervision/safety;Standing ?Grooming Details (indicate cue type and reason): sink level ?  ?  ?  ?  ?  ?  ?Lower Body Dressing: Moderate assistance ?Lower Body Dressing Details (indicate cue type and reason): shoes ?Toilet Transfer: Ambulation;Min guard;Rolling walker (2 wheels) ?Toilet Transfer Details (indicate cue type and reason): simulated to bedside chair ?  ?  ?  ?  ?Functional mobility during ADLs: Supervision/safety;Min guard;Rolling walker (2 wheels) ?  ?  ? ?Extremity/Trunk Assessment   ?  ?  ?  ?  ?  ? ?  Vision   ?  ?  ?Perception   ?  ?Praxis   ?  ? ?Cognition Arousal/Alertness: Awake/alert ?Behavior During Therapy: [facility] for tasks assessed/performed ?Overall Cognitive Status: Within Functional Limits for tasks assessed ?  ?  ?  ?  ?  ?  ?  ?  ?  ?  ?  ?  ?  ?  ?  ?  ?  ?  ?  ?   ?Exercises  Other Exercises ?Other Exercises: education re: role of OT, role of rehab, discharge recommendations ? ?  ?Shoulder Instructions   ? ? ?  ?General Comments 1L o2 via [place] during activity with spo2 >90% throughout.  ? ? ?Pertinent Vitals/ Pain       Pain Assessment ?Pain Assessment: No/denies pain ? ?Home Living   ?  ?  ?  ?  ?  ?  ?  ?  ?  ?  ?  ?  ?  ?  ?  ?  ?  ?  ? ?  ?Prior Functioning/Environment    ?  ?  ?  ?   ? ?Frequency ? Min 2X/week  ? ? ? ? ?  ?Progress Toward Goals ? ?OT Goals(current goals can now be found in the care plan section) ? Progress towards OT goals: Progressing toward goals ? ?Acute Rehab OT Goals ?Patient Stated Goal: get stronger ?OT Goal Formulation: With patient ?Time For Goal Achievement: 08/04/21 ?Potential to Achieve Goals: Good  ?Plan     ? ?Co-evaluation ? ? ?   ?  ?  ?  ?  ? ?  ?AM-PAC OT "6 Clicks" Daily Activity     ?Outcome Measure ? ? Help from another person eating meals?: None ?Help from another person taking care of personal grooming?: A Little ?Help from another person toileting, which includes using toliet, bedpan, or urinal?: A Lot ?Help from another person bathing (including washing, rinsing, drying)?: A Lot ?Help from another person to put on and taking off regular upper body clothing?: A Little ?Help from another person to put on and taking off regular lower body clothing?: A Lot ?6 Click Score: 16 ? ?  ?End of Session Equipment Utilized During Treatment: Rolling walker (2 wheels) ? ?OT Visit Diagnosis: Unsteadiness on feet (R26.81);Muscle weakness (generalized) (M62.81);History of falling (Z91.81) ?  ?Activity Tolerance Patient tolerated treatment well ?  ?Patient Left with call bell/phone within reach;with family/visitor present;with nursing/sitter in room;in chair;with chair alarm set ?  ?Nurse Communication Mobility status ?  ? ?   ? ?Time: 6759-1638 ?OT Time Calculation (min): 38 min ? ?Charges: OT General Charges ?$OT Visit: 1 Visit ?OT Treatments ?$Self Care/Home  Management : 23-37 mins ?$Therapeutic Activity: 8-22 mins ? ?Shanon Payor, OTD OTR/L  ?07/21/21, 2:49 PM  ?

## 2021-07-21 NOTE — [place] FL2 (Signed)
?[place] MEDICAID FL2 LEVEL OF CARE SCREENING TOOL  ?  ? ?IDENTIFICATION  ?Patient Name: ?Allison Novak Birthdate: 1925-08-13 Sex: female Admission Date (Current Location): ?07/17/2021  ?[place] and [place] Number: ? [place] ?  Facility and Address:  ?[facility], [address], [place], [place] [address] ?     Provider Number: ?[id number]  ?Attending Physician Name and Address:  ?Loletha Grayer, MD ? Relative Name and Phone Number:  ?Hoyle Sauer (Daughter) [phone] ?   ?Current Level of Care: ?Hospital Recommended Level of Care: ?[place] Prior Approval Number: ?  ? ?Date Approved/Denied: ?  PASRR Number: ?[id number] A ? ?Discharge Plan: ?SNF ?  ? ?Current Diagnoses: ?Patient Active Problem List  ? Diagnosis Date Noted  ? Iron deficiency anemia 07/21/2021  ? Moderate pulmonary hypertension ([place]) 07/20/2021  ? Sepsis ([place]) 07/19/2021  ? Thrombocytopenia ([place]) 07/19/2021  ? Acute urinary retention 07/18/2021  ? History of pulmonary embolism 07/18/2021  ? Hypothermia 07/18/2021  ? Pleural effusion on right 07/18/2021  ? Acute on chronic diastolic CHF (congestive heart failure) ([place]) 07/17/2021  ? HLD (hyperlipidemia) 07/17/2021  ? Pulmonary embolism ([place]) 07/17/2021  ? Chronic kidney disease, stage 3a ([place]) 07/17/2021  ? Normocytic anemia 07/17/2021  ? Hypothyroidism 07/17/2021  ? Acute respiratory failure with hypoxia ([place]) 07/17/2021  ? Anemia of chronic disease 06/07/2021  ? Permanent atrial fibrillation ([place]) 06/07/2021  ? Hyponatremia 06/07/2021  ? Acute on chronic combined systolic and diastolic CHF (congestive heart failure) ([place]) 06/07/2021  ? Atrial fibrillation, chronic ([place]) 08/04/2020  ? Acute exacerbation of CHF (congestive heart failure) ([place]) 08/04/2020  ? Shortness of breath 08/03/2020  ? Hypokalemia   ? Palpitations   ? Hyperlipidemia 02/16/2020  ? Atrial fibrillation with RVR ([place]) 02/16/2020  ? Hypoxia 02/16/2020  ? Acute CHF (congestive heart failure)  ([place]) 02/16/2020  ? Chronic anticoagulation 02/16/2020  ? History of TIA (transient ischemic attack) 02/23/2017  ? Carotid artery disease ([place]) 02/23/2017  ? TIA (transient ischemic attack) 02/18/2017  ? GERD (gastroesophageal reflux disease) 02/18/2017  ? Chest pain 06/23/2015  ? Essential hypertension 06/16/2015  ? Aortic atherosclerosis ([place]) 06/16/2015  ? ? ?Orientation RESPIRATION BLADDER Height & Weight   ?  ?Self, Time, Situation, Place ? O2 (1L nasal cannula) Incontinent, External catheter Weight: 136 lb 0.4 oz (61.7 kg) ?Height:  '5\' 2"'$  (157.5 cm)  ?BEHAVIORAL SYMPTOMS/MOOD NEUROLOGICAL BOWEL NUTRITION STATUS  ?    Continent Diet (see discharge summary)  ?AMBULATORY STATUS COMMUNICATION OF NEEDS Skin   ?Limited Assist Verbally Normal ?  ?  ?  ?    ?     ?     ? ? ?Personal Care Assistance Level of Assistance  ?Bathing, Feeding, Dressing, Total care Bathing Assistance: Independent ?Feeding assistance: Independent ?Dressing Assistance: Limited assistance ?Total Care Assistance: Limited assistance  ? ?Functional Limitations Info  ?Sight, Hearing, Speech Sight Info: Adequate ?Hearing Info: Adequate ?Speech Info: Adequate  ? ? ?SPECIAL CARE FACTORS FREQUENCY  ?PT (By licensed PT), OT (By licensed OT)   ?  ?PT Frequency: min 4x weekly ?OT Frequency: min 4x weekly ?  ?  ?  ?   ? ? ?Contractures Contractures Info: Not present  ? ? ?Additional Factors Info  ?Code Status, Allergies Code Status Info: DNR ?Allergies Info: keflex, macrodantin ?  ?  ?  ?   ? ?Current Medications (07/21/2021):  This is the current hospital active medication list ?Current Facility-Administered Medications  ?Medication Dose Route Frequency Provider Last Rate Last Admin  ?  acetaminophen (TYLENOL) tablet 650 mg  650 mg Oral Q6H PRN Ivor Costa, MD   650 mg at 07/18/21 1656  ? albuterol (PROVENTIL) (2.5 MG/3ML) 0.083% nebulizer solution 3 mL  3 mL Nebulization Q4H PRN Ivor Costa, MD      ? allopurinol (ZYLOPRIM) tablet 100 mg  100 mg Oral Daily  Ivor Costa, MD   100 mg at 07/21/21 0851  ? apixaban (ELIQUIS) tablet 5 mg  5 mg Oral BID Wynelle Cleveland, RPH   5 mg at 07/21/21 4975  ? atorvastatin (LIPITOR) tablet 40 mg  40 mg Oral QPM Ivor Costa, MD   40 mg at 07/20/21 1807  ? calcium carbonate (OS-CAL - dosed in mg of elemental calcium) tablet 500 mg of elemental calcium  1 tablet Oral QHS Ivor Costa, MD   500 mg of elemental calcium at 07/20/21 2152  ? Chlorhexidine Gluconate Cloth 2 % PADS 6 each  6 each Topical Daily Sharen Hones, MD   6 each at 07/21/21 [phone]  ? cholecalciferol (VITAMIN D) tablet 2,000 Units  2,000 Units Oral Daily Ivor Costa, MD   2,000 Units at 07/21/21 [phone]  ? dextromethorphan-guaiFENesin (MUCINEX DM) 30-600 MG per 12 hr tablet 1 tablet  1 tablet Oral BID PRN Ivor Costa, MD      ? furosemide (LASIX) injection 20 mg  20 mg Intravenous BID Loletha Grayer, MD      ? iron sucrose (VENOFER) 400 mg in sodium chloride 0.9 % 250 mL IVPB  400 mg Intravenous Once Loletha Grayer, MD 108 mL/hr at 07/21/21 1436 400 mg at 07/21/21 1436  ? levothyroxine (SYNTHROID) tablet 25 mcg  25 mcg Oral Daily Ivor Costa, MD   25 mcg at 07/21/21 1117  ? MEDLINE mouth rinse  15 mL Mouth Rinse BID Ivor Costa, MD   15 mL at 07/21/21 3567  ? [START ON 07/22/2021] metoprolol succinate (TOPROL-XL) 24 hr tablet 50 mg  50 mg Oral Daily Wieting, Richard, MD      ? ondansetron [facility]) injection 4 mg  4 mg Intravenous Q8H PRN Ivor Costa, MD      ? pantoprazole (PROTONIX) EC tablet 40 mg  40 mg Oral Daily Ivor Costa, MD   40 mg at 07/21/21 0850  ? potassium chloride (KLOR-CON) packet 40 mEq  40 mEq Oral BID Loletha Grayer, MD   40 mEq at 07/21/21 1435  ? tamsulosin (FLOMAX) capsule 0.4 mg  0.4 mg Oral Daily Sharen Hones, MD   0.4 mg at 07/21/21 0141  ? ? ? ?Discharge Medications: ?Please see discharge summary for a list of discharge medications. ? ?Relevant Imaging Results: ? ?Relevant Lab Results: ? ? ?Additional Information ?CVU:[id number] ? ?Alberteen Sam,  LCSW ? ? ? ? ?

## 2021-07-22 ENCOUNTER — Inpatient Hospital Stay: Payer: Medicare Other

## 2021-07-22 DIAGNOSIS — E871 Hypo-osmolality and hyponatremia: Secondary | ICD-10-CM

## 2021-07-22 DIAGNOSIS — J9601 Acute respiratory failure with hypoxia: Secondary | ICD-10-CM | POA: Diagnosis not present

## 2021-07-22 DIAGNOSIS — J90 Pleural effusion, not elsewhere classified: Secondary | ICD-10-CM | POA: Diagnosis not present

## 2021-07-22 DIAGNOSIS — I5033 Acute on chronic diastolic (congestive) heart failure: Secondary | ICD-10-CM | POA: Diagnosis not present

## 2021-07-22 DIAGNOSIS — R338 Other retention of urine: Secondary | ICD-10-CM | POA: Diagnosis not present

## 2021-07-22 LAB — BODY FLUID CULTURE W GRAM STAIN: Culture: NO GROWTH

## 2021-07-22 LAB — BASIC METABOLIC PANEL
Anion gap: 5 (ref 5–15)
BUN: 26 mg/dL — ABNORMAL HIGH (ref 8–23)
CO2: 35 mmol/L — ABNORMAL HIGH (ref 22–32)
Calcium: 8.4 mg/dL — ABNORMAL LOW (ref 8.9–10.3)
Chloride: 95 mmol/L — ABNORMAL LOW (ref 98–111)
Creatinine, Ser: 1.15 mg/dL — ABNORMAL HIGH (ref 0.44–1.00)
GFR, Estimated: 44 mL/min — ABNORMAL LOW (ref >60)
Glucose, Bld: 81 mg/dL (ref 70–99)
Potassium: 4 mmol/L (ref 3.5–5.1)
Sodium: 135 mmol/L (ref 135–145)

## 2021-07-22 MED ORDER — METOPROLOL SUCCINATE ER 25 MG PO TB24
25.0000 mg | ORAL_TABLET | Freq: Every day | ORAL | Status: DC
  Administered 2021-07-22: 25 mg via ORAL
  Filled 2021-07-22: qty 1

## 2021-07-22 MED ORDER — POLYETHYLENE GLYCOL 3350 17 G PO PACK: 17.0000 g | PACK | Freq: Every day | ORAL | Status: DC | PRN

## 2021-07-22 MED ORDER — TORSEMIDE 20 MG PO TABS
20.0000 mg | ORAL_TABLET | Freq: Every day | ORAL | Status: DC
  Administered 2021-07-22: 20 mg via ORAL
  Filled 2021-07-22: qty 1

## 2021-07-22 MED ORDER — ALUM & MAG HYDROXIDE-SIMETH 200-200-20 MG/5ML PO SUSP
30.0000 mL | ORAL | Status: DC | PRN
  Administered 2021-07-22: 30 mL via ORAL
  Filled 2021-07-22: qty 30

## 2021-07-22 NOTE — Progress Notes (Signed)
Occupational Therapy Treatment ?Patient Details ?Name: Allison Novak ?MRN: [id number] ?DOB: 1925-05-29 ?Today's Date: 07/22/2021 ? ? ?History of present illness 88 y/o F admitted 07/17/21 with SOB. Chest x-ray showed moderate right pleural effusion with associated atelectasis. Pt had thoracentsis on 07/19/21 which yielded 1200 of clear yellow fluid. PMH: dCHF, HTN, HLD, TIA, GERD, hypothyroidism, gout, a-fib & PE on eliquis, CKD-3A, carotid artery stenosis, anemia, CAD ?  ?OT comments ? Chart reviewed to date, pt greeted in bed agreeable to OT tx session. Tx session targeted progressing functional mobility and ADL task completion. Pt is making progress towards goals, continued improved performance in ADL task completion and functional mobility with pt completing grooming tasks standing at sink level with close sup. Pt is left in bedside chair, NAD, all needs met. OT will continue to follow acutely.   ? ?Recommendations for follow up therapy are one component of a multi-disciplinary discharge planning process, led by the attending physician.  Recommendations may be updated based on patient status, additional functional criteria and insurance authorization. ?   ?Follow Up Recommendations ? Skilled nursing-short term rehab (<3 hours/day)  ?  ?Assistance Recommended at Discharge Frequent or constant Supervision/Assistance  ?Patient can return home with the following ? A lot of help with walking and/or transfers;A lot of help with bathing/dressing/bathroom;Assistance with cooking/housework;Help with stairs or ramp for entrance;Assist for transportation ?  ?Equipment Recommendations ?    ?  ?Recommendations for Other Services   ? ?  ?Precautions / Restrictions Precautions ?Precautions: Fall ?Precaution Comments: foley catheter ?Restrictions ?Weight Bearing Restrictions: No  ? ? ?  ? ?Mobility Bed Mobility ?Overal bed mobility: Needs Assistance ?Bed Mobility: Supine to Sit ?  ?  ?Supine to sit: Supervision, HOB elevated ?  ?   ?  ?  ? ?Transfers ?Overall transfer level: Needs assistance ?Equipment used: Rolling walker (2 wheels) ?Transfers: Sit to/from Stand ?Sit to Stand: Supervision ?  ?  ?  ?  ?  ?  ?  ?  ?Balance Overall balance assessment: Needs assistance ?Sitting-balance support: No upper extremity supported, Feet supported ?Sitting balance-Leahy Scale: Good ?  ?  ?Standing balance support: Bilateral upper extremity supported, During functional activity ?Standing balance-Leahy Scale: Good ?  ?  ?  ?  ?  ?  ?  ?  ?  ?  ?  ?  ?   ? ?ADL either performed or assessed with clinical judgement  ? ?ADL Overall ADL's : Needs assistance/impaired ?  ?  ?  ?  ?  ?  ?  ?  ?  ?  ?  ?  ?  ?  ?  ?  ?  ?  ?  ?General ADL Comments: STS with CGA, grooming at sink level with RW with close sup for approx 10 minutes ?  ? ?Extremity/Trunk Assessment   ?  ?  ?  ?  ?  ? ?Vision   ?  ?  ?Perception   ?  ?Praxis   ?  ? ?Cognition Arousal/Alertness: Awake/alert ?Behavior During Therapy: [facility] for tasks assessed/performed ?Overall Cognitive Status: Within Functional Limits for tasks assessed ?  ?  ?  ?  ?  ?  ?  ?  ?  ?  ?  ?  ?  ?  ?  ?  ?  ?  ?  ?   ?Exercises   ? ?  ?Shoulder Instructions   ? ? ?  ?General Comments 1L o2 via [place] spo2 >90%  ? ? ?  Pertinent Vitals/ Pain       Pain Assessment ?Pain Assessment: No/denies pain ? ?Home Living   ?  ?  ?  ?  ?  ?  ?  ?  ?  ?  ?  ?  ?  ?  ?  ?  ?  ?  ? ?  ?Prior Functioning/Environment    ?  ?  ?  ?   ? ?Frequency ? Min 2X/week  ? ? ? ? ?  ?Progress Toward Goals ? ?OT Goals(current goals can now be found in the care plan section) ? Progress towards OT goals: Progressing toward goals ? ?Acute Rehab OT Goals ?Patient Stated Goal: get stronger ?OT Goal Formulation: With patient ?Time For Goal Achievement: 08/05/21 ?Potential to Achieve Goals: Good  ?Plan Discharge plan remains appropriate   ? ?Co-evaluation ? ? ?   ?  ?  ?  ?  ? ?  ?AM-PAC OT "6 Clicks" Daily Activity     ?Outcome Measure ? ? Help from another person  eating meals?: None ?Help from another person taking care of personal grooming?: None ?Help from another person toileting, which includes using toliet, bedpan, or urinal?: A Little ?Help from another person bathing (including washing, rinsing, drying)?: A Little ?Help from another person to put on and taking off regular upper body clothing?: A Little ?Help from another person to put on and taking off regular lower body clothing?: A Lot ?6 Click Score: 19 ? ?  ?End of Session Equipment Utilized During Treatment: Rolling walker (2 wheels) ? ?OT Visit Diagnosis: Unsteadiness on feet (R26.81);Muscle weakness (generalized) (M62.81);History of falling (Z91.81) ?  ?Activity Tolerance Patient tolerated treatment well ?  ?Patient Left with call bell/phone within reach;with family/visitor present;with nursing/sitter in room;in chair;with chair alarm set ?  ?Nurse Communication Mobility status ?  ? ?   ? ?Time: 2563-8937 ?OT Time Calculation (min): 34 min ? ?Charges: OT General Charges ?$OT Visit: 1 Visit ?OT Treatments ?$Self Care/Home Management : 8-22 mins ?$Therapeutic Activity: 8-22 mins ? ?Shanon Payor, OTD OTR/L  ?07/22/21, 4:21 PM  ?

## 2021-07-22 NOTE — Progress Notes (Signed)
Physical Therapy Treatment ?Patient Details ?Name: Allison Novak ?MRN: [id number] ?DOB: 01/16/1926 ?Today's Date: 07/22/2021 ? ? ?History of Present Illness 87 y/o F admitted 07/17/21 with SOB. Chest x-ray showed moderate right pleural effusion with associated atelectasis. Pt had thoracentsis on 07/19/21 which yielded 1200 of clear yellow fluid. PMH: dCHF, HTN, HLD, TIA, GERD, hypothyroidism, gout, a-fib & PE on eliquis, CKD-3A, carotid artery stenosis, anemia, CAD ? ?  ?PT Comments  ? ? Pt resting in recliner upon PT arrival; agreeable to PT session; required assist to donn shoes for ambulation (use of shoe horn for L shoe d/t h/o chronic L foot pain and h/o surgeries).  Sit to stand CGA with mild increased effort to stand from recliner up to walker; CGA to ambulate 200 feet with RW; and CGA stand to sit (pt requiring cueing to line up to chair prior to sitting--pt appeared fatigued post ambulation and trying to sit too early).  Per discussion with pt's nurse, trialed pt on room air for ambulation.  Initially pt's O2 sats 90% on room air first 40 feet; pt then ambulated another 80 feet but pt appearing more quiet and SOB noted--attempted to take pt's O2 sats but unable to get a reading so pt placed on 1 L O2 for ambulation back to room; pt's O2 sats 91% on 1 L O2 via nasal cannula shortly after sitting in recliner (post ambulation) and O2 sats increased to 93% at rest on 1 L O2 via nasal cannula end of session.  HR around 96 bpm at rest and increased up to 117 bpm with activity during sessions activities.  Will continue to focus on strengthening, balance, endurance, and progressive functional mobility during hospitalization. ?   ?Recommendations for follow up therapy are one component of a multi-disciplinary discharge planning process, led by the attending physician.  Recommendations may be updated based on patient status, additional functional criteria and insurance authorization. ? ?Follow Up Recommendations ?  Skilled nursing-short term rehab (<3 hours/day) ?  ?  ?Assistance Recommended at Discharge Frequent or constant Supervision/Assistance  ?Patient can return home with the following A little help with walking and/or transfers;A little help with bathing/dressing/bathroom;Assistance with cooking/housework;Direct supervision/assist for financial management;Assist for transportation;Help with stairs or ramp for entrance ?  ?Equipment Recommendations ? Rolling walker (2 wheels)  ?  ?Recommendations for Other Services   ? ? ?  ?Precautions / Restrictions Precautions ?Precautions: Fall ?Precaution Comments: foley catheter ?Restrictions ?Weight Bearing Restrictions: No  ?  ? ?Mobility ? Bed Mobility ?  ?  ?  ?  ?  ?  ?  ?General bed mobility comments: Deferred (pt in recliner beginning/end of session) ?  ? ?Transfers ?Overall transfer level: Needs assistance ?Equipment used: Rolling walker (2 wheels) ?Transfers: Sit to/from Stand ?Sit to Stand: Min guard ?  ?  ?  ?  ?  ?General transfer comment: mild increased effort to stand from recliner on own; vc's to line up to chair prior to sitting (pt fatigued and trying to sit too early) ?  ? ?Ambulation/Gait ?Ambulation/Gait assistance: Min guard ?Gait Distance (Feet): 200 Feet ?Assistive device: Rolling walker (2 wheels) ?  ?Gait velocity: decreased ?  ?  ?General Gait Details: partial step through gait pattern ? ? ?Stairs ?  ?  ?  ?  ?  ? ? ?Wheelchair Mobility ?  ? ?Modified Rankin (Stroke Patients Only) ?  ? ? ?  ?Balance Overall balance assessment: Needs assistance ?Sitting-balance support: No upper extremity supported, Feet supported ?  Sitting balance-Leahy Scale: Good ?Sitting balance - Comments: steady sitting reaching within BOS ?  ?Standing balance support: Bilateral upper extremity supported, During functional activity ?Standing balance-Leahy Scale: Good ?Standing balance comment: no loss of balance ambulating with RW ?  ?  ?  ?  ?  ?  ?  ?  ?  ?  ?  ?  ? ?  ?Cognition  Arousal/Alertness: Awake/alert ?Behavior During Therapy: [facility] for tasks assessed/performed ?Overall Cognitive Status: Within Functional Limits for tasks assessed ?  ?  ?  ?  ?  ?  ?  ?  ?  ?  ?  ?  ?  ?  ?  ?  ?  ?  ?  ? ?  ?Exercises   ? ?  ?General Comments  Nursing cleared pt for participation in physical therapy.  Pt agreeable to PT session.  Pt's family member present during session. ?  ?  ? ?Pertinent Vitals/Pain Pain Assessment ?Pain Assessment: No/denies pain  ? ? ?Home Living   ?  ?  ?  ?  ?  ?  ?  ?  ?  ?   ?  ?Prior Function    ?  ?  ?   ? ?PT Goals (current goals can now be found in the care plan section) Acute Rehab PT Goals ?Patient Stated Goal: get better ?PT Goal Formulation: With patient ?Time For Goal Achievement: 08/02/21 ?Potential to Achieve Goals: Good ?Progress towards PT goals: Progressing toward goals ? ?  ?Frequency ? ? ? Min 2X/week ? ? ? ?  ?PT Plan Current plan remains appropriate  ? ? ?Co-evaluation   ?  ?  ?  ?  ? ?  ?AM-PAC PT "6 Clicks" Mobility   ?Outcome Measure ? Help needed turning from your back to your side while in a flat bed without using bedrails?: None ?Help needed moving from lying on your back to sitting on the side of a flat bed without using bedrails?: A Little ?Help needed moving to and from a bed to a chair (including a wheelchair)?: A Little ?Help needed standing up from a chair using your arms (e.g., wheelchair or bedside chair)?: A Little ?Help needed to walk in hospital room?: A Little ?Help needed climbing 3-5 steps with a railing? : A Lot ?6 Click Score: 18 ? ?  ?End of Session Equipment Utilized During Treatment: Oxygen;Gait belt ?Activity Tolerance: Patient limited by fatigue ?Patient left: in chair;with call bell/phone within reach;with chair alarm set;with family/visitor present ?Nurse Communication: Mobility status;Precautions ?PT Visit Diagnosis: Muscle weakness (generalized) (M62.81);Difficulty in walking, not elsewhere classified (R26.2);Unsteadiness on  feet (R26.81) ?  ? ? ?Time: 3086-5784 ?PT Time Calculation (min) (ACUTE ONLY): 25 min ? ?Charges:  $Gait Training: 8-22 mins ?$Therapeutic Activity: 8-22 mins          ?          ?Leitha Bleak, PT ?07/22/21, 4:22 PM ? ? ?

## 2021-07-22 NOTE — Progress Notes (Signed)
?Progress Note ? ? ?Patient: Allison Novak PYP:[id number] DOB: 16-Jan-1926 DOA: 07/17/2021     5 ?DOS: the patient was seen and examined on 07/22/2021 ?  ?Brief hospital course: ?[name] is a 88 y.o. female with medical history significant of dCHF, hypertension, hyperlipidemia, TIA, GERD, hypothyroidism, gout, atrial fibrillation and PE on Eliquis, CKD-3A, carotid artery stenosis, anemia, CAD, who presents with shortness breath.  BNP 484, chest x-ray showed moderate right pleural effusion with associated atelectasis.  Mild interstitial edema.  Patient was placed on IV Lasix.  Foley catheter was anchored on 3/12 for severe urinary tension.  Thoracentesis on 07/19/2021 removed 1200 mL of clear fluid. ? ?Assessment and Plan: ?* Acute on chronic diastolic CHF (congestive heart failure) ([place]) ?Echocardiogram showed ejection fraction 55 to 60% with moderate pulmonary hypertension.  With creatinine rising I held Lasix today.  Still trying to get off oxygen on 1 L at this point. ? ? ?Acute respiratory failure with hypoxia ([place]) ?Yesterday pulse ox 86% on room air.  Today with pulse ox of 90% on 1 L.  Check pulse ox on room air with ambulation. ? ?Pleural effusion on right ?Patient states she is breathing better after 1.2 L of fluid taken off from underneath the right lung on 07/19/2021.  We will repeat chest x-ray today to see if fluid has reaccumulated.  Ordered incentive spirometer. ? ?Acute urinary retention ?Patient had Foley catheter placed for urinary retention.  I will discontinue oxybutynin and hopefully can take out Foley soon. ? ?Atrial fibrillation, chronic ([place]) ?Continue Eliquis for anticoagulation.  Increase Toprol-XL to 50 mg daily and added 25 mg Toprol-XL at night. ? ?Chronic kidney disease, stage 3a ([place]) ?Creatinine worsened to 1.15 today.  Hold diuresis today and reassess tomorrow.  GFR today 44. ? ?History of TIA (transient ischemic attack) ?Continue Eliquis for stroke prevention ? ?Sepsis  ([place]) ?Ruled out ? ?Iron deficiency anemia ?Received IV Venofer 07/21/2021.  Last hemoglobin 8.0. ? ?Thrombocytopenia ([place]) ?Mild thrombocytopenia.  Patient is not on heparin.  Continue to follow. ? ?Hypothermia ?Temperature has improved.  Cortisol level 25.1, no adrenal insufficiency. ? ?History of pulmonary embolism ?Continue Eliquis. ? ?Hyponatremia ?Last sodium 135. ? ?Hyperlipidemia ?Continue atorvastatin ? ? ? ? ?  ? ?Subjective: Patient still feels a little weak.  Encouraged her to take a deep breath.  Some constipation but think she will be able to go to the bathroom today.  Breathing better since removal of 1.2 L underneath the right lung. ? ?Physical Exam: ?Vitals:  ? 07/22/21 0724 07/22/21 1056 07/22/21 1118 07/22/21 1141  ?BP: 115/60   (!) 106/52  ?Pulse: 69   92  ?Resp: 18   18  ?Temp: 98 ?F (36.7 ?C)   97.9 ?F (36.6 ?C)  ?TempSrc: Oral   Oral  ?SpO2: 93%   90%  ?Weight:  60.8 kg 61.9 kg   ?Height:      ? ?Physical Exam ?HENT:  ?   Head: Normocephalic.  ?   Mouth/Throat:  ?   Pharynx: No oropharyngeal exudate.  ?Eyes:  ?   General: Lids are normal.  ?   Conjunctiva/sclera: Conjunctivae normal.  ?Cardiovascular:  ?   Rate and Rhythm: Tachycardia present. Rhythm irregularly irregular.  ?   Heart sounds: Normal heart sounds, S1 normal and S2 normal.  ?Pulmonary:  ?   Breath sounds: Examination of the right-lower field reveals decreased breath sounds. Examination of the left-lower field reveals decreased breath sounds. Decreased breath sounds present. No wheezing,  rhonchi or rales.  ?Abdominal:  ?   Palpations: Abdomen is soft.  ?   Tenderness: There is no abdominal tenderness.  ?Musculoskeletal:  ?   Right lower leg: Swelling present.  ?   Left lower leg: Swelling present.  ?Skin: ?   General: Skin is warm.  ?   Findings: No rash.  ?Neurological:  ?   Mental Status: She is alert and oriented to person, place, and time.  ?  ?Data Reviewed: ?Creatinine 1.15 today, last hemoglobin 8.0 ? ?Family Communication:  Spoke with daughter at the bedside ? ?Disposition: ?Status is: Inpatient ?Remains inpatient appropriate because: Family leaning towards rehab at this point.  Trying to see if we can get off oxygen.  We will get a chest x-ray today see if there is a recurrence of pleural effusion. ? ?Planned Discharge Destination: Rehab ? ? ?Author: ?Loletha Grayer, MD ?07/22/2021 1:39 PM ? ?For on call review [email or web address].  ?

## 2021-07-22 NOTE — TOC Progression Note (Addendum)
Transition of Care (TOC) - Progression Note  ? ? ?Patient Details  ?Name: [place] T Wagman ?MRN: [id number] ?Date of Birth: June 03, 1925 ? ?Transition of Care (TOC) CM/SW Contact  ?Alberteen Sam, LCSW ?Phone Number: ?07/22/2021, 10:40 AM ? ?Clinical Narrative:    ? ?CSW provided bed offers to [place] [facility] vs [facility], she will speak to family and call CSW back with preference.  ? ? CSW received call back preference for [facility] ?  ? ?Expected Discharge Plan and Services ?  ?  ?  ?  ?  ?                ?  ?  ?  ?  ?  ?  ?  ?  ?  ?  ? ? ?Social Determinants of Health (SDOH) Interventions ?  ? ?Readmission Risk Interventions ?No flowsheet data found. ? ?

## 2021-07-23 ENCOUNTER — Inpatient Hospital Stay: Payer: Medicare Other

## 2021-07-23 DIAGNOSIS — R338 Other retention of urine: Secondary | ICD-10-CM | POA: Diagnosis not present

## 2021-07-23 DIAGNOSIS — J9601 Acute respiratory failure with hypoxia: Secondary | ICD-10-CM | POA: Diagnosis not present

## 2021-07-23 DIAGNOSIS — I5033 Acute on chronic diastolic (congestive) heart failure: Secondary | ICD-10-CM | POA: Diagnosis not present

## 2021-07-23 DIAGNOSIS — D696 Thrombocytopenia, unspecified: Secondary | ICD-10-CM

## 2021-07-23 DIAGNOSIS — J90 Pleural effusion, not elsewhere classified: Secondary | ICD-10-CM | POA: Diagnosis not present

## 2021-07-23 LAB — BODY FLUID CELL COUNT WITH DIFFERENTIAL
Eos, Fluid: 0 %
Lymphs, Fluid: 74 %
Monocyte-Macrophage-Serous Fluid: 21 %
Neutrophil Count, Fluid: 5 %
Total Nucleated Cell Count, Fluid: 357 cu mm

## 2021-07-23 LAB — BASIC METABOLIC PANEL
Anion gap: 6 (ref 5–15)
BUN: 25 mg/dL — ABNORMAL HIGH (ref 8–23)
CO2: 34 mmol/L — ABNORMAL HIGH (ref 22–32)
Calcium: 8.3 mg/dL — ABNORMAL LOW (ref 8.9–10.3)
Chloride: 95 mmol/L — ABNORMAL LOW (ref 98–111)
Creatinine, Ser: 1.06 mg/dL — ABNORMAL HIGH (ref 0.44–1.00)
GFR, Estimated: 49 mL/min — ABNORMAL LOW (ref >60)
Glucose, Bld: 82 mg/dL (ref 70–99)
Potassium: 3.5 mmol/L (ref 3.5–5.1)
Sodium: 135 mmol/L (ref 135–145)

## 2021-07-23 LAB — CULTURE, BLOOD (ROUTINE X 2)
Culture: NO GROWTH
Culture: NO GROWTH
Special Requests: ADEQUATE
Special Requests: ADEQUATE

## 2021-07-23 LAB — CBC
HCT: 26.8 % — ABNORMAL LOW (ref 36.0–46.0)
Hemoglobin: 8.2 g/dL — ABNORMAL LOW (ref 12.0–15.0)
MCH: 26.2 pg (ref 26.0–34.0)
MCHC: 30.6 g/dL (ref 30.0–36.0)
MCV: 85.6 fL (ref 80.0–100.0)
Platelets: 141 10*3/uL — ABNORMAL LOW (ref 150–400)
RBC: 3.13 MIL/uL — ABNORMAL LOW (ref 3.87–5.11)
RDW: 25.2 % — ABNORMAL HIGH (ref 11.5–15.5)
WBC: 4.4 10*3/uL (ref 4.0–10.5)
nRBC: 0 % (ref 0.0–0.2)

## 2021-07-23 LAB — LACTATE DEHYDROGENASE, PLEURAL OR PERITONEAL FLUID: LD, Fluid: 59 U/L — ABNORMAL HIGH (ref 3–23)

## 2021-07-23 LAB — PROTEIN, PLEURAL OR PERITONEAL FLUID: Total protein, fluid: <3 g/dL

## 2021-07-23 MED ORDER — METOPROLOL SUCCINATE ER 50 MG PO TB24
50.0000 mg | ORAL_TABLET | Freq: Two times a day (BID) | ORAL | Status: DC
  Filled 2021-07-23 (×2): qty 1

## 2021-07-23 MED ORDER — TORSEMIDE 20 MG PO TABS
20.0000 mg | ORAL_TABLET | Freq: Two times a day (BID) | ORAL | Status: DC
Start: 2021-07-23 — End: 2021-07-26
  Administered 2021-07-23 – 2021-07-26 (×6): 20 mg via ORAL
  Filled 2021-07-23 (×6): qty 1

## 2021-07-23 MED ORDER — TORSEMIDE 20 MG PO TABS
20.0000 mg | ORAL_TABLET | Freq: Every day | ORAL | Status: DC
Start: 2021-07-23 — End: 2021-07-23
  Administered 2021-07-23: 20 mg via ORAL
  Filled 2021-07-23: qty 1

## 2021-07-23 NOTE — Progress Notes (Addendum)
Mobility Specialist - Progress Note ? ? ? 07/23/21 1300  ?Mobility  ?Activity Ambulated with assistance in hallway;Stood at bedside;Dangled on edge of bed;Transferred from bed to chair  ?Level of Assistance Standby assist, set-up cues, supervision of patient - no hands on  ?Assistive Device Front wheel walker;None  ?Distance Ambulated (ft) 180 ft  ?Activity Response Tolerated well  ?$Mobility charge 1 Mobility  ? ? ?Pre-mobility: 96 HR, 96% SpO2 ?During mobility: 105 HR, 95% SpO2 ? ?Pt supine upon arrival using 2L with family and RN at bedside. Pt completes bed mobility and STS with supervision. Pt ambulates 172f with supervision voicing no complaints --- vc to keep looking forward. Returns to chair with alarm set, needs in reach and family at bedside. ? ?MMerrily Brittle?Mobility Specialist ?07/23/21, 1:47 PM ? ? ? ? ?

## 2021-07-23 NOTE — Progress Notes (Signed)
Cross Cover ?Metoprolol held tonight secondary to hypotension ? ?

## 2021-07-23 NOTE — Progress Notes (Signed)
?Progress Note ? ? ?Patient: Allison Novak RXV:[id number] DOB: December 18, 1925 DOA: 07/17/2021     6 ?DOS: the patient was seen and examined on 07/23/2021 ?  ?Brief hospital course: ?Allison Novak is a 88 y.o. female with medical history significant of dCHF, hypertension, hyperlipidemia, TIA, GERD, hypothyroidism, gout, atrial fibrillation and PE on Eliquis, CKD-3A, carotid artery stenosis, anemia, CAD, who presents with shortness breath.  BNP 484, chest x-ray showed moderate right pleural effusion with associated atelectasis.  Mild interstitial edema.  Patient was placed on IV Lasix.  Foley catheter was anchored on 3/12 for severe urinary tension.  Thoracentesis on 07/19/2021 removed 1200 mL of clear fluid.  Repeat thoracentesis on 07/23/2021 and removed 400 mL of fluid. ? ?Assessment and Plan: ?* Acute on chronic diastolic CHF (congestive heart failure) (Allison Novak) ?I started torsemide 20 mg yesterday evening and will continue that twice a day.  Increase Toprol-XL to 50 mg twice a day.  Echocardiogram showed ejection fraction 55 to 60% with moderate pulmonary hypertension.  Still unable to get off oxygen at this point. ? ? ?Acute respiratory failure with hypoxia (Allison Novak) ?2 days ago pulse ox 86% on room air.  Today with pulse ox of 100% on 2 L.  Still trying to taper off oxygen ? ?Pleural effusion ?1.2 L of fluid taken off from underneath the right lung on 07/19/2021.  400 mL drawn off underneath the right lung today.  Need to watch closely for reaccumulation of fluid.  Continue torsemide.  Cytology and flow cytometry sent off again. ? ?Acute urinary retention ?Since oxybutynin was discontinued we will try to remove Foley catheter today ? ?Atrial fibrillation, chronic (Allison Novak) ?Continue Eliquis for anticoagulation.   Toprol-XL to 50 mg increased to twice daily dosing ? ?Chronic kidney disease, stage 3a (Allison Novak) ?Creatinine 1.06 today.  Continue torsemide we will try twice daily dosing to see if I can get rid of some more fluid to get  her off oxygen.  Continue to watch creatinine closely ? ?History of TIA (transient ischemic attack) ?Continue Eliquis for stroke prevention ? ?Sepsis (Allison Novak) ?Ruled out ? ?Iron deficiency anemia ?Received IV Venofer 07/21/2021.  Last hemoglobin 8.2. ? ?Thrombocytopenia (Allison Novak) ?Mild thrombocytopenia.  Patient is not on heparin.  Continue to follow. ? ?Hypothermia ?Temperature has improved.  Cortisol level 25.1, no adrenal insufficiency. ? ?History of pulmonary embolism ?Continue Eliquis. ? ?Hyponatremia ?Last sodium 135. ? ?Hyperlipidemia ?Continue atorvastatin ? ? ? ? ?  ? ?Subjective: Patient still has some shortness of breath and some cough.  Repeat thoracentesis today drop 400 mL.  Chest x-ray still shows CHF. ? ?Physical Exam: ?Vitals:  ? 07/23/21 0500 07/23/21 0727 07/23/21 1053 07/23/21 1132  ?BP:  106/70 (!) 90/58 91/61  ?Pulse:  75 83 91  ?Resp:  17    ?Temp:  97.8 ?F (36.6 ?C)    ?TempSrc:      ?SpO2:  96% 100% 100%  ?Weight: 59 kg     ?Height:      ? ?Physical Exam ?HENT:  ?   Head: Normocephalic.  ?   Mouth/Throat:  ?   Pharynx: No oropharyngeal exudate.  ?Eyes:  ?   General: Lids are normal.  ?   Conjunctiva/sclera: Conjunctivae normal.  ?Cardiovascular:  ?   Rate and Rhythm: Tachycardia present. Rhythm irregularly irregular.  ?   Heart sounds: Normal heart sounds, S1 normal and S2 normal.  ?Pulmonary:  ?   Breath sounds: Examination of the right-lower field reveals decreased breath sounds. Examination of  the left-lower field reveals decreased breath sounds. Decreased breath sounds present. No wheezing, rhonchi or rales.  ?Abdominal:  ?   Palpations: Abdomen is soft.  ?   Tenderness: There is no abdominal tenderness.  ?Musculoskeletal:  ?   Right lower leg: Swelling present.  ?   Left lower leg: Swelling present.  ?Skin: ?   General: Skin is warm.  ?   Findings: No rash.  ?Neurological:  ?   Mental Status: She is alert and oriented to person, place, and time.  ?  ?Data Reviewed: ?Creatinine 1.06 today CO2 34  and chloride 95, hemoglobin 8.2 and platelet count 141 ? ?Family Communication: Spoke with daughter at the bedside ? ?Disposition: ?Status is: Inpatient ?Remains inpatient appropriate because: Quick reaccumulation of pleural fluid and CHF still showing on chest x-ray we will continue diuresis and monitor. ? ?Planned Discharge Destination: Rehab ? ? ?Author: ?Loletha Grayer, MD ?07/23/2021 4:44 PM ? ?For on call review [email or web address].  ?

## 2021-07-23 NOTE — Care Management Important Message (Signed)
Important Message ? ?Patient Details  ?Name: Allison Novak ?MRN: [id number] ?Date of Birth: 05-18-1925 ? ? ?Medicare Important Message Given:  Yes ? ? ? ? ?Dannette Barbara ?07/23/2021, 3:17 PM ?

## 2021-07-23 NOTE — Progress Notes (Signed)
Physical Therapy Treatment ?Patient Details ?Name: Allison Novak ?MRN: [id number] ?DOB: April 13, 1926 ?Today's Date: 07/23/2021 ? ? ?History of Present Illness 88 y/o F admitted 07/17/21 with SOB. Chest x-ray showed moderate right pleural effusion with associated atelectasis. Pt had thoracentsis on 07/19/21 which yielded 1200 of clear yellow fluid. PMH: dCHF, HTN, HLD, TIA, GERD, hypothyroidism, gout, a-fib & PE on eliquis, CKD-3A, carotid artery stenosis, anemia, CAD ? ?  ?PT Comments  ? ? Physical Therapy Treatment completed on this date. Patient tolerated session well and was agreeable to treatment. Upon entry into room patient was seated in recliner with family present. No pain reported throughout session, and entire session completed on 2L O2 via [place]. Patient demonstrated good balance when attempted to don shows, however author assisted for sake of time. Patient complete sit to stand transfer from recliner with RW at supervision. She demonstrated increased tolerance for ambulation distance, ambulating ~320 at supervision with no LOB. HR ranged from 105-125bpm throughout session. Patient left in recliner with all needs met and family present. Patient is progressing towards her goals and would continue to benefit from skilled physical therapy in order to optimize patient's return to PLOF. Continue to recommend STR upon discharge from acute hospitalization.  ?   ?Recommendations for follow up therapy are one component of a multi-disciplinary discharge planning process, led by the attending physician.  Recommendations may be updated based on patient status, additional functional criteria and insurance authorization. ? ?Follow Up Recommendations ? Skilled nursing-short term rehab (<3 hours/day) ?  ?  ?Assistance Recommended at Discharge Frequent or constant Supervision/Assistance  ?Patient can return home with the following A little help with walking and/or transfers;A little help with bathing/dressing/bathroom;Assistance  with cooking/housework;Direct supervision/assist for financial management;Assist for transportation;Help with stairs or ramp for entrance ?  ?Equipment Recommendations ? Rolling walker (2 wheels)  ?  ?Recommendations for Other Services   ? ? ?  ?Precautions / Restrictions Precautions ?Precautions: Fall ?Precaution Comments: foley catheter ?Restrictions ?Weight Bearing Restrictions: No  ?  ? ?Mobility ? Bed Mobility ?Overal bed mobility:  (not assessed as patient started and ended session in recliner) ?  ?  ?  ?  ?  ?  ?  ?  ? ?Transfers ?Overall transfer level: Needs assistance ?Equipment used: Rolling walker (2 wheels) ?Transfers: Sit to/from Stand ?Sit to Stand: Supervision ?  ?  ?  ?  ?  ?General transfer comment: no physical assistance required ?  ? ?Ambulation/Gait ?Ambulation/Gait assistance: Supervision ?Gait Distance (Feet): 320 Feet ?Assistive device: Rolling walker (2 wheels) ?Gait Pattern/deviations: Decreased step length - right, Decreased step length - left, Decreased stride length ?Gait velocity: decreased ?  ?  ?General Gait Details: partial step through gait pattern ? ? ?Stairs ?  ?  ?  ?  ?  ? ? ?Wheelchair Mobility ?  ? ?Modified Rankin (Stroke Patients Only) ?  ? ? ?  ?Balance Overall balance assessment: Needs assistance ?Sitting-balance support: No upper extremity supported, Feet supported ?Sitting balance-Leahy Scale: Good ?Sitting balance - Comments: steady sitting reaching within BOS ?  ?Standing balance support: Bilateral upper extremity supported, During functional activity, Reliant on assistive device for balance ?Standing balance-Leahy Scale: Good ?Standing balance comment: no loss of balance ambulating with RW ?  ?  ?  ?  ?  ?  ?  ?  ?  ?  ?  ?  ? ?  ?Cognition Arousal/Alertness: Awake/alert ?Behavior During Therapy: [facility] for tasks assessed/performed ?Overall Cognitive Status: Within Functional Limits  for tasks assessed ?  ?  ?  ?  ?  ?  ?  ?  ?  ?  ?  ?  ?  ?  ?  ?  ?General Comments:  Pleasant lady ?  ?  ? ?  ?Exercises   ? ?  ?General Comments General comments (skin integrity, edema, etc.): 2L O2 via [place] SpO2 remained >90%, HR ranged from 105bpm (at rest)- 125bpm(at conclusion of ambulation) ?  ?  ? ?Pertinent Vitals/Pain Pain Assessment ?Pain Assessment: No/denies pain  ? ? ?Home Living   ?  ?  ?  ?  ?  ?  ?  ?  ?  ?   ?  ?Prior Function    ?  ?  ?   ? ?PT Goals (current goals can now be found in the care plan section) Acute Rehab PT Goals ?Patient Stated Goal: get better ?PT Goal Formulation: With patient ?Time For Goal Achievement: 08/02/21 ?Potential to Achieve Goals: Good ?Progress towards PT goals: Progressing toward goals ? ?  ?Frequency ? ? ? Min 2X/week ? ? ? ?  ?PT Plan Current plan remains appropriate  ? ? ?Co-evaluation   ?  ?  ?  ?  ? ?  ?AM-PAC PT "6 Clicks" Mobility   ?Outcome Measure ? Help needed turning from your back to your side while in a flat bed without using bedrails?: None ?Help needed moving from lying on your back to sitting on the side of a flat bed without using bedrails?: A Little ?Help needed moving to and from a bed to a chair (including a wheelchair)?: A Little ?Help needed standing up from a chair using your arms (e.g., wheelchair or bedside chair)?: A Little ?Help needed to walk in hospital room?: A Little ?Help needed climbing 3-5 steps with a railing? : A Lot ?6 Click Score: 18 ? ?  ?End of Session Equipment Utilized During Treatment: Oxygen;Gait belt ?Activity Tolerance: Patient tolerated treatment well ?Patient left: in chair;with call bell/phone within reach;with chair alarm set;with family/visitor present ?Nurse Communication: Mobility status ?PT Visit Diagnosis: Muscle weakness (generalized) (M62.81);Difficulty in walking, not elsewhere classified (R26.2);Unsteadiness on feet (R26.81) ?  ? ? ?Time: 7290-2111 ?PT Time Calculation (min) (ACUTE ONLY): 20 min ? ?Charges:  $Gait Training: 8-22 mins          ?          ? ?Iva Boop, PT  ?07/23/21. 2:20  PM ? ? ?

## 2021-07-23 NOTE — TOC Progression Note (Signed)
Transition of Care (TOC) - Progression Note  ? ? ?Patient Details  ?Name: Allison Novak ?MRN: [id number] ?Date of Birth: [birth year] ? ?Transition of Care (TOC) CM/SW Contact  ?Alberteen Sam, LCSW ?Phone Number: ?07/23/2021, 9:24 AM ? ?Clinical Narrative:    ? ? ?[facility] updated that patient is not medically ready today and may be ready over the weekend.  ? ?Patient has bed at [facility] when medically stable.  ? ?  ?  ? ?Expected Discharge Plan and Services ?  ?  ?  ?  ?  ?                ?  ?  ?  ?  ?  ?  ?  ?  ?  ?  ? ? ?Social Determinants of Health (SDOH) Interventions ?  ? ?Readmission Risk Interventions ?No flowsheet data found. ? ?

## 2021-07-23 NOTE — Procedures (Signed)
Ultrasound-guided diagnostic and therapeutic right sided thoracentesis performed yielding 400 milliliters of amber colored fluid. No immediate complications.   Diagnostic fluid was sent to the lab for further analysis. Follow-up chest x-ray pending. EBL is < 2 ml.     ? ?

## 2021-07-23 NOTE — Progress Notes (Signed)
?   07/23/21 2144 07/23/21 2217  ?Assess: MEWS Score  ?Temp  --  98.7 ?F (37.1 ?C)  ?BP (!) 79/53 ?(rechecking BP) (!) 80/40  ?Pulse Rate  --  84  ?Resp  --  14  ?Level of Consciousness  --  Alert  ?SpO2  --  99 %  ?O2 Device  --  Nasal Cannula  ?O2 Flow Rate (L/min)  --  2 L/min  ?Assess: MEWS Score  ?MEWS Temp 0 0  ?MEWS Systolic 2 2  ?MEWS Pulse 0 0  ?MEWS RR 0 0  ?MEWS LOC 0 0  ?MEWS Score 2 2  ?MEWS Score Color Yellow Yellow  ?Assess: if the MEWS score is Yellow or Red  ?Were vital signs taken at a resting state?  --  Yes  ?Focused Assessment  --  Change from prior assessment (see assessment flowsheet)  ?Does the patient meet 2 or more of the SIRS criteria?  --  No  ?MEWS guidelines implemented *See Row Information*  --  Yes  ?Treat  ?Pain Scale  --  0-10  ?Pain Score  --  0  ?Take Vital Signs  ?Increase Vital Sign Frequency   --  Yellow: Q 2hr X 2 then Q 4hr X 2, if remains yellow, continue Q 4hrs  ?Escalate  ?MEWS: Escalate  --  Yellow: discuss with charge nurse/RN and consider discussing with provider and RRT  ?Notify: Charge Nurse/RN  ?Name of Charge Nurse/RN Notified  --  Holley Dexter  ?Date Charge Nurse/RN Notified  --  07/23/21  ?Time Charge Nurse/RN Notified  --  2150  ?Notify: Provider  ?Provider Name/Title  --  Rachael Fee  ?Date Provider Notified  --  07/23/21  ?Time Provider Notified  --  2225  ?Notification Type  --  Page ?(secure chat)  ?Notification Reason  --  Change in status  ?Provider response  --  See new orders ?(Hold PM metoprolo)  ?Date of Provider Response  --  07/23/21  ?Time of Provider Response  --  2235  ?Assess: SIRS CRITERIA  ?SIRS Temperature   --  0  ?SIRS Pulse  --  0  ?SIRS Respirations   --  0  ?SIRS WBC  --  0  ?SIRS Score Sum   --  0  ? ?Patient's BP recheck manually was still low. Her Metoprolo was increased today. NP notified to hold bedtime dose. Patient is asymptomatic. Will continue to follow the yellow MEWS  ?

## 2021-07-24 DIAGNOSIS — J9601 Acute respiratory failure with hypoxia: Secondary | ICD-10-CM | POA: Diagnosis not present

## 2021-07-24 DIAGNOSIS — I482 Chronic atrial fibrillation, unspecified: Secondary | ICD-10-CM | POA: Diagnosis not present

## 2021-07-24 DIAGNOSIS — I5033 Acute on chronic diastolic (congestive) heart failure: Secondary | ICD-10-CM | POA: Diagnosis not present

## 2021-07-24 DIAGNOSIS — R338 Other retention of urine: Secondary | ICD-10-CM | POA: Diagnosis not present

## 2021-07-24 LAB — CBC
HCT: 27.4 % — ABNORMAL LOW (ref 36.0–46.0)
Hemoglobin: 8.5 g/dL — ABNORMAL LOW (ref 12.0–15.0)
MCH: 26.5 pg (ref 26.0–34.0)
MCHC: 31 g/dL (ref 30.0–36.0)
MCV: 85.4 fL (ref 80.0–100.0)
Platelets: 160 10*3/uL (ref 150–400)
RBC: 3.21 MIL/uL — ABNORMAL LOW (ref 3.87–5.11)
RDW: 24.6 % — ABNORMAL HIGH (ref 11.5–15.5)
WBC: 4.2 10*3/uL (ref 4.0–10.5)
nRBC: 0 % (ref 0.0–0.2)

## 2021-07-24 LAB — BASIC METABOLIC PANEL
Anion gap: 7 (ref 5–15)
BUN: 28 mg/dL — ABNORMAL HIGH (ref 8–23)
CO2: 34 mmol/L — ABNORMAL HIGH (ref 22–32)
Calcium: 8.1 mg/dL — ABNORMAL LOW (ref 8.9–10.3)
Chloride: 93 mmol/L — ABNORMAL LOW (ref 98–111)
Creatinine, Ser: 1.09 mg/dL — ABNORMAL HIGH (ref 0.44–1.00)
GFR, Estimated: 47 mL/min — ABNORMAL LOW (ref >60)
Glucose, Bld: 85 mg/dL (ref 70–99)
Potassium: 3.6 mmol/L (ref 3.5–5.1)
Sodium: 134 mmol/L — ABNORMAL LOW (ref 135–145)

## 2021-07-24 MED ORDER — POLYETHYLENE GLYCOL 3350 17 G PO PACK: 17.0000 g | PACK | Freq: Every day | ORAL | 0 refills | Status: DC | PRN

## 2021-07-24 MED ORDER — METOPROLOL SUCCINATE ER 50 MG PO TB24
50.0000 mg | ORAL_TABLET | Freq: Every day | ORAL | Status: DC
  Administered 2021-07-24 – 2021-07-26 (×3): 50 mg via ORAL
  Filled 2021-07-24 (×3): qty 1

## 2021-07-24 MED ORDER — TORSEMIDE 20 MG PO TABS
20.0000 mg | ORAL_TABLET | Freq: Two times a day (BID) | ORAL | 0 refills | Status: DC
Start: 2021-07-24 — End: 2022-05-04

## 2021-07-24 MED ORDER — METOPROLOL SUCCINATE ER 25 MG PO TB24
25.0000 mg | ORAL_TABLET | Freq: Two times a day (BID) | ORAL | Status: DC
Start: 2021-07-24 — End: 2021-07-24

## 2021-07-24 MED ORDER — APIXABAN 5 MG PO TABS: 5.0000 mg | ORAL_TABLET | Freq: Two times a day (BID) | ORAL | Status: DC

## 2021-07-24 NOTE — Progress Notes (Signed)
?Progress Note ? ? ?Patient: Allison Novak [id number]:[id number] DOB: [birth year] DOA: 07/17/2021     7 ?DOS: the patient was seen and examined on 07/24/2021 ?  ?Brief hospital course: ?Allison Novak is a 88 y.o. female with medical history significant of dCHF, hypertension, hyperlipidemia, TIA, GERD, hypothyroidism, gout, atrial fibrillation and PE on Eliquis, CKD-3A, carotid artery stenosis, anemia, CAD, who presents with shortness breath.  BNP 484, chest x-ray showed moderate right pleural effusion with associated atelectasis.  Mild interstitial edema.  Patient was initially placed on IV Lasix.  Foley catheter was anchored on 3/12 for severe urinary retention.  Thoracentesis on 07/19/2021 removed 1200 mL of clear fluid.  Repeat thoracentesis on 07/23/2021 and removed 400 mL of fluid.  Trying to take out Foley catheter on 07/24/2021.  Patient now on oral torsemide 20 mg twice a day.  Trying to balance blood pressure heart rate and fluid. ? ?Assessment and Plan: ?* Acute on chronic diastolic CHF (congestive heart failure) ([place]) ?Continue torsemide 20 mg twice a day and check kidney function closely. Decrease Toprol-XL to 50 mg daily.  Echocardiogram showed ejection fraction 55 to 60% with moderate pulmonary hypertension.  Still trying to see if we can get off oxygen ? ? ?Acute respiratory failure with hypoxia ([place]) ?2 days ago pulse ox 86% on room air.  Today with pulse ox of 96% on 2 L.  Looks like 99% on 1 L this afternoon.  Try to take off oxygen and see what happens ? ?Pleural effusion on right ?1.2 L of fluid taken off from underneath the right lung on 07/19/2021.  400 mL drawn off underneath the right lung on 07/23/2021.  Need to watch closely for reaccumulation of fluid.  Continue torsemide.  Cytology and flow cytometry sent off again.  Will need to follow-up with pulmonary as outpatient ? ?Acute urinary retention ?Discontinued oxybutynin.  Removed Foley catheter on 07/24/2021 ? ?Atrial fibrillation, chronic  (Allison Novak) ?Continue Eliquis for anticoagulation.   Toprol-XL to 50 mg decreased to daily dosing ? ?Chronic kidney disease, stage 3a (Allison Novak) ?Creatinine 1.09 today.  Continue torsemide we will try twice daily dosing to see if I can get rid of some more fluid to get her off oxygen.  Continue to watch creatinine closely. ? ?History of TIA (transient ischemic attack) ?Continue Eliquis for stroke prevention ? ?Sepsis (Allison Novak) ?Ruled out ? ?Iron deficiency anemia ?Received IV Venofer 07/21/2021.  Last hemoglobin 8.5. ? ?Thrombocytopenia (Allison Novak) ?Platelet count now in the normal range at 160,000 ? ?Hypothermia ?Temperature has improved.  Cortisol level 25.1, no adrenal insufficiency. ? ?History of pulmonary embolism ?Continue Eliquis. ? ?Hyponatremia ?Last sodium 134. ? ?Hyperlipidemia ?Continue atorvastatin ? ? ? ? ?  ? ?Subjective: Patient had some low blood pressure overnight but did not have any specific complaints.  Still has some shortness of breath.  Foley catheter taken out this morning.  Admitted with heart failure and found to have pleural effusion ? ?Physical Exam: ?Vitals:  ? 07/24/21 0006 07/24/21 0411 07/24/21 8453 07/24/21 1608  ?BP: 90/61 (!) 101/58 (!) 106/57 106/65  ?Pulse: 84 86 84 79  ?Resp: '16 14 18   '$ ?Temp: 98.2 ?F (36.8 ?C) 98.1 ?F (36.7 ?C) 97.9 ?F (36.6 ?C)   ?TempSrc:      ?SpO2: 98% 96% 96% 99%  ?Weight:  59.6 kg    ?Height:      ? ?Physical Exam ?HENT:  ?   Head: Normocephalic.  ?   Mouth/Throat:  ?   Pharynx: No  oropharyngeal exudate.  ?Eyes:  ?   General: Lids are normal.  ?   Conjunctiva/sclera: Conjunctivae normal.  ?Cardiovascular:  ?   Rate and Rhythm: Normal rate. Rhythm irregularly irregular.  ?   Heart sounds: Normal heart sounds, S1 normal and S2 normal.  ?Pulmonary:  ?   Breath sounds: No decreased breath sounds, wheezing, rhonchi or rales.  ?Abdominal:  ?   Palpations: Abdomen is soft.  ?   Tenderness: There is no abdominal tenderness.  ?Musculoskeletal:  ?   Right lower leg: Swelling present.   ?   Left lower leg: Swelling present.  ?Skin: ?   General: Skin is warm.  ?   Findings: No rash.  ?Neurological:  ?   Mental Status: She is alert and oriented to person, place, and time.  ?  ?Data Reviewed: ?Last hemoglobin 8.5, creatinine 1.0 ? ?Family Communication: Spoke with daughter at the bedside ? ?Disposition: ?Status is: Inpatient ?Remains inpatient appropriate because: Still trying to balance low blood pressure, fluid removal, and heart rate ? ?Planned Discharge Destination: Rehab.  Evaluate daily on when to go out to rehab at this point. ? ? ?Author: ?Loletha Grayer, MD ?07/24/2021 4:59 PM ? ?For on call review [email or web address].  ?

## 2021-07-24 NOTE — Discharge Summary (Signed)
?Physician Discharge Summary ?  ?Patient: Allison Novak MRN: [id number] DOB: [birth year]  ?Admit date:     07/17/2021  ?Discharge date: 07/25/21  ?Discharge Physician: Loletha Grayer  ? ?PCP: Adin Hector, MD  ? ?Recommendations at discharge:  ? ?Follow-up team at rehab 1 day ?Follow-up with Dr. Lanney Gins pulmonary 1 week ?Follow-up CHF clinic ?Follow-up Dr. Corky Sox cardiology 2 weeks ? ?Discharge Diagnoses: ?Principal Problem: ?  Acute on chronic diastolic CHF (congestive heart failure) ([place]) ?Active Problems: ?  Acute respiratory failure with hypoxia ([place]) ?  Pleural effusion on right ?  Acute urinary retention ?  Atrial fibrillation, chronic ([place]) ?  Chronic kidney disease, stage 3a ([place]) ?  History of TIA (transient ischemic attack) ?  Sepsis ([place]) ?  Hyperlipidemia ?  Hyponatremia ?  Hypothyroidism ?  History of pulmonary embolism ?  Hypothermia ?  Thrombocytopenia ([place]) ?  Moderate pulmonary hypertension ([place]) ?  Iron deficiency anemia ? ? ?Hospital Course: ?[name] is a 88 y.o. female with medical history significant of dCHF, hypertension, hyperlipidemia, TIA, GERD, hypothyroidism, gout, atrial fibrillation and PE on Eliquis, CKD-3A, carotid artery stenosis, anemia, CAD, who presents with shortness breath.  BNP 484, chest x-ray showed moderate right pleural effusion with associated atelectasis.  Mild interstitial edema.  Patient was placed on IV Lasix.  Foley catheter was anchored on 3/12 for severe urinary tension.  Thoracentesis on 07/19/2021 removed 1200 mL of clear fluid.  Repeat thoracentesis on 07/23/2021 and removed 400 mL of fluid. ? ?Assessment and Plan: ?* Acute on chronic diastolic CHF (congestive heart failure) ([place]) ?I started torsemide 20 mg yesterday evening and will continue that twice a day.  Increase Toprol-XL to 50 mg twice a day.  Echocardiogram showed ejection fraction 55 to 60% with moderate pulmonary hypertension.  Still unable to get off oxygen at this point. ? ? ?Acute  respiratory failure with hypoxia ([place]) ?2 days ago pulse ox 86% on room air.  Today with pulse ox of 100% on 2 L.  Still trying to taper off oxygen ? ?Pleural effusion on right ?1.2 L of fluid taken off from underneath the right lung on 07/19/2021.  400 mL drawn off underneath the right lung today.  Need to watch closely for reaccumulation of fluid.  Continue torsemide.  Cytology and flow cytometry sent off again. ? ?Acute urinary retention ?Since oxybutynin was discontinued we will try to remove Foley catheter today ? ?Atrial fibrillation, chronic ([place]) ?Continue Eliquis for anticoagulation.   Toprol-XL to 50 mg increased to twice daily dosing ? ?Chronic kidney disease, stage 3a ([place]) ?Creatinine 1.06 today.  Continue torsemide we will try twice daily dosing to see if I can get rid of some more fluid to get her off oxygen.  Continue to watch creatinine closely ? ?History of TIA (transient ischemic attack) ?Continue Eliquis for stroke prevention ? ?Sepsis ([place]) ?Ruled out ? ?Iron deficiency anemia ?Received IV Venofer 07/21/2021.  Last hemoglobin 8.2. ? ?Thrombocytopenia ([place]) ?Mild thrombocytopenia.  Patient is not on heparin.  Continue to follow. ? ?Hypothermia ?Temperature has improved.  Cortisol level 25.1, no adrenal insufficiency. ? ?History of pulmonary embolism ?Continue Eliquis. ? ?Hyponatremia ?Last sodium 135. ? ?Hyperlipidemia ?Continue atorvastatin ? ? ? ? ?  ? ? ?Consultants: Interventional radiology ?Procedures performed: Right-sided thoracentesis x2 ?Disposition: Rehabilitation facility ?Diet recommendation:  ?Cardiac diet ?DISCHARGE MEDICATION: ?Allergies as of 07/24/2021   ? ?   Reactions  ? Keflex [cephalexin] Other (See Comments)  ? Pt cant remember  exact reaction  ? Macrodantin [nitrofurantoin Macrocrystal] Other (See Comments)  ? ?  ? ?  ?Medication List  ?  ? ?STOP taking these medications   ? ?furosemide 20 MG tablet ?Commonly known as: Lasix ?  ?oxybutynin 5 MG tablet ?Commonly known as:  DITROPAN ?  ? ?  ? ?TAKE these medications   ? ?allopurinol 100 MG tablet ?Commonly known as: ZYLOPRIM ?Take 100 mg by mouth daily. ?  ?apixaban 5 MG Tabs tablet ?Commonly known as: ELIQUIS ?Take 1 tablet (5 mg total) by mouth 2 (two) times daily. ?What changed:  ?medication strength ?how much to take ?  ?atorvastatin 40 MG tablet ?Commonly known as: LIPITOR ?Take 40 mg by mouth daily. ?  ?azelastine 0.1 % nasal spray ?Commonly known as: ASTELIN ?Place 1 spray into both nostrils daily. ?  ?calcium carbonate 1500 (600 Ca) MG Tabs tablet ?Commonly known as: OSCAL ?Take 1 tablet by mouth in the morning and at bedtime. ?  ?Cholecalciferol 50 MCG (2000 UT) Caps ?Take 2,000 Units by mouth daily. ?  ?levothyroxine 25 MCG tablet ?Commonly known as: SYNTHROID ?Take 25 mcg by mouth daily. ?  ?metoprolol succinate 50 MG 24 hr tablet ?Commonly known as: TOPROL-XL ?Take 1 tablet (50 mg total) by mouth daily. Take with or immediately following a meal. ?  ?omeprazole 40 MG capsule ?Commonly known as: PRILOSEC ?Take 40 mg by mouth daily. ?  ?polyethylene glycol 17 g packet ?Commonly known as: MIRALAX / GLYCOLAX ?Take 17 g by mouth daily as needed for moderate constipation. ?  ?potassium chloride 10 MEQ tablet ?Commonly known as: KLOR-CON ?Take 1 tablet (10 mEq total) by mouth daily. ?  ?torsemide 20 MG tablet ?Commonly known as: DEMADEX ?Take 1 tablet (20 mg total) by mouth 2 (two) times daily. ?  ? ?  ? ? ?Discharge Exam: ?Filed Weights  ? 07/22/21 1118 07/23/21 0500 07/24/21 0411  ?Weight: 61.9 kg 59 kg 59.6 kg  ? ?Physical Exam ?HENT:  ?   Head: Normocephalic.  ?   Mouth/Throat:  ?   Pharynx: No oropharyngeal exudate.  ?Eyes:  ?   General: Lids are normal.  ?   Conjunctiva/sclera: Conjunctivae normal.  ?Cardiovascular:  ?   Rate and Rhythm: Normal rate. Rhythm irregularly irregular.  ?   Heart sounds: Normal heart sounds, S1 normal and S2 normal.  ?Pulmonary:  ?   Breath sounds: No decreased breath sounds, wheezing, rhonchi or  rales.  ?Abdominal:  ?   Palpations: Abdomen is soft.  ?   Tenderness: There is no abdominal tenderness.  ?Musculoskeletal:  ?   Right lower leg: Swelling present.  ?   Left lower leg: Swelling present.  ?Skin: ?   General: Skin is warm.  ?   Findings: No rash.  ?Neurological:  ?   Mental Status: She is alert and oriented to person, place, and time.  ?  ? ?Condition at discharge: stable ? ?The results of significant diagnostics from this hospitalization (including imaging, microbiology, ancillary and laboratory) are listed below for reference.  ? ?Imaging Studies: ?DG Chest 2 View ? ?Result Date: 07/22/2021 ?CLINICAL DATA:  88 year old female with history of cough. EXAM: CHEST - 2 VIEW COMPARISON:  07/19/2021 FINDINGS: The mediastinal contours are partially obscured. Unchanged cardiomegaly. Atherosclerotic calcification of the aortic arch. Interval increased obscuration of the bilateral costophrenic angles, right greater than left. Mildly increased cephalization of the pulmonary vasculature. Streaky bibasilar opacities are noted. No evidence of pneumothorax. No acute osseous abnormality. IMPRESSION: 1.  Small bilateral pleural effusions, increased from comparison. Associated bibasilar passive atelectasis. 2. Similar appearing cardiomegaly and pulmonary vascular congestion without overt pulmonary edema. 3.  Aortic Atherosclerosis (ICD10-I70.0). Electronically Signed   By: Ruthann Cancer M.D.   On: 07/22/2021 15:11  ? ?DG Chest 2 View ? ?Result Date: 07/17/2021 ?CLINICAL DATA:  Shortness of breath. Atrial fibrillation. Congestive heart failure. Edema. EXAM: CHEST - 2 VIEW COMPARISON:  06/07/2021 FINDINGS: Low lung volumes are present, causing crowding of the pulmonary vasculature. Suspected enlargement of the cardiopericardial silhouette with indistinct cardiac margins. Atherosclerotic calcification of the aortic arch. Hazy perihilar and basilar opacities potentially from edema or pneumonia. Suspected moderate right and  small left pleural effusions based on the lateral projection. Mild bilateral interstitial accentuation is increased from prior. Thoracic spondylosis. IMPRESSION: 1. Moderate right and small left pleural effusions wi

## 2021-07-24 NOTE — TOC Progression Note (Signed)
Transition of Care (TOC) - Progression Note  ? ? ?Patient Details  ?Name: Allison Novak ?MRN: [id number] ?Date of Birth: 1926-02-01 ? ?Transition of Care (TOC) CM/SW Contact  ?Alberteen Sam, LCSW ?Phone Number: ?07/24/2021, 2:07 PM ? ?Clinical Narrative:    ? ?[facility] dc summary by 1pm today if patient is to dc tomorrow.  ? ?CSW sent [facility] dc summary at 12:55 today. ? ?  ?  ? ?Expected Discharge Plan and Services ?  ?  ?  ?  ?  ?                ?  ?  ?  ?  ?  ?  ?  ?  ?  ?  ? ? ?Social Determinants of Health (SDOH) Interventions ?  ? ?Readmission Risk Interventions ?No flowsheet data found. ? ?

## 2021-07-24 NOTE — Progress Notes (Signed)
Mobility Specialist - Progress Note ? ? ? 07/24/21 1400  ?Mobility  ?Activity Ambulated independently in hallway;Stood at bedside;Dangled on edge of bed  ?Level of Assistance Independent after set-up  ?Assistive Device Front wheel walker  ?Distance Ambulated (ft) 320 ft  ?Activity Response Tolerated well  ?$Mobility charge 1 Mobility  ? ? ? ?During mobility: 78 HR, 98% SpO2 on 2L ? ?Pt sitting in chair upon arrival using 2L. Pt completes STS with supervision and ambulates 320 indep --- O2 remaining at 98% during ambulation on 2L. Pt voices no complaints and returns to chair with needs in reach, alarm set and family at bedside. ? ?Allison Novak ?Mobility Specialist ?07/24/21, 2:15 PM ? ? ? ? ?

## 2021-07-25 DIAGNOSIS — I5033 Acute on chronic diastolic (congestive) heart failure: Secondary | ICD-10-CM | POA: Diagnosis not present

## 2021-07-25 DIAGNOSIS — R338 Other retention of urine: Secondary | ICD-10-CM | POA: Diagnosis not present

## 2021-07-25 DIAGNOSIS — J9601 Acute respiratory failure with hypoxia: Secondary | ICD-10-CM | POA: Diagnosis not present

## 2021-07-25 DIAGNOSIS — J90 Pleural effusion, not elsewhere classified: Secondary | ICD-10-CM | POA: Diagnosis not present

## 2021-07-25 LAB — BASIC METABOLIC PANEL
Anion gap: 6 (ref 5–15)
BUN: 29 mg/dL — ABNORMAL HIGH (ref 8–23)
CO2: 34 mmol/L — ABNORMAL HIGH (ref 22–32)
Calcium: 8.6 mg/dL — ABNORMAL LOW (ref 8.9–10.3)
Chloride: 94 mmol/L — ABNORMAL LOW (ref 98–111)
Creatinine, Ser: 1.16 mg/dL — ABNORMAL HIGH (ref 0.44–1.00)
GFR, Estimated: 44 mL/min — ABNORMAL LOW (ref >60)
Glucose, Bld: 90 mg/dL (ref 70–99)
Potassium: 3.1 mmol/L — ABNORMAL LOW (ref 3.5–5.1)
Sodium: 134 mmol/L — ABNORMAL LOW (ref 135–145)

## 2021-07-25 LAB — PROTEIN, BODY FLUID (OTHER): Total Protein, Body Fluid Other: 1.7 g/dL

## 2021-07-25 LAB — HEMOGLOBIN: Hemoglobin: 8.7 g/dL — ABNORMAL LOW (ref 12.0–15.0)

## 2021-07-25 MED ORDER — POLYVINYL ALCOHOL 1.4 % OP SOLN
2.0000 [drp] | OPHTHALMIC | Status: DC | PRN
  Filled 2021-07-25: qty 15

## 2021-07-25 MED ORDER — POTASSIUM CHLORIDE CRYS ER 20 MEQ PO TBCR: 20.0000 meq | EXTENDED_RELEASE_TABLET | Freq: Two times a day (BID) | ORAL | 0 refills | Status: DC

## 2021-07-25 MED ORDER — POTASSIUM CHLORIDE CRYS ER 20 MEQ PO TBCR
40.0000 meq | EXTENDED_RELEASE_TABLET | Freq: Two times a day (BID) | ORAL | Status: DC
  Administered 2021-07-25 – 2021-07-26 (×3): 40 meq via ORAL
  Filled 2021-07-25 (×3): qty 2

## 2021-07-25 NOTE — Plan of Care (Signed)
  Problem: Health Behavior/Discharge Planning: Goal: Ability to manage health-related needs will improve Outcome: Progressing   Problem: Clinical Measurements: Goal: Respiratory complications will improve Outcome: Progressing   Problem: Clinical Measurements: Goal: Cardiovascular complication will be avoided Outcome: Progressing   Problem: Safety: Goal: Ability to remain free from injury will improve Outcome: Progressing   

## 2021-07-25 NOTE — Progress Notes (Signed)
Mobility Specialist - Progress Note ? ? 07/25/21 1400  ?Mobility  ?Activity Ambulated with assistance in hallway;Stood at bedside;Dangled on edge of bed  ?Level of Assistance Standby assist, set-up cues, supervision of patient - no hands on  ?Assistive Device Front wheel walker  ?Distance Ambulated (ft) 320 ft  ?Activity Response Tolerated well  ?$Mobility charge 1 Mobility  ? ? ? ?Pre-mobility: HR, BP, SpO2 ?During mobility:108  HR, 98% SpO2 on 1L ?During mobility: 83% SpO2 on RA ?Post-mobility: 89 HR, 96% SPO2 on 1L ? ?Pt sitting in chair upon arrival using 1L. Pt completes STS indep and ambulates supervision 123f on 1L. RN requests ambulation on RA and O2 does destat to 83% so author bumps back to 1L for one more lap. Pt voices mild SOB but still motivated to continue . Returns to bed with alarm set, needs in reach and RN notified. ? ?MMerrily Brittle?Mobility Specialist ?07/25/21, 2:56 PM ? ? ? ? ?

## 2021-07-25 NOTE — Progress Notes (Signed)
?Progress Note ? ? ?Patient: Allison Novak EXB:[id number] DOB: 1925-08-25 DOA: 07/17/2021     8 ?DOS: the patient was seen and examined on 07/25/2021 ?  ?Brief hospital course: ?[name] is a 88 y.o. female with medical history significant of dCHF, hypertension, hyperlipidemia, TIA, GERD, hypothyroidism, gout, atrial fibrillation and PE on Eliquis, CKD-3A, carotid artery stenosis, anemia, CAD, who presents with shortness breath.  BNP 484, chest x-ray showed moderate right pleural effusion with associated atelectasis.  Mild interstitial edema.  Patient was initially placed on IV Lasix.  Foley catheter was anchored on 3/12 for severe urinary retention.  Thoracentesis on 07/19/2021 removed 1200 mL of clear fluid.  Repeat thoracentesis on 07/23/2021 and removed 400 mL of fluid.  Trying to take out Foley catheter on 07/24/2021.  Patient now on oral torsemide 20 mg twice a day.  Trying to balance blood pressure heart rate and fluid. ? ?Assessment and Plan: ?* Acute on chronic diastolic CHF (congestive heart failure) ([place]) ?Continue torsemide 20 mg twice a day and check kidney function closely. Decrease Toprol-XL to 50 mg daily.  Echocardiogram showed ejection fraction 55 to 60% with moderate pulmonary hypertension.  Still trying to see if we can get off oxygen ? ? ?Acute respiratory failure with hypoxia ([place]) ?2 days ago pulse ox 86% on room air.  Today with pulse ox of 96% on 2 L.  Looks like 99% on 1 L this afternoon.  Try to take off oxygen and see what happens ? ?Pleural effusion on right ?1.2 L of fluid taken off from underneath the right lung on 07/19/2021.  400 mL drawn off underneath the right lung on 07/23/2021.  Need to watch closely for reaccumulation of fluid.  Continue torsemide.  Cytology and flow cytometry sent off again.  Will need to follow-up with pulmonary as outpatient ? ?Acute urinary retention ?Discontinued oxybutynin.  Removed Foley catheter on 07/24/2021 ? ?Atrial fibrillation, chronic  ([place]) ?Continue Eliquis for anticoagulation.   Toprol-XL to 50 mg decreased to daily dosing ? ?Chronic kidney disease, stage 3a ([place]) ?Creatinine 1.09 today.  Continue torsemide we will try twice daily dosing to see if I can get rid of some more fluid to get her off oxygen.  Continue to watch creatinine closely. ? ?History of TIA (transient ischemic attack) ?Continue Eliquis for stroke prevention ? ?Sepsis ([place]) ?Ruled out ? ?Iron deficiency anemia ?Received IV Venofer 07/21/2021.  Last hemoglobin 8.5. ? ?Thrombocytopenia ([place]) ?Platelet count now in the normal range at 160,000 ? ?Hypothermia ?Temperature has improved.  Cortisol level 25.1, no adrenal insufficiency. ? ?History of pulmonary embolism ?Continue Eliquis. ? ?Hyponatremia ?Last sodium 134. ? ?Hyperlipidemia ?Continue atorvastatin ? ? ? ? ?  ? ?Subjective: Patient still feels very weak.  Had some itchiness of her left thigh yesterday.  Admitted with CHF and pleural effusion. ? ?Physical Exam: ?Vitals:  ? 07/24/21 2343 07/25/21 0324 07/25/21 0500 07/25/21 1027  ?BP: (!) 99/57 105/61  107/65  ?Pulse: 93 77  98  ?Resp: '16 18  20  '$ ?Temp: 97.9 ?F (36.6 ?C) 97.9 ?F (36.6 ?C)  97.9 ?F (36.6 ?C)  ?TempSrc: Oral Oral  Oral  ?SpO2: 96% 95%  92%  ?Weight:   58.8 kg   ?Height:      ? ?Physical Exam ?HENT:  ?   Head: Normocephalic.  ?   Mouth/Throat:  ?   Pharynx: No oropharyngeal exudate.  ?Eyes:  ?   General: Lids are normal.  ?   Conjunctiva/sclera: Conjunctivae normal.  ?  Cardiovascular:  ?   Rate and Rhythm: Normal rate. Rhythm irregularly irregular.  ?   Heart sounds: Normal heart sounds, S1 normal and S2 normal.  ?Pulmonary:  ?   Breath sounds: No decreased breath sounds, wheezing, rhonchi or rales.  ?Abdominal:  ?   Palpations: Abdomen is soft.  ?   Tenderness: There is no abdominal tenderness.  ?Musculoskeletal:  ?   Right lower leg: Swelling present.  ?   Left lower leg: Swelling present.  ?Skin: ?   General: Skin is warm.  ?   Findings: No rash.   ?Neurological:  ?   Mental Status: She is alert and oriented to person, place, and time.  ?  ?Data Reviewed: ?Hemoglobin 8.7, creatinine 1.16, potassium 3.1, sodium 134 ? ? ?Family Communication: Spoke with daughter at the bedside ? ?Disposition: ?Status is: Inpatient ?Remains inpatient appropriate because: With creatinine rising and the need to get rid of fluid I will continue torsemide twice a day and recheck creatinine tomorrow.  Patient with poor inspiratory effort with trying to take a deep breath. ? ?Planned Discharge Destination: Rehab ? ? ?Author: ?Loletha Grayer, MD ?07/25/2021 4:03 PM ? ?For on call review [email or web address].  ?

## 2021-07-25 NOTE — Plan of Care (Signed)

## 2021-07-26 DIAGNOSIS — J90 Pleural effusion, not elsewhere classified: Secondary | ICD-10-CM | POA: Diagnosis not present

## 2021-07-26 DIAGNOSIS — R338 Other retention of urine: Secondary | ICD-10-CM | POA: Diagnosis not present

## 2021-07-26 DIAGNOSIS — J9601 Acute respiratory failure with hypoxia: Secondary | ICD-10-CM | POA: Diagnosis not present

## 2021-07-26 DIAGNOSIS — I5033 Acute on chronic diastolic (congestive) heart failure: Secondary | ICD-10-CM | POA: Diagnosis not present

## 2021-07-26 LAB — BASIC METABOLIC PANEL
Anion gap: 6 (ref 5–15)
BUN: 25 mg/dL — ABNORMAL HIGH (ref 8–23)
CO2: 32 mmol/L (ref 22–32)
Calcium: 8.2 mg/dL — ABNORMAL LOW (ref 8.9–10.3)
Chloride: 95 mmol/L — ABNORMAL LOW (ref 98–111)
Creatinine, Ser: 1 mg/dL (ref 0.44–1.00)
GFR, Estimated: 53 mL/min — ABNORMAL LOW (ref >60)
Glucose, Bld: 86 mg/dL (ref 70–99)
Potassium: 3.9 mmol/L (ref 3.5–5.1)
Sodium: 133 mmol/L — ABNORMAL LOW (ref 135–145)

## 2021-07-26 LAB — COMP PANEL: LEUKEMIA/LYMPHOMA
Immunophenotypic Profile: 13
PATH INTERP XXX-IMP: DETECTED

## 2021-07-26 LAB — RESP PANEL BY RT-PCR (FLU A&B, COVID) ARPGX2
Influenza A by PCR: NEGATIVE
Influenza B by PCR: NEGATIVE
SARS Coronavirus 2 by RT PCR: NEGATIVE

## 2021-07-26 LAB — CYTOLOGY - NON PAP

## 2021-07-26 MED ORDER — POLYVINYL ALCOHOL 1.4 % OP SOLN: 2.0000 [drp] | OPHTHALMIC | 0 refills | Status: DC | PRN

## 2021-07-26 NOTE — Progress Notes (Signed)
?  Reviewed pleural effusion results which showed a B-cell population which could be CLL, mantle cell lymphoma, B-cell lymphoma.  Case discussed with Dr. Janese Banks oncology and they will get the patient in for an appointment.  I also spoke and updated the patient's daughter on the phone. ? ?This is likely the cause of the patient's pleural effusion and fluid may reaccumulate.  I do have the patient on torsemide 20 mg twice a day to keep fluid moving in the right direction. ? ?Dr Loletha Grayer ?

## 2021-07-26 NOTE — Progress Notes (Signed)
SATURATION QUALIFICATIONS: (This note is used to comply with regulatory documentation for home oxygen) ? ?Patient Saturations on Room Air at Rest = 87 % ? ?Patient Saturations on Room Air while Ambulating = 87 % ? ?Patient Saturations on 0 Liters of oxygen while Ambulating = 0 % ? ?Please briefly explain why patient needs home oxygen: Pt oxygen at 87 % on RA. ? ?Pt was placed back on oxygen 1 liter now sating 96 % ?

## 2021-07-26 NOTE — Progress Notes (Signed)
Report called to [facility]. ?

## 2021-07-26 NOTE — Discharge Summary (Addendum)
?Physician Discharge Summary ?  ?Patient: Allison Novak MRN: [id number] DOB: 1926/04/02  ?Admit date:     07/17/2021  ?Discharge date: 07/26/21  ?Discharge Physician: Loletha Grayer  ? ?PCP: Adin Hector, MD  ? ?Recommendations at discharge:  ? ?Follow-up team at rehab ?Follow-up pulmonary 1 week ?Follow-up CHF clinic ?Follow-up cardiology 3 weeks ? ?Discharge Diagnoses: ?Principal Problem: ?  Acute on chronic diastolic CHF (congestive heart failure) ([place]) ?Active Problems: ?  Acute respiratory failure with hypoxia ([place]) ?  Pleural effusion on right ?  Acute urinary retention ?  Atrial fibrillation, chronic (Allison Novak) ?  Chronic kidney disease, stage 3a (Allison Novak) ?  History of TIA (transient ischemic attack) ?  Sepsis (Allison Novak) ?  Hyperlipidemia ?  Hyponatremia ?  Hypothyroidism ?  History of pulmonary embolism ?  Hypothermia ?  Thrombocytopenia (Allison Novak) ?  Moderate pulmonary hypertension (Allison Novak) ?  Iron deficiency anemia ? ? ?Hospital Course: ?Allison Novak is a 88 y.o. female with medical history significant of dCHF, hypertension, hyperlipidemia, TIA, GERD, hypothyroidism, gout, atrial fibrillation and PE on Eliquis, CKD-3A, carotid artery stenosis, anemia, CAD, who presents with shortness breath.  BNP 484, chest x-ray showed moderate right pleural effusion with associated atelectasis.  Mild interstitial edema.  Patient was initially placed on IV Lasix.  Foley catheter was anchored on 3/12 for severe urinary retention.  Thoracentesis on 07/19/2021 removed 1200 mL of clear fluid.  Repeat thoracentesis on 07/23/2021 and removed 400 mL of fluid.  Foley catheter discontinued on 07/24/2021.  Patient now on oral torsemide 20 mg twice a day.  Trying to balance blood pressure, heart rate, and fluid. ? ?Patient will need close follow-up with pulmonary as outpatient to see if another thoracentesis may be needed.  The patient was hypoxic on room air at 85% and will go out of the hospital on 1 L of oxygen.  Continue incentive spirometer  and deep breaths. ? ?Recommend checking BMP weekly. ? ?Spoke with pharmacist that the patient is a candidate for lower dose eliquis 2.'5mg'$  po twice a day (dose changed) ? ?Assessment and Plan: ?* Acute on chronic diastolic CHF (congestive heart failure) (Allison Novak) ?Continue torsemide 20 mg twice a day and check kidney function closely.  Today's creatinine is 1.0.  Continue Toprol-XL to 50 mg daily.  Echocardiogram showed ejection fraction 55 to 60% with moderate pulmonary hypertension.  Unable to come off oxygen today with pulse ox of 85% on room air. ? ? ?Acute respiratory failure with hypoxia (Allison Novak) ?Yesterday with pulse ox 85% on room air.  Patient placed back on 1 L of oxygen.  Patient taking very shallow breaths even with trying to do the incentive spirometer. ? ?Pleural effusion on right ?1.2 L of fluid taken off from underneath the right lung on 07/19/2021.  400 mL drawn off underneath the right lung on 07/23/2021.  Need to watch closely for reaccumulation of fluid.  Continue torsemide twice a day for now.  Cytology and flow cytometry sent off again and still pending.  Will need to follow-up with pulmonary as outpatient ? ?Acute urinary retention ?Discontinued oxybutynin.  Removed Foley catheter on 07/24/2021.  Patient is urinating on her own ? ?Atrial fibrillation, chronic ([place]) ?Continue Eliquis for anticoagulation.   Continue Toprol-XL to 50 mg daily.  Heart rates are variable but limited on medication secondary to low blood pressure. ? ?Chronic kidney disease, stage 3a (Allison Novak) ?Creatinine 1.00 today.  Continue torsemide twice a day.  Check creatinine tomorrow.  May have to cut  back to once a day dosing and trying to get off the oxygen at this point. ? ?History of TIA (transient ischemic attack) ?Continue Eliquis for stroke prevention ? ?Sepsis (Allison Novak) ?Ruled out ? ?Iron deficiency anemia ?Received IV Venofer 07/21/2021.  Last hemoglobin 8.7. ? ?Thrombocytopenia (Allison Novak) ?Platelet count now in the normal range at  160,000 ? ?Hypothermia ?Temperature has improved.  Cortisol level 25.1, no adrenal insufficiency. ? ?History of pulmonary embolism ?Continue Eliquis. ? ?Hyponatremia ?Last sodium 133. ? ?Hyperlipidemia ?Continue atorvastatin ? ? ? ? ?  ? ? ?Consultants: Interventional radiology ?Procedures performed: 2 thoracentesis right lung ?Disposition: Skilled nursing facility ?Diet recommendation:  ?Cardiac diet ?DISCHARGE MEDICATION: ?Allergies as of 07/26/2021   ? ?   Reactions  ? Keflex [cephalexin] Other (See Comments)  ? Pt cant remember exact reaction  ? Macrodantin [nitrofurantoin Macrocrystal] Other (See Comments)  ? ?  ? ?  ?Medication List  ?  ? ?STOP taking these medications   ? ?furosemide 20 MG tablet ?Commonly known as: Lasix ?  ?oxybutynin 5 MG tablet ?Commonly known as: DITROPAN ?  ?potassium chloride 10 MEQ tablet ?Commonly known as: KLOR-CON ?  ? ?  ? ?TAKE these medications   ? ?allopurinol 100 MG tablet ?Commonly known as: ZYLOPRIM ?Take 100 mg by mouth daily. ?  ?atorvastatin 40 MG tablet ?Commonly known as: LIPITOR ?Take 40 mg by mouth daily. ?  ?azelastine 0.1 % nasal spray ?Commonly known as: ASTELIN ?Place 1 spray into both nostrils daily. ?  ?calcium carbonate 1500 (600 Ca) MG Tabs tablet ?Commonly known as: OSCAL ?Take 1 tablet by mouth in the morning and at bedtime. ?  ?Cholecalciferol 50 MCG (2000 UT) Caps ?Take 2,000 Units by mouth daily. ?  ?Eliquis 2.5 MG Tabs tablet ?Generic drug: apixaban ?Take 2.5 mg by mouth 2 (two) times daily. ?  ?levothyroxine 25 MCG tablet ?Commonly known as: SYNTHROID ?Take 25 mcg by mouth daily. ?  ?metoprolol succinate 50 MG 24 hr tablet ?Commonly known as: TOPROL-XL ?Take 1 tablet (50 mg total) by mouth daily. Take with or immediately following a meal. ?  ?omeprazole 40 MG capsule ?Commonly known as: PRILOSEC ?Take 40 mg by mouth daily. ?  ?polyethylene glycol 17 g packet ?Commonly known as: MIRALAX / GLYCOLAX ?Take 17 g by mouth daily as needed for moderate  constipation. ?  ?polyvinyl alcohol 1.4 % ophthalmic solution ?Commonly known as: LIQUIFILM TEARS ?Place 2 drops into the left eye as needed for dry eyes. ?  ?potassium chloride SA 20 MEQ tablet ?Commonly known as: KLOR-CON M ?Take 1 tablet (20 mEq total) by mouth 2 (two) times daily. ?  ?torsemide 20 MG tablet ?Commonly known as: DEMADEX ?Take 1 tablet (20 mg total) by mouth 2 (two) times daily. ?  ? ?  ? ? Follow-up Information   ? ? Ottie Glazier, MD Follow up in 1 week(s).   ?Specialty: Pulmonary Disease ?Contact information: ?[address] ?[place] [address] ?[phone] ? ? ?  ?  ? ? Andrez Grime, MD Follow up in 3 week(s).   ?Specialty: Cardiology ?Contact information: ?[place] [address] ?[phone] ? ? ?  ?  ? ?  ?  ? ?  ? ?Discharge Exam: ?Filed Weights  ? 07/24/21 0411 07/25/21 0500 07/26/21 0413  ?Weight: 59.6 kg 58.8 kg 59.6 kg  ? ?Physical Exam ?HENT:  ?   Head: Normocephalic.  ?   Mouth/Throat:  ?   Pharynx: No oropharyngeal exudate.  ?Eyes:  ?  General: Lids are normal.  ?   Conjunctiva/sclera: Conjunctivae normal.  ?Cardiovascular:  ?   Rate and Rhythm: Tachycardia present. Rhythm irregularly irregular.  ?   Heart sounds: Normal heart sounds, S1 normal and S2 normal.  ?Pulmonary:  ?   Breath sounds: Examination of the right-lower field reveals decreased breath sounds. Examination of the left-lower field reveals decreased breath sounds. Decreased breath sounds present. No wheezing, rhonchi or rales.  ?Abdominal:  ?   Palpations: Abdomen is soft.  ?   Tenderness: There is no abdominal tenderness.  ?Musculoskeletal:  ?   Right lower leg: Swelling present.  ?   Left lower leg: Swelling present.  ?Skin: ?   General: Skin is warm.  ?   Findings: No rash.  ?Neurological:  ?   Mental Status: She is alert and oriented to person, place, and time.  ?  ? ?Condition at discharge: stable ? ?The results of significant diagnostics from this hospitalization (including  imaging, microbiology, ancillary and laboratory) are listed below for reference.  ? ?Imaging Studies: ?DG Chest 2 View ? ?Result Date: 07/22/2021 ?CLINICAL DATA:  88 year old female with history of cough. EXAM: CHEST - 2 V

## 2021-07-26 NOTE — TOC Transition Note (Signed)
Transition of Care (TOC) - CM/SW Discharge Note ? ? ?Patient Details  ?Name: Allison Novak ?MRN: [id number] ?Date of Birth: [birth year] ? ?Transition of Care (TOC) CM/SW Contact:  ?Alberteen Sam, LCSW ?Phone Number: ?07/26/2021, 9:43 AM ? ? ?Clinical Narrative:    ? ?Patient will DC to: ?Anticipated DC date: ?Family notified: ?Transport by: ? ?Per MD patient ready for DC to [facility]. RN, patient, patient's family, and facility notified of DC. Discharge Summary sent to facility. RN given number for report  [phone] Room 802. DC packet on chart. Ambulance transport requested for patient.  ?CSW signing off. ? ?Pricilla Riffle, LCSW ? ? ? ?Final next level of care: [place] ?Barriers to Discharge: No Barriers Identified ? ? ?Patient Goals and CMS Choice ?Patient states their goals for this hospitalization and ongoing recovery are:: to go home ?CMS Medicare.gov Compare Post Acute Care list provided to:: Patient ?Choice offered to / list presented to : Patient ? ?Discharge Placement ?  ?           ?Patient chooses bed at: [facility] [place] ?Patient to be transferred to facility by: [facility] ?Name of family member notified: Hoyle Sauer (daughter) ?Patient and family notified of of transfer: 07/26/21 ? ?Discharge Plan and Services ?  ?  ?           ?  ?  ?  ?  ?  ?  ?  ?  ?  ?  ? ?Social Determinants of Health (SDOH) Interventions ?  ? ? ?Readmission Risk Interventions ?No flowsheet data found. ? ? ? ? ?

## 2021-07-27 ENCOUNTER — Other Ambulatory Visit: Payer: Self-pay | Admitting: Obstetrics and Gynecology

## 2021-07-27 LAB — BODY FLUID CULTURE W GRAM STAIN: Culture: NO GROWTH

## 2021-07-28 ENCOUNTER — Other Ambulatory Visit: Payer: Self-pay

## 2021-07-28 ENCOUNTER — Inpatient Hospital Stay: Payer: Medicare Other

## 2021-07-28 ENCOUNTER — Inpatient Hospital Stay: Payer: Medicare Other | Attending: Oncology | Admitting: Oncology

## 2021-07-28 ENCOUNTER — Encounter: Payer: Self-pay | Admitting: Oncology

## 2021-07-28 VITALS — BP 99/50 | HR 86 | Temp 98.2°F | Resp 16 | Ht 62.0 in | Wt 133.4 lb

## 2021-07-28 DIAGNOSIS — Z86711 Personal history of pulmonary embolism: Secondary | ICD-10-CM | POA: Insufficient documentation

## 2021-07-28 DIAGNOSIS — D696 Thrombocytopenia, unspecified: Secondary | ICD-10-CM | POA: Diagnosis not present

## 2021-07-28 DIAGNOSIS — D649 Anemia, unspecified: Secondary | ICD-10-CM | POA: Insufficient documentation

## 2021-07-28 DIAGNOSIS — C8337 Diffuse large B-cell lymphoma, spleen: Secondary | ICD-10-CM | POA: Insufficient documentation

## 2021-07-28 DIAGNOSIS — E039 Hypothyroidism, unspecified: Secondary | ICD-10-CM | POA: Insufficient documentation

## 2021-07-28 DIAGNOSIS — Z79899 Other long term (current) drug therapy: Secondary | ICD-10-CM | POA: Insufficient documentation

## 2021-07-28 DIAGNOSIS — I4821 Permanent atrial fibrillation: Secondary | ICD-10-CM | POA: Diagnosis not present

## 2021-07-28 DIAGNOSIS — Z8673 Personal history of transient ischemic attack (TIA), and cerebral infarction without residual deficits: Secondary | ICD-10-CM | POA: Insufficient documentation

## 2021-07-28 DIAGNOSIS — Z7901 Long term (current) use of anticoagulants: Secondary | ICD-10-CM | POA: Insufficient documentation

## 2021-07-28 LAB — FOLATE: Folate: 14.2 ng/mL (ref >5.9)

## 2021-07-28 LAB — CBC WITH DIFFERENTIAL/PLATELET
Abs Immature Granulocytes: 0.01 10*3/uL (ref 0.00–0.07)
Basophils Absolute: 0.1 10*3/uL (ref 0.0–0.1)
Basophils Relative: 1 %
Eosinophils Absolute: 0.1 10*3/uL (ref 0.0–0.5)
Eosinophils Relative: 2 %
HCT: 29.6 % — ABNORMAL LOW (ref 36.0–46.0)
Hemoglobin: 9.1 g/dL — ABNORMAL LOW (ref 12.0–15.0)
Immature Granulocytes: 0 %
Lymphocytes Relative: 16 %
Lymphs Abs: 1.1 10*3/uL (ref 0.7–4.0)
MCH: 27.2 pg (ref 26.0–34.0)
MCHC: 30.7 g/dL (ref 30.0–36.0)
MCV: 88.4 fL (ref 80.0–100.0)
Monocytes Absolute: 0.8 10*3/uL (ref 0.1–1.0)
Monocytes Relative: 11 %
Neutro Abs: 4.9 10*3/uL (ref 1.7–7.7)
Neutrophils Relative %: 70 %
Platelets: 226 10*3/uL (ref 150–400)
RBC: 3.35 MIL/uL — ABNORMAL LOW (ref 3.87–5.11)
RDW: 25.8 % — ABNORMAL HIGH (ref 11.5–15.5)
WBC: 7 10*3/uL (ref 4.0–10.5)
nRBC: 0 % (ref 0.0–0.2)

## 2021-07-28 LAB — COMPREHENSIVE METABOLIC PANEL
ALT: 19 U/L (ref 0–44)
AST: 29 U/L (ref 15–41)
Albumin: 3.4 g/dL — ABNORMAL LOW (ref 3.5–5.0)
Alkaline Phosphatase: 85 U/L (ref 38–126)
Anion gap: 9 (ref 5–15)
BUN: 31 mg/dL — ABNORMAL HIGH (ref 8–23)
CO2: 29 mmol/L (ref 22–32)
Calcium: 8.6 mg/dL — ABNORMAL LOW (ref 8.9–10.3)
Chloride: 93 mmol/L — ABNORMAL LOW (ref 98–111)
Creatinine, Ser: 1.2 mg/dL — ABNORMAL HIGH (ref 0.44–1.00)
GFR, Estimated: 42 mL/min — ABNORMAL LOW (ref >60)
Glucose, Bld: 116 mg/dL — ABNORMAL HIGH (ref 70–99)
Potassium: 3.8 mmol/L (ref 3.5–5.1)
Sodium: 131 mmol/L — ABNORMAL LOW (ref 135–145)
Total Bilirubin: 0.2 mg/dL — ABNORMAL LOW (ref 0.3–1.2)
Total Protein: 6.3 g/dL — ABNORMAL LOW (ref 6.5–8.1)

## 2021-07-28 LAB — VITAMIN B12: Vitamin B-12: 471 pg/mL (ref 180–914)

## 2021-07-28 LAB — FERRITIN: Ferritin: 158 ng/mL (ref 11–307)

## 2021-07-28 LAB — RETICULOCYTES
Immature Retic Fract: 15.1 % (ref 2.3–15.9)
RBC.: 3.4 MIL/uL — ABNORMAL LOW (ref 3.87–5.11)
Retic Count, Absolute: 71.7 10*3/uL (ref 19.0–186.0)
Retic Ct Pct: 2.1 % (ref 0.4–3.1)

## 2021-07-28 LAB — TSH: TSH: 3.477 u[IU]/mL (ref 0.350–4.500)

## 2021-07-29 LAB — HAPTOGLOBIN: Haptoglobin: 228 mg/dL (ref 41–333)

## 2021-08-02 LAB — MULTIPLE MYELOMA PANEL, SERUM
Albumin SerPl Elph-Mcnc: 3 g/dL (ref 2.9–4.4)
Albumin/Glob SerPl: 1.3 (ref 0.7–1.7)
Alpha 1: 0.4 g/dL (ref 0.0–0.4)
Alpha2 Glob SerPl Elph-Mcnc: 0.8 g/dL (ref 0.4–1.0)
B-Globulin SerPl Elph-Mcnc: 0.8 g/dL (ref 0.7–1.3)
Gamma Glob SerPl Elph-Mcnc: 0.5 g/dL (ref 0.4–1.8)
Globulin, Total: 2.5 g/dL (ref 2.2–3.9)
IgA: 126 mg/dL (ref 64–422)
IgG (Immunoglobin G), Serum: 506 mg/dL — ABNORMAL LOW (ref 586–1602)
IgM (Immunoglobulin M), Srm: 71 mg/dL (ref 26–217)
M Protein SerPl Elph-Mcnc: 0.1 g/dL — ABNORMAL HIGH
Total Protein ELP: 5.5 g/dL — ABNORMAL LOW (ref 6.0–8.5)

## 2021-08-03 LAB — COMP PANEL: LEUKEMIA/LYMPHOMA: Immunophenotypic Profile: 0

## 2021-08-12 ENCOUNTER — Encounter: Payer: Self-pay | Admitting: Family

## 2021-08-12 ENCOUNTER — Ambulatory Visit: Payer: Medicare Other | Attending: Family | Admitting: Family

## 2021-08-12 VITALS — BP 101/70 | HR 111 | Resp 16 | Ht 61.0 in | Wt 129.4 lb

## 2021-08-12 DIAGNOSIS — E785 Hyperlipidemia, unspecified: Secondary | ICD-10-CM | POA: Insufficient documentation

## 2021-08-12 DIAGNOSIS — I4891 Unspecified atrial fibrillation: Secondary | ICD-10-CM | POA: Insufficient documentation

## 2021-08-12 DIAGNOSIS — M810 Age-related osteoporosis without current pathological fracture: Secondary | ICD-10-CM | POA: Insufficient documentation

## 2021-08-12 DIAGNOSIS — I10 Essential (primary) hypertension: Secondary | ICD-10-CM | POA: Diagnosis not present

## 2021-08-12 DIAGNOSIS — Z7901 Long term (current) use of anticoagulants: Secondary | ICD-10-CM | POA: Diagnosis not present

## 2021-08-12 DIAGNOSIS — I130 Hypertensive heart and chronic kidney disease with heart failure and stage 1 through stage 4 chronic kidney disease, or unspecified chronic kidney disease: Secondary | ICD-10-CM | POA: Diagnosis present

## 2021-08-12 DIAGNOSIS — I482 Chronic atrial fibrillation, unspecified: Secondary | ICD-10-CM | POA: Diagnosis not present

## 2021-08-12 DIAGNOSIS — Z79899 Other long term (current) drug therapy: Secondary | ICD-10-CM | POA: Diagnosis not present

## 2021-08-12 DIAGNOSIS — I5032 Chronic diastolic (congestive) heart failure: Secondary | ICD-10-CM | POA: Insufficient documentation

## 2021-08-12 DIAGNOSIS — M109 Gout, unspecified: Secondary | ICD-10-CM | POA: Insufficient documentation

## 2021-08-12 DIAGNOSIS — I2510 Atherosclerotic heart disease of native coronary artery without angina pectoris: Secondary | ICD-10-CM | POA: Insufficient documentation

## 2021-08-12 DIAGNOSIS — K219 Gastro-esophageal reflux disease without esophagitis: Secondary | ICD-10-CM | POA: Diagnosis not present

## 2021-08-12 DIAGNOSIS — J90 Pleural effusion, not elsewhere classified: Secondary | ICD-10-CM | POA: Insufficient documentation

## 2021-08-12 MED ORDER — SACUBITRIL-VALSARTAN 24-26 MG PO TABS: 1.0000 | ORAL_TABLET | Freq: Two times a day (BID) | ORAL | 3 refills | Status: DC

## 2021-08-12 NOTE — Patient Instructions (Addendum)
Begin weighing daily and call for an overnight weight gain of 3 pounds or more or a weekly weight gain of more than 5 pounds.   If you have voicemail, please make sure your mailbox is cleaned out so that we may leave a message and please make sure to listen to any voicemails.    Call us in the future if you need us for anything 

## 2021-08-12 NOTE — Progress Notes (Signed)
? Patient ID: Allison Novak, female    DOB: May 02, 1926, 88 y.o.   MRN: [id number] ? ?HPI ? ?Ms Dunsworth is a 88 y/o female with a history of CAD, HTN, atrial fibrillation, anemia, CKD, hiatal hernia, PE, GERD, hyperlipidemia, gout, diverticulosis, osteoporosis and chronic heart failure.  ? ?Echo report from 07/18/21 reviewed and showed an EF of 55-60% along with mild LAE, moderate MR, moderate/severe TR and moderately elevated PA pressure. Echo report from 02/16/20 reviewed and showed an EF of 45-50% along with mild/moderate MR/TR.  ? ?Admitted 07/17/21 due to shortness of breath. Initially given IV lasix with transition to oral diuretics. Thoracentesis X 2 completed. Hypoxic on room air so 1L oxygen given at discharged. PT/OT/ speech evaluations done. Discharged after 9 days. Admitted 06/07/21 due to shortness of breath/ fatigue due to HF exacerbation. Initially given IV lasix with transition to oral diuretics. Cardiology consult obtained. Hyponatremia improved with diuresis. OT evaluation done. Discharged after 3 days.  ? ?She presents today for a follow-up visit with a chief complaint of minimal fatigue upon moderate exertion. Describes this as chronic in nature. She has no other symptoms and specifically denies any dizziness, difficulty sleeping, cough, shortness of breath, chest pain, pedal edema, palpitations, abdominal distention or weight gain.  ? ?No longer wearing oxygen. Son says that she will be having an overnight oximetry sometime soon.  ? ?Past Medical History:  ?Diagnosis Date  ? A-fib ([place])   ? Anemia   ? Carotid artery stenosis   ? CHF (congestive heart failure) ([place])   ? Chronic kidney disease   ? Coronary artery disease   ? Diverticulosis   ? GERD (gastroesophageal reflux disease)   ? Gout   ? Hiatal hernia   ? Hyperlipidemia   ? Hypertension   ? Osteoporosis   ? Pulmonary embolus ([place])   ? ?Past Surgical History:  ?Procedure Laterality Date  ? ABDOMINAL HYSTERECTOMY    ? ANKLE SURGERY Left 2011  ?  KNEE ARTHROSCOPY Right   ? ?Family History  ?Problem Relation Age of Onset  ? CAD Mother   ? Hypertension Sister   ? COPD Brother   ? ?Social History  ? ?Tobacco Use  ? Smoking status: Never  ? Smokeless tobacco: Never  ?Substance Use Topics  ? Alcohol use: No  ?  Alcohol/week: 0.0 standard drinks  ? ?Allergies  ?Allergen Reactions  ? Keflex [Cephalexin] Other (See Comments)  ?  Pt cant remember exact reaction  ? Macrodantin [Nitrofurantoin Macrocrystal] Other (See Comments)  ? ?Prior to Admission medications   ?Medication Sig Start Date End Date Taking? Authorizing Provider  ?allopurinol (ZYLOPRIM) 100 MG tablet Take 100 mg by mouth daily.   Yes [provider]  ?atorvastatin (LIPITOR) 40 MG tablet Take 40 mg by mouth daily. 11/18/16  Yes [provider]  ?azelastine (ASTELIN) 0.1 % nasal spray Place 1 spray into both nostrils daily. 06/15/20  Yes [provider]  ?calcium carbonate (OSCAL) 1500 (600 Ca) MG TABS tablet Take 1 tablet by mouth in the morning and at bedtime.   Yes [provider]  ?Cholecalciferol 50 MCG (2000 UT) CAPS Take 2,000 Units by mouth daily.   Yes [provider]  ?ELIQUIS 2.5 MG TABS tablet Take 2.5 mg by mouth 2 (two) times daily. 02/13/20  Yes [provider]  ?levothyroxine (SYNTHROID) 25 MCG tablet Take 25 mcg by mouth daily. 07/14/21  Yes [provider]  ?metoprolol succinate (TOPROL-XL) 50 MG 24 hr tablet Take  1 tablet (50 mg total) by mouth daily. Take with or immediately following a meal. 06/11/21  Yes Wouk, Ailene Rud, MD  ?omeprazole (PRILOSEC) 40 MG capsule Take 40 mg by mouth daily.    Yes [provider]  ?potassium chloride SA (KLOR-CON M) 20 MEQ tablet Take 1 tablet (20 mEq total) by mouth 2 (two) times daily. 07/25/21  Yes Wieting, Richard, MD  ?torsemide (DEMADEX) 20 MG tablet Take 1 tablet (20 mg total) by mouth 2 (two) times daily. 07/24/21  Yes Loletha Grayer, MD  ? ? ?Review of Systems  ?Constitutional:   Positive for fatigue. Negative for appetite change.  ?HENT:  Negative for congestion, postnasal drip and sore throat.   ?Eyes: Negative.   ?Respiratory:  Negative for cough, chest tightness and shortness of breath.   ?Cardiovascular:  Negative for chest pain, palpitations and leg swelling.  ?Gastrointestinal:  Negative for abdominal distention and abdominal pain.  ?Endocrine: Negative.   ?Genitourinary: Negative.   ?Musculoskeletal:  Negative for back pain and neck pain.  ?Skin: Negative.   ?Allergic/Immunologic: Negative.   ?Neurological:  Negative for dizziness and light-headedness.  ?Hematological:  Negative for adenopathy. Does not bruise/bleed easily.  ?Psychiatric/Behavioral:  Negative for dysphoric mood and sleep disturbance (sleeping on 1 pillow). The patient is not nervous/anxious.   ?     "Memory fog"  ? ?Vitals:  ? 08/12/21 1251  ?BP: 101/70  ?Pulse: (!) 111  ?Resp: 16  ?SpO2: 91%  ?Weight: 129 lb 6 oz (58.7 kg)  ?Height: '5\' 1"'$  (1.549 m)  ? ?Wt Readings from Last 3 Encounters:  ?08/12/21 129 lb 6 oz (58.7 kg)  ?07/28/21 133 lb 6.4 oz (60.5 kg)  ?07/26/21 131 lb 8 oz (59.6 kg)  ? ?Lab Results  ?Component Value Date  ? CREATININE 1.20 (H) 07/28/2021  ? CREATININE 1.00 07/26/2021  ? CREATININE 1.16 (H) 07/25/2021  ? ?Physical Exam ?Vitals and nursing note reviewed. Exam conducted with a chaperone present (son).  ?Constitutional:   ?   Appearance: Normal appearance.  ?HENT:  ?   Head: Normocephalic and atraumatic.  ?Cardiovascular:  ?   Rate and Rhythm: Tachycardia present. Rhythm irregular.  ?Pulmonary:  ?   Effort: Pulmonary effort is normal. No respiratory distress.  ?   Breath sounds: No wheezing or rales.  ?Abdominal:  ?   General: There is no distension.  ?   Palpations: Abdomen is soft.  ?   Tenderness: There is no abdominal tenderness.  ?Musculoskeletal:     ?   General: No tenderness.  ?   Cervical back: Normal range of motion and neck supple.  ?   Right lower leg: Edema (trace pitting) present.  ?    Left lower leg: Edema (trace pitting) present.  ?Skin: ?   General: Skin is warm and dry.  ?Neurological:  ?   General: No focal deficit present.  ?   Mental Status: She is alert and oriented to person, place, and time.  ?Psychiatric:     ?   Mood and Affect: Mood normal.     ?   Behavior: Behavior normal.     ?   Thought Content: Thought content normal.  ? ?Assessment & Plan: ? ?1: Chronic heart failure with now preserved ejection fraction with structural changes- ?- NYHA class II ?- euvolemic today ?- weighing daily; reminded to call for an overnight weight gain of > 2 pounds or a weekly weight gain of > 5 pounds ?- weight up 4 pounds  since she was last here 2 months ago ?- adding "some" salt but is trying to not add any;  ?- on GDMT of metoprolol ?- current BP will not allow for entresto, SGLT2 or MRA ?- wearing compression socks daily with removal at bedtime ?- BNP 06/07/21 was 585.5 ? ?2: HTN- ?- BP looks good (101/70) ?- saw PCP Caryl Comes) 08/10/21 ?- BMP 07/26/21 reviewed and showed sodium 133, potassium 3.9, creatinine 1.00 and GFR 53 ? ?3: Atrial fibrillation- ?- currently on apixaban & metoprolol ? ?4: Pleural effusion (? malignant)- ?- saw pulmonology Lanney Gins) 08/09/21 ?- saw oncology Janese Banks) 07/28/21 ? ? ?Med list reviewed.  ? ?Due to HF stability and numerous other appointments, will not schedule another appointment at this time. Advised patient and her son that they could call back at anytime for questions or to schedule another appointment and they were comfortable with this plan.  ? ? ?

## 2021-08-13 ENCOUNTER — Ambulatory Visit (HOSPITAL_COMMUNITY)
Admission: RE | Admit: 2021-08-13 | Discharge: 2021-08-13 | Disposition: A | Discharge status: Home / Self Care | Payer: Medicare Other | Source: Ambulatory Visit | Attending: Oncology | Admitting: Oncology

## 2021-08-13 DIAGNOSIS — I700 Atherosclerosis of aorta: Secondary | ICD-10-CM | POA: Diagnosis not present

## 2021-08-13 DIAGNOSIS — I2510 Atherosclerotic heart disease of native coronary artery without angina pectoris: Secondary | ICD-10-CM | POA: Insufficient documentation

## 2021-08-13 DIAGNOSIS — C8337 Diffuse large B-cell lymphoma, spleen: Secondary | ICD-10-CM

## 2021-08-13 DIAGNOSIS — J439 Emphysema, unspecified: Secondary | ICD-10-CM | POA: Diagnosis not present

## 2021-08-13 LAB — GLUCOSE, CAPILLARY: Glucose-Capillary: 83 mg/dL (ref 70–99)

## 2021-08-13 MED ORDER — FLUDEOXYGLUCOSE F - 18 (FDG) INJECTION
6.3900 | Freq: Once | INTRAVENOUS | Status: AC
Start: 2021-08-13 — End: 2021-08-13
  Administered 2021-08-13: 6.39 via INTRAVENOUS

## 2021-08-15 ENCOUNTER — Encounter: Payer: Self-pay | Admitting: Oncology

## 2021-08-15 NOTE — Progress Notes (Signed)
? ?Hematology/Oncology Consult note ?[place] ?[phone]) [phone] [phone] ? ?Patient Care Team: ?Adin Hector, MD as PCP - General (Internal Medicine)  ? ?Name of the patient: Allison Novak  ?[id number]  ?[birth year]  ? ? ?Reason for referral-finding of monoclonal B cells in pleural fluid ?  ?Referring physician-Dr. Marquis Lunch ? ?Date of visit: 08/15/21 ? ? ?History of presenting illness- Patient is a 88 year old female with a past medical history significant for atrial fibrillation, stage IIIa CKD, TIA, hyperlipidemia, moderate pulmonary hypertension, diastolic CHF who was admitted to the hospital in March 2023 with symptoms of shortness of breath.  She was treated for CHF with IV Lasix and also underwent thoracentesis on 07/23/2021 when 400 cc of fluid was drained.  Flow cytometry on the pleural fluid showed partial CD5 positive monoclonal B-cell population but the phenotype was nonspecific.  Differential diagnosis includes atypical CLL, SLL, mantle cell lymphoma and other mature B-cell lymphomas.  Frequent red cells present on smear and a possible peripheral blood contamination cannot be excluded.  Patient has been referred for further management. ? ?Patient continues to experience chronic fatigue and shortness of breath on exertion. ? ?ECOG PS- 2-3 ? ?Pain scale- 0 ? ? ?Review of systems- Review of Systems  ?Constitutional:  Positive for malaise/fatigue. Negative for chills, fever and weight loss.  ?HENT:  Negative for congestion, ear discharge and nosebleeds.   ?Eyes:  Negative for blurred vision.  ?Respiratory:  Positive for shortness of breath. Negative for cough, hemoptysis, sputum production and wheezing.   ?Cardiovascular:  Negative for chest pain, palpitations, orthopnea and claudication.  ?Gastrointestinal:  Negative for abdominal pain, blood in stool, constipation, diarrhea, heartburn, melena, nausea and vomiting.  ?Genitourinary:  Negative for dysuria,  flank pain, frequency, hematuria and urgency.  ?Musculoskeletal:  Negative for back pain, joint pain and myalgias.  ?Skin:  Negative for rash.  ?Neurological:  Negative for dizziness, tingling, focal weakness, seizures, weakness and headaches.  ?Endo/Heme/Allergies:  Does not bruise/bleed easily.  ?Psychiatric/Behavioral:  Negative for depression and suicidal ideas. The patient does not have insomnia.   ? ?Allergies  ?Allergen Reactions  ? Keflex [Cephalexin] Other (See Comments)  ?  Pt cant remember exact reaction  ? Macrodantin [Nitrofurantoin Macrocrystal] Other (See Comments)  ? ? ?Patient Active Problem List  ? Diagnosis Date Noted  ? Iron deficiency anemia 07/21/2021  ? Moderate pulmonary hypertension ([place]) 07/20/2021  ? Sepsis ([place]) 07/19/2021  ? Thrombocytopenia ([place]) 07/19/2021  ? Acute urinary retention 07/18/2021  ? History of pulmonary embolism 07/18/2021  ? Hypothermia 07/18/2021  ? Pleural effusion on right 07/18/2021  ? Acute on chronic diastolic CHF (congestive heart failure) ([place]) 07/17/2021  ? HLD (hyperlipidemia) 07/17/2021  ? Pulmonary embolism ([place]) 07/17/2021  ? Chronic kidney disease, stage 3a ([place]) 07/17/2021  ? Hypothyroidism 07/17/2021  ? Acute respiratory failure with hypoxia ([place]) 07/17/2021  ? Anemia of chronic disease 06/07/2021  ? Permanent atrial fibrillation ([place]) 06/07/2021  ? Hyponatremia 06/07/2021  ? Acute on chronic combined systolic and diastolic CHF (congestive heart failure) ([place]) 06/07/2021  ? Atrial fibrillation, chronic ([place]) 08/04/2020  ? Acute exacerbation of CHF (congestive heart failure) ([place]) 08/04/2020  ? Shortness of breath 08/03/2020  ? Hypokalemia   ? Palpitations   ? Hyperlipidemia 02/16/2020  ? Atrial fibrillation with RVR ([place]) 02/16/2020  ? Hypoxia 02/16/2020  ? Acute CHF (congestive heart failure) ([place]) 02/16/2020  ? Chronic anticoagulation 02/16/2020  ? History of TIA (transient ischemic attack) 02/23/2017  ? Carotid artery  disease ([place]) 02/23/2017  ? TIA  (transient ischemic attack) 02/18/2017  ? GERD (gastroesophageal reflux disease) 02/18/2017  ? Chest pain 06/23/2015  ? Essential hypertension 06/16/2015  ? Aortic atherosclerosis ([place]) 06/16/2015  ? ? ? ?Past Medical History:  ?Diagnosis Date  ? A-fib ([place])   ? Anemia   ? Carotid artery stenosis   ? CHF (congestive heart failure) ([place])   ? Chronic kidney disease   ? Coronary artery disease   ? Diverticulosis   ? GERD (gastroesophageal reflux disease)   ? Gout   ? Hiatal hernia   ? Hyperlipidemia   ? Hypertension   ? Osteoporosis   ? Pulmonary embolus ([place])   ? ? ? ?Past Surgical History:  ?Procedure Laterality Date  ? ABDOMINAL HYSTERECTOMY    ? ANKLE SURGERY Left 2011  ? KNEE ARTHROSCOPY Right   ? ? ?Social History  ? ?Socioeconomic History  ? Marital status: Widowed  ?  Spouse name: Not on file  ? Number of children: Not on file  ? Years of education: Not on file  ? Highest education level: Not on file  ?Occupational History  ? Not on file  ?Tobacco Use  ? Smoking status: Never  ? Smokeless tobacco: Never  ?Substance and Sexual Activity  ? Alcohol use: No  ?  Alcohol/week: 0.0 standard drinks  ? Drug use: No  ? Sexual activity: Never  ?Other Topics Concern  ? Not on file  ?Social History Narrative  ? Not on file  ? ?Social Determinants of Health  ? ?Financial Resource Strain: Not on file  ?Food Insecurity: Not on file  ?Transportation Needs: Not on file  ?Physical Activity: Not on file  ?Stress: Not on file  ?Social Connections: Not on file  ?Intimate Partner Violence: Not on file  ? ?  ?Family History  ?Problem Relation Age of Onset  ? CAD Mother   ? Hypertension Sister   ? COPD Brother   ? ? ? ?Current Outpatient Medications:  ?  allopurinol (ZYLOPRIM) 100 MG tablet, Take 100 mg by mouth daily., Disp: , Rfl:  ?  atorvastatin (LIPITOR) 40 MG tablet, Take 40 mg by mouth daily., Disp: , Rfl: 2 ?  azelastine (ASTELIN) 0.1 % nasal spray, Place 1 spray into both nostrils daily., Disp: , Rfl:  ?  calcium carbonate  (OSCAL) 1500 (600 Ca) MG TABS tablet, Take 1 tablet by mouth in the morning and at bedtime., Disp: , Rfl:  ?  Cholecalciferol 50 MCG (2000 UT) CAPS, Take 2,000 Units by mouth daily., Disp: , Rfl:  ?  ELIQUIS 2.5 MG TABS tablet, Take 2.5 mg by mouth 2 (two) times daily., Disp: , Rfl:  ?  levothyroxine (SYNTHROID) 25 MCG tablet, Take 25 mcg by mouth daily., Disp: , Rfl:  ?  metoprolol succinate (TOPROL-XL) 50 MG 24 hr tablet, Take 1 tablet (50 mg total) by mouth daily. Take with or immediately following a meal., Disp: 30 tablet, Rfl: 1 ?  omeprazole (PRILOSEC) 40 MG capsule, Take 40 mg by mouth daily. , Disp: , Rfl:  ?  potassium chloride SA (KLOR-CON M) 20 MEQ tablet, Take 1 tablet (20 mEq total) by mouth 2 (two) times daily., Disp: 60 tablet, Rfl: 0 ?  torsemide (DEMADEX) 20 MG tablet, Take 1 tablet (20 mg total) by mouth 2 (two) times daily., Disp: 60 tablet, Rfl: 0 ? ? ?Physical exam:  ?Vitals:  ? 07/28/21 1312  ?BP: (!) 99/50  ?Pulse: 86  ?Resp: 16  ?Temp:  98.2 ?F (36.8 ?C)  ?TempSrc: Oral  ?Weight: 133 lb 6.4 oz (60.5 kg)  ?Height: '5\' 2"'$  (1.575 m)  ? ?Physical Exam ?Constitutional:   ?   Comments: Sitting in a wheelchair.  Appears fatigued  ?Eyes:  ?   Pupils: Pupils are equal, round, and reactive to light.  ?Cardiovascular:  ?   Rate and Rhythm: Normal rate and regular rhythm.  ?   Heart sounds: Normal heart sounds.  ?Pulmonary:  ?   Effort: Pulmonary effort is normal.  ?   Breath sounds: Normal breath sounds.  ?Abdominal:  ?   General: Bowel sounds are normal.  ?   Palpations: Abdomen is soft.  ?   Comments: No palpable hepatosplenomegaly  ?Musculoskeletal:  ?   Cervical back: Normal range of motion.  ?Lymphadenopathy:  ?   Comments: Patient unable to set up on examination table.No palpable cervical, supraclavicular, axillary or inguinal adenopathy ? ?  ?Skin: ?   General: Skin is warm and dry.  ?Neurological:  ?   Mental Status: She is alert and oriented to person, place, and time.  ?  ? ? ? ? ?  Latest Ref  Rng & Units 07/28/2021  ?  2:24 PM  ?CMP  ?Glucose 70 - 99 mg/dL 116    ?BUN 8 - 23 mg/dL 31    ?Creatinine 0.44 - 1.00 mg/dL 1.20    ?Sodium 135 - 145 mmol/L 131    ?Potassium 3.5 - 5.1 mmol/L 3.8    ?Chloride

## 2021-08-17 ENCOUNTER — Encounter: Payer: Self-pay | Admitting: Oncology

## 2021-08-17 ENCOUNTER — Ambulatory Visit: Payer: Medicare Other | Admitting: Oncology

## 2021-08-17 ENCOUNTER — Inpatient Hospital Stay: Payer: Medicare Other | Attending: Oncology | Admitting: Oncology

## 2021-08-17 VITALS — BP 100/68 | HR 118 | Resp 16 | Wt 132.1 lb

## 2021-08-17 DIAGNOSIS — I272 Pulmonary hypertension, unspecified: Secondary | ICD-10-CM | POA: Diagnosis not present

## 2021-08-17 DIAGNOSIS — D72820 Lymphocytosis (symptomatic): Secondary | ICD-10-CM | POA: Insufficient documentation

## 2021-08-17 DIAGNOSIS — Z8673 Personal history of transient ischemic attack (TIA), and cerebral infarction without residual deficits: Secondary | ICD-10-CM | POA: Insufficient documentation

## 2021-08-17 DIAGNOSIS — Z7901 Long term (current) use of anticoagulants: Secondary | ICD-10-CM | POA: Diagnosis not present

## 2021-08-17 DIAGNOSIS — N1831 Chronic kidney disease, stage 3a: Secondary | ICD-10-CM | POA: Insufficient documentation

## 2021-08-17 DIAGNOSIS — E785 Hyperlipidemia, unspecified: Secondary | ICD-10-CM | POA: Diagnosis not present

## 2021-08-17 DIAGNOSIS — Z86711 Personal history of pulmonary embolism: Secondary | ICD-10-CM | POA: Insufficient documentation

## 2021-08-17 DIAGNOSIS — I5032 Chronic diastolic (congestive) heart failure: Secondary | ICD-10-CM | POA: Insufficient documentation

## 2021-08-17 DIAGNOSIS — I4891 Unspecified atrial fibrillation: Secondary | ICD-10-CM | POA: Diagnosis not present

## 2021-08-17 DIAGNOSIS — Z79899 Other long term (current) drug therapy: Secondary | ICD-10-CM | POA: Insufficient documentation

## 2021-08-17 DIAGNOSIS — I130 Hypertensive heart and chronic kidney disease with heart failure and stage 1 through stage 4 chronic kidney disease, or unspecified chronic kidney disease: Secondary | ICD-10-CM | POA: Insufficient documentation

## 2021-08-17 NOTE — Progress Notes (Signed)
Per son: per pulmonary they have scehduled a "oxygen studies" to monitor pts oxygen levels throughout the night.  ?

## 2021-08-26 NOTE — Progress Notes (Signed)
? ? ? ?Hematology/Oncology Consult note ?[place]  ?Telephone:([phone]) [phone] [phone] ? ?Patient Care Team: ?Adin Hector, MD as PCP - General (Internal Medicine)  ? ?Name of the patient: Allison Novak  ?[id number]  ?04-05-1926  ? ?Date of visit: 08/26/21 ? ?Diagnosis- monoclonal B cell lymphocytosis ? ?Chief complaint/ Reason for visit- discuss pet scan and blood work results ? ?Heme/Onc history: Patient is a 88 year old female with a past medical history significant for atrial fibrillation, stage IIIa CKD, TIA, hyperlipidemia, moderate pulmonary hypertension, diastolic CHF who was admitted to the hospital in March 2023 with symptoms of shortness of breath.  She was treated for CHF with IV Lasix and also underwent thoracentesis on 07/23/2021 when 400 cc of fluid was drained.  Flow cytometry on the pleural fluid showed partial CD5 positive monoclonal B-cell population but the phenotype was nonspecific.  Differential diagnosis includes atypical CLL, SLL, mantle cell lymphoma and other mature B-cell lymphomas.  Frequent red cells present on smear and a possible peripheral blood contamination cannot be excluded.   ? ?Blood work from 07/28/2021 showed white cell count of 7 with a normal differential, H&H of 9.1/29.6 with an MCV of 88 and a platelet count of 226.  CMP showed mildly elevated creatinine of 1.2 B12 and folate were normal.  Ferritin normal at 158.  Reticulocyte count low for the degree of anemia at 2.1%.  Myeloma panel showed 0.1 g IgG kappa monoclonal protein.  Haptoglobin and TSH normal.  Flow cytometry in the peripheral blood showed atypical CD5 positive B-cell population representing less than 1% of the leukocytes. ? ?Interval history-patient is here with her son today.  She has baseline exertional shortness of breath which is overall stable. ? ?ECOG PS- 2 ?Pain scale- 0 ? ? ?Review of systems- Review of Systems  ?Constitutional:  Positive for malaise/fatigue. Negative  for chills, fever and weight loss.  ?HENT:  Negative for congestion, ear discharge and nosebleeds.   ?Eyes:  Negative for blurred vision.  ?Respiratory:  Positive for shortness of breath. Negative for cough, hemoptysis, sputum production and wheezing.   ?Cardiovascular:  Negative for chest pain, palpitations, orthopnea and claudication.  ?Gastrointestinal:  Negative for abdominal pain, blood in stool, constipation, diarrhea, heartburn, melena, nausea and vomiting.  ?Genitourinary:  Negative for dysuria, flank pain, frequency, hematuria and urgency.  ?Musculoskeletal:  Negative for back pain, joint pain and myalgias.  ?Skin:  Negative for rash.  ?Neurological:  Negative for dizziness, tingling, focal weakness, seizures, weakness and headaches.  ?Endo/Heme/Allergies:  Does not bruise/bleed easily.  ?Psychiatric/Behavioral:  Negative for depression and suicidal ideas. The patient does not have insomnia.    ? ? ?Allergies  ?Allergen Reactions  ? Keflex [Cephalexin] Other (See Comments)  ?  Pt cant remember exact reaction  ? Macrodantin [Nitrofurantoin Macrocrystal] Other (See Comments)  ? ? ? ?Past Medical History:  ?Diagnosis Date  ? A-fib ([place])   ? Anemia   ? Carotid artery stenosis   ? CHF (congestive heart failure) ([place])   ? Chronic kidney disease   ? Coronary artery disease   ? Diverticulosis   ? GERD (gastroesophageal reflux disease)   ? Gout   ? Hiatal hernia   ? Hyperlipidemia   ? Hypertension   ? Osteoporosis   ? Pulmonary embolus ([place])   ? ? ? ?Past Surgical History:  ?Procedure Laterality Date  ? ABDOMINAL HYSTERECTOMY    ? ANKLE SURGERY Left 2011  ? KNEE ARTHROSCOPY Right   ? ? ?  Social History  ? ?Socioeconomic History  ? Marital status: Widowed  ?  Spouse name: Not on file  ? Number of children: Not on file  ? Years of education: Not on file  ? Highest education level: Not on file  ?Occupational History  ? Not on file  ?Tobacco Use  ? Smoking status: Never  ? Smokeless tobacco: Never  ?Substance and Sexual  Activity  ? Alcohol use: No  ?  Alcohol/week: 0.0 standard drinks  ? Drug use: No  ? Sexual activity: Never  ?Other Topics Concern  ? Not on file  ?Social History Narrative  ? Not on file  ? ?Social Determinants of Health  ? ?Financial Resource Strain: Not on file  ?Food Insecurity: Not on file  ?Transportation Needs: Not on file  ?Physical Activity: Not on file  ?Stress: Not on file  ?Social Connections: Not on file  ?Intimate Partner Violence: Not on file  ? ? ?Family History  ?Problem Relation Age of Onset  ? CAD Mother   ? Hypertension Sister   ? COPD Brother   ? ? ? ?Current Outpatient Medications:  ?  albuterol (ACCUNEB) 1.25 MG/3ML nebulizer solution, Take 1 ampule by nebulization every 6 (six) hours as needed for wheezing., Disp: , Rfl:  ?  allopurinol (ZYLOPRIM) 100 MG tablet, Take 100 mg by mouth daily., Disp: , Rfl:  ?  atorvastatin (LIPITOR) 40 MG tablet, Take 40 mg by mouth daily., Disp: , Rfl: 2 ?  azelastine (ASTELIN) 0.1 % nasal spray, Place 1 spray into both nostrils daily., Disp: , Rfl:  ?  calcium carbonate (OSCAL) 1500 (600 Ca) MG TABS tablet, Take 1 tablet by mouth in the morning and at bedtime., Disp: , Rfl:  ?  Cholecalciferol 50 MCG (2000 UT) CAPS, Take 2,000 Units by mouth daily., Disp: , Rfl:  ?  ELIQUIS 2.5 MG TABS tablet, Take 2.5 mg by mouth 2 (two) times daily., Disp: , Rfl:  ?  metoprolol succinate (TOPROL-XL) 50 MG 24 hr tablet, Take 1 tablet (50 mg total) by mouth daily. Take with or immediately following a meal., Disp: 30 tablet, Rfl: 1 ?  omeprazole (PRILOSEC) 40 MG capsule, Take 40 mg by mouth daily. , Disp: , Rfl:  ?  potassium chloride SA (KLOR-CON M) 20 MEQ tablet, Take 1 tablet (20 mEq total) by mouth 2 (two) times daily., Disp: 60 tablet, Rfl: 0 ?  torsemide (DEMADEX) 20 MG tablet, Take 1 tablet (20 mg total) by mouth 2 (two) times daily., Disp: 60 tablet, Rfl: 0 ?  levothyroxine (SYNTHROID) 25 MCG tablet, Take 25 mcg by mouth daily. (Patient not taking: Reported on  08/17/2021), Disp: , Rfl:  ? ?Physical exam:  ?Vitals:  ? 08/17/21 1338  ?BP: 100/68  ?Pulse: (!) 118  ?Resp: 16  ?Weight: 132 lb 1.6 oz (59.9 kg)  ? ?Physical Exam ?Constitutional:   ?   Comments: Sitting in a wheelchair and appears in no acute distress  ?Cardiovascular:  ?   Rate and Rhythm: Normal rate and regular rhythm.  ?   Heart sounds: Normal heart sounds.  ?Pulmonary:  ?   Effort: Pulmonary effort is normal.  ?   Breath sounds: Normal breath sounds.  ?Abdominal:  ?   General: Bowel sounds are normal.  ?   Palpations: Abdomen is soft.  ?Musculoskeletal:  ?   Comments: Trace bilateral edema  ?Skin: ?   General: Skin is warm and dry.  ?Neurological:  ?   Mental Status: She  is alert and oriented to person, place, and time.  ?  ? ? ?  Latest Ref Rng & Units 07/28/2021  ?  2:24 PM  ?CMP  ?Glucose 70 - 99 mg/dL 116    ?BUN 8 - 23 mg/dL 31    ?Creatinine 0.44 - 1.00 mg/dL 1.20    ?Sodium 135 - 145 mmol/L 131    ?Potassium 3.5 - 5.1 mmol/L 3.8    ?Chloride 98 - 111 mmol/L 93    ?CO2 22 - 32 mmol/L 29    ?Calcium 8.9 - 10.3 mg/dL 8.6    ?Total Protein 6.5 - 8.1 g/dL 6.3    ?Total Bilirubin 0.3 - 1.2 mg/dL 0.2    ?Alkaline Phos 38 - 126 U/L 85    ?AST 15 - 41 U/L 29    ?ALT 0 - 44 U/L 19    ? ? ?  Latest Ref Rng & Units 07/28/2021  ?  2:24 PM  ?CBC  ?WBC 4.0 - 10.5 K/uL 7.0    ?Hemoglobin 12.0 - 15.0 g/dL 9.1    ?Hematocrit 36.0 - 46.0 % 29.6    ?Platelets 150 - 400 K/uL 226    ? ? ?No images are attached to the encounter. ? ?NM PET Image Initial (PI) Skull Base To Thigh ? 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 Abnormal cytology on right-sided thoracentesis 07/23/2021. EXAM: NUCLEAR MEDICINE PET SKULL BASE TO THIGH TECHNIQUE: 6.39 mCi F-18 FDG was injected intravenously. Full-ring PET imaging was performed from the skull base to thigh after the radiotracer. CT data was obtained and used for attenuation correction and anatomic localization. Fasting blood glucose: 83  mg/dl COMPARISON:  Chest CT 06/07/2019.  Chest radiographs 07/23/2021. FINDINGS: Mediastinal blood pool activity: SUV max 2.0 Liver activity: SUV max 3.0 NECK: No hypermetabolic cervical lymph nodes are identif

## 2021-08-31 ENCOUNTER — Encounter: Payer: Self-pay | Admitting: Oncology

## 2021-09-02 NOTE — Telephone Encounter (Signed)
Pts son is aware of appointment on 09/06/21 at 1:15pm with Dr. Janese Banks. ?

## 2021-09-06 ENCOUNTER — Encounter: Payer: Self-pay | Admitting: Oncology

## 2021-09-06 ENCOUNTER — Other Ambulatory Visit: Payer: Self-pay | Admitting: *Deleted

## 2021-09-06 ENCOUNTER — Inpatient Hospital Stay: Payer: Medicare Other | Attending: Oncology | Admitting: Oncology

## 2021-09-06 ENCOUNTER — Inpatient Hospital Stay: Payer: Medicare Other

## 2021-09-06 VITALS — BP 118/84 | HR 114 | Wt 131.0 lb

## 2021-09-06 DIAGNOSIS — D631 Anemia in chronic kidney disease: Secondary | ICD-10-CM | POA: Diagnosis present

## 2021-09-06 DIAGNOSIS — Z79899 Other long term (current) drug therapy: Secondary | ICD-10-CM | POA: Diagnosis not present

## 2021-09-06 DIAGNOSIS — D72820 Lymphocytosis (symptomatic): Secondary | ICD-10-CM | POA: Diagnosis not present

## 2021-09-06 DIAGNOSIS — D649 Anemia, unspecified: Secondary | ICD-10-CM | POA: Diagnosis not present

## 2021-09-06 DIAGNOSIS — N1831 Chronic kidney disease, stage 3a: Secondary | ICD-10-CM | POA: Diagnosis present

## 2021-09-06 DIAGNOSIS — D638 Anemia in other chronic diseases classified elsewhere: Secondary | ICD-10-CM

## 2021-09-06 MED ORDER — EPOETIN ALFA-EPBX 40000 UNIT/ML IJ SOLN
40000.0000 [IU] | INTRAMUSCULAR | Status: AC
  Administered 2021-09-06: 40000 [IU] via SUBCUTANEOUS
  Filled 2021-09-06: qty 1

## 2021-09-06 NOTE — Progress Notes (Signed)
Pt states that she has noticed she has been more SOB lately.  ?

## 2021-09-09 ENCOUNTER — Encounter: Payer: Self-pay | Admitting: Oncology

## 2021-09-09 NOTE — Progress Notes (Signed)
? ? ? ?Hematology/Oncology Consult note ?[place]  ?[phone] [phone] ? ?Patient Care Team: ?Adin Hector, MD as PCP - General (Internal Medicine)  ? ?Name of the patient: Allison Novak  ?[id number]  ?1926-04-24  ? ?Date of visit: 09/09/21 ? ?Diagnosis-normocytic anemia of unclear etiology ? ?Chief complaint/ Reason for visit-routine follow-up of anemia ? ?Heme/Onc history: Patient is a 88 year old female with a past medical history significant for atrial fibrillation, stage IIIa CKD, TIA, hyperlipidemia, moderate pulmonary hypertension, diastolic CHF who was admitted to the hospital in March 2023 with symptoms of shortness of breath.  She was treated for CHF with IV Lasix and also underwent thoracentesis on 07/23/2021 when 400 cc of fluid was drained.  Flow cytometry on the pleural fluid showed partial CD5 positive monoclonal B-cell population but the phenotype was nonspecific.  Differential diagnosis includes atypical CLL, SLL, mantle cell lymphoma and other mature B-cell lymphomas.  Frequent red cells present on smear and a possible peripheral blood contamination cannot be excluded.   ?  ?Blood work from 07/28/2021 showed white cell count of 7 with a normal differential, H&H of 9.1/29.6 with an MCV of 88 and a platelet count of 226.  CMP showed mildly elevated creatinine of 1.2 B12 and folate were normal.  Ferritin normal at 158.  Reticulocyte count low for the degree of anemia at 2.1%.  Myeloma panel showed 0.1 g IgG kappa monoclonal protein.  Haptoglobin and TSH normal.  Flow cytometry in the peripheral blood showed atypical CD5 positive B-cell population representing less than 1% of the leukocytes. ?  ? ?Interval history-patient is here with her son.  She reports baseline fatigue.  She also has baseline exertional shortness of breath which has not worsened recently. ? ?ECOG PS- 2 ?Pain scale- 0 ? ? ?Review of systems- Review of Systems  ?Constitutional:   Positive for malaise/fatigue. Negative for chills, fever and weight loss.  ?HENT:  Negative for congestion, ear discharge and nosebleeds.   ?Eyes:  Negative for blurred vision.  ?Respiratory:  Negative for cough, hemoptysis, sputum production, shortness of breath and wheezing.   ?Cardiovascular:  Negative for chest pain, palpitations, orthopnea and claudication.  ?Gastrointestinal:  Negative for abdominal pain, blood in stool, constipation, diarrhea, heartburn, melena, nausea and vomiting.  ?Genitourinary:  Negative for dysuria, flank pain, frequency, hematuria and urgency.  ?Musculoskeletal:  Negative for back pain, joint pain and myalgias.  ?Skin:  Negative for rash.  ?Neurological:  Negative for dizziness, tingling, focal weakness, seizures, weakness and headaches.  ?Endo/Heme/Allergies:  Does not bruise/bleed easily.  ?Psychiatric/Behavioral:  Negative for depression and suicidal ideas. The patient does not have insomnia.    ? ? ? ?Allergies  ?Allergen Reactions  ? Keflex [Cephalexin] Other (See Comments)  ?  Pt cant remember exact reaction  ? Macrodantin [Nitrofurantoin Macrocrystal] Other (See Comments)  ? ? ? ?Past Medical History:  ?Diagnosis Date  ? A-fib ([place])   ? Anemia   ? Carotid artery stenosis   ? CHF (congestive heart failure) ([place])   ? Chronic kidney disease   ? Coronary artery disease   ? Diverticulosis   ? GERD (gastroesophageal reflux disease)   ? Gout   ? Hiatal hernia   ? Hyperlipidemia   ? Hypertension   ? Osteoporosis   ? Pulmonary embolus ([place])   ? ? ? ?Past Surgical History:  ?Procedure Laterality Date  ? ABDOMINAL HYSTERECTOMY    ? ANKLE SURGERY Left 2011  ? KNEE ARTHROSCOPY Right   ? ? ?  Social History  ? ?Socioeconomic History  ? Marital status: Widowed  ?  Spouse name: Not on file  ? Number of children: Not on file  ? Years of education: Not on file  ? Highest education level: Not on file  ?Occupational History  ? Not on file  ?Tobacco Use  ? Smoking status: Never  ? Smokeless tobacco:  Never  ?Substance and Sexual Activity  ? Alcohol use: No  ?  Alcohol/week: 0.0 standard drinks  ? Drug use: No  ? Sexual activity: Never  ?Other Topics Concern  ? Not on file  ?Social History Narrative  ? Not on file  ? ?Social Determinants of Health  ? ?Financial Resource Strain: Not on file  ?Food Insecurity: Not on file  ?Transportation Needs: Not on file  ?Physical Activity: Not on file  ?Stress: Not on file  ?Social Connections: Not on file  ?Intimate Partner Violence: Not on file  ? ? ?Family History  ?Problem Relation Age of Onset  ? CAD Mother   ? Hypertension Sister   ? COPD Brother   ? ? ? ?Current Outpatient Medications:  ?  allopurinol (ZYLOPRIM) 100 MG tablet, Take 100 mg by mouth daily., Disp: , Rfl:  ?  atorvastatin (LIPITOR) 40 MG tablet, Take 40 mg by mouth daily., Disp: , Rfl: 2 ?  calcium carbonate (OSCAL) 1500 (600 Ca) MG TABS tablet, Take 1 tablet by mouth in the morning and at bedtime., Disp: , Rfl:  ?  Cholecalciferol 50 MCG (2000 UT) CAPS, Take 2,000 Units by mouth daily., Disp: , Rfl:  ?  digoxin (LANOXIN) 0.125 MG tablet, Take by mouth., Disp: , Rfl:  ?  ELIQUIS 2.5 MG TABS tablet, Take 2.5 mg by mouth 2 (two) times daily., Disp: , Rfl:  ?  esomeprazole (NEXIUM) 40 MG capsule, Take by mouth., Disp: , Rfl:  ?  levothyroxine (SYNTHROID) 25 MCG tablet, Take 25 mcg by mouth daily., Disp: , Rfl:  ?  metoprolol succinate (TOPROL-XL) 50 MG 24 hr tablet, Take 1 tablet (50 mg total) by mouth daily. Take with or immediately following a meal., Disp: 30 tablet, Rfl: 1 ?  Multiple Vitamin (MULTI-VITAMIN) tablet, Take 1 tablet by mouth daily., Disp: , Rfl:  ?  potassium chloride SA (KLOR-CON M) 20 MEQ tablet, Take 1 tablet (20 mEq total) by mouth 2 (two) times daily., Disp: 60 tablet, Rfl: 0 ?  torsemide (DEMADEX) 20 MG tablet, Take 1 tablet (20 mg total) by mouth 2 (two) times daily., Disp: 60 tablet, Rfl: 0 ?  albuterol (ACCUNEB) 1.25 MG/3ML nebulizer solution, Take 1 ampule by nebulization every 6  (six) hours as needed for wheezing., Disp: , Rfl:  ?  azelastine (ASTELIN) 0.1 % nasal spray, Place 1 spray into both nostrils daily. (Patient not taking: Reported on 09/06/2021), Disp: , Rfl:  ?  azelastine (ASTELIN) 0.1 % nasal spray, Place into the nose. (Patient not taking: Reported on 09/06/2021), Disp: , Rfl:  ?  omeprazole (PRILOSEC) 40 MG capsule, Take 40 mg by mouth daily.  (Patient not taking: Reported on 09/06/2021), Disp: , Rfl:  ?  polyethylene glycol powder (GLYCOLAX/MIRALAX) 17 GM/SCOOP powder, Take by mouth. (Patient not taking: Reported on 09/06/2021), Disp: , Rfl:  ?  polyvinyl alcohol (LIQUIFILM TEARS) 1.4 % ophthalmic solution, Apply to eye. (Patient not taking: Reported on 09/06/2021), Disp: , Rfl:  ?No current facility-administered medications for this visit. ? ?Facility-Administered Medications Ordered in Other Visits:  ?  epoetin alfa-epbx (RETACRIT) injection 40,000 Units,  40,000 Units, Subcutaneous, Q21 days, Sindy Guadeloupe, MD, 40,000 Units at 09/06/21 1425 ? ?Physical exam:  ?Vitals:  ? 09/06/21 1323  ?BP: 118/84  ?Pulse: (!) 114  ?SpO2: 96%  ?Weight: 131 lb (59.4 kg)  ? ?Physical Exam ?Cardiovascular:  ?   Rate and Rhythm: Normal rate and regular rhythm.  ?   Heart sounds: Normal heart sounds.  ?Pulmonary:  ?   Effort: Pulmonary effort is normal.  ?   Comments: Breath sounds decreased over lung bases ?Abdominal:  ?   General: Bowel sounds are normal.  ?   Palpations: Abdomen is soft.  ?Skin: ?   General: Skin is warm and dry.  ?Neurological:  ?   Mental Status: She is alert and oriented to person, place, and time.  ?  ? ? ?  Latest Ref Rng & Units 07/28/2021  ?  2:24 PM  ?CMP  ?Glucose 70 - 99 mg/dL 116    ?BUN 8 - 23 mg/dL 31    ?Creatinine 0.44 - 1.00 mg/dL 1.20    ?Sodium 135 - 145 mmol/L 131    ?Potassium 3.5 - 5.1 mmol/L 3.8    ?Chloride 98 - 111 mmol/L 93    ?CO2 22 - 32 mmol/L 29    ?Calcium 8.9 - 10.3 mg/dL 8.6    ?Total Protein 6.5 - 8.1 g/dL 6.3    ?Total Bilirubin 0.3 - 1.2 mg/dL 0.2     ?Alkaline Phos 38 - 126 U/L 85    ?AST 15 - 41 U/L 29    ?ALT 0 - 44 U/L 19    ? ? ?  Latest Ref Rng & Units 07/28/2021  ?  2:24 PM  ?CBC  ?WBC 4.0 - 10.5 K/uL 7.0    ?Hemoglobin 12.0 - 15.0 g/dL 9.1    ?[place]

## 2021-09-27 ENCOUNTER — Inpatient Hospital Stay: Payer: Medicare Other

## 2021-09-27 VITALS — BP 106/50 | HR 69

## 2021-09-27 DIAGNOSIS — D638 Anemia in other chronic diseases classified elsewhere: Secondary | ICD-10-CM

## 2021-09-27 DIAGNOSIS — D649 Anemia, unspecified: Secondary | ICD-10-CM

## 2021-09-27 DIAGNOSIS — N1831 Chronic kidney disease, stage 3a: Secondary | ICD-10-CM | POA: Diagnosis not present

## 2021-09-27 LAB — HEMOGLOBIN AND HEMATOCRIT, BLOOD
HCT: 28.2 % — ABNORMAL LOW (ref 36.0–46.0)
Hemoglobin: 8.5 g/dL — ABNORMAL LOW (ref 12.0–15.0)

## 2021-09-27 MED ORDER — EPOETIN ALFA-EPBX 40000 UNIT/ML IJ SOLN
40000.0000 [IU] | INTRAMUSCULAR | Status: DC
  Administered 2021-09-27: 40000 [IU] via SUBCUTANEOUS
  Filled 2021-09-27: qty 1

## 2021-10-18 ENCOUNTER — Other Ambulatory Visit: Payer: Self-pay

## 2021-10-18 ENCOUNTER — Inpatient Hospital Stay: Payer: Medicare Other

## 2021-10-18 ENCOUNTER — Inpatient Hospital Stay: Payer: Medicare Other | Attending: Oncology

## 2021-10-18 VITALS — BP 109/57 | HR 84

## 2021-10-18 DIAGNOSIS — D631 Anemia in chronic kidney disease: Secondary | ICD-10-CM | POA: Insufficient documentation

## 2021-10-18 DIAGNOSIS — N1831 Chronic kidney disease, stage 3a: Secondary | ICD-10-CM | POA: Diagnosis present

## 2021-10-18 DIAGNOSIS — I129 Hypertensive chronic kidney disease with stage 1 through stage 4 chronic kidney disease, or unspecified chronic kidney disease: Secondary | ICD-10-CM | POA: Insufficient documentation

## 2021-10-18 DIAGNOSIS — D649 Anemia, unspecified: Secondary | ICD-10-CM

## 2021-10-18 DIAGNOSIS — D638 Anemia in other chronic diseases classified elsewhere: Secondary | ICD-10-CM

## 2021-10-18 LAB — HEMOGLOBIN AND HEMATOCRIT, BLOOD
HCT: 31.4 % — ABNORMAL LOW (ref 36.0–46.0)
Hemoglobin: 9.4 g/dL — ABNORMAL LOW (ref 12.0–15.0)

## 2021-10-18 MED ORDER — EPOETIN ALFA-EPBX 40000 UNIT/ML IJ SOLN
40000.0000 [IU] | INTRAMUSCULAR | Status: DC
  Administered 2021-10-18: 40000 [IU] via SUBCUTANEOUS
  Filled 2021-10-18: qty 1

## 2021-11-08 ENCOUNTER — Inpatient Hospital Stay: Payer: Medicare Other | Attending: Oncology

## 2021-11-08 ENCOUNTER — Inpatient Hospital Stay: Payer: Medicare Other

## 2021-11-08 VITALS — BP 110/68 | HR 82

## 2021-11-08 DIAGNOSIS — Z79899 Other long term (current) drug therapy: Secondary | ICD-10-CM | POA: Diagnosis not present

## 2021-11-08 DIAGNOSIS — N1831 Chronic kidney disease, stage 3a: Secondary | ICD-10-CM | POA: Insufficient documentation

## 2021-11-08 DIAGNOSIS — D649 Anemia, unspecified: Secondary | ICD-10-CM

## 2021-11-08 DIAGNOSIS — D631 Anemia in chronic kidney disease: Secondary | ICD-10-CM | POA: Diagnosis present

## 2021-11-08 DIAGNOSIS — D638 Anemia in other chronic diseases classified elsewhere: Secondary | ICD-10-CM

## 2021-11-08 LAB — IRON AND TIBC
Iron: 23 ug/dL — ABNORMAL LOW (ref 28–170)
Saturation Ratios: 6 % — ABNORMAL LOW (ref 10.4–31.8)
TIBC: 385 ug/dL (ref 250–450)
UIBC: 362 ug/dL

## 2021-11-08 LAB — CBC WITH DIFFERENTIAL/PLATELET
Abs Immature Granulocytes: 0.02 10*3/uL (ref 0.00–0.07)
Basophils Absolute: 0.1 10*3/uL (ref 0.0–0.1)
Basophils Relative: 1 %
Eosinophils Absolute: 0.2 10*3/uL (ref 0.0–0.5)
Eosinophils Relative: 3 %
HCT: 33.6 % — ABNORMAL LOW (ref 36.0–46.0)
Hemoglobin: 10 g/dL — ABNORMAL LOW (ref 12.0–15.0)
Immature Granulocytes: 0 %
Lymphocytes Relative: 25 %
Lymphs Abs: 1.5 10*3/uL (ref 0.7–4.0)
MCH: 25.7 pg — ABNORMAL LOW (ref 26.0–34.0)
MCHC: 29.8 g/dL — ABNORMAL LOW (ref 30.0–36.0)
MCV: 86.4 fL (ref 80.0–100.0)
Monocytes Absolute: 0.8 10*3/uL (ref 0.1–1.0)
Monocytes Relative: 14 %
Neutro Abs: 3.4 10*3/uL (ref 1.7–7.7)
Neutrophils Relative %: 57 %
Platelets: 212 10*3/uL (ref 150–400)
RBC: 3.89 MIL/uL (ref 3.87–5.11)
RDW: 19.8 % — ABNORMAL HIGH (ref 11.5–15.5)
WBC: 5.9 10*3/uL (ref 4.0–10.5)
nRBC: 0 % (ref 0.0–0.2)

## 2021-11-08 LAB — FERRITIN: Ferritin: 16 ng/mL (ref 11–307)

## 2021-11-08 MED ORDER — EPOETIN ALFA-EPBX 40000 UNIT/ML IJ SOLN
40000.0000 [IU] | INTRAMUSCULAR | Status: DC
  Administered 2021-11-08: 40000 [IU] via SUBCUTANEOUS
  Filled 2021-11-08: qty 1

## 2021-11-10 ENCOUNTER — Other Ambulatory Visit: Payer: Self-pay | Admitting: Oncology

## 2021-11-15 ENCOUNTER — Inpatient Hospital Stay: Payer: Medicare Other

## 2021-11-15 VITALS — BP 115/57 | HR 74 | Temp 97.8°F | Resp 18

## 2021-11-15 DIAGNOSIS — D638 Anemia in other chronic diseases classified elsewhere: Secondary | ICD-10-CM

## 2021-11-15 DIAGNOSIS — N1831 Chronic kidney disease, stage 3a: Secondary | ICD-10-CM | POA: Diagnosis not present

## 2021-11-15 MED ORDER — SODIUM CHLORIDE 0.9 % IV SOLN
510.0000 mg | INTRAVENOUS | Status: DC
  Administered 2021-11-15: 510 mg via INTRAVENOUS
  Filled 2021-11-15: qty 17

## 2021-11-15 MED ORDER — SODIUM CHLORIDE 0.9 % IV SOLN
Freq: Once | INTRAVENOUS | Status: AC
  Filled 2021-11-15: qty 250

## 2021-11-15 MED FILL — Ferumoxytol Inj 510 MG/17ML (30 MG/ML) (Elemental Fe): INTRAVENOUS | Qty: 17 | Status: AC

## 2021-11-23 ENCOUNTER — Other Ambulatory Visit: Payer: Medicare Other

## 2021-11-23 ENCOUNTER — Ambulatory Visit: Payer: Medicare Other | Admitting: Oncology

## 2021-11-23 ENCOUNTER — Inpatient Hospital Stay: Payer: Medicare Other

## 2021-11-23 VITALS — BP 128/71 | HR 66 | Temp 96.2°F | Resp 16

## 2021-11-23 DIAGNOSIS — N1831 Chronic kidney disease, stage 3a: Secondary | ICD-10-CM | POA: Diagnosis not present

## 2021-11-23 DIAGNOSIS — D638 Anemia in other chronic diseases classified elsewhere: Secondary | ICD-10-CM

## 2021-11-23 MED ORDER — SODIUM CHLORIDE 0.9 % IV SOLN
Freq: Once | INTRAVENOUS | Status: AC
  Filled 2021-11-23: qty 250

## 2021-11-23 MED ORDER — SODIUM CHLORIDE 0.9 % IV SOLN
510.0000 mg | INTRAVENOUS | Status: DC
  Administered 2021-11-23: 510 mg via INTRAVENOUS
  Filled 2021-11-23: qty 17

## 2021-11-26 MED FILL — Ferumoxytol Inj 510 MG/17ML (30 MG/ML) (Elemental Fe): INTRAVENOUS | Qty: 17 | Status: AC

## 2021-11-28 ENCOUNTER — Other Ambulatory Visit: Payer: Self-pay | Admitting: *Deleted

## 2021-11-28 DIAGNOSIS — D649 Anemia, unspecified: Secondary | ICD-10-CM

## 2021-11-29 ENCOUNTER — Inpatient Hospital Stay: Payer: Medicare Other

## 2021-11-29 DIAGNOSIS — N1831 Chronic kidney disease, stage 3a: Secondary | ICD-10-CM | POA: Diagnosis not present

## 2021-11-29 DIAGNOSIS — D649 Anemia, unspecified: Secondary | ICD-10-CM

## 2021-11-29 LAB — IRON AND TIBC
Iron: 91 ug/dL (ref 28–170)
Saturation Ratios: 29 % (ref 10.4–31.8)
TIBC: 309 ug/dL (ref 250–450)
UIBC: 218 ug/dL

## 2021-11-29 LAB — CBC
HCT: 37.7 % (ref 36.0–46.0)
Hemoglobin: 11.8 g/dL — ABNORMAL LOW (ref 12.0–15.0)
MCH: 27.4 pg (ref 26.0–34.0)
MCHC: 31.3 g/dL (ref 30.0–36.0)
MCV: 87.5 fL (ref 80.0–100.0)
Platelets: 168 10*3/uL (ref 150–400)
RBC: 4.31 MIL/uL (ref 3.87–5.11)
RDW: 22.8 % — ABNORMAL HIGH (ref 11.5–15.5)
WBC: 6 10*3/uL (ref 4.0–10.5)
nRBC: 0 % (ref 0.0–0.2)

## 2021-11-29 LAB — FERRITIN: Ferritin: 529 ng/mL — ABNORMAL HIGH (ref 11–307)

## 2021-12-20 ENCOUNTER — Inpatient Hospital Stay: Payer: Medicare Other

## 2021-12-20 ENCOUNTER — Other Ambulatory Visit: Payer: Self-pay | Admitting: *Deleted

## 2021-12-20 ENCOUNTER — Inpatient Hospital Stay: Payer: Medicare Other | Attending: Oncology

## 2021-12-20 ENCOUNTER — Other Ambulatory Visit: Payer: Self-pay

## 2021-12-20 DIAGNOSIS — D72820 Lymphocytosis (symptomatic): Secondary | ICD-10-CM

## 2021-12-20 DIAGNOSIS — D638 Anemia in other chronic diseases classified elsewhere: Secondary | ICD-10-CM

## 2021-12-20 DIAGNOSIS — N1831 Chronic kidney disease, stage 3a: Secondary | ICD-10-CM | POA: Diagnosis not present

## 2021-12-20 DIAGNOSIS — D649 Anemia, unspecified: Secondary | ICD-10-CM | POA: Insufficient documentation

## 2021-12-20 LAB — CBC WITH DIFFERENTIAL/PLATELET
Abs Immature Granulocytes: 0.02 10*3/uL (ref 0.00–0.07)
Basophils Absolute: 0 10*3/uL (ref 0.0–0.1)
Basophils Relative: 1 %
Eosinophils Absolute: 0.2 10*3/uL (ref 0.0–0.5)
Eosinophils Relative: 4 %
HCT: 38.5 % (ref 36.0–46.0)
Hemoglobin: 12.1 g/dL (ref 12.0–15.0)
Immature Granulocytes: 0 %
Lymphocytes Relative: 22 %
Lymphs Abs: 1.5 10*3/uL (ref 0.7–4.0)
MCH: 28.6 pg (ref 26.0–34.0)
MCHC: 31.4 g/dL (ref 30.0–36.0)
MCV: 91 fL (ref 80.0–100.0)
Monocytes Absolute: 0.8 10*3/uL (ref 0.1–1.0)
Monocytes Relative: 12 %
Neutro Abs: 4.2 10*3/uL (ref 1.7–7.7)
Neutrophils Relative %: 61 %
Platelets: 161 10*3/uL (ref 150–400)
RBC: 4.23 MIL/uL (ref 3.87–5.11)
RDW: 23.9 % — ABNORMAL HIGH (ref 11.5–15.5)
WBC: 6.8 10*3/uL (ref 4.0–10.5)
nRBC: 0 % (ref 0.0–0.2)

## 2021-12-20 LAB — IRON AND TIBC
Iron: 58 ug/dL (ref 28–170)
Saturation Ratios: 22 % (ref 10.4–31.8)
TIBC: 263 ug/dL (ref 250–450)
UIBC: 205 ug/dL

## 2021-12-20 LAB — FERRITIN: Ferritin: 169 ng/mL (ref 11–307)

## 2021-12-23 ENCOUNTER — Other Ambulatory Visit: Payer: Self-pay

## 2021-12-23 DIAGNOSIS — D638 Anemia in other chronic diseases classified elsewhere: Secondary | ICD-10-CM

## 2021-12-27 ENCOUNTER — Inpatient Hospital Stay (HOSPITAL_BASED_OUTPATIENT_CLINIC_OR_DEPARTMENT_OTHER): Payer: Medicare Other | Admitting: Oncology

## 2021-12-27 ENCOUNTER — Encounter: Payer: Self-pay | Admitting: Oncology

## 2021-12-27 ENCOUNTER — Other Ambulatory Visit: Payer: Medicare Other

## 2021-12-27 VITALS — BP 118/77 | HR 102 | Temp 97.7°F | Wt 133.4 lb

## 2021-12-27 DIAGNOSIS — D649 Anemia, unspecified: Secondary | ICD-10-CM | POA: Diagnosis not present

## 2021-12-27 DIAGNOSIS — D72820 Lymphocytosis (symptomatic): Secondary | ICD-10-CM

## 2021-12-27 NOTE — Progress Notes (Signed)
Hematology/Oncology Consult note [facility]  Telephone:([phone] [phone]  Patient Care Team: Adin Hector, MD as PCP - General (Internal Medicine)   Name of the patient: Allison Novak  [id number]  1925/11/16   Date of visit: 12/27/21  Diagnosis- 1.  Normocytic anemia likely secondary to iron deficiency and chronic disease 2. Monoclonal B-cell lymphocytosis  Chief complaint/ Reason for visit-routine follow-up of anemia  Heme/Onc history: Patient is a 88 year old female with a past medical history significant for atrial fibrillation, stage IIIa CKD, TIA, hyperlipidemia, moderate pulmonary hypertension, diastolic CHF who was admitted to the hospital in March 2023 with symptoms of shortness of breath.  She was treated for CHF with IV Lasix and also underwent thoracentesis on 07/23/2021 when 400 cc of fluid was drained.  Flow cytometry on the pleural fluid showed partial CD5 positive monoclonal B-cell population but the phenotype was nonspecific.  Differential diagnosis includes atypical CLL, SLL, mantle cell lymphoma and other mature B-cell lymphomas.  Frequent red cells present on smear and a possible peripheral blood contamination cannot be excluded.     Blood work from 07/28/2021 showed white cell count of 7 with a normal differential, H&H of 9.1/29.6 with an MCV of 88 and a platelet count of 226.  CMP showed mildly elevated creatinine of 1.2 B12 and folate were normal.  Ferritin normal at 158.  Reticulocyte count low for the degree of anemia at 2.1%.  Myeloma panel showed 0.1 g IgG kappa monoclonal protein.  Haptoglobin and TSH normal.  Flow cytometry in the peripheral blood showed atypical CD5 positive B-cell population representing less than 1% of the leukocytes.    Interval history-patient is here with her son today.  She has been doing well overall.  Ambulating mostly with a walker but at times is able to ambulate without a walker.  Is  improved.  She has not required any thoracentesis recently.  ECOG PS- 2 Pain scale- 0 Opioid associated constipation- no  Review of systems- Review of Systems  Constitutional:  Negative for chills, fever, malaise/fatigue and weight loss.  HENT:  Negative for congestion, ear discharge and nosebleeds.   Eyes:  Negative for blurred vision.  Respiratory:  Negative for cough, hemoptysis, sputum production, shortness of breath and wheezing.   Cardiovascular:  Negative for chest pain, palpitations, orthopnea and claudication.  Gastrointestinal:  Negative for abdominal pain, blood in stool, constipation, diarrhea, heartburn, melena, nausea and vomiting.  Genitourinary:  Negative for dysuria, flank pain, frequency, hematuria and urgency.  Musculoskeletal:  Negative for back pain, joint pain and myalgias.  Skin:  Negative for rash.  Neurological:  Negative for dizziness, tingling, focal weakness, seizures, weakness and headaches.  Endo/Heme/Allergies:  Does not bruise/bleed easily.  Psychiatric/Behavioral:  Negative for depression and suicidal ideas. The patient does not have insomnia.       Allergies  Allergen Reactions   Keflex [Cephalexin] Other (See Comments)    Pt cant remember exact reaction   Macrodantin [Nitrofurantoin Macrocrystal] Other (See Comments)     Past Medical History:  Diagnosis Date   A-fib ([place])    Anemia    Carotid artery stenosis    CHF (congestive heart failure) ([facility])    Chronic kidney disease    Coronary artery disease    Diverticulosis    GERD (gastroesophageal reflux disease)    Gout    Hiatal hernia    Hyperlipidemia    Hypertension    Osteoporosis    Pulmonary embolus ([place])  Past Surgical History:  Procedure Laterality Date   ABDOMINAL HYSTERECTOMY     ANKLE SURGERY Left 2011   KNEE ARTHROSCOPY Right     Social History   Socioeconomic History   Marital status: Widowed    Spouse name: Not on file   Number of children: Not on file    Years of education: Not on file   Highest education level: Not on file  Occupational History   Not on file  Tobacco Use   Smoking status: Never   Smokeless tobacco: Never  Substance and Sexual Activity   Alcohol use: No    Alcohol/week: 0.0 standard drinks of alcohol   Drug use: No   Sexual activity: Never  Other Topics Concern   Not on file  Social History Narrative   Not on file   Social Determinants of Health   Financial Resource Strain: Not on file  Food Insecurity: Not on file  Transportation Needs: Not on file  Physical Activity: Not on file  Stress: Not on file  Social Connections: Not on file  Intimate Partner Violence: Not on file    Family History  Problem Relation Age of Onset   CAD Mother    Hypertension Sister    COPD Brother      Current Outpatient Medications:    albuterol (ACCUNEB) 1.25 MG/3ML nebulizer solution, Take 1 ampule by nebulization every 6 (six) hours as needed for wheezing., Disp: , Rfl:    allopurinol (ZYLOPRIM) 100 MG tablet, Take 100 mg by mouth daily., Disp: , Rfl:    atorvastatin (LIPITOR) 40 MG tablet, Take 40 mg by mouth daily., Disp: , Rfl: 2   calcium carbonate (OSCAL) 1500 (600 Ca) MG TABS tablet, Take 1 tablet by mouth in the morning and at bedtime., Disp: , Rfl:    Cholecalciferol 50 MCG (2000 UT) CAPS, Take 2,000 Units by mouth daily., Disp: , Rfl:    digoxin (LANOXIN) 0.125 MG tablet, Take by mouth., Disp: , Rfl:    ELIQUIS 2.5 MG TABS tablet, Take 2.5 mg by mouth 2 (two) times daily., Disp: , Rfl:    esomeprazole (NEXIUM) 40 MG capsule, Take by mouth., Disp: , Rfl:    levothyroxine (SYNTHROID) 25 MCG tablet, Take 25 mcg by mouth daily., Disp: , Rfl:    metoprolol succinate (TOPROL-XL) 50 MG 24 hr tablet, Take 1 tablet (50 mg total) by mouth daily. Take with or immediately following a meal., Disp: 30 tablet, Rfl: 1   Multiple Vitamin (MULTI-VITAMIN) tablet, Take 1 tablet by mouth daily., Disp: , Rfl:    potassium chloride  (KLOR-CON) 10 MEQ tablet, Take 10 mEq by mouth daily., Disp: , Rfl:    torsemide (DEMADEX) 20 MG tablet, Take 1 tablet (20 mg total) by mouth 2 (two) times daily., Disp: 60 tablet, Rfl: 0   azelastine (ASTELIN) 0.1 % nasal spray, Place 1 spray into both nostrils daily. (Patient not taking: Reported on 09/06/2021), Disp: , Rfl:    azelastine (ASTELIN) 0.1 % nasal spray, Place into the nose. (Patient not taking: Reported on 09/06/2021), Disp: , Rfl:    omeprazole (PRILOSEC) 40 MG capsule, Take 40 mg by mouth daily.  (Patient not taking: Reported on 09/06/2021), Disp: , Rfl:    polyethylene glycol powder (GLYCOLAX/MIRALAX) 17 GM/SCOOP powder, Take by mouth. (Patient not taking: Reported on 09/06/2021), Disp: , Rfl:    polyvinyl alcohol (LIQUIFILM TEARS) 1.4 % ophthalmic solution, Apply to eye. (Patient not taking: Reported on 09/06/2021), Disp: , Rfl:  potassium chloride SA (KLOR-CON M) 20 MEQ tablet, Take 1 tablet (20 mEq total) by mouth 2 (two) times daily. (Patient not taking: Reported on 12/27/2021), Disp: 60 tablet, Rfl: 0 No current facility-administered medications for this visit.  Facility-Administered Medications Ordered in Other Visits:    epoetin alfa-epbx (RETACRIT) injection 40,000 Units, 40,000 Units, Subcutaneous, Q21 days, Sindy Guadeloupe, MD, 40,000 Units at 09/06/21 1425  Physical exam:  Vitals:   12/27/21 1401  BP: 118/77  Pulse: (!) 102  Temp: 97.7 F (36.5 C)  SpO2: 94%  Weight: 133 lb 6.4 oz (60.5 kg)   Physical Exam Constitutional:      General: She is not in acute distress. Cardiovascular:     Rate and Rhythm: Normal rate and regular rhythm.     Heart sounds: Normal heart sounds.  Pulmonary:     Effort: Pulmonary effort is normal.     Breath sounds: Normal breath sounds.  Abdominal:     General: Bowel sounds are normal.     Palpations: Abdomen is soft.  Skin:    General: Skin is warm and dry.  Neurological:     Mental Status: She is alert and oriented to person,  place, and time.         Latest Ref Rng & Units 07/28/2021    2:24 PM  CMP  Glucose 70 - 99 mg/dL 116   BUN 8 - 23 mg/dL 31   Creatinine 0.44 - 1.00 mg/dL 1.20   Sodium 135 - 145 mmol/L 131   Potassium 3.5 - 5.1 mmol/L 3.8   Chloride 98 - 111 mmol/L 93   CO2 22 - 32 mmol/L 29   Calcium 8.9 - 10.3 mg/dL 8.6   Total Protein 6.5 - 8.1 g/dL 6.3   Total Bilirubin 0.3 - 1.2 mg/dL 0.2   Alkaline Phos 38 - 126 U/L 85   AST 15 - 41 U/L 29   ALT 0 - 44 U/L 19       Latest Ref Rng & Units 12/20/2021    1:53 PM  CBC  WBC 4.0 - 10.5 K/uL 6.8   Hemoglobin 12.0 - 15.0 g/dL 12.1   Hematocrit 36.0 - 46.0 % 38.5   Platelets 150 - 400 K/uL 161      Assessment and plan- Patient is a 88 y.o. female Who is here for routine follow-up of anemia  Patient's hemoglobin was down to an 8 about 4 months ago.  Recently she received received 2 doses of Feraheme in July 2023 and 4 doses of Retacrit so far.  She has not required Retacrit since July 2023.  Her hemoglobin is up to 12.1 today.  I am holding off on any Retacrit at this time.  I will repeat CBC with differential with ferritin and iron studies in 2 and 4 months and see her back in 4 months  Monoclonal B-cell lymphocytosis: White cell count is normal.  Clonal B-cell lymphocytosis was incidentally noted on her pleural fluid.  PET scan is otherwise normal.  No indication to treat her CLL at this time   Visit Diagnosis 1. Monoclonal B-cell lymphocytosis   2. Normocytic anemia      Dr. Randa Evens, MD, MPH [facility] [phone] 12/27/2021 4:15 PM

## 2022-01-18 DIAGNOSIS — C9110 Chronic lymphocytic leukemia of B-cell type not having achieved remission: Secondary | ICD-10-CM | POA: Insufficient documentation

## 2022-02-28 ENCOUNTER — Inpatient Hospital Stay: Payer: Medicare Other | Attending: Oncology

## 2022-02-28 DIAGNOSIS — D72820 Lymphocytosis (symptomatic): Secondary | ICD-10-CM

## 2022-02-28 DIAGNOSIS — D649 Anemia, unspecified: Secondary | ICD-10-CM | POA: Diagnosis present

## 2022-02-28 LAB — CBC WITH DIFFERENTIAL/PLATELET
Abs Immature Granulocytes: 0.02 10*3/uL (ref 0.00–0.07)
Basophils Absolute: 0.1 10*3/uL (ref 0.0–0.1)
Basophils Relative: 1 %
Eosinophils Absolute: 0.2 10*3/uL (ref 0.0–0.5)
Eosinophils Relative: 3 %
HCT: 38.4 % (ref 36.0–46.0)
Hemoglobin: 12.6 g/dL (ref 12.0–15.0)
Immature Granulocytes: 0 %
Lymphocytes Relative: 23 %
Lymphs Abs: 1.5 10*3/uL (ref 0.7–4.0)
MCH: 32.6 pg (ref 26.0–34.0)
MCHC: 32.8 g/dL (ref 30.0–36.0)
MCV: 99.2 fL (ref 80.0–100.0)
Monocytes Absolute: 0.8 10*3/uL (ref 0.1–1.0)
Monocytes Relative: 12 %
Neutro Abs: 4.1 10*3/uL (ref 1.7–7.7)
Neutrophils Relative %: 61 %
Platelets: 209 10*3/uL (ref 150–400)
RBC: 3.87 MIL/uL (ref 3.87–5.11)
RDW: 15.4 % (ref 11.5–15.5)
WBC: 6.7 10*3/uL (ref 4.0–10.5)
nRBC: 0 % (ref 0.0–0.2)

## 2022-02-28 LAB — IRON AND TIBC
Iron: 67 ug/dL (ref 28–170)
Saturation Ratios: 24 % (ref 10.4–31.8)
TIBC: 277 ug/dL (ref 250–450)
UIBC: 210 ug/dL

## 2022-02-28 LAB — FERRITIN: Ferritin: 86 ng/mL (ref 11–307)

## 2022-04-29 ENCOUNTER — Inpatient Hospital Stay: Payer: Medicare Other

## 2022-04-29 ENCOUNTER — Inpatient Hospital Stay: Payer: Medicare Other | Admitting: Oncology

## 2022-04-30 ENCOUNTER — Other Ambulatory Visit: Payer: Self-pay

## 2022-04-30 ENCOUNTER — Emergency Department: Payer: Medicare Other

## 2022-04-30 ENCOUNTER — Inpatient Hospital Stay
Admission: EM | Admit: 2022-04-30 | Discharge: 2022-05-04 | DRG: 871 | Disposition: A | Discharge status: Home / Self Care | Payer: Medicare Other | Attending: Internal Medicine | Admitting: Internal Medicine

## 2022-04-30 DIAGNOSIS — Z8249 Family history of ischemic heart disease and other diseases of the circulatory system: Secondary | ICD-10-CM | POA: Diagnosis not present

## 2022-04-30 DIAGNOSIS — Z881 Allergy status to other antibiotic agents status: Secondary | ICD-10-CM | POA: Diagnosis not present

## 2022-04-30 DIAGNOSIS — Z79890 Hormone replacement therapy (postmenopausal): Secondary | ICD-10-CM | POA: Diagnosis not present

## 2022-04-30 DIAGNOSIS — M810 Age-related osteoporosis without current pathological fracture: Secondary | ICD-10-CM | POA: Diagnosis present

## 2022-04-30 DIAGNOSIS — Z86711 Personal history of pulmonary embolism: Secondary | ICD-10-CM | POA: Diagnosis not present

## 2022-04-30 DIAGNOSIS — R0602 Shortness of breath: Secondary | ICD-10-CM | POA: Diagnosis not present

## 2022-04-30 DIAGNOSIS — I2510 Atherosclerotic heart disease of native coronary artery without angina pectoris: Secondary | ICD-10-CM | POA: Diagnosis present

## 2022-04-30 DIAGNOSIS — J189 Pneumonia, unspecified organism: Secondary | ICD-10-CM

## 2022-04-30 DIAGNOSIS — I130 Hypertensive heart and chronic kidney disease with heart failure and stage 1 through stage 4 chronic kidney disease, or unspecified chronic kidney disease: Secondary | ICD-10-CM | POA: Diagnosis present

## 2022-04-30 DIAGNOSIS — J9601 Acute respiratory failure with hypoxia: Secondary | ICD-10-CM | POA: Diagnosis present

## 2022-04-30 DIAGNOSIS — Z66 Do not resuscitate: Secondary | ICD-10-CM | POA: Diagnosis not present

## 2022-04-30 DIAGNOSIS — K449 Diaphragmatic hernia without obstruction or gangrene: Secondary | ICD-10-CM | POA: Diagnosis present

## 2022-04-30 DIAGNOSIS — N1831 Chronic kidney disease, stage 3a: Secondary | ICD-10-CM | POA: Diagnosis present

## 2022-04-30 DIAGNOSIS — I4891 Unspecified atrial fibrillation: Secondary | ICD-10-CM | POA: Diagnosis not present

## 2022-04-30 DIAGNOSIS — A4189 Other specified sepsis: Principal | ICD-10-CM | POA: Diagnosis present

## 2022-04-30 DIAGNOSIS — Z7951 Long term (current) use of inhaled steroids: Secondary | ICD-10-CM

## 2022-04-30 DIAGNOSIS — Z7901 Long term (current) use of anticoagulants: Secondary | ICD-10-CM | POA: Diagnosis not present

## 2022-04-30 DIAGNOSIS — I2699 Other pulmonary embolism without acute cor pulmonale: Secondary | ICD-10-CM | POA: Diagnosis present

## 2022-04-30 DIAGNOSIS — J690 Pneumonitis due to inhalation of food and vomit: Secondary | ICD-10-CM | POA: Diagnosis present

## 2022-04-30 DIAGNOSIS — E039 Hypothyroidism, unspecified: Secondary | ICD-10-CM | POA: Diagnosis present

## 2022-04-30 DIAGNOSIS — J121 Respiratory syncytial virus pneumonia: Secondary | ICD-10-CM | POA: Diagnosis present

## 2022-04-30 DIAGNOSIS — Z1152 Encounter for screening for COVID-19: Secondary | ICD-10-CM | POA: Diagnosis not present

## 2022-04-30 DIAGNOSIS — Z825 Family history of asthma and other chronic lower respiratory diseases: Secondary | ICD-10-CM | POA: Diagnosis not present

## 2022-04-30 DIAGNOSIS — I10 Essential (primary) hypertension: Secondary | ICD-10-CM | POA: Diagnosis present

## 2022-04-30 DIAGNOSIS — J205 Acute bronchitis due to respiratory syncytial virus: Secondary | ICD-10-CM | POA: Diagnosis present

## 2022-04-30 DIAGNOSIS — I4821 Permanent atrial fibrillation: Secondary | ICD-10-CM | POA: Diagnosis present

## 2022-04-30 DIAGNOSIS — E785 Hyperlipidemia, unspecified: Secondary | ICD-10-CM | POA: Diagnosis present

## 2022-04-30 DIAGNOSIS — I5032 Chronic diastolic (congestive) heart failure: Secondary | ICD-10-CM | POA: Diagnosis present

## 2022-04-30 DIAGNOSIS — A419 Sepsis, unspecified organism: Secondary | ICD-10-CM | POA: Diagnosis not present

## 2022-04-30 DIAGNOSIS — K219 Gastro-esophageal reflux disease without esophagitis: Secondary | ICD-10-CM | POA: Diagnosis present

## 2022-04-30 DIAGNOSIS — Z79899 Other long term (current) drug therapy: Secondary | ICD-10-CM

## 2022-04-30 DIAGNOSIS — M109 Gout, unspecified: Secondary | ICD-10-CM | POA: Diagnosis present

## 2022-04-30 LAB — BASIC METABOLIC PANEL
Anion gap: 11 (ref 5–15)
BUN: 25 mg/dL — ABNORMAL HIGH (ref 8–23)
CO2: 29 mmol/L (ref 22–32)
Calcium: 8.9 mg/dL (ref 8.9–10.3)
Chloride: 94 mmol/L — ABNORMAL LOW (ref 98–111)
Creatinine, Ser: 1.17 mg/dL — ABNORMAL HIGH (ref 0.44–1.00)
GFR, Estimated: 43 mL/min — ABNORMAL LOW (ref >60)
Glucose, Bld: 113 mg/dL — ABNORMAL HIGH (ref 70–99)
Potassium: 3.5 mmol/L (ref 3.5–5.1)
Sodium: 134 mmol/L — ABNORMAL LOW (ref 135–145)

## 2022-04-30 LAB — CBC
HCT: 38.9 % (ref 36.0–46.0)
Hemoglobin: 13 g/dL (ref 12.0–15.0)
MCH: 33.2 pg (ref 26.0–34.0)
MCHC: 33.4 g/dL (ref 30.0–36.0)
MCV: 99.2 fL (ref 80.0–100.0)
Platelets: 160 10*3/uL (ref 150–400)
RBC: 3.92 MIL/uL (ref 3.87–5.11)
RDW: 13.6 % (ref 11.5–15.5)
WBC: 9 10*3/uL (ref 4.0–10.5)
nRBC: 0 % (ref 0.0–0.2)

## 2022-04-30 LAB — TROPONIN I (HIGH SENSITIVITY)
Troponin I (High Sensitivity): 12 ng/L (ref <18)
Troponin I (High Sensitivity): 12 ng/L (ref <18)

## 2022-04-30 LAB — DIGOXIN LEVEL: Digoxin Level: 0.7 ng/mL — ABNORMAL LOW (ref 0.8–2.0)

## 2022-04-30 LAB — LACTIC ACID, PLASMA
Lactic Acid, Venous: 1.1 mmol/L (ref 0.5–1.9)
Lactic Acid, Venous: 1.5 mmol/L (ref 0.5–1.9)

## 2022-04-30 LAB — BRAIN NATRIURETIC PEPTIDE: B Natriuretic Peptide: 275.2 pg/mL — ABNORMAL HIGH (ref 0.0–100.0)

## 2022-04-30 MED ORDER — AMOXICILLIN-POT CLAVULANATE 875-125 MG PO TABS
1.0000 | ORAL_TABLET | Freq: Two times a day (BID) | ORAL | Status: DC
  Administered 2022-04-30 – 2022-05-04 (×8): 1 via ORAL
  Filled 2022-04-30 (×8): qty 1

## 2022-04-30 MED ORDER — TORSEMIDE 20 MG PO TABS: 20.0000 mg | ORAL_TABLET | Freq: Two times a day (BID) | ORAL | Status: DC

## 2022-04-30 MED ORDER — POTASSIUM CHLORIDE CRYS ER 10 MEQ PO TBCR
10.0000 meq | EXTENDED_RELEASE_TABLET | Freq: Every day | ORAL | Status: DC
  Administered 2022-04-30 – 2022-05-04 (×5): 10 meq via ORAL
  Filled 2022-04-30 (×5): qty 1

## 2022-04-30 MED ORDER — ALBUTEROL SULFATE (2.5 MG/3ML) 0.083% IN NEBU: 2.5000 mg | INHALATION_SOLUTION | Freq: Two times a day (BID) | RESPIRATORY_TRACT | Status: DC

## 2022-04-30 MED ORDER — METHYLPREDNISOLONE 4 MG PO TBPK
4.0000 mg | ORAL_TABLET | ORAL | Status: AC
  Administered 2022-04-30: 4 mg via ORAL
  Filled 2022-04-30: qty 21

## 2022-04-30 MED ORDER — DIGOXIN 125 MCG PO TABS
0.1250 mg | ORAL_TABLET | Freq: Every day | ORAL | Status: DC
  Administered 2022-05-01 – 2022-05-04 (×4): 0.125 mg via ORAL
  Filled 2022-04-30 (×4): qty 1

## 2022-04-30 MED ORDER — LACTATED RINGERS IV SOLN: INTRAVENOUS | Status: DC

## 2022-04-30 MED ORDER — ACETAMINOPHEN 650 MG RE SUPP: 650.0000 mg | Freq: Four times a day (QID) | RECTAL | Status: DC | PRN

## 2022-04-30 MED ORDER — ATORVASTATIN CALCIUM 20 MG PO TABS
40.0000 mg | ORAL_TABLET | Freq: Every day | ORAL | Status: DC
  Administered 2022-05-01 – 2022-05-04 (×4): 40 mg via ORAL
  Filled 2022-04-30 (×4): qty 2

## 2022-04-30 MED ORDER — METHYLPREDNISOLONE SODIUM SUCC 40 MG IJ SOLR
40.0000 mg | Freq: Two times a day (BID) | INTRAMUSCULAR | Status: DC
  Filled 2022-04-30: qty 1

## 2022-04-30 MED ORDER — METHYLPREDNISOLONE 4 MG PO TBPK
4.0000 mg | ORAL_TABLET | Freq: Three times a day (TID) | ORAL | Status: AC
  Administered 2022-05-01 (×3): 4 mg via ORAL

## 2022-04-30 MED ORDER — LEVOTHYROXINE SODIUM 25 MCG PO TABS
25.0000 ug | ORAL_TABLET | Freq: Every day | ORAL | Status: DC
  Administered 2022-05-01 – 2022-05-04 (×4): 25 ug via ORAL
  Filled 2022-04-30 (×4): qty 1

## 2022-04-30 MED ORDER — APIXABAN 2.5 MG PO TABS: 2.5000 mg | ORAL_TABLET | Freq: Two times a day (BID) | ORAL | Status: DC

## 2022-04-30 MED ORDER — ALLOPURINOL 100 MG PO TABS
100.0000 mg | ORAL_TABLET | Freq: Every day | ORAL | Status: DC
  Administered 2022-05-01 – 2022-05-04 (×4): 100 mg via ORAL
  Filled 2022-04-30 (×4): qty 1

## 2022-04-30 MED ORDER — METHYLPREDNISOLONE 4 MG PO TBPK
8.0000 mg | ORAL_TABLET | Freq: Every morning | ORAL | Status: AC
  Administered 2022-05-01: 8 mg via ORAL
  Filled 2022-04-30: qty 21

## 2022-04-30 MED ORDER — METRONIDAZOLE 500 MG/100ML IV SOLN
500.0000 mg | Freq: Once | INTRAVENOUS | Status: DC
  Administered 2022-04-30: 500 mg via INTRAVENOUS
  Filled 2022-04-30: qty 100

## 2022-04-30 MED ORDER — METHYLPREDNISOLONE 4 MG PO TBPK
4.0000 mg | ORAL_TABLET | ORAL | Status: AC
  Administered 2022-04-30: 4 mg via ORAL

## 2022-04-30 MED ORDER — ACETAMINOPHEN 325 MG PO TABS: 650.0000 mg | ORAL_TABLET | Freq: Four times a day (QID) | ORAL | Status: DC | PRN

## 2022-04-30 MED ORDER — METOPROLOL SUCCINATE ER 50 MG PO TB24
50.0000 mg | ORAL_TABLET | Freq: Every day | ORAL | Status: DC
  Administered 2022-04-30 – 2022-05-04 (×5): 50 mg via ORAL
  Filled 2022-04-30 (×5): qty 1

## 2022-04-30 MED ORDER — IOHEXOL 350 MG/ML SOLN
75.0000 mL | Freq: Once | INTRAVENOUS | Status: AC | PRN
  Administered 2022-04-30: 75 mL via INTRAVENOUS

## 2022-04-30 MED ORDER — PANTOPRAZOLE SODIUM 40 MG PO TBEC
40.0000 mg | DELAYED_RELEASE_TABLET | Freq: Every day | ORAL | Status: DC
  Administered 2022-05-01 – 2022-05-04 (×4): 40 mg via ORAL
  Filled 2022-04-30 (×4): qty 1

## 2022-04-30 MED ORDER — POTASSIUM CHLORIDE ER 10 MEQ PO TBCR
10.0000 meq | EXTENDED_RELEASE_TABLET | Freq: Every day | ORAL | Status: DC
  Filled 2022-04-30: qty 1

## 2022-04-30 MED ORDER — SODIUM CHLORIDE 0.9 % IV SOLN: 3.0000 g | Freq: Two times a day (BID) | INTRAVENOUS | Status: DC

## 2022-04-30 MED ORDER — GUAIFENESIN ER 600 MG PO TB12
1200.0000 mg | ORAL_TABLET | Freq: Two times a day (BID) | ORAL | Status: DC
  Administered 2022-04-30 – 2022-05-01 (×3): 1200 mg via ORAL
  Filled 2022-04-30 (×3): qty 2

## 2022-04-30 MED ORDER — ONDANSETRON HCL 4 MG/2ML IJ SOLN: 4.0000 mg | Freq: Four times a day (QID) | INTRAMUSCULAR | Status: DC | PRN

## 2022-04-30 MED ORDER — METHYLPREDNISOLONE 4 MG PO TBPK
8.0000 mg | ORAL_TABLET | Freq: Every evening | ORAL | Status: AC
  Administered 2022-05-01: 8 mg via ORAL

## 2022-04-30 MED ORDER — METHYLPREDNISOLONE 4 MG PO TBPK
8.0000 mg | ORAL_TABLET | Freq: Every evening | ORAL | Status: AC
  Administered 2022-04-30: 8 mg via ORAL

## 2022-04-30 MED ORDER — ADULT MULTIVITAMIN W/MINERALS CH
1.0000 | ORAL_TABLET | Freq: Every day | ORAL | Status: DC
  Administered 2022-04-30 – 2022-05-04 (×5): 1 via ORAL
  Filled 2022-04-30 (×5): qty 1

## 2022-04-30 MED ORDER — IPRATROPIUM-ALBUTEROL 0.5-2.5 (3) MG/3ML IN SOLN
3.0000 mL | Freq: Four times a day (QID) | RESPIRATORY_TRACT | Status: DC | PRN
  Administered 2022-05-03: 3 mL via RESPIRATORY_TRACT
  Filled 2022-04-30: qty 3

## 2022-04-30 MED ORDER — SODIUM CHLORIDE 3 % IN NEBU
4.0000 mL | INHALATION_SOLUTION | Freq: Two times a day (BID) | RESPIRATORY_TRACT | Status: AC
  Administered 2022-04-30 – 2022-05-03 (×5): 4 mL via RESPIRATORY_TRACT
  Filled 2022-04-30 (×7): qty 4

## 2022-04-30 MED ORDER — METHYLPREDNISOLONE 4 MG PO TBPK
4.0000 mg | ORAL_TABLET | Freq: Four times a day (QID) | ORAL | Status: DC
  Administered 2022-05-02 – 2022-05-04 (×8): 4 mg via ORAL
  Filled 2022-04-30: qty 21

## 2022-04-30 MED ORDER — SODIUM CHLORIDE 0.9 % IV SOLN
1.0000 g | Freq: Once | INTRAVENOUS | Status: AC
  Administered 2022-04-30: 1 g via INTRAVENOUS
  Filled 2022-04-30: qty 10

## 2022-04-30 MED ORDER — SODIUM CHLORIDE 0.9 % IV SOLN
500.0000 mg | Freq: Once | INTRAVENOUS | Status: DC
  Administered 2022-04-30: 500 mg via INTRAVENOUS
  Filled 2022-04-30: qty 5

## 2022-04-30 MED ORDER — ONDANSETRON HCL 4 MG PO TABS: 4.0000 mg | ORAL_TABLET | Freq: Four times a day (QID) | ORAL | Status: DC | PRN

## 2022-04-30 NOTE — Consult Note (Signed)
PULMONOLOGY         Date: 04/30/2022,   MRN# [id number] Allison Novak 1926/03/03     Admission                  Current   Referring provider: Dr Francine Graven   CHIEF COMPLAINT:   Sub-Acute Cough  [place]  This is a pleasant 88yo F with hx of sub-acute cough x 2 weeks.  She has a history of A-fib, chronic diastolic dysfunction CHF last known LVEF of 55 to 60%, history of coronary artery disease, stage III chronic kidney disease, hypertension who presents to the ER from home Place of [place] for evaluation of worsening shortness of breath and a refractory cough which is wet sounding and raspy.Patient tested positive for RSV on 04/27/22 and has been on a Z-Pak for presumed pneumonia as well as a steroid taper.  Her symptoms have not improved and her cough is worse as well as shortness of breath and so EMS was called and she was found to have room air pulse oximetry of 87%.  She was placed on 2 L of oxygen with improvement in her pulse oximetry to 96%. She denies having any chest pain, no nausea, no vomiting, no dizziness, no lightheadedness, no fever, no chills, no leg swelling, no palpitations, no blurred vision no focal deficit.  Per ER physician she had a Tmax of 101 in the field and received Tylenol, she is tachycardic and tachypneic Chest x-ray reviewed by me shows right parahilar opacity may reflect pneumonia although central lung lesion not excluded. Right base collapse/consolidation again noted with small right pleural effusion.  Moderate hiatal hernia.  CT imaging CTA showed no evidence of pulmonary embolus. Endobronchial debris seen in the bilateral lower lobes and right middle lobe, causing complete right middle lobe collapse, findingsare likely due to aspiration. Bronchial wall thickening, mosaic attenuation and new scattered bilateral patchy consolidations and small nodular opacities, likely due to infectious bronchitis. Recommend follow-up chest CT in 3  months to ensure resolution. Moderate right pleural effusion with associated atelectasis. Cardiomegaly with severe left main and three-vessel coronary artery calcifications. Large hiatal hernia. Aortic Atherosclerosis. PCCM consultation for further management. She does not use oxygen at home.    PAST MEDICAL HISTORY   Past Medical History:  Diagnosis Date   A-fib ([place])    Anemia    Carotid artery stenosis    CHF (congestive heart failure) (HCC)    Chronic kidney disease    Coronary artery disease    Diverticulosis    GERD (gastroesophageal reflux disease)    Gout    Hiatal hernia    Hyperlipidemia    Hypertension    Osteoporosis    Pulmonary embolus (HCC)      SURGICAL HISTORY   Past Surgical History:  Procedure Laterality Date   ABDOMINAL HYSTERECTOMY     ANKLE SURGERY Left 2011   KNEE ARTHROSCOPY Right      FAMILY HISTORY   Family History  Problem Relation Age of Onset   CAD Mother    Hypertension Sister    COPD Brother      SOCIAL HISTORY   Social History   Tobacco Use   Smoking status: Never   Smokeless tobacco: Never  Substance Use Topics   Alcohol use: No    Alcohol/week: 0.0 standard drinks of alcohol   Drug use: No     MEDICATIONS    Home Medication:  Current Outpatient Rx  Order #: [id number] Class: Historical Med   Order #: [id number] Class: Historical Med   Order #: [id number] Class: Historical Med   Order #: [id number] Class: Historical Med   Order #: [id number] Class: Historical Med   Order #: [id number] Class: Historical Med   Order #: [id number] Class: Historical Med   Order #: [id number] Class: Historical Med   Order #: [id number] Class: Historical Med   Order #: [id number] Class: Historical Med   Order #: [id number] Class: Historical Med   Order #: [id number] Class: Normal   Order #: [id number] Class: Historical Med   Order #: [id number] Class: Historical Med   Order #: [id number] Class: Historical Med   Order #: [id number] Class: Historical Med    Order #: [id number] Class: Historical Med   Order #: [id number] Class: Print   Order #: [id number] Class: Print    Current Medication:  Current Facility-Administered Medications:    acetaminophen (TYLENOL) tablet 650 mg, 650 mg, Oral, Q6H PRN **OR** acetaminophen (TYLENOL) suppository 650 mg, 650 mg, Rectal, Q6H PRN, Agbata, Tochukwu, MD   albuterol (PROVENTIL) (2.5 MG/3ML) 0.083% nebulizer solution 2.5 mg, 2.5 mg, Nebulization, BID, Agbata, Tochukwu, MD   allopurinol (ZYLOPRIM) tablet 100 mg, 100 mg, Oral, Daily, Agbata, Tochukwu, MD   Ampicillin-Sulbactam (UNASYN) 3 g in sodium chloride 0.9 % 100 mL IVPB, 3 g, Intravenous, Q12H, Darrick Penna, RPH   atorvastatin (LIPITOR) tablet 40 mg, 40 mg, Oral, Daily, Agbata, Tochukwu, MD   azithromycin (ZITHROMAX) 500 mg in sodium chloride 0.9 % 250 mL IVPB, 500 mg, Intravenous, Once, Bradler, Vista Lawman, MD, Last Rate: 250 mL/hr at 04/30/22 1432, 500 mg at 04/30/22 1432   digoxin (LANOXIN) tablet 0.125 mg, 0.125 mg, Oral, Daily, Agbata, Tochukwu, MD   guaiFENesin (MUCINEX) 12 hr tablet 1,200 mg, 1,200 mg, Oral, BID, Agbata, Tochukwu, MD   ipratropium-albuterol (DUONEB) 0.5-2.5 (3) MG/3ML nebulizer solution 3 mL, 3 mL, Nebulization, Q6H PRN, Agbata, Tochukwu, MD   lactated ringers infusion, , Intravenous, Continuous, Agbata, Tochukwu, MD   levothyroxine (SYNTHROID) tablet 25 mcg, 25 mcg, Oral, Daily, Agbata, Tochukwu, MD   methylPREDNISolone sodium succinate (SOLU-MEDROL) 40 mg/mL injection 40 mg, 40 mg, Intravenous, Q12H, Agbata, Tochukwu, MD   metoprolol succinate (TOPROL-XL) 24 hr tablet 50 mg, 50 mg, Oral, Daily, Agbata, Tochukwu, MD   metroNIDAZOLE (FLAGYL) IVPB 500 mg, 500 mg, Intravenous, Once, Bradler, Vista Lawman, MD, Last Rate: 100 mL/hr at 04/30/22 1418, 500 mg at 04/30/22 1418   Multi-Vitamin 1 tablet, 1 tablet, Oral, Daily, Agbata, Tochukwu, MD   ondansetron (ZOFRAN) tablet 4 mg, 4 mg, Oral, Q6H PRN **OR** ondansetron (ZOFRAN) injection 4 mg, 4 mg,  Intravenous, Q6H PRN, Agbata, Tochukwu, MD   pantoprazole (PROTONIX) EC tablet 40 mg, 40 mg, Oral, Daily, Agbata, Tochukwu, MD   potassium chloride (KLOR-CON) CR tablet 10 mEq, 10 mEq, Oral, Daily, Agbata, Tochukwu, MD   sodium chloride HYPERTONIC 3 % nebulizer solution 4 mL, 4 mL, Nebulization, BID, Agbata, Tochukwu, MD   torsemide (DEMADEX) tablet 20 mg, 20 mg, Oral, BID, Agbata, Tochukwu, MD  Current Outpatient Medications:    albuterol (ACCUNEB) 1.25 MG/3ML nebulizer solution, Take 1 ampule by nebulization every 6 (six) hours as needed for wheezing., Disp: , Rfl:    allopurinol (ZYLOPRIM) 100 MG tablet, Take 100 mg by mouth daily., Disp: , Rfl:    atorvastatin (LIPITOR) 40 MG tablet, Take 40 mg by mouth daily., Disp: , Rfl: 2   azelastine (ASTELIN) 0.1 % nasal spray, Place 1 spray into both nostrils daily. (Patient not taking: Reported on 09/06/2021), Disp: , Rfl:    azelastine (ASTELIN) 0.1 %  nasal spray, Place into the nose. (Patient not taking: Reported on 09/06/2021), Disp: , Rfl:    calcium carbonate (OSCAL) 1500 (600 Ca) MG TABS tablet, Take 1 tablet by mouth in the morning and at bedtime., Disp: , Rfl:    Cholecalciferol 50 MCG (2000 UT) CAPS, Take 2,000 Units by mouth daily., Disp: , Rfl:    digoxin (LANOXIN) 0.125 MG tablet, Take by mouth., Disp: , Rfl:    ELIQUIS 2.5 MG TABS tablet, Take 2.5 mg by mouth 2 (two) times daily., Disp: , Rfl:    esomeprazole (NEXIUM) 40 MG capsule, Take by mouth., Disp: , Rfl:    levothyroxine (SYNTHROID) 25 MCG tablet, Take 25 mcg by mouth daily., Disp: , Rfl:    metoprolol succinate (TOPROL-XL) 50 MG 24 hr tablet, Take 1 tablet (50 mg total) by mouth daily. Take with or immediately following a meal., Disp: 30 tablet, Rfl: 1   Multiple Vitamin (MULTI-VITAMIN) tablet, Take 1 tablet by mouth daily., Disp: , Rfl:    omeprazole (PRILOSEC) 40 MG capsule, Take 40 mg by mouth daily.  (Patient not taking: Reported on 09/06/2021), Disp: , Rfl:    polyethylene glycol  powder (GLYCOLAX/MIRALAX) 17 GM/SCOOP powder, Take by mouth. (Patient not taking: Reported on 09/06/2021), Disp: , Rfl:    polyvinyl alcohol (LIQUIFILM TEARS) 1.4 % ophthalmic solution, Apply to eye. (Patient not taking: Reported on 09/06/2021), Disp: , Rfl:    potassium chloride (KLOR-CON) 10 MEQ tablet, Take 10 mEq by mouth daily., Disp: , Rfl:    potassium chloride SA (KLOR-CON M) 20 MEQ tablet, Take 1 tablet (20 mEq total) by mouth 2 (two) times daily. (Patient not taking: Reported on 12/27/2021), Disp: 60 tablet, Rfl: 0   torsemide (DEMADEX) 20 MG tablet, Take 1 tablet (20 mg total) by mouth 2 (two) times daily., Disp: 60 tablet, Rfl: 0  Facility-Administered Medications Ordered in Other Encounters:    epoetin alfa-epbx (RETACRIT) injection 40,000 Units, 40,000 Units, Subcutaneous, Q21 days, Sindy Guadeloupe, MD, 40,000 Units at 09/06/21 1425    ALLERGIES   Keflex [cephalexin] and Macrodantin [nitrofurantoin macrocrystal]     REVIEW OF SYSTEMS    Review of Systems:  Gen:  Denies  fever, sweats, chills weigh loss  HEENT: Denies blurred vision, double vision, ear pain, eye pain, hearing loss, nose bleeds, sore throat Cardiac:  No dizziness, chest pain or heaviness, chest tightness,edema Resp:   reports dyspnea chronically  Gi: Denies swallowing difficulty, stomach pain, nausea or vomiting, diarrhea, constipation, bowel incontinence Gu:  Denies bladder incontinence, burning urine Ext:   Denies Joint pain, stiffness or swelling Skin: Denies  skin rash, easy bruising or bleeding or hives Endoc:  Denies polyuria, polydipsia , polyphagia or weight change Psych:   Denies depression, insomnia or hallucinations   Other:  All other systems negative   VS: BP (!) 140/84   Pulse (!) 116   Temp 98.8 F (37.1 C)   Resp 16   SpO2 97%      PHYSICAL EXAM    GENERAL:NAD, no fevers, chills, no weakness no fatigue HEAD: Normocephalic, atraumatic.  EYES: Pupils equal, round, reactive to  light. Extraocular muscles intact. No scleral icterus.  MOUTH: Moist mucosal membrane. Dentition intact. No abscess noted.  EAR, NOSE, THROAT: Clear without exudates. No external lesions.  NECK: Supple. No thyromegaly. No nodules. No JVD.  PULMONARY: decreased breath sounds with mild rhonchi worse at bases bilaterally.  CARDIOVASCULAR: S1 and S2. Regular rate and rhythm. No murmurs, rubs, or gallops. No  edema. Pedal pulses 2+ bilaterally.  GASTROINTESTINAL: Soft, nontender, nondistended. No masses. Positive bowel sounds. No hepatosplenomegaly.  MUSCULOSKELETAL: No swelling, clubbing, or edema. Range of motion full in all extremities.  NEUROLOGIC: Cranial nerves II through XII are intact. No gross focal neurological deficits. Sensation intact. Reflexes intact.  SKIN: No ulceration, lesions, rashes, or cyanosis. Skin warm and dry. Turgor intact.  PSYCHIATRIC: Mood, affect within normal limits. The patient is awake, alert and oriented x 3. Insight, judgment intact.       IMAGING     ASSESSMENT/PLAN   Acute hypoxemic respiratory failure - present on admission  - COVID19 negative -RSV +  - supplemental O2 during my evaluation 2L/[place] - patient denies chest pain d/c planning  -please encourage patient to use incentive spirometer few times each hour while hospitalized.   -can dc IV Zithromax, and IV Rocephin and Flagyl, and can do PO augmentin. Can DC soumedrol and continue prednisone with medrol dose pack       Thank you for allowing me to participate in the care of this patient.   Patient/Family are satisfied with care plan and all questions have been answered.    Provider disclosure: Patient with at least one acute or chronic illness or injury that poses a threat to life or bodily function and is being managed actively during this encounter.  All of the below services have been performed independently by signing provider:  review of prior documentation from internal and or  external health records.  Review of previous and current lab results.  Interview and comprehensive assessment during patient visit today. Review of current and previous chest radiographs/CT scans. Discussion of management and test interpretation with health care team and patient/family.   This document was prepared using Dragon voice recognition software and may include unintentional dictation errors.     Ottie Glazier, M.D.  Division of Pulmonary & Critical Care Medicine

## 2022-04-30 NOTE — ED Triage Notes (Signed)
First Nurse Note;  Pt via EMS from [facility]. Pt tested positive RSV and PNA on Wednesday, she was started on Zpack on Wednesday. Pt continuously coughing, EMS reports 87% on RA, EMS placed pt on 2L and O2 increased 96%. Pt is A&Ox4 and NAD  160/80 120-140 A Fib hx  96% on 2L [place]

## 2022-04-30 NOTE — Assessment & Plan Note (Addendum)
Patient with a history of permanent A-fib noted to be tachycardic with A-fib due to acute respiratory illness Continue metoprolol and digoxin for rate control Obtain digoxin levels Eliquis is currently on hold for possible procedure

## 2022-04-30 NOTE — H&P (Addendum)
History and Physical    Patient: Allison Novak BZJ:[id number] DOB: 02-17-1926 DOA: 04/30/2022 DOS: the patient was seen and examined on 04/30/2022 PCP: Adin Hector, MD  Patient coming from: Assisted living  Chief Complaint:  Chief Complaint  Patient presents with   Shortness of Breath   HPI: Allison Novak is a 88 y.o. female with medical history significant for permanent A-fib, chronic diastolic dysfunction CHF last known LVEF of 55 to 60%, history of coronary artery disease, stage III chronic kidney disease, hypertension who presents to the ER from home [facility] [place] for evaluation of worsening shortness of breath and a refractory cough which is wet sounding and raspy. Patient tested positive for RSV on 04/27/22 and has been on a Z-Pak for presumed pneumonia as well as a steroid taper.  Her symptoms have not improved and her cough is worse as well as shortness of breath and so EMS was called and she was found to have room air pulse oximetry of 87%.  She was placed on 2 L of oxygen with improvement in her pulse oximetry to 96%. She denies having any chest pain, no nausea, no vomiting, no dizziness, no lightheadedness, no fever, no chills, no leg swelling, no palpitations, no blurred vision no focal deficit. Per ER physician she had a Tmax of 101 in the field and received Tylenol, she is tachycardic and tachypneic Chest x-ray reviewed by me shows right parahilar opacity may reflect pneumonia although central lung lesion not excluded. Right base collapse/consolidation again noted with small right pleural effusion.  Moderate hiatal hernia.  CT imaging CTA showed no evidence of pulmonary embolus. Endobronchial debris seen in the bilateral lower lobes and right middle lobe, causing complete right middle lobe collapse, findings are likely due to aspiration. Bronchial wall thickening, mosaic attenuation and new scattered bilateral patchy consolidations and small nodular opacities,  likely due to infectious bronchitis. Recommend follow-up chest CT in 3 months to ensure resolution. Moderate right pleural effusion with associated atelectasis. Cardiomegaly with severe left main and three-vessel coronary artery calcifications. Large hiatal hernia. Aortic Atherosclerosis Twelve-lead EKG reviewed by me shows rapid A-fib She received a dose of Rocephin, Zithromax and Flagyl and will be admitted to the hospital for further evaluation.  Review of Systems: As mentioned in the history of present illness. All other systems reviewed and are negative. Past Medical History:  Diagnosis Date   A-fib ([place])    Anemia    Carotid artery stenosis    CHF (congestive heart failure) (HCC)    Chronic kidney disease    Coronary artery disease    Diverticulosis    GERD (gastroesophageal reflux disease)    Gout    Hiatal hernia    Hyperlipidemia    Hypertension    Osteoporosis    Pulmonary embolus ([place])    Past Surgical History:  Procedure Laterality Date   ABDOMINAL HYSTERECTOMY     ANKLE SURGERY Left 2011   KNEE ARTHROSCOPY Right    Social History:  reports that she has never smoked. She has never used smokeless tobacco. She reports that she does not drink alcohol and does not use drugs.  Allergies  Allergen Reactions   Keflex [Cephalexin] Other (See Comments)    Pt cant remember exact reaction   Macrodantin [Nitrofurantoin Macrocrystal] Other (See Comments)    Family History  Problem Relation Age of Onset   CAD Mother    Hypertension Sister    COPD Brother     Prior to  Admission medications   Medication Sig Start Date End Date Taking? Authorizing Provider  albuterol (ACCUNEB) 1.25 MG/3ML nebulizer solution Take 1 ampule by nebulization every 6 (six) hours as needed for wheezing.    [provider]  allopurinol (ZYLOPRIM) 100 MG tablet Take 100 mg by mouth daily.    [provider]  atorvastatin (LIPITOR) 40 MG tablet Take 40 mg by mouth daily. 11/18/16    [provider]  azelastine (ASTELIN) 0.1 % nasal spray Place 1 spray into both nostrils daily. Patient not taking: Reported on 09/06/2021 06/15/20   [provider]  azelastine (ASTELIN) 0.1 % nasal spray Place into the nose. Patient not taking: Reported on 09/06/2021    [provider]  calcium carbonate (OSCAL) 1500 (600 Ca) MG TABS tablet Take 1 tablet by mouth in the morning and at bedtime.    [provider]  Cholecalciferol 50 MCG (2000 UT) CAPS Take 2,000 Units by mouth daily.    [provider]  digoxin (LANOXIN) 0.125 MG tablet Take by mouth. 08/27/21   [provider]  ELIQUIS 2.5 MG TABS tablet Take 2.5 mg by mouth 2 (two) times daily. 02/13/20   [provider]  esomeprazole (NEXIUM) 40 MG capsule Take by mouth.    [provider]  levothyroxine (SYNTHROID) 25 MCG tablet Take 25 mcg by mouth daily. 07/14/21   [provider]  metoprolol succinate (TOPROL-XL) 50 MG 24 hr tablet Take 1 tablet (50 mg total) by mouth daily. Take with or immediately following a meal. 06/11/21   Wouk, Ailene Rud, MD  Multiple Vitamin (MULTI-VITAMIN) tablet Take 1 tablet by mouth daily.    [provider]  omeprazole (PRILOSEC) 40 MG capsule Take 40 mg by mouth daily.  Patient not taking: Reported on 09/06/2021    [provider]  polyethylene glycol powder (GLYCOLAX/MIRALAX) 17 GM/SCOOP powder Take by mouth. Patient not taking: Reported on 09/06/2021 07/24/21   [provider]  polyvinyl alcohol (LIQUIFILM TEARS) 1.4 % ophthalmic solution Apply to eye. Patient not taking: Reported on 09/06/2021 07/26/21   [provider]  potassium chloride (KLOR-CON) 10 MEQ tablet Take 10 mEq by mouth daily. 11/29/21   [provider]  potassium chloride SA (KLOR-CON M) 20 MEQ tablet Take 1 tablet (20 mEq total) by mouth 2 (two) times daily. Patient not taking: Reported on 12/27/2021 07/25/21   Loletha Grayer, MD   torsemide (DEMADEX) 20 MG tablet Take 1 tablet (20 mg total) by mouth 2 (two) times daily. 07/24/21   Loletha Grayer, MD    Physical Exam: Vitals:   04/30/22 1100 04/30/22 1155 04/30/22 1200 04/30/22 1400  BP: 136/80  (!) 140/84   Pulse: (!) 109 (!) 116 (!) 116   Resp: (!) '25 16 16   '$ Temp:    98.8 F (37.1 C)  TempSrc:      SpO2: 97% 97% 97%    Physical Exam Vitals and nursing note reviewed.  Constitutional:      Comments: Acutely ill-appearing  HENT:     Head: Normocephalic and atraumatic.     Mouth/Throat:     Mouth: Mucous membranes are moist.  Eyes:     Pupils: Pupils are equal, round, and reactive to light.  Cardiovascular:     Rate and Rhythm: Tachycardia present. Rhythm irregular.  Pulmonary:     Effort: Tachypnea present.     Breath sounds: Examination of the right-upper field reveals rhonchi. Examination of the left-upper field reveals rhonchi. Examination of  the right-middle field reveals rhonchi. Examination of the left-middle field reveals rhonchi. Examination of the right-lower field reveals rhonchi. Examination of the left-lower field reveals rhonchi. Rhonchi present.  Abdominal:     General: Bowel sounds are normal.     Palpations: Abdomen is soft.  Musculoskeletal:     Cervical back: Normal range of motion and neck supple.  Skin:    General: Skin is warm and dry.  Neurological:     General: No focal deficit present.     Mental Status: She is alert.     Motor: Weakness present.  Psychiatric:        Mood and Affect: Mood normal.        Behavior: Behavior normal.     Data Reviewed: Data Reviewed: Relevant notes from primary care and specialist visits, past discharge summaries as available in EHR, including Care Everywhere. Prior diagnostic testing as pertinent to current admission diagnoses Updated medications and problem lists for reconciliation ED course, including vitals, labs, imaging, treatment and response to treatment Triage notes, nursing  and pharmacy notes and ED provider's notes Notable results as noted in HPI Labs reviewed.  BNP 275, sodium 134, potassium 3.5, chloride 94, bicarb 29, glucose 113, BUN 25, creatinine 1.17, calcium 8.9, troponin 12, white count 9.0, hemoglobin 13, hematocrit 38.9, platelet count 160 There are no new results to review at this time.  Assessment and Plan: * Sepsis due to pneumonia [facility]) As evidenced by fever, tachycardia, tachypnea and imaging concerning for aspiration pneumonia. Lactic acid is still pending Judicious IV fluid resuscitation due to patient's history of CHF Place patient on Unasyn Follow-up results of blood and sputum culture  Acute respiratory failure with hypoxia ([place]) Secondary to aspiration pneumonia and RSV bronchitis Patient was brought into the ER for evaluation of worsening shortness of breath and was found to have room air pulse oximetry of 87% requiring oxygen supplementation at 2 L to improve pulse oximetry to greater than 92% We will attempt to wean patient off oxygen once acute illness improves or resolves  Chronic diastolic CHF (congestive heart failure) ([place]) Not acutely exacerbated Last known LVEF of 55 to 60% from a 2D echocardiogram which was done 03/23 Continue torsemide and metoprolol  Acute bronchitis due to respiratory syncytial virus (RSV) Patient tested positive for RSV on 04/27/22 and has been on prednisone as well as Zithromax with no improvement in her symptoms She was brought to the ER for evaluation of respiratory distress associated with a wet sounding raspy cough and hypoxia Imaging shows findings suggestive of bronchitis Place patient on scheduled and as needed bronchodilator therapy Start patient on Solu-Medrol 40 mg IV every 12 Continue oxygen supplementation to maintain pulse oximetry greater than 92%  Aspiration pneumonia ([place]) Patient presents for evaluation of worsening shortness of breath associated with cough and fever. Imaging shows  endobronchial debris seen in the bilateral lower lobes and right middle lobe, causing complete right middle lobe collapse, findings are likely due to aspiration. Start patient on Unasyn Strict aspiration precautions Speech therapy for swallow function evaluation Pulmonary consult  Stage 3a chronic kidney disease (CKD) ([place]) Secondary to hypertension Renal function is stable Monitor closely during this hospitalization  Hypothyroidism Continue Synthroid  Pulmonary embolism (HCC) Continue Eliquis  Atrial fibrillation with RVR ([place]) Patient with a history of permanent A-fib noted to be tachycardic with A-fib due to acute respiratory illness Continue metoprolol and digoxin for rate control Obtain digoxin levels Eliquis is currently on hold for possible procedure  Essential  hypertension Continue metoprolol  Hiatal hernia with GERD without esophagitis Continue PPI      Advance Care Planning:   Code Status: DNR   Consults: Pulmonary  Family Communication: Greater than 50% of time was spent discussing patient's condition and plan of care with her and her son at the bedside.  All questions and concerns have been addressed.  They verbalized understanding and agree with the plan.  CODE STATUS was discussed and she is a DNR.  Severity of Illness: The appropriate patient status for this patient is INPATIENT. Inpatient status is judged to be reasonable and necessary in order to provide the required intensity of service to ensure the patient's safety. The patient's presenting symptoms, physical exam findings, and initial radiographic and laboratory data in the context of their chronic comorbidities is felt to place them at high risk for further clinical deterioration. Furthermore, it is not anticipated that the patient will be medically stable for discharge from the hospital within 2 midnights of admission.   * I certify that at the point of admission it is my clinical judgment that the  patient will require inpatient hospital care spanning beyond 2 midnights from the point of admission due to high intensity of service, high risk for further deterioration and high frequency of surveillance required.*  Author: Collier Bullock, MD 04/30/2022 2:47 PM  For on call review [email or web address].

## 2022-04-30 NOTE — Assessment & Plan Note (Signed)
Secondary to aspiration pneumonia and RSV bronchitis Patient was brought into the ER for evaluation of worsening shortness of breath and was found to have room air pulse oximetry of 87% requiring oxygen supplementation at 2 L to improve pulse oximetry to greater than 92% We will attempt to wean patient off oxygen once acute illness improves or resolves

## 2022-04-30 NOTE — Assessment & Plan Note (Signed)
Secondary to hypertension Renal function is stable Monitor closely during this hospitalization 

## 2022-04-30 NOTE — Assessment & Plan Note (Signed)
Patient tested positive for RSV on 04/27/22 and has been on prednisone as well as Zithromax with no improvement in her symptoms She was brought to the ER for evaluation of respiratory distress associated with a wet sounding raspy cough and hypoxia Imaging shows findings suggestive of bronchitis Place patient on scheduled and as needed bronchodilator therapy Start patient on Solu-Medrol 40 mg IV every 12 Continue oxygen supplementation to maintain pulse oximetry greater than 92%

## 2022-04-30 NOTE — Assessment & Plan Note (Signed)
As evidenced by fever, tachycardia, tachypnea and imaging concerning for aspiration pneumonia. Lactic acid is still pending Judicious IV fluid resuscitation due to patient's history of CHF Place patient on Unasyn Follow-up results of blood and sputum culture

## 2022-04-30 NOTE — Assessment & Plan Note (Signed)
Continue Synthroid °

## 2022-04-30 NOTE — Assessment & Plan Note (Signed)
Continue metoprolol. 

## 2022-04-30 NOTE — Assessment & Plan Note (Signed)
Not acutely exacerbated Last known LVEF of 55 to 60% from a 2D echocardiogram which was done 03/23 Continue torsemide and metoprolol

## 2022-04-30 NOTE — ED Triage Notes (Signed)
Pt to ED via [facility] from [place]. Pt was dx with RSV and pna on Wednesday. Pt was prescribed zpack and prednisone. Pt does not use oxygen normally. RA sats 87% and pt placed on 2L [place]. Pt HR 138 with hx afib.

## 2022-04-30 NOTE — Assessment & Plan Note (Signed)
Patient presents for evaluation of worsening shortness of breath associated with cough and fever. Imaging shows endobronchial debris seen in the bilateral lower lobes and right middle lobe, causing complete right middle lobe collapse, findings are likely due to aspiration. Start patient on Unasyn Strict aspiration precautions Speech therapy for swallow function evaluation Pulmonary consult

## 2022-04-30 NOTE — Assessment & Plan Note (Signed)
Continue PPI ?

## 2022-04-30 NOTE — Assessment & Plan Note (Signed)
-   Continue Eliquis 

## 2022-04-30 NOTE — ED Provider Notes (Signed)
[facility] Provider Note   Event Date/Time   First MD Initiated Contact with Patient 04/30/22 1056     (approximate) History  Shortness of Breath  HPI Allison Novak is a 88 y.o. female with recent diagnosed RSV and pneumonia 4 days prior to arrival who presents with worsening shortness of breath.  Patient also concerned that she has been continuously coughing but has been unable to produce any sputum.  EMS reports patient was 87% on room air before being placed on 2 L nasal cannula with improvement to 96.  Patient states that she feels much better on nasal cannula.  Patient also endorses history of persistent atrial fibrillation for which she takes Eliquis. ROS: Patient currently denies any vision changes, tinnitus, difficulty speaking, facial droop, sore throat, chest pain, abdominal pain, nausea/vomiting/diarrhea, dysuria, or weakness/numbness/paresthesias in any extremity   Physical Exam  Triage Vital Signs: ED Triage Vitals [04/30/22 1045]  Enc Vitals Group     BP (!) 156/101     Pulse Rate (!) 135     Resp 18     Temp 98.6 F (37 C)     Temp Source Oral     SpO2 96 %     Weight      Height      Head Circumference      Peak Flow      Pain Score 0     Pain Loc      Pain Edu?      Excl. in [place]?    Most recent vital signs: Vitals:   04/30/22 1200 04/30/22 1400  BP: (!) 140/84   Pulse: (!) 116   Resp: 16   Temp:  98.8 F (37.1 C)  SpO2: 97%    General: Awake, oriented x4. CV:  Good peripheral perfusion.  Resp:  Increased effort.  Rales over bilateral lung fields with 2 L nasal cannula in place Abd:  No distention.  Other:  Elderly overweight Caucasian female laying in bed in no respiratory distress distress. ED Results / Procedures / Treatments  Labs (all labs ordered are listed, but only abnormal results are displayed) Labs Reviewed  BASIC METABOLIC PANEL - Abnormal; Notable for the following components:      Result Value   Sodium 134  (*)    Chloride 94 (*)    Glucose, Bld 113 (*)    BUN 25 (*)    Creatinine, Ser 1.17 (*)    GFR, Estimated 43 (*)    All other components within normal limits  BRAIN NATRIURETIC PEPTIDE - Abnormal; Notable for the following components:   B Natriuretic Peptide 275.2 (*)    All other components within normal limits  CULTURE, BLOOD (ROUTINE X 2)  CULTURE, BLOOD (ROUTINE X 2)  CBC  LACTIC ACID, PLASMA  LACTIC ACID, PLASMA  DIGOXIN LEVEL  TROPONIN I (HIGH SENSITIVITY)  TROPONIN I (HIGH SENSITIVITY)   EKG ED ECG REPORT I, Naaman Plummer, the attending physician, personally viewed and interpreted this ECG. Date: 04/30/2022 EKG Time: 1052 Rate: 117 Rhythm: Atrial fibrillation with rapid ventricular response QRS Axis: normal Intervals: normal ST/T Wave abnormalities: normal Narrative Interpretation: no evidence of acute ischemia RADIOLOGY ED MD interpretation: CT angiography of the chest interpreted independently by me shows bilateral mosaic attenuation with scattered endobronchial debris concerning for aspiration pneumonia as well as bronchial wall thickening concerning for RSV bronchitis versus bronchiolitis  2 view chest x-ray interpreted by me shows right perihilar opacity likely reflecting pneumonia  as well as a right lung base collapse versus atelectasis -Agree with radiology assessment Official radiology report(s): CT Angio Chest PE W/Cm &/Or Wo Cm  Result Date: 04/30/2022 CLINICAL DATA:  Cough and shortness of breath EXAM: CT ANGIOGRAPHY CHEST WITH CONTRAST TECHNIQUE: Multidetector CT imaging of the chest was performed using the standard protocol during bolus administration of intravenous contrast. Multiplanar CT image reconstructions and MIPs were obtained to evaluate the vascular anatomy. RADIATION DOSE REDUCTION: This exam was performed according to the departmental dose-optimization program which includes automated exposure control, adjustment of the mA and/or kV according  to patient size and/or use of iterative reconstruction technique. CONTRAST:  69m OMNIPAQUE IOHEXOL 350 MG/ML SOLN COMPARISON:  Chest CT dated June 07, 2019; PET-CT dated August 13, 2021 FINDINGS: Cardiovascular: No evidence of pulmonary embolus. Cardiomegaly. Trace pericardial effusion. Normal caliber thoracic aorta with severe atherosclerotic disease. Severe left main and three-vessel coronary artery calcifications. Dilated main pulmonary artery, measuring up to 3.4 cm. Mediastinum/Nodes: Large hiatal hernia. Unchanged mildly enlarged mediastinal lymph nodes, reference precarinal lymph node measuring 10 mm in short axis on series 4, image 51, likely reactive. Lungs/Pleura: Bilateral bronchial wall thickening. Bilateral mosaic attenuation. Scattered endobronchial debris seen in the bilateral lower lobes and right middle lobe, which is most severe in the right middle lobe causing complete right middle lobe collapse. New scattered bilateral patchy consolidations and small nodular opacities. Reference solid nodule of the left lower lobe measuring 5 mm on series 6, image 209 moderate right pleural effusion with associated atelectasis. Upper Abdomen: No acute abnormality. Musculoskeletal: No chest wall abnormality. No acute or significant osseous findings. Review of the MIP images confirms the above findings. IMPRESSION: 1. No evidence of pulmonary embolus. 2. Endobronchial debris seen in the bilateral lower lobes and right middle lobe, causing complete right middle lobe collapse, findings are likely due to aspiration. 3. Bronchial wall thickening, mosaic attenuation and new scattered bilateral patchy consolidations and small nodular opacities, likely due to infectious bronchitis. Recommend follow-up chest CT in 3 months to ensure resolution. 4. Moderate right pleural effusion with associated atelectasis. 5. Cardiomegaly with severe left main and three-vessel coronary artery calcifications. 6. Large hiatal hernia. 7.  Aortic Atherosclerosis (ICD10-I70.0). Electronically Signed   By: LYetta GlassmanM.D.   On: 04/30/2022 12:45   DG Chest 2 View  Result Date: 04/30/2022 CLINICAL DATA:  Chest pain EXAM: CHEST - 2 VIEW COMPARISON:  07/23/2021 FINDINGS: Lowi 14 hours. The cardio pericardial silhouette is enlarged. Interstitial markings are diffusely coarsened with chronic features. Right parahilar opacity may reflect pneumonia although central lung lesion not excluded. There is persistent right base collapse/consolidation with small right pleural effusion. Moderate hiatal hernia noted. Bones are diffusely demineralized. Telemetry leads overlie the chest. IMPRESSION: 1. Right parahilar opacity may reflect pneumonia although central lung lesion not excluded. 2. Right base collapse/consolidation again noted with small right pleural effusion. 3. Moderate hiatal hernia. Electronically Signed   By: EMisty StanleyM.D.   On: 04/30/2022 11:29   PROCEDURES: Critical Care performed: Yes, see critical care procedure note(s) .1-3 Lead EKG Interpretation  Performed by: BNaaman Plummer MD Authorized by: BNaaman Plummer MD     Interpretation: abnormal     ECG rate:  121   ECG rate assessment: tachycardic     Rhythm: atrial fibrillation     Ectopy: none     Conduction: normal   CRITICAL CARE Performed by: ENaaman Plummer Total critical care time: 31 minutes  Critical care time  was exclusive of separately billable procedures and treating other patients.  Critical care was necessary to treat or prevent imminent or life-threatening deterioration.  Critical care was time spent personally by me on the following activities: development of treatment plan with patient and/or surrogate as well as nursing, discussions with consultants, evaluation of patient's response to treatment, examination of patient, obtaining history from patient or surrogate, ordering and performing treatments and interventions, ordering and review of  laboratory studies, ordering and review of radiographic studies, pulse oximetry and re-evaluation of patient's condition.  MEDICATIONS ORDERED IN ED: Medications  allopurinol (ZYLOPRIM) tablet 100 mg (has no administration in time range)  atorvastatin (LIPITOR) tablet 40 mg (has no administration in time range)  digoxin (LANOXIN) tablet 0.125 mg (has no administration in time range)  metoprolol succinate (TOPROL-XL) 24 hr tablet 50 mg (has no administration in time range)  levothyroxine (SYNTHROID) tablet 25 mcg (has no administration in time range)  pantoprazole (PROTONIX) EC tablet 40 mg (has no administration in time range)  Multi-Vitamin 1 tablet (has no administration in time range)  potassium chloride (KLOR-CON) CR tablet 10 mEq (has no administration in time range)  acetaminophen (TYLENOL) tablet 650 mg (has no administration in time range)    Or  acetaminophen (TYLENOL) suppository 650 mg (has no administration in time range)  ondansetron (ZOFRAN) tablet 4 mg (has no administration in time range)    Or  ondansetron (ZOFRAN) injection 4 mg (has no administration in time range)  guaiFENesin (MUCINEX) 12 hr tablet 1,200 mg (1,200 mg Oral Given 04/30/22 1515)  ipratropium-albuterol (DUONEB) 0.5-2.5 (3) MG/3ML nebulizer solution 3 mL (has no administration in time range)  sodium chloride HYPERTONIC 3 % nebulizer solution 4 mL (has no administration in time range)  methylPREDNISolone (MEDROL DOSEPAK) tablet 8 mg (has no administration in time range)  methylPREDNISolone (MEDROL DOSEPAK) tablet 4 mg (has no administration in time range)  methylPREDNISolone (MEDROL DOSEPAK) tablet 4 mg (has no administration in time range)  methylPREDNISolone (MEDROL DOSEPAK) tablet 8 mg (has no administration in time range)  methylPREDNISolone (MEDROL DOSEPAK) tablet 4 mg (has no administration in time range)  methylPREDNISolone (MEDROL DOSEPAK) tablet 8 mg (has no administration in time range)   methylPREDNISolone (MEDROL DOSEPAK) tablet 4 mg (has no administration in time range)  amoxicillin-clavulanate (AUGMENTIN) 875-125 MG per tablet 1 tablet (has no administration in time range)  iohexol (OMNIPAQUE) 350 MG/ML injection 75 mL (75 mLs Intravenous Contrast Given 04/30/22 1158)  cefTRIAXone (ROCEPHIN) 1 g in sodium chloride 0.9 % 100 mL IVPB (0 g Intravenous Stopped 04/30/22 1425)   IMPRESSION / MDM / ASSESSMENT AND PLAN / ED COURSE  I reviewed the triage vital signs and the nursing notes.                             The patient is on the cardiac monitor to evaluate for evidence of arrhythmia and/or significant heart rate changes. Patient's presentation is most consistent with acute presentation with potential threat to life or bodily function. Presentation most consistent with Viral Syndrome.  Patient has tested positive for RSV as well as shows radiologic evidence of aspiration pneumonia At this time patient is requiring submental oxygenation due to acute hypoxic respiratory failure.  Given History and Exam I have a lower suspicion for: Emergent CardioPulmonary causes [such as Acute Asthma or COPD Exacerbation, acute Heart Failure or exacerbation, PE, PTX, atypical ACS, PNA]. Emergent Otolaryngeal causes [such as PTA, RPA,  [place], Epiglottitis, EBV].  Regarding Emergent Travel or Immunosuppressive related infectious: I have a low suspicion for acute HIV. Covered with Rocephin, Flagyl, and azithromycin Given radiologic evidence for patchy bilateral airspace opacities concerning for viral pneumonia, continued need for supplemental oxygenation due to acute hypoxic respiratory failure, and need for further evaluation and management, patient will require admission   FINAL CLINICAL IMPRESSION(S) / ED DIAGNOSES   Final diagnoses:  Acute hypoxic respiratory failure ([place])  RSV bronchitis  Sepsis with acute hypoxic respiratory failure without septic shock, due to unspecified organism  [facility])   Rx / DC Orders   ED Discharge Orders     None      Note:  This document was prepared using Dragon voice recognition software and may include unintentional dictation errors.   Naaman Plummer, MD 04/30/22 [phone]

## 2022-04-30 NOTE — Consult Note (Signed)
Pharmacy Antibiotic Note  Allison Novak is a 88 y.o. female admitted on 04/30/2022 with  aspiration pnuemonia .  Pharmacy has been consulted for unasyn dosing. Last documented weight of 60kg and 62.0 in makes CrCl of 27 ml/min.  Plan: Unasyn 3g IV every 12 hours Continue to monitor and dose adjust antibiotics according to renal function and indication      Temp (24hrs), Avg:98.7 F (37.1 C), Min:98.6 F (37 C), Max:98.8 F (37.1 C)  Recent Labs  Lab 04/30/22 1047 04/30/22 1403  WBC 9.0  --   CREATININE 1.17*  --   LATICACIDVEN  --  1.1    CrCl cannot be calculated (Unknown ideal weight.).    Allergies  Allergen Reactions   Keflex [Cephalexin] Other (See Comments)    Pt cant remember exact reaction   Macrodantin [Nitrofurantoin Macrocrystal] Other (See Comments)    Antimicrobials this admission: 12/23 rocpehin +flagyl x1 12/23 Unasyn >>   Microbiology results: 12/23 BCx: sent   Thank you for allowing pharmacy to be a part of this patient's care.  Darrick Penna 04/30/2022 2:47 PM

## 2022-05-01 DIAGNOSIS — J9601 Acute respiratory failure with hypoxia: Secondary | ICD-10-CM | POA: Diagnosis not present

## 2022-05-01 DIAGNOSIS — I5032 Chronic diastolic (congestive) heart failure: Secondary | ICD-10-CM | POA: Diagnosis not present

## 2022-05-01 DIAGNOSIS — A419 Sepsis, unspecified organism: Secondary | ICD-10-CM | POA: Diagnosis not present

## 2022-05-01 DIAGNOSIS — J189 Pneumonia, unspecified organism: Secondary | ICD-10-CM | POA: Diagnosis not present

## 2022-05-01 LAB — CBC
HCT: 35.8 % — ABNORMAL LOW (ref 36.0–46.0)
Hemoglobin: 11.6 g/dL — ABNORMAL LOW (ref 12.0–15.0)
MCH: 32.8 pg (ref 26.0–34.0)
MCHC: 32.4 g/dL (ref 30.0–36.0)
MCV: 101.1 fL — ABNORMAL HIGH (ref 80.0–100.0)
Platelets: 144 10*3/uL — ABNORMAL LOW (ref 150–400)
RBC: 3.54 MIL/uL — ABNORMAL LOW (ref 3.87–5.11)
RDW: 13.6 % (ref 11.5–15.5)
WBC: 5.8 10*3/uL (ref 4.0–10.5)
nRBC: 0 % (ref 0.0–0.2)

## 2022-05-01 LAB — BASIC METABOLIC PANEL
Anion gap: 6 (ref 5–15)
BUN: 19 mg/dL (ref 8–23)
CO2: 33 mmol/L — ABNORMAL HIGH (ref 22–32)
Calcium: 8.4 mg/dL — ABNORMAL LOW (ref 8.9–10.3)
Chloride: 99 mmol/L (ref 98–111)
Creatinine, Ser: 0.94 mg/dL (ref 0.44–1.00)
GFR, Estimated: 56 mL/min — ABNORMAL LOW (ref >60)
Glucose, Bld: 108 mg/dL — ABNORMAL HIGH (ref 70–99)
Potassium: 4.3 mmol/L (ref 3.5–5.1)
Sodium: 138 mmol/L (ref 135–145)

## 2022-05-01 LAB — PROTIME-INR
INR: 1.3 — ABNORMAL HIGH (ref 0.8–1.2)
Prothrombin Time: 15.8 seconds — ABNORMAL HIGH (ref 11.4–15.2)

## 2022-05-01 LAB — PROCALCITONIN: Procalcitonin: <0.1 ng/mL

## 2022-05-01 LAB — CORTISOL-AM, BLOOD: Cortisol - AM: 2.2 ug/dL — ABNORMAL LOW (ref 6.7–22.6)

## 2022-05-01 MED ORDER — GUAIFENESIN-DM 100-10 MG/5ML PO SYRP
5.0000 mL | ORAL_SOLUTION | ORAL | Status: DC | PRN
  Administered 2022-05-01 – 2022-05-03 (×6): 5 mL via ORAL
  Filled 2022-05-01 (×6): qty 10

## 2022-05-01 NOTE — Progress Notes (Signed)
Progress Note    Allison Novak  ZSW:[id number] DOB: [birth year]  DOA: 04/30/2022 PCP: Adin Hector, MD      Brief Narrative:    Medical records reviewed and are as summarized below:  Allison Novak is a 88 y.o. female  with medical history significant for permanent A-fib, chronic diastolic dysfunction CHF last known LVEF of 55 to 60%, history of coronary artery disease, stage III chronic kidney disease, hypertension who presents to the ER from home [facility] for evaluation of worsening shortness of breath and a refractory cough which is wet sounding and raspy. Patient tested positive for RSV on 04/27/22 and has been on a Z-Pak for presumed pneumonia as well as a steroid taper.  Her symptoms have not improved and her cough is worse as well as shortness of breath and so EMS was called and she was found to have room air pulse oximetry of 87%.  She was placed on 2 L of oxygen with improvement in her pulse oximetry to 96%.   She was admitted to the hospital for sepsis secondary to RSV pneumonia complicated by acute hypoxic respiratory failure.    Assessment/Plan:   Principal Problem:   Sepsis due to pneumonia [facility]) Active Problems:   Chronic diastolic CHF (congestive heart failure) (HCC)   Acute respiratory failure with hypoxia (HCC)   Acute bronchitis due to respiratory syncytial virus (RSV)   Aspiration pneumonia (HCC)   Stage 3a chronic kidney disease (CKD) ([place])   Hiatal hernia with GERD without esophagitis   Essential hypertension   Atrial fibrillation with RVR (HCC)   Pulmonary embolism (HCC)   Hypothyroidism    Body mass index is 25.38 kg/m.   Sepsis secondary to RSV bronchitis, aspiration pneumonia: Continue Augmentin and prednisone.  Encourage use of incentive spirometer.  Robitussin as needed for cough  Acute hypoxic respiratory failure: She is on 2 L minute oxygen via [place].  Taper off oxygen as able.  Chronic diastolic CHF:  Compensated.  Atrial fibrillation with RVR: Continue metoprolol, digoxin and Eliquis.   History of pulmonary embolism: Continue Eliquis  Other comorbidities include CKD stage IIIa, hypothyroidism, hypertension, hiatal hernia with GERD  Diet Order             Diet regular Room service appropriate? Yes; Fluid consistency: Thin  Diet effective now                            Consultants: Pulmonologist  Procedures: None    Medications:    allopurinol  100 mg Oral Daily   amoxicillin-clavulanate  1 tablet Oral Q12H   atorvastatin  40 mg Oral Daily   digoxin  0.125 mg Oral Daily   levothyroxine  25 mcg Oral Q0600   methylPREDNISolone  4 mg Oral 3 x daily with food   [START ON 05/02/2022] methylPREDNISolone  4 mg Oral 4X daily taper   methylPREDNISolone  8 mg Oral Nightly   metoprolol succinate  50 mg Oral Daily   multivitamin with minerals  1 tablet Oral Daily   pantoprazole  40 mg Oral Daily   potassium chloride  10 mEq Oral Daily   sodium chloride HYPERTONIC  4 mL Nebulization BID   Continuous Infusions:   Anti-infectives (From admission, onward)    Start     Dose/Rate Route Frequency Ordered Stop   04/30/22 2200  Ampicillin-Sulbactam (UNASYN) 3 g in sodium chloride 0.9 % 100 mL  IVPB  Status:  Discontinued        3 g 200 mL/hr over 30 Minutes Intravenous Every 12 hours 04/30/22 1452 04/30/22 1502   04/30/22 1515  amoxicillin-clavulanate (AUGMENTIN) 875-125 MG per tablet 1 tablet        1 tablet Oral Every 12 hours 04/30/22 1503     04/30/22 1300  cefTRIAXone (ROCEPHIN) 1 g in sodium chloride 0.9 % 100 mL IVPB        1 g 200 mL/hr over 30 Minutes Intravenous  Once 04/30/22 1254 04/30/22 1425   04/30/22 1300  metroNIDAZOLE (FLAGYL) IVPB 500 mg  Status:  Discontinued        500 mg 100 mL/hr over 60 Minutes Intravenous  Once 04/30/22 1254 04/30/22 1502   04/30/22 1300  azithromycin (ZITHROMAX) 500 mg in sodium chloride 0.9 % 250 mL IVPB  Status:   Discontinued        500 mg 250 mL/hr over 60 Minutes Intravenous  Once 04/30/22 1254 04/30/22 1502              Family Communication/Anticipated D/C date and plan/Code Status   DVT prophylaxis:      Code Status: DNR  Family Communication: None Disposition Plan: Plan to discharge home tomorrow   Status is: Inpatient Remains inpatient appropriate because: Hypoxia       Subjective:   Interval events noted.  She complains of cough.  Breathing is a little better.  Objective:    Vitals:   05/01/22 0845 05/01/22 0915 05/01/22 1030 05/01/22 1100  BP:  (!) 142/80 (!) 136/96   Pulse: 82 (!) 150 (!) 131   Resp: 20 20 (!) 22   Temp:      TempSrc:      SpO2: 94% 93% 94%   Weight:    60.9 kg  Height:    5' 0.98" (1.549 m)   No data found.   Intake/Output Summary (Last 24 hours) at 05/01/2022 1209 Last data filed at 05/01/2022 1829 Gross per 24 hour  Intake --  Output 1000 ml  Net -1000 ml   Filed Weights   05/01/22 1100  Weight: 60.9 kg    Exam:  GEN: NAD SKIN: Warm and dry EYES: EOMI ENT: MMM CV: RRR PULM: Decreased air entry bilaterally, occasional bilateral wheeze ABD: soft, ND, NT, +BS CNS: AAO x 3, non focal EXT: No edema or tenderness        Data Reviewed:   I have personally reviewed following labs and imaging studies:  Labs: Labs show the following:   Basic Metabolic Panel: Recent Labs  Lab 04/30/22 1047 05/01/22 0459  NA 134* 138  K 3.5 4.3  CL 94* 99  CO2 29 33*  GLUCOSE 113* 108*  BUN 25* 19  CREATININE 1.17* 0.94  CALCIUM 8.9 8.4*   GFR Estimated Creatinine Clearance: 30.6 mL/min (by C-G formula based on SCr of 0.94 mg/dL). Liver Function Tests: No results for input(s): "AST", "ALT", "ALKPHOS", "BILITOT", "PROT", "ALBUMIN" in the last 168 hours. No results for input(s): "LIPASE", "AMYLASE" in the last 168 hours. No results for input(s): "AMMONIA" in the last 168 hours. Coagulation profile Recent Labs  Lab  05/01/22 0459  INR 1.3*    CBC: Recent Labs  Lab 04/30/22 1047 05/01/22 0459  WBC 9.0 5.8  HGB 13.0 11.6*  HCT 38.9 35.8*  MCV 99.2 101.1*  PLT 160 144*   Cardiac Enzymes: No results for input(s): "CKTOTAL", "CKMB", "CKMBINDEX", "TROPONINI" in the last 168 hours. BNP (  last 3 results) No results for input(s): "PROBNP" in the last 8760 hours. CBG: No results for input(s): "GLUCAP" in the last 168 hours. D-Dimer: No results for input(s): "DDIMER" in the last 72 hours. Hgb A1c: No results for input(s): "HGBA1C" in the last 72 hours. Lipid Profile: No results for input(s): "CHOL", "HDL", "LDLCALC", "TRIG", "CHOLHDL", "LDLDIRECT" in the last 72 hours. Thyroid function studies: No results for input(s): "TSH", "T4TOTAL", "T3FREE", "THYROIDAB" in the last 72 hours.  Invalid input(s): "FREET3" Anemia work up: No results for input(s): "VITAMINB12", "FOLATE", "FERRITIN", "TIBC", "IRON", "RETICCTPCT" in the last 72 hours. Sepsis Labs: Recent Labs  Lab 04/30/22 1047 04/30/22 1403 04/30/22 1737 05/01/22 0459  PROCALCITON  --   --   --  <0.10  WBC 9.0  --   --  5.8  LATICACIDVEN  --  1.1 1.5  --     Microbiology Recent Results (from the past 240 hour(s))  Culture, blood (routine x 2)     Status: None (Preliminary result)   Collection Time: 04/30/22  2:03 PM   Specimen: BLOOD  Result Value Ref Range Status   Specimen Description BLOOD BLOOD LEFT ARM  Final   Special Requests   Final    BOTTLES DRAWN AEROBIC AND ANAEROBIC Blood Culture adequate volume   Culture   Final    NO GROWTH < 24 HOURS Performed at [facility], [address] [name], [place] [address]    Report Status PENDING  Incomplete  Culture, blood (routine x 2)     Status: None (Preliminary result)   Collection Time: 04/30/22  2:03 PM   Specimen: BLOOD  Result Value Ref Range Status   Specimen Description BLOOD BLOOD RIGHT HAND  Final   Special Requests   Final    BOTTLES DRAWN AEROBIC AND  ANAEROBIC Blood Culture results may not be optimal due to an inadequate volume of blood received in culture bottles   Culture   Final    NO GROWTH < 24 HOURS Performed at [facility], [address] [place], [place] [address]    Report Status PENDING  Incomplete    Procedures and diagnostic studies:  CT Angio Chest PE W/Cm &/Or Wo Cm  Result Date: 04/30/2022 CLINICAL DATA:  Cough and shortness of breath EXAM: CT ANGIOGRAPHY CHEST WITH CONTRAST TECHNIQUE: Multidetector CT imaging of the chest was performed using the standard protocol during bolus administration of intravenous contrast. Multiplanar CT image reconstructions and MIPs were obtained to evaluate the vascular anatomy. RADIATION DOSE REDUCTION: This exam was performed according to the departmental dose-optimization program which includes automated exposure control, adjustment of the mA and/or kV according to patient size and/or use of iterative reconstruction technique. CONTRAST:  1m OMNIPAQUE IOHEXOL 350 MG/ML SOLN COMPARISON:  Chest CT dated June 07, 2019; PET-CT dated August 13, 2021 FINDINGS: Cardiovascular: No evidence of pulmonary embolus. Cardiomegaly. Trace pericardial effusion. Normal caliber thoracic aorta with severe atherosclerotic disease. Severe left main and three-vessel coronary artery calcifications. Dilated main pulmonary artery, measuring up to 3.4 cm. Mediastinum/Nodes: Large hiatal hernia. Unchanged mildly enlarged mediastinal lymph nodes, reference precarinal lymph node measuring 10 mm in short axis on series 4, image 51, likely reactive. Lungs/Pleura: Bilateral bronchial wall thickening. Bilateral mosaic attenuation. Scattered endobronchial debris seen in the bilateral lower lobes and right middle lobe, which is most severe in the right middle lobe causing complete right middle lobe collapse. New scattered bilateral patchy consolidations and small nodular opacities. Reference solid nodule of the left lower  lobe measuring 5 mm on series 6, image 209 moderate right pleural effusion with associated atelectasis. Upper Abdomen: No acute abnormality. Musculoskeletal: No chest wall abnormality. No acute or significant osseous findings. Review of the MIP images confirms the above findings. IMPRESSION: 1. No evidence of pulmonary embolus. 2. Endobronchial debris seen in the bilateral lower lobes and right middle lobe, causing complete right middle lobe collapse, findings are likely due to aspiration. 3. Bronchial wall thickening, mosaic attenuation and new scattered bilateral patchy consolidations and small nodular opacities, likely due to infectious bronchitis. Recommend follow-up chest CT in 3 months to ensure resolution. 4. Moderate right pleural effusion with associated atelectasis. 5. Cardiomegaly with severe left main and three-vessel coronary artery calcifications. 6. Large hiatal hernia. 7. Aortic Atherosclerosis (ICD10-I70.0). Electronically Signed   By: Yetta Glassman M.D.   On: 04/30/2022 12:45   DG Chest 2 View  Result Date: 04/30/2022 CLINICAL DATA:  Chest pain EXAM: CHEST - 2 VIEW COMPARISON:  07/23/2021 FINDINGS: Lowi 14 hours. The cardio pericardial silhouette is enlarged. Interstitial markings are diffusely coarsened with chronic features. Right parahilar opacity may reflect pneumonia although central lung lesion not excluded. There is persistent right base collapse/consolidation with small right pleural effusion. Moderate hiatal hernia noted. Bones are diffusely demineralized. Telemetry leads overlie the chest. IMPRESSION: 1. Right parahilar opacity may reflect pneumonia although central lung lesion not excluded. 2. Right base collapse/consolidation again noted with small right pleural effusion. 3. Moderate hiatal hernia. Electronically Signed   By: Misty Stanley M.D.   On: 04/30/2022 11:29               LOS: 1 day   Mende Biswell  [facility]   Pager on [email or web address]  7PM-7AM, please contact night-coverage at [email or web address]     05/01/2022, 12:09 PM

## 2022-05-01 NOTE — Evaluation (Signed)
Clinical/Bedside Swallow Evaluation Patient Details  Name: Allison Novak MRN: [id number] Date of Birth: 1925/12/19  Today's Date: 05/01/2022 Time: SLP Start Time (ACUTE ONLY): 0800 SLP Stop Time (ACUTE ONLY): 0820 SLP Time Calculation (min) (ACUTE ONLY): 20 min  Past Medical History:  Past Medical History:  Diagnosis Date   A-fib ([place])    Anemia    Carotid artery stenosis    CHF (congestive heart failure) (HCC)    Chronic kidney disease    Coronary artery disease    Diverticulosis    GERD (gastroesophageal reflux disease)    Gout    Hiatal hernia    Hyperlipidemia    Hypertension    Osteoporosis    Pulmonary embolus ([place])    Past Surgical History:  Past Surgical History:  Procedure Laterality Date   ABDOMINAL HYSTERECTOMY     ANKLE SURGERY Left 2011   KNEE ARTHROSCOPY Right    HPI:  Per H&P "This is a pleasant 88yo F with hx of sub-acute cough x 2 weeks.  She has a history of A-fib, chronic diastolic dysfunction CHF last known LVEF of 55 to 60%, history of coronary artery disease, stage III chronic kidney disease, hypertension who presents to the ER from home [facility] [place] for evaluation of worsening shortness of breath and a refractory cough which is wet sounding and raspy.Patient tested positive for RSV on 04/27/22 and has been on a Z-Pak for presumed pneumonia as well as a steroid taper.  Her symptoms have not improved and her cough is worse as well as shortness of breath and so EMS was called and she was found to have room air pulse oximetry of 87%.  She was placed on 2 L of oxygen with improvement in her pulse oximetry to 96%.  She denies having any chest pain, no nausea, no vomiting, no dizziness, no lightheadedness, no fever, no chills, no leg swelling, no palpitations, no blurred vision no focal deficit.  Per ER physician she had a Tmax of 101 in the field and received Tylenol, she is tachycardic and tachypneic  Chest x-ray reviewed by me shows right parahilar  opacity may reflect pneumonia although central lung lesion not excluded. Right base collapse/consolidation again noted with small right pleural effusion.  Moderate hiatal hernia.  CT imaging  CTA showed no evidence of pulmonary embolus. Endobronchial debris seen in the bilateral lower lobes and right middle lobe, causing complete right middle lobe collapse, findingsare likely due to aspiration. Bronchial wall thickening, mosaic attenuation and new scattered  bilateral patchy consolidations and small nodular opacities, likely due to infectious bronchitis. Recommend follow-up chest CT in 3 months to ensure resolution. Moderate right pleural effusion with associated atelectasis. Cardiomegaly with severe left main and three-vessel coronary artery calcifications. Large hiatal hernia. Aortic Atherosclerosis. PCCM consultation for further management. She does not use oxygen at home."    Assessment / Plan / Recommendation  Clinical Impression  Pt alert, pleasant, and cooperative. On 2L/min O2 via [place]. Denies dysphagia.  Baseline non-productive cough appreciated.   Noted pt with hx of large hiatal hernia. Pt seen by SLP in March 2022 with recommendation for a regular diet with chopped meats and thin liquids.   Pt given trials of solid, pureed, and thin liquids. Pt demonstrated a functional oral swallow. Pharyngeal swallow appeared [facility] per clinicals assessment. No overt s/sx pharyngeal dysphagia. Seemingly timely swallow initiation and seemingly adequate laryngeal elevation to palpation. No change to vocal quality across POs. Pt able to feed self during course  of evaluation.  Recommend initiation of a regular diet with thin liquids and safe swallowing strategies/aspiration precautions/reflux precautions as outlined below. Pt is at mildly increased risk for aspiration given GI hx.   Pt and RN made aware of results, recommendations, and SLP POC.   SLP to sign off as pt has no acute SLP needs at this time.   SLP  Visit Diagnosis: Dysphagia, unspecified (R13.10)    Aspiration Risk  Mild aspiration risk    Diet Recommendation Regular;Thin liquid   Medication Administration:  (as tolerated) Supervision: Patient able to self feed Compensations: Slow rate;Small sips/bites Postural Changes: Seated upright at 90 degrees;Remain upright for at least 30 minutes after po intake    Other  Recommendations Oral Care Recommendations: Oral care QID    Recommendations for follow up therapy are one component of a multi-disciplinary discharge planning process, led by the attending physician.  Recommendations may be updated based on patient status, additional functional criteria and insurance authorization.  Follow up Recommendations No SLP follow up         Functional Status Assessment Patient has not had a recent decline in their functional status   Prognosis Prognosis for Safe Diet Advancement: Good      Swallow Study   General Date of Onset: 05/01/22 HPI: Per H&P "This is a pleasant 88yo F with hx of sub-acute cough x 2 weeks.  She has a history of A-fib, chronic diastolic dysfunction CHF last known LVEF of 55 to 60%, history of coronary artery disease, stage III chronic kidney disease, hypertension who presents to the ER from home [facility] [place] for evaluation of worsening shortness of breath and a refractory cough which is wet sounding and raspy.Patient tested positive for RSV on 04/27/22 and has been on a Z-Pak for presumed pneumonia as well as a steroid taper.  Her symptoms have not improved and her cough is worse as well as shortness of breath and so EMS was called and she was found to have room air pulse oximetry of 87%.  She was placed on 2 L of oxygen with improvement in her pulse oximetry to 96%.  She denies having any chest pain, no nausea, no vomiting, no dizziness, no lightheadedness, no fever, no chills, no leg swelling, no palpitations, no blurred vision no focal deficit.  Per ER physician she  had a Tmax of 101 in the field and received Tylenol, she is tachycardic and tachypneic  Chest x-ray reviewed by me shows right parahilar opacity may reflect pneumonia although central lung lesion not excluded. Right base collapse/consolidation again noted with small right pleural effusion.  Moderate hiatal hernia.  CT imaging  CTA showed no evidence of pulmonary embolus. Endobronchial debris seen in the bilateral lower lobes and right middle lobe, causing complete right middle lobe collapse, findingsare likely due to aspiration. Bronchial wall thickening, mosaic attenuation and new scattered  bilateral patchy consolidations and small nodular opacities, likely due to infectious bronchitis. Recommend follow-up chest CT in 3 months to ensure resolution. Moderate right pleural effusion with associated atelectasis. Cardiomegaly with severe left main and three-vessel coronary artery calcifications. Large hiatal hernia. Aortic Atherosclerosis. PCCM consultation for further management. She does not use oxygen at home." Type of Study: Bedside Swallow Evaluation Previous Swallow Assessment: clinical swallowing evaluation in March 2022 - regular diet with chopped meats, thin liquids Diet Prior to this Study: Regular;Thin liquids Temperature Spikes Noted: No Respiratory Status: Nasal cannula History of Recent Intubation: No Behavior/Cognition: Alert;Cooperative;Pleasant mood Oral Cavity - Dentition:  Missing dentition (lower partial; broken and missing teeth) Self-Feeding Abilities: Able to feed self Patient Positioning: Upright in bed Baseline Vocal Quality: Normal Volitional Cough: Strong Volitional Swallow: Able to elicit    Oral/Motor/Sensory Function Overall Oral Motor/Sensory Function: Within functional limits   Ice Chips Ice chips: Not tested   Thin Liquid Thin Liquid: Within functional limits Presentation: Straw Other Comments: 3 oz water challenge    Nectar Thick Nectar Thick Liquid: Not tested    Honey Thick Honey Thick Liquid: Not tested   Puree Puree: Within functional limits Presentation: Self Fed Other Comments: ~3 oz   Solid     Solid: Within functional limits Presentation: Self Fed Other Comments: x2 graham crackers presented in halves     Cherrie Gauze, M.S., [facility] [phone] ([place])  Clearnce Sorrel Tycen Dockter 05/01/2022,9:15 AM

## 2022-05-01 NOTE — Consult Note (Signed)
PULMONOLOGY         Date: 05/01/2022,   MRN# [id number] Allison Novak 04/13/1926     AdmissionWeight: 60.9 kg                 CurrentWeight: 60.9 kg  Referring provider: Dr Francine Graven   CHIEF COMPLAINT:   Sub-Acute Cough  HISTORY OF PRESENT ILLNESS  This is a pleasant 88yo F with hx of sub-acute cough x 2 weeks.  She has a history of A-fib, chronic diastolic dysfunction CHF last known LVEF of 55 to 60%, history of coronary artery disease, stage III chronic kidney disease, hypertension who presents to the ER from home [facility] for evaluation of worsening shortness of breath and a refractory cough which is wet sounding and raspy.Patient tested positive for RSV on 04/27/22 and has been on a Z-Pak for presumed pneumonia as well as a steroid taper.  Her symptoms have not improved and her cough is worse as well as shortness of breath and so EMS was called and she was found to have room air pulse oximetry of 87%.  She was placed on 2 L of oxygen with improvement in her pulse oximetry to 96%. She denies having any chest pain, no nausea, no vomiting, no dizziness, no lightheadedness, no fever, no chills, no leg swelling, no palpitations, no blurred vision no focal deficit.  Per ER physician she had a Tmax of 101 in the field and received Tylenol, she is tachycardic and tachypneic Chest x-ray reviewed by me shows right parahilar opacity may reflect pneumonia although central lung lesion not excluded. Right base collapse/consolidation again noted with small right pleural effusion.  Moderate hiatal hernia.  CT imaging CTA showed no evidence of pulmonary embolus. Endobronchial debris seen in the bilateral lower lobes and right middle lobe, causing complete right middle lobe collapse, findingsare likely due to aspiration. Bronchial wall thickening, mosaic attenuation and new scattered bilateral patchy consolidations and small nodular opacities, likely due to infectious bronchitis.  Recommend follow-up chest CT in 3 months to ensure resolution. Moderate right pleural effusion with associated atelectasis. Cardiomegaly with severe left main and three-vessel coronary artery calcifications. Large hiatal hernia. Aortic Atherosclerosis. PCCM consultation for further management. She does not use oxygen at home.   05/01/22- patient is improved, her renal function has normalized. She is requiring less medication and requiring less oxygen.  [name] granddaughter of patient came in to visit during my examination.    PAST MEDICAL HISTORY   Past Medical History:  Diagnosis Date   A-fib ([place])    Anemia    Carotid artery stenosis    CHF (congestive heart failure) (HCC)    Chronic kidney disease    Coronary artery disease    Diverticulosis    GERD (gastroesophageal reflux disease)    Gout    Hiatal hernia    Hyperlipidemia    Hypertension    Osteoporosis    Pulmonary embolus (HCC)      SURGICAL HISTORY   Past Surgical History:  Procedure Laterality Date   ABDOMINAL HYSTERECTOMY     ANKLE SURGERY Left 2011   KNEE ARTHROSCOPY Right      FAMILY HISTORY   Family History  Problem Relation Age of Onset   CAD Mother    Hypertension Sister    COPD Brother      SOCIAL HISTORY   Social History   Tobacco Use   Smoking status: Never   Smokeless tobacco: Never  Substance Use  Topics   Alcohol use: No    Alcohol/week: 0.0 standard drinks of alcohol   Drug use: No     MEDICATIONS    Home Medication:  Current Outpatient Rx   Order #: [id number] Class: Historical Med   Order #: [id number] Class: Historical Med   Order #: [id number] Class: Historical Med   Order #: [id number] Class: Historical Med   Order #: [id number] Class: Historical Med   Order #: [id number] Class: Historical Med   Order #: [id number] Class: Historical Med   Order #: [id number] Class: Historical Med   Order #: [id number] Class: Historical Med   Order #: [id number] Class: Historical Med   Order #:  [id number] Class: Historical Med   Order #: [id number] Class: Normal   Order #: [id number] Class: Historical Med   Order #: [id number] Class: Historical Med   Order #: [id number] Class: Historical Med   Order #: [id number] Class: Print   Order #: [id number] Class: Historical Med   Order #: [id number] Class: Historical Med   Order #: [id number] Class: Historical Med    Current Medication:  Current Facility-Administered Medications:    acetaminophen (TYLENOL) tablet 650 mg, 650 mg, Oral, Q6H PRN **OR** acetaminophen (TYLENOL) suppository 650 mg, 650 mg, Rectal, Q6H PRN, Agbata, Tochukwu, MD   allopurinol (ZYLOPRIM) tablet 100 mg, 100 mg, Oral, Daily, Agbata, Tochukwu, MD, 100 mg at 05/01/22 1053   amoxicillin-clavulanate (AUGMENTIN) 875-125 MG per tablet 1 tablet, 1 tablet, Oral, Q12H, Ottie Glazier, MD, 1 tablet at 05/01/22 0913   atorvastatin (LIPITOR) tablet 40 mg, 40 mg, Oral, Daily, Agbata, Tochukwu, MD, 40 mg at 05/01/22 0913   digoxin (LANOXIN) tablet 0.125 mg, 0.125 mg, Oral, Daily, Agbata, Tochukwu, MD, 0.125 mg at 05/01/22 1052   guaiFENesin-dextromethorphan (ROBITUSSIN DM) 100-10 MG/5ML syrup 5 mL, 5 mL, Oral, Q4H PRN, Jennye Boroughs, MD, 5 mL at 05/01/22 1219   ipratropium-albuterol (DUONEB) 0.5-2.5 (3) MG/3ML nebulizer solution 3 mL, 3 mL, Nebulization, Q6H PRN, Agbata, Tochukwu, MD   levothyroxine (SYNTHROID) tablet 25 mcg, 25 mcg, Oral, Q0600, Agbata, Tochukwu, MD, 25 mcg at 05/01/22 0913   methylPREDNISolone (MEDROL DOSEPAK) tablet 4 mg, 4 mg, Oral, 3 x daily with food, Ottie Glazier, MD, 4 mg at 05/01/22 1219   [START ON 05/02/2022] methylPREDNISolone (MEDROL DOSEPAK) tablet 4 mg, 4 mg, Oral, 4X daily taper, Lanney Gins, Dorma Altman, MD   methylPREDNISolone (MEDROL DOSEPAK) tablet 8 mg, 8 mg, Oral, Nightly, Carmin Dibartolo, MD   metoprolol succinate (TOPROL-XL) 24 hr tablet 50 mg, 50 mg, Oral, Daily, Agbata, Tochukwu, MD, 50 mg at 05/01/22 0913   multivitamin with minerals tablet 1 tablet, 1 tablet,  Oral, Daily, Agbata, Tochukwu, MD, 1 tablet at 05/01/22 0916   ondansetron (ZOFRAN) tablet 4 mg, 4 mg, Oral, Q6H PRN **OR** ondansetron (ZOFRAN) injection 4 mg, 4 mg, Intravenous, Q6H PRN, Agbata, Tochukwu, MD   pantoprazole (PROTONIX) EC tablet 40 mg, 40 mg, Oral, Daily, Agbata, Tochukwu, MD, 40 mg at 05/01/22 0913   potassium chloride SA (KLOR-CON M) CR tablet 10 mEq, 10 mEq, Oral, Daily, Agbata, Tochukwu, MD, 10 mEq at 05/01/22 0913   sodium chloride HYPERTONIC 3 % nebulizer solution 4 mL, 4 mL, Nebulization, BID, Agbata, Tochukwu, MD, 4 mL at 05/01/22 2482  Current Outpatient Medications:    albuterol (VENTOLIN HFA) 108 (90 Base) MCG/ACT inhaler, Inhale 2 puffs into the lungs every 6 (six) hours as needed for wheezing or shortness of breath., Disp: , Rfl:    allopurinol (ZYLOPRIM) 100 MG tablet, Take 100 mg by mouth daily., Disp: , Rfl:    atorvastatin (LIPITOR) 40 MG tablet, Take 40 mg  by mouth daily., Disp: , Rfl: 2   Azelastine-Fluticasone 137-50 MCG/ACT SUSP, Place 1 puff into the nose daily., Disp: , Rfl:    azithromycin (ZITHROMAX) 250 MG tablet, Take 250-500 mg by mouth daily., Disp: , Rfl:    calcium carbonate (OSCAL) 1500 (600 Ca) MG TABS tablet, Take 1 tablet by mouth in the morning and at bedtime., Disp: , Rfl:    Cholecalciferol 50 MCG (2000 UT) CAPS, Take 2,000 Units by mouth daily., Disp: , Rfl:    digoxin (LANOXIN) 0.125 MG tablet, Take 0.0625 mg by mouth daily., Disp: , Rfl:    ELIQUIS 2.5 MG TABS tablet, Take 2.5 mg by mouth 2 (two) times daily., Disp: , Rfl:    esomeprazole (NEXIUM) 40 MG capsule, Take 40 mg by mouth daily., Disp: , Rfl:    levothyroxine (SYNTHROID) 25 MCG tablet, Take 25 mcg by mouth daily., Disp: , Rfl:    metoprolol succinate (TOPROL-XL) 50 MG 24 hr tablet, Take 1 tablet (50 mg total) by mouth daily. Take with or immediately following a meal., Disp: 30 tablet, Rfl: 1   Multiple Vitamin (MULTI-VITAMIN) tablet, Take 1 tablet by mouth daily., Disp: , Rfl:     Potassium Chloride ER 20 MEQ TBCR, Take 20 mEq by mouth daily., Disp: , Rfl:    predniSONE (DELTASONE) 10 MG tablet, Take 10 mg by mouth 2 (two) times daily with a meal., Disp: , Rfl:    torsemide (DEMADEX) 20 MG tablet, Take 1 tablet (20 mg total) by mouth 2 (two) times daily. (Patient taking differently: Take 40 mg by mouth daily.), Disp: 60 tablet, Rfl: 0   albuterol (ACCUNEB) 1.25 MG/3ML nebulizer solution, Take 1 ampule by nebulization every 6 (six) hours as needed for wheezing., Disp: , Rfl:    polyethylene glycol powder (GLYCOLAX/MIRALAX) 17 GM/SCOOP powder, Take by mouth. (Patient not taking: Reported on 09/06/2021), Disp: , Rfl:    polyvinyl alcohol (LIQUIFILM TEARS) 1.4 % ophthalmic solution, Place 2 drops into the left eye every 8 (eight) hours as needed for dry eyes., Disp: , Rfl:   Facility-Administered Medications Ordered in Other Encounters:    epoetin alfa-epbx (RETACRIT) injection 40,000 Units, 40,000 Units, Subcutaneous, Q21 days, Sindy Guadeloupe, MD, 40,000 Units at 09/06/21 1425    ALLERGIES   Keflex [cephalexin] and Macrodantin [nitrofurantoin macrocrystal]     REVIEW OF SYSTEMS    Review of Systems:  Gen:  Denies  fever, sweats, chills weigh loss  HEENT: Denies blurred vision, double vision, ear pain, eye pain, hearing loss, nose bleeds, sore throat Cardiac:  No dizziness, chest pain or heaviness, chest tightness,edema Resp:   reports dyspnea chronically  Gi: Denies swallowing difficulty, stomach pain, nausea or vomiting, diarrhea, constipation, bowel incontinence Gu:  Denies bladder incontinence, burning urine Ext:   Denies Joint pain, stiffness or swelling Skin: Denies  skin rash, easy bruising or bleeding or hives Endoc:  Denies polyuria, polydipsia , polyphagia or weight change Psych:   Denies depression, insomnia or hallucinations   Other:  All other systems negative   VS: BP (!) 140/101   Pulse (!) 109   Temp 98.3 F (36.8 C) (Oral)   Resp (!) 22    Ht 5' 0.98" (1.549 m)   Wt 60.9 kg   SpO2 93%   BMI 25.38 kg/m      PHYSICAL EXAM    GENERAL:NAD, no fevers, chills, no weakness no fatigue HEAD: Normocephalic, atraumatic.  EYES: Pupils equal, round, reactive to light. Extraocular muscles intact. No scleral  icterus.  MOUTH: Moist mucosal membrane. Dentition intact. No abscess noted.  EAR, NOSE, THROAT: Clear without exudates. No external lesions.  NECK: Supple. No thyromegaly. No nodules. No JVD.  PULMONARY: decreased breath sounds with mild rhonchi worse at bases bilaterally.  CARDIOVASCULAR: S1 and S2. Regular rate and rhythm. No murmurs, rubs, or gallops. No edema. Pedal pulses 2+ bilaterally.  GASTROINTESTINAL: Soft, nontender, nondistended. No masses. Positive bowel sounds. No hepatosplenomegaly.  MUSCULOSKELETAL: No swelling, clubbing, or edema. Range of motion full in all extremities.  NEUROLOGIC: Cranial nerves II through XII are intact. No gross focal neurological deficits. Sensation intact. Reflexes intact.  SKIN: No ulceration, lesions, rashes, or cyanosis. Skin warm and dry. Turgor intact.  PSYCHIATRIC: Mood, affect within normal limits. The patient is awake, alert and oriented x 3. Insight, judgment intact.       IMAGING     ASSESSMENT/PLAN   Acute hypoxemic respiratory failure - present on admission  - COVID19 negative -RSV +  - supplemental O2 during my evaluation 2L/[place] - patient denies chest pain d/c planning  -please encourage patient to use incentive spirometer few times each hour while hospitalized.   -can dc IV Zithromax, and IV Rocephin and Flagyl, and can do PO augmentin. Can DC soumedrol and continue prednisone with medrol dose pack -patient tolerating hypertonic saline   Right pleural effusion  - chronic effusion associated with rounded atelectasis    Thank you for allowing me to participate in the care of this patient.   Patient/Family are satisfied with care plan and all questions have  been answered.    Provider disclosure: Patient with at least one acute or chronic illness or injury that poses a threat to life or bodily function and is being managed actively during this encounter.  All of the below services have been performed independently by signing provider:  review of prior documentation from internal and or external health records.  Review of previous and current lab results.  Interview and comprehensive assessment during patient visit today. Review of current and previous chest radiographs/CT scans. Discussion of management and test interpretation with health care team and patient/family.   This document was prepared using Dragon voice recognition software and may include unintentional dictation errors.     Ottie Glazier, M.D.  Division of Pulmonary & Critical Care Medicine

## 2022-05-02 DIAGNOSIS — J205 Acute bronchitis due to respiratory syncytial virus: Secondary | ICD-10-CM | POA: Diagnosis not present

## 2022-05-02 DIAGNOSIS — A419 Sepsis, unspecified organism: Secondary | ICD-10-CM | POA: Diagnosis not present

## 2022-05-02 DIAGNOSIS — J9601 Acute respiratory failure with hypoxia: Secondary | ICD-10-CM | POA: Diagnosis not present

## 2022-05-02 DIAGNOSIS — J189 Pneumonia, unspecified organism: Secondary | ICD-10-CM | POA: Diagnosis not present

## 2022-05-02 MED ORDER — APIXABAN 2.5 MG PO TABS
2.5000 mg | ORAL_TABLET | Freq: Two times a day (BID) | ORAL | Status: DC
  Administered 2022-05-02 – 2022-05-04 (×4): 2.5 mg via ORAL
  Filled 2022-05-02 (×4): qty 1

## 2022-05-02 NOTE — Progress Notes (Addendum)
Progress Note    Allison Novak  FOY:[id number] DOB: [birth year]  DOA: 04/30/2022 PCP: Adin Hector, MD      Brief Narrative:    Medical records reviewed and are as summarized below:  Allison Novak is a 88 y.o. female  with medical history significant for permanent A-fib, chronic diastolic dysfunction CHF last known LVEF of 55 to 60%, history of coronary artery disease, stage III chronic kidney disease, hypertension who presents to the ER from home [facility] [place] for evaluation of worsening shortness of breath and a refractory cough which is wet sounding and raspy. Patient tested positive for RSV on 04/27/22 and has been on a Z-Pak for presumed pneumonia as well as a steroid taper.  Her symptoms have not improved and her cough is worse as well as shortness of breath and so EMS was called and she was found to have room air pulse oximetry of 87%.  She was placed on 2 L of oxygen with improvement in her pulse oximetry to 96%.   She was admitted to the hospital for sepsis secondary to RSV pneumonia complicated by acute hypoxic respiratory failure.    Assessment/Plan:   Principal Problem:   Sepsis due to pneumonia [facility]) Active Problems:   Chronic diastolic CHF (congestive heart failure) (HCC)   Acute respiratory failure with hypoxia (HCC)   Acute bronchitis due to respiratory syncytial virus (RSV)   Aspiration pneumonia (HCC)   Stage 3a chronic kidney disease (CKD) ([place])   Hiatal hernia with GERD without esophagitis   Essential hypertension   Atrial fibrillation with RVR (HCC)   Pulmonary embolism (HCC)   Hypothyroidism    Body mass index is 24.97 kg/m.   Sepsis secondary to RSV bronchitis, aspiration pneumonia: Continue prednisone and Augmentin.  Continue Robitussin as needed for cough.  Continue to use incentive spirometer regularly.  Acute hypoxic respiratory failure: Improving on 2 L/min oxygen via .  Taper off oxygen.  Chronic diastolic CHF:  Compensated.  Atrial fibrillation with RVR: Continue metoprolol, digoxin and Eliquis.   History of pulmonary embolism: Continue Eliquis  Other comorbidities include advanced age, CKD stage IIIa, hypothyroidism, hypertension, hiatal hernia with GERD  Diet Order             Diet regular Room service appropriate? Yes; Fluid consistency: Thin  Diet effective now                            Consultants: Pulmonologist  Procedures: None    Medications:    allopurinol  100 mg Oral Daily   amoxicillin-clavulanate  1 tablet Oral Q12H   apixaban  2.5 mg Oral BID   atorvastatin  40 mg Oral Daily   digoxin  0.125 mg Oral Daily   levothyroxine  25 mcg Oral Q0600   methylPREDNISolone  4 mg Oral 4X daily taper   metoprolol succinate  50 mg Oral Daily   multivitamin with minerals  1 tablet Oral Daily   pantoprazole  40 mg Oral Daily   potassium chloride  10 mEq Oral Daily   sodium chloride HYPERTONIC  4 mL Nebulization BID   Continuous Infusions:   Anti-infectives (From admission, onward)    Start     Dose/Rate Route Frequency Ordered Stop   04/30/22 2200  Ampicillin-Sulbactam (UNASYN) 3 g in sodium chloride 0.9 % 100 mL IVPB  Status:  Discontinued        3 g 200  mL/hr over 30 Minutes Intravenous Every 12 hours 04/30/22 1452 04/30/22 1502   04/30/22 1515  amoxicillin-clavulanate (AUGMENTIN) 875-125 MG per tablet 1 tablet        1 tablet Oral Every 12 hours 04/30/22 1503     04/30/22 1300  cefTRIAXone (ROCEPHIN) 1 g in sodium chloride 0.9 % 100 mL IVPB        1 g 200 mL/hr over 30 Minutes Intravenous  Once 04/30/22 1254 04/30/22 1425   04/30/22 1300  metroNIDAZOLE (FLAGYL) IVPB 500 mg  Status:  Discontinued        500 mg 100 mL/hr over 60 Minutes Intravenous  Once 04/30/22 1254 04/30/22 1502   04/30/22 1300  azithromycin (ZITHROMAX) 500 mg in sodium chloride 0.9 % 250 mL IVPB  Status:  Discontinued        500 mg 250 mL/hr over 60 Minutes Intravenous  Once 04/30/22  1254 04/30/22 1502              Family Communication/Anticipated D/C date and plan/Code Status   DVT prophylaxis: apixaban (ELIQUIS) tablet 2.5 mg Start: 05/02/22 2200 apixaban (ELIQUIS) tablet 2.5 mg     Code Status: DNR  Family Communication: None Disposition Plan: Plan to discharge home tomorrow   Status is: Inpatient Remains inpatient appropriate because: Hypoxia       Subjective:   Interval events noted.  She still complains of cough with congestion.  Breathing is a little better.  She does not feel back to baseline. Caralyn, RN, was at the bedside  Objective:    Vitals:   05/02/22 0821 05/02/22 0900 05/02/22 0949 05/02/22 1248  BP:  126/80  (!) 140/85  Pulse:  90  84  Resp:  18  17  Temp:  97.8 F (36.6 C)  97.6 F (36.4 C)  TempSrc:  Oral  Oral  SpO2: 95% 95% 91% 90%  Weight:      Height:       No data found.   Intake/Output Summary (Last 24 hours) at 05/02/2022 1503 Last data filed at 05/02/2022 0655 Gross per 24 hour  Intake 40 ml  Output 476 ml  Net -436 ml   Filed Weights   05/01/22 1100 05/01/22 2247  Weight: 60.9 kg 59.9 kg    Exam:   GEN: NAD SKIN: Warm and dry EYES: Anicteric ENT: MMM CV: RRR PULM: Improving air entry bilaterally, occasional wheezing ABD: soft, ND, NT, +BS CNS: AAO x 3, non focal EXT: No edema or tenderness      Data Reviewed:   I have personally reviewed following labs and imaging studies:  Labs: Labs show the following:   Basic Metabolic Panel: Recent Labs  Lab 04/30/22 1047 05/01/22 0459  NA 134* 138  K 3.5 4.3  CL 94* 99  CO2 29 33*  GLUCOSE 113* 108*  BUN 25* 19  CREATININE 1.17* 0.94  CALCIUM 8.9 8.4*   GFR Estimated Creatinine Clearance: 30.4 mL/min (by C-G formula based on SCr of 0.94 mg/dL). Liver Function Tests: No results for input(s): "AST", "ALT", "ALKPHOS", "BILITOT", "PROT", "ALBUMIN" in the last 168 hours. No results for input(s): "LIPASE", "AMYLASE" in the last  168 hours. No results for input(s): "AMMONIA" in the last 168 hours. Coagulation profile Recent Labs  Lab 05/01/22 0459  INR 1.3*    CBC: Recent Labs  Lab 04/30/22 1047 05/01/22 0459  WBC 9.0 5.8  HGB 13.0 11.6*  HCT 38.9 35.8*  MCV 99.2 101.1*  PLT 160 144*   Cardiac  Enzymes: No results for input(s): "CKTOTAL", "CKMB", "CKMBINDEX", "TROPONINI" in the last 168 hours. BNP (last 3 results) No results for input(s): "PROBNP" in the last 8760 hours. CBG: No results for input(s): "GLUCAP" in the last 168 hours. D-Dimer: No results for input(s): "DDIMER" in the last 72 hours. Hgb A1c: No results for input(s): "HGBA1C" in the last 72 hours. Lipid Profile: No results for input(s): "CHOL", "HDL", "LDLCALC", "TRIG", "CHOLHDL", "LDLDIRECT" in the last 72 hours. Thyroid function studies: No results for input(s): "TSH", "T4TOTAL", "T3FREE", "THYROIDAB" in the last 72 hours.  Invalid input(s): "FREET3" Anemia work up: No results for input(s): "VITAMINB12", "FOLATE", "FERRITIN", "TIBC", "IRON", "RETICCTPCT" in the last 72 hours. Sepsis Labs: Recent Labs  Lab 04/30/22 1047 04/30/22 1403 04/30/22 1737 05/01/22 0459  PROCALCITON  --   --   --  <0.10  WBC 9.0  --   --  5.8  LATICACIDVEN  --  1.1 1.5  --     Microbiology Recent Results (from the past 240 hour(s))  Culture, blood (routine x 2)     Status: None (Preliminary result)   Collection Time: 04/30/22  2:03 PM   Specimen: BLOOD  Result Value Ref Range Status   Specimen Description BLOOD BLOOD LEFT ARM  Final   Special Requests   Final    BOTTLES DRAWN AEROBIC AND ANAEROBIC Blood Culture adequate volume   Culture   Final    NO GROWTH 2 DAYS Performed at [facility], [address], [place], [place] [address]    Report Status PENDING  Incomplete  Culture, blood (routine x 2)     Status: None (Preliminary result)   Collection Time: 04/30/22  2:03 PM   Specimen: BLOOD  Result Value Ref Range Status    Specimen Description BLOOD BLOOD RIGHT HAND  Final   Special Requests   Final    BOTTLES DRAWN AEROBIC AND ANAEROBIC Blood Culture results may not be optimal due to an inadequate volume of blood received in culture bottles   Culture   Final    NO GROWTH 2 DAYS Performed at [facility], [address] [place], [place] [address]    Report Status PENDING  Incomplete    Procedures and diagnostic studies:  No results found.             LOS: 2 days   Lonny Eisen  [facility]   Pager on [email or web address]. If 7PM-7AM, please contact night-coverage at [email or web address]     05/02/2022, 3:03 PM

## 2022-05-03 DIAGNOSIS — J205 Acute bronchitis due to respiratory syncytial virus: Secondary | ICD-10-CM | POA: Diagnosis not present

## 2022-05-03 DIAGNOSIS — J189 Pneumonia, unspecified organism: Secondary | ICD-10-CM | POA: Diagnosis not present

## 2022-05-03 DIAGNOSIS — A419 Sepsis, unspecified organism: Secondary | ICD-10-CM | POA: Diagnosis not present

## 2022-05-03 DIAGNOSIS — J9601 Acute respiratory failure with hypoxia: Secondary | ICD-10-CM | POA: Diagnosis not present

## 2022-05-03 MED ORDER — ALBUTEROL SULFATE (2.5 MG/3ML) 0.083% IN NEBU: 2.5000 mg | INHALATION_SOLUTION | RESPIRATORY_TRACT | Status: DC | PRN

## 2022-05-03 MED ORDER — ACETYLCYSTEINE 20 % IN SOLN
3.0000 mL | Freq: Three times a day (TID) | RESPIRATORY_TRACT | Status: DC
  Administered 2022-05-03 – 2022-05-04 (×3): 3 mL via ORAL
  Filled 2022-05-03 (×5): qty 4

## 2022-05-03 MED ORDER — IPRATROPIUM-ALBUTEROL 0.5-2.5 (3) MG/3ML IN SOLN
3.0000 mL | Freq: Three times a day (TID) | RESPIRATORY_TRACT | Status: DC
  Administered 2022-05-03 – 2022-05-04 (×3): 3 mL via RESPIRATORY_TRACT
  Filled 2022-05-03 (×3): qty 3

## 2022-05-03 NOTE — Progress Notes (Addendum)
Progress Note    Allison Novak  KXF:[id number] DOB: 09-15-1925  DOA: 04/30/2022 PCP: Adin Hector, MD      Brief Narrative:    Medical records reviewed and are as summarized below:  Allison Novak is a 88 y.o. female  with medical history significant for permanent A-fib, chronic diastolic dysfunction CHF last known LVEF of 55 to 60%, history of coronary artery disease, stage III chronic kidney disease, hypertension who presents to the ER from home [facility] for evaluation of worsening shortness of breath and a refractory cough which is wet sounding and raspy. Patient tested positive for RSV on 04/27/22 and has been on a Z-Pak for presumed pneumonia as well as a steroid taper.  Her symptoms have not improved and her cough is worse as well as shortness of breath and so EMS was called and she was found to have room air pulse oximetry of 87%.  She was placed on 2 L of oxygen with improvement in her pulse oximetry to 96%.   She was admitted to the hospital for sepsis secondary to RSV pneumonia complicated by acute hypoxic respiratory failure.    Assessment/Plan:   Principal Problem:   Sepsis due to pneumonia [facility]) Active Problems:   Chronic diastolic CHF (congestive heart failure) (HCC)   Acute respiratory failure with hypoxia (HCC)   Acute bronchitis due to respiratory syncytial virus (RSV)   Aspiration pneumonia (HCC)   Stage 3a chronic kidney disease (CKD) ([place])   Hiatal hernia with GERD without esophagitis   Essential hypertension   Atrial fibrillation with RVR (HCC)   Pulmonary embolism (HCC)   Hypothyroidism    Body mass index is 24.97 kg/m.   Sepsis secondary to RSV bronchitis, aspiration pneumonia: Continue Augmentin and methylprednisone.  Robitussin as needed for cough. Encourage ambulation in the hallway with assistance  Acute hypoxic respiratory failure: Unable to wean off oxygen at this time.  Continue 2 L/min oxygen via [place].  Chronic  diastolic CHF: Compensated.  Atrial fibrillation with RVR: Heart rate is elevated between 100 and 120.  Continue digoxin, metoprolol and Eliquis  History of pulmonary embolism: Continue Eliquis  Other comorbidities include advanced age, CKD stage IIIa, hypothyroidism, hypertension, hiatal hernia with GERD    Diet Order             Diet regular Room service appropriate? Yes; Fluid consistency: Thin  Diet effective now                            Consultants: Pulmonologist  Procedures: None    Medications:    acetylcysteine  3 mL Oral TID   allopurinol  100 mg Oral Daily   amoxicillin-clavulanate  1 tablet Oral Q12H   apixaban  2.5 mg Oral BID   atorvastatin  40 mg Oral Daily   digoxin  0.125 mg Oral Daily   ipratropium-albuterol  3 mL Nebulization TID   levothyroxine  25 mcg Oral Q0600   methylPREDNISolone  4 mg Oral 4X daily taper   metoprolol succinate  50 mg Oral Daily   multivitamin with minerals  1 tablet Oral Daily   pantoprazole  40 mg Oral Daily   potassium chloride  10 mEq Oral Daily   sodium chloride HYPERTONIC  4 mL Nebulization BID   Continuous Infusions:   Anti-infectives (From admission, onward)    Start     Dose/Rate Route Frequency Ordered Stop  04/30/22 2200  Ampicillin-Sulbactam (UNASYN) 3 g in sodium chloride 0.9 % 100 mL IVPB  Status:  Discontinued        3 g 200 mL/hr over 30 Minutes Intravenous Every 12 hours 04/30/22 1452 04/30/22 1502   04/30/22 1515  amoxicillin-clavulanate (AUGMENTIN) 875-125 MG per tablet 1 tablet        1 tablet Oral Every 12 hours 04/30/22 1503 05/05/22 0959   04/30/22 1300  cefTRIAXone (ROCEPHIN) 1 g in sodium chloride 0.9 % 100 mL IVPB        1 g 200 mL/hr over 30 Minutes Intravenous  Once 04/30/22 1254 04/30/22 1425   04/30/22 1300  metroNIDAZOLE (FLAGYL) IVPB 500 mg  Status:  Discontinued        500 mg 100 mL/hr over 60 Minutes Intravenous  Once 04/30/22 1254 04/30/22 1502   04/30/22 1300   azithromycin (ZITHROMAX) 500 mg in sodium chloride 0.9 % 250 mL IVPB  Status:  Discontinued        500 mg 250 mL/hr over 60 Minutes Intravenous  Once 04/30/22 1254 04/30/22 1502              Family Communication/Anticipated D/C date and plan/Code Status   DVT prophylaxis: apixaban (ELIQUIS) tablet 2.5 mg Start: 05/02/22 2200 apixaban (ELIQUIS) tablet 2.5 mg     Code Status: DNR  Family Communication: None Disposition Plan: Plan to discharge home tomorrow   Status is: Inpatient Remains inpatient appropriate because: Hypoxia       Subjective:   Interval events noted.  She still complains of persistent cough congestion.  Breathing is a little better.  According to [facility], RN, attempt to wean her off of oxygen last night was unsuccessful because oxygen saturation dropped into the 80s on room air.  Objective:    Vitals:   05/03/22 0809 05/03/22 0831 05/03/22 1155 05/03/22 1406  BP: (!) 136/90  122/73   Pulse: 88 99 (!) 105 (!) 107  Resp: '16 18 19 16  '$ Temp: 98.1 F (36.7 C)  98.1 F (36.7 C)   TempSrc:      SpO2: 93% 93% 96% 93%  Weight:      Height:       No data found.   Intake/Output Summary (Last 24 hours) at 05/03/2022 1415 Last data filed at 05/02/2022 2100 Gross per 24 hour  Intake 240 ml  Output --  Net 240 ml   Filed Weights   05/01/22 1100 05/01/22 2247  Weight: 60.9 kg 59.9 kg    Exam:  GEN: NAD SKIN: Warm an d dry EYES: No pallor or icterus ENT: MMM CV: Irregular rate and rhythm, mildly tachycardic PULM: Coarse breath sounds, occasional wheezing ABD: soft, ND, NT, +BS CNS: AAO x 3, non focal EXT: No edema or tenderness    Data Reviewed:   I have personally reviewed following labs and imaging studies:  Labs: Labs show the following:   Basic Metabolic Panel: Recent Labs  Lab 04/30/22 1047 05/01/22 0459  NA 134* 138  K 3.5 4.3  CL 94* 99  CO2 29 33*  GLUCOSE 113* 108*  BUN 25* 19  CREATININE 1.17* 0.94  CALCIUM  8.9 8.4*   GFR Estimated Creatinine Clearance: 30.4 mL/min (by C-G formula based on SCr of 0.94 mg/dL). Liver Function Tests: No results for input(s): "AST", "ALT", "ALKPHOS", "BILITOT", "PROT", "ALBUMIN" in the last 168 hours. No results for input(s): "LIPASE", "AMYLASE" in the last 168 hours. No results for input(s): "AMMONIA" in the last 168  hours. Coagulation profile Recent Labs  Lab 05/01/22 0459  INR 1.3*    CBC: Recent Labs  Lab 04/30/22 1047 05/01/22 0459  WBC 9.0 5.8  HGB 13.0 11.6*  HCT 38.9 35.8*  MCV 99.2 101.1*  PLT 160 144*   Cardiac Enzymes: No results for input(s): "CKTOTAL", "CKMB", "CKMBINDEX", "TROPONINI" in the last 168 hours. BNP (last 3 results) No results for input(s): "PROBNP" in the last 8760 hours. CBG: No results for input(s): "GLUCAP" in the last 168 hours. D-Dimer: No results for input(s): "DDIMER" in the last 72 hours. Hgb A1c: No results for input(s): "HGBA1C" in the last 72 hours. Lipid Profile: No results for input(s): "CHOL", "HDL", "LDLCALC", "TRIG", "CHOLHDL", "LDLDIRECT" in the last 72 hours. Thyroid function studies: No results for input(s): "TSH", "T4TOTAL", "T3FREE", "THYROIDAB" in the last 72 hours.  Invalid input(s): "FREET3" Anemia work up: No results for input(s): "VITAMINB12", "FOLATE", "FERRITIN", "TIBC", "IRON", "RETICCTPCT" in the last 72 hours. Sepsis Labs: Recent Labs  Lab 04/30/22 1047 04/30/22 1403 04/30/22 1737 05/01/22 0459  PROCALCITON  --   --   --  <0.10  WBC 9.0  --   --  5.8  LATICACIDVEN  --  1.1 1.5  --     Microbiology Recent Results (from the past 240 hour(s))  Culture, blood (routine x 2)     Status: None (Preliminary result)   Collection Time: 04/30/22  2:03 PM   Specimen: BLOOD  Result Value Ref Range Status   Specimen Description BLOOD BLOOD LEFT ARM  Final   Special Requests   Final    BOTTLES DRAWN AEROBIC AND ANAEROBIC Blood Culture adequate volume   Culture   Final    NO GROWTH 3  DAYS Performed at [facility], [address] [place], [place] [address]    Report Status PENDING  Incomplete  Culture, blood (routine x 2)     Status: None (Preliminary result)   Collection Time: 04/30/22  2:03 PM   Specimen: BLOOD  Result Value Ref Range Status   Specimen Description BLOOD BLOOD RIGHT HAND  Final   Special Requests   Final    BOTTLES DRAWN AEROBIC AND ANAEROBIC Blood Culture results may not be optimal due to an inadequate volume of blood received in culture bottles   Culture   Final    NO GROWTH 3 DAYS Performed at [facility], [address] [place], [place] [address]    Report Status PENDING  Incomplete    Procedures and diagnostic studies:  No results found.             LOS: 3 days   Allison Novak  [facility]   Pager on [email or web address]. If 7PM-7AM, please contact night-coverage at [email or web address]     05/03/2022, 2:15 PM

## 2022-05-03 NOTE — TOC Initial Note (Signed)
Transition of Care [facility]) - Initial/Assessment Note    Patient Details  Name: Allison Novak MRN: [id number] Date of Birth: 1925-07-04  Transition of Care [facility]) CM/SW Contact:    Tiburcio Bash, LCSW Phone Number: 05/03/2022, 1:12 PM  Clinical Narrative:                  Patient from [facility].   Patient hx of being active with [facility], Patient hx of [facility].   TOC to follow for discharge planning recommendations.   Expected Discharge Plan:  (TBD)     Patient Goals and CMS Choice Patient states their goals for this hospitalization and ongoing recovery are:: to go home CMS Medicare.gov Compare Post Acute Care list provided to:: Patient Choice offered to / list presented to : Patient      Expected Discharge Plan and Services                                              Prior Living Arrangements/Services                       Activities of Daily Living Home Assistive Devices/Equipment: Gilford Rile (specify type) ADL Screening (condition at time of admission) Patient's cognitive ability adequate to safely complete daily activities?: Yes Is the patient deaf or have difficulty hearing?: No Does the patient have difficulty seeing, even when wearing glasses/contacts?: No Does the patient have difficulty concentrating, remembering, or making decisions?: No Patient able to express need for assistance with ADLs?: Yes Does the patient have difficulty dressing or bathing?: No Independently performs ADLs?: Yes (appropriate for developmental age) Does the patient have difficulty walking or climbing stairs?: Yes Weakness of Legs: Both Weakness of Arms/Hands: Both  Permission Sought/Granted                  Emotional Assessment              Admission diagnosis:  RSV bronchitis [J20.5] Sepsis due to pneumonia ([place]) [J18.9, A41.9] Sepsis with acute hypoxic respiratory failure without septic shock, due to unspecified  organism ([place]) [A41.9, R65.20, J96.01] Acute hypoxic respiratory failure ([place]) [J96.01] Patient Active Problem List   Diagnosis Date Noted   Sepsis due to pneumonia ([place]) 04/30/2022   Acute bronchitis due to respiratory syncytial virus (RSV) 04/30/2022   Aspiration pneumonia ([place]) 04/30/2022   Iron deficiency anemia 07/21/2021   Moderate pulmonary hypertension ([place]) 07/20/2021   Sepsis ([place]) 07/19/2021   Thrombocytopenia ([place]) 07/19/2021   Acute urinary retention 07/18/2021   History of pulmonary embolism 07/18/2021   Hypothermia 07/18/2021   Pleural effusion on right 07/18/2021   Chronic diastolic CHF (congestive heart failure) ([place]) 07/17/2021   HLD (hyperlipidemia) 07/17/2021   Pulmonary embolism ([place]) 07/17/2021   Stage 3a chronic kidney disease (CKD) ([place]) 07/17/2021   Hypothyroidism 07/17/2021   Acute respiratory failure with hypoxia ([place]) 07/17/2021   Anemia of chronic disease 06/07/2021   Permanent atrial fibrillation ([place]) 06/07/2021   Hyponatremia 06/07/2021   Acute on chronic combined systolic and diastolic CHF (congestive heart failure) ([place]) 06/07/2021   Atrial fibrillation, chronic ([place]) 08/04/2020   Acute exacerbation of CHF (congestive heart failure) ([place]) 08/04/2020   Shortness of breath 08/03/2020   Hypokalemia    Palpitations    Hyperlipidemia 02/16/2020   Atrial fibrillation with RVR ([place]) 02/16/2020  Hypoxia 02/16/2020   Acute CHF (congestive heart failure) ([place]) 02/16/2020   Chronic anticoagulation 02/16/2020   History of TIA (transient ischemic attack) 02/23/2017   Carotid artery disease ([place]) 02/23/2017   TIA (transient ischemic attack) 02/18/2017   Hiatal hernia with GERD without esophagitis 02/18/2017   Chest pain 06/23/2015   Essential hypertension 06/16/2015   Aortic atherosclerosis ([place]) 06/16/2015   PCP:  Adin Hector, MD Pharmacy:   [facility] [place], [place] - [address] [place] [address] Phone: [phone] Fax: [name] #[id number] Lorina Rabon, [place] - [place] AT [facility] [address] [place] [address] Phone: [phone] Fax: [phone]     Social Determinants of Health ([place]) Social History: [place]: No Food Insecurity (05/02/2022)  Housing: Low Risk  (05/02/2022)  Transportation Needs: No Transportation Needs (05/02/2022)  Utilities: Not At Risk (05/02/2022)  Tobacco Use: Low Risk  (04/30/2022)   SDOH Interventions:     Readmission Risk Interventions     No data to display

## 2022-05-03 NOTE — Plan of Care (Signed)
  Problem: Fluid Volume: Goal: Hemodynamic stability will improve Outcome: Progressing   

## 2022-05-03 NOTE — Progress Notes (Signed)
PULMONOLOGY         Date: 05/03/2022,   MRN# [id number] Allison Novak [birth year]     AdmissionWeight: 60.9 kg                 CurrentWeight: 59.9 kg  Referring provider: Dr Francine Graven   CHIEF COMPLAINT:   Sub-Acute Cough  HISTORY OF PRESENT ILLNESS  This is a pleasant 88yo F with hx of sub-acute cough x 2 weeks.  She has a history of A-fib, chronic diastolic dysfunction CHF last known LVEF of 55 to 60%, history of coronary artery disease, stage III chronic kidney disease, hypertension who presents to the ER from home [facility] for evaluation of worsening shortness of breath and a refractory cough which is wet sounding and raspy.Patient tested positive for RSV on 04/27/22 and has been on a Z-Pak for presumed pneumonia as well as a steroid taper.  Her symptoms have not improved and her cough is worse as well as shortness of breath and so EMS was called and she was found to have room air pulse oximetry of 87%.  She was placed on 2 L of oxygen with improvement in her pulse oximetry to 96%. She denies having any chest pain, no nausea, no vomiting, no dizziness, no lightheadedness, no fever, no chills, no leg swelling, no palpitations, no blurred vision no focal deficit.  Per ER physician she had a Tmax of 101 in the field and received Tylenol, she is tachycardic and tachypneic Chest x-ray reviewed by me shows right parahilar opacity may reflect pneumonia although central lung lesion not excluded. Right base collapse/consolidation again noted with small right pleural effusion.  Moderate hiatal hernia.  CT imaging CTA showed no evidence of pulmonary embolus. Endobronchial debris seen in the bilateral lower lobes and right middle lobe, causing complete right middle lobe collapse, findingsare likely due to aspiration. Bronchial wall thickening, mosaic attenuation and new scattered bilateral patchy consolidations and small nodular opacities, likely due to infectious bronchitis.  Recommend follow-up chest CT in 3 months to ensure resolution. Moderate right pleural effusion with associated atelectasis. Cardiomegaly with severe left main and three-vessel coronary artery calcifications. Large hiatal hernia. Aortic Atherosclerosis. PCCM consultation for further management. She does not use oxygen at home.   05/01/22- patient is improved, her renal function has normalized. She is requiring less medication and requiring less oxygen.  [name] granddaughter of patient came in to visit during my examination.   05/03/22- patient is on room air. She is coughing a lot during my evaluation.   PAST MEDICAL HISTORY   Past Medical History:  Diagnosis Date   A-fib ([place])    Anemia    Carotid artery stenosis    CHF (congestive heart failure) (HCC)    Chronic kidney disease    Coronary artery disease    Diverticulosis    GERD (gastroesophageal reflux disease)    Gout    Hiatal hernia    Hyperlipidemia    Hypertension    Osteoporosis    Pulmonary embolus (HCC)      SURGICAL HISTORY   Past Surgical History:  Procedure Laterality Date   ABDOMINAL HYSTERECTOMY     ANKLE SURGERY Left 2011   KNEE ARTHROSCOPY Right      FAMILY HISTORY   Family History  Problem Relation Age of Onset   CAD Mother    Hypertension Sister    COPD Brother      SOCIAL HISTORY   Social History  Tobacco Use   Smoking status: Never   Smokeless tobacco: Never  Substance Use Topics   Alcohol use: No    Alcohol/week: 0.0 standard drinks of alcohol   Drug use: No     MEDICATIONS    Home Medication:     Current Medication:  Current Facility-Administered Medications:    acetaminophen (TYLENOL) tablet 650 mg, 650 mg, Oral, Q6H PRN **OR** acetaminophen (TYLENOL) suppository 650 mg, 650 mg, Rectal, Q6H PRN, Agbata, Tochukwu, MD   allopurinol (ZYLOPRIM) tablet 100 mg, 100 mg, Oral, Daily, Agbata, Tochukwu, MD, 100 mg at 05/02/22 0908   amoxicillin-clavulanate (AUGMENTIN) 875-125  MG per tablet 1 tablet, 1 tablet, Oral, Q12H, Ottie Glazier, MD, 1 tablet at 05/02/22 2107   apixaban (ELIQUIS) tablet 2.5 mg, 2.5 mg, Oral, BID, Jennye Boroughs, MD, 2.5 mg at 05/02/22 2107   atorvastatin (LIPITOR) tablet 40 mg, 40 mg, Oral, Daily, Agbata, Tochukwu, MD, 40 mg at 05/02/22 0908   digoxin (LANOXIN) tablet 0.125 mg, 0.125 mg, Oral, Daily, Agbata, Tochukwu, MD, 0.125 mg at 05/02/22 0909   guaiFENesin-dextromethorphan (ROBITUSSIN DM) 100-10 MG/5ML syrup 5 mL, 5 mL, Oral, Q4H PRN, Jennye Boroughs, MD, 5 mL at 05/02/22 0913   ipratropium-albuterol (DUONEB) 0.5-2.5 (3) MG/3ML nebulizer solution 3 mL, 3 mL, Nebulization, Q6H PRN, Agbata, Tochukwu, MD   levothyroxine (SYNTHROID) tablet 25 mcg, 25 mcg, Oral, Q0600, Agbata, Tochukwu, MD, 25 mcg at 05/03/22 0603   methylPREDNISolone (MEDROL DOSEPAK) tablet 4 mg, 4 mg, Oral, 4X daily taper, Ottie Glazier, MD, 4 mg at 05/02/22 2110   metoprolol succinate (TOPROL-XL) 24 hr tablet 50 mg, 50 mg, Oral, Daily, Agbata, Tochukwu, MD, 50 mg at 05/02/22 0909   multivitamin with minerals tablet 1 tablet, 1 tablet, Oral, Daily, Agbata, Tochukwu, MD, 1 tablet at 05/02/22 0908   ondansetron (ZOFRAN) tablet 4 mg, 4 mg, Oral, Q6H PRN **OR** ondansetron (ZOFRAN) injection 4 mg, 4 mg, Intravenous, Q6H PRN, Agbata, Tochukwu, MD   pantoprazole (PROTONIX) EC tablet 40 mg, 40 mg, Oral, Daily, Agbata, Tochukwu, MD, 40 mg at 05/02/22 0908   potassium chloride (KLOR-CON M) CR tablet 10 mEq, 10 mEq, Oral, Daily, Agbata, Tochukwu, MD, 10 mEq at 05/02/22 0908   sodium chloride HYPERTONIC 3 % nebulizer solution 4 mL, 4 mL, Nebulization, BID, Agbata, Tochukwu, MD, 4 mL at 05/02/22 1832  Facility-Administered Medications Ordered in Other Encounters:    epoetin alfa-epbx (RETACRIT) injection 40,000 Units, 40,000 Units, Subcutaneous, Q21 days, Sindy Guadeloupe, MD, 40,000 Units at 09/06/21 1425    ALLERGIES   Keflex [cephalexin] and Macrodantin [nitrofurantoin  macrocrystal]     REVIEW OF SYSTEMS    Review of Systems:  Gen:  Denies  fever, sweats, chills weigh loss  HEENT: Denies blurred vision, double vision, ear pain, eye pain, hearing loss, nose bleeds, sore throat Cardiac:  No dizziness, chest pain or heaviness, chest tightness,edema Resp:   reports dyspnea chronically  Gi: Denies swallowing difficulty, stomach pain, nausea or vomiting, diarrhea, constipation, bowel incontinence Gu:  Denies bladder incontinence, burning urine Ext:   Denies Joint pain, stiffness or swelling Skin: Denies  skin rash, easy bruising or bleeding or hives Endoc:  Denies polyuria, polydipsia , polyphagia or weight change Psych:   Denies depression, insomnia or hallucinations   Other:  All other systems negative   VS: BP (!) 136/90 (BP Location: Right Arm)   Pulse 88   Temp 98.1 F (36.7 C)   Resp 16   Ht 5' 0.98" (1.549 m)   Wt 59.9 kg  SpO2 93%   BMI 24.97 kg/m      PHYSICAL EXAM    GENERAL:NAD, no fevers, chills, no weakness no fatigue HEAD: Normocephalic, atraumatic.  EYES: Pupils equal, round, reactive to light. Extraocular muscles intact. No scleral icterus.  MOUTH: Moist mucosal membrane. Dentition intact. No abscess noted.  EAR, NOSE, THROAT: Clear without exudates. No external lesions.  NECK: Supple. No thyromegaly. No nodules. No JVD.  PULMONARY: decreased breath sounds with mild rhonchi worse at bases bilaterally.  CARDIOVASCULAR: S1 and S2. Regular rate and rhythm. No murmurs, rubs, or gallops. No edema. Pedal pulses 2+ bilaterally.  GASTROINTESTINAL: Soft, nontender, nondistended. No masses. Positive bowel sounds. No hepatosplenomegaly.  MUSCULOSKELETAL: No swelling, clubbing, or edema. Range of motion full in all extremities.  NEUROLOGIC: Cranial nerves II through XII are intact. No gross focal neurological deficits. Sensation intact. Reflexes intact.  SKIN: No ulceration, lesions, rashes, or cyanosis. Skin warm and dry. Turgor  intact.  PSYCHIATRIC: Mood, affect within normal limits. The patient is awake, alert and oriented x 3. Insight, judgment intact.       IMAGING     ASSESSMENT/PLAN   Acute hypoxemic respiratory failure - present on admission  - COVID19 negative -RSV +  - supplemental O2 during my evaluation 2L/[place] - patient denies chest pain d/c planning  -please encourage patient to use incentive spirometer few times each hour while hospitalized.   -can dc IV Zithromax, and IV Rocephin and Flagyl, and can do PO augmentin. Can DC soumedrol and continue prednisone with medrol dose pack -patient tolerating hypertonic saline   Right pleural effusion  - chronic effusion associated with rounded atelectasis    Thank you for allowing me to participate in the care of this patient.   Patient/Family are satisfied with care plan and all questions have been answered.    Provider disclosure: Patient with at least one acute or chronic illness or injury that poses a threat to life or bodily function and is being managed actively during this encounter.  All of the below services have been performed independently by signing provider:  review of prior documentation from internal and or external health records.  Review of previous and current lab results.  Interview and comprehensive assessment during patient visit today. Review of current and previous chest radiographs/CT scans. Discussion of management and test interpretation with health care team and patient/family.   This document was prepared using Dragon voice recognition software and may include unintentional dictation errors.     Ottie Glazier, M.D.  Division of Pulmonary & Critical Care Medicine

## 2022-05-04 DIAGNOSIS — I4891 Unspecified atrial fibrillation: Secondary | ICD-10-CM

## 2022-05-04 DIAGNOSIS — J9601 Acute respiratory failure with hypoxia: Secondary | ICD-10-CM | POA: Diagnosis not present

## 2022-05-04 DIAGNOSIS — J205 Acute bronchitis due to respiratory syncytial virus: Secondary | ICD-10-CM | POA: Diagnosis not present

## 2022-05-04 DIAGNOSIS — J189 Pneumonia, unspecified organism: Secondary | ICD-10-CM | POA: Diagnosis not present

## 2022-05-04 MED ORDER — AMOXICILLIN-POT CLAVULANATE 875-125 MG PO TABS: 1.0000 | ORAL_TABLET | Freq: Two times a day (BID) | ORAL | 0 refills | Status: AC

## 2022-05-04 MED ORDER — TORSEMIDE 20 MG PO TABS: 40.0000 mg | ORAL_TABLET | Freq: Every day | ORAL | Status: DC

## 2022-05-04 MED ORDER — METHYLPREDNISOLONE 4 MG PO TBPK: ORAL_TABLET | Freq: Two times a day (BID) | ORAL | 0 refills | Status: AC

## 2022-05-04 MED ORDER — ACETYLCYSTEINE 20 % IN SOLN
3.0000 mL | Freq: Three times a day (TID) | RESPIRATORY_TRACT | Status: DC
  Filled 2022-05-04 (×2): qty 4

## 2022-05-04 MED ORDER — ACETYLCYSTEINE 20 % IN SOLN
3.0000 mL | Freq: Three times a day (TID) | RESPIRATORY_TRACT | Status: DC
  Filled 2022-05-04: qty 4

## 2022-05-04 NOTE — NC FL2 (Signed)
[place] LEVEL OF CARE FORM     IDENTIFICATION  Patient Name: Allison Novak Birthdate: 02-Jul-1925 Sex: female Admission Date (Current Location): 04/30/2022  [facility] and [place] Number:  Engineering geologist and Address:  [facility], [address] [place], [place] [address]      Provider Number: [id number]  Attending Physician Name and Address:  Jennye Boroughs, MD  Relative Name and Phone Number:  Dalton,Carolyn S (Daughter) [phone]    Current Level of Care: Hospital Recommended Level of Care: Assisted Living Facility Prior Approval Number:    Date Approved/Denied:   PASRR Number:    Discharge Plan: Domiciliary (Rest home)    Current Diagnoses: Patient Active Problem List   Diagnosis Date Noted   Sepsis due to pneumonia ([place]) 04/30/2022   Acute bronchitis due to respiratory syncytial virus (RSV) 04/30/2022   Aspiration pneumonia ([place]) 04/30/2022   Iron deficiency anemia 07/21/2021   Moderate pulmonary hypertension ([place]) 07/20/2021   Sepsis ([place]) 07/19/2021   Thrombocytopenia ([place]) 07/19/2021   Acute urinary retention 07/18/2021   History of pulmonary embolism 07/18/2021   Hypothermia 07/18/2021   Pleural effusion on right 07/18/2021   Chronic diastolic CHF (congestive heart failure) ([place]) 07/17/2021   HLD (hyperlipidemia) 07/17/2021   Pulmonary embolism ([place]) 07/17/2021   Stage 3a chronic kidney disease (CKD) ([place]) 07/17/2021   Hypothyroidism 07/17/2021   Acute respiratory failure with hypoxia ([place]) 07/17/2021   Anemia of chronic disease 06/07/2021   Permanent atrial fibrillation ([place]) 06/07/2021   Hyponatremia 06/07/2021   Acute on chronic combined systolic and diastolic CHF (congestive heart failure) ([place]) 06/07/2021   Atrial fibrillation, chronic ([place]) 08/04/2020   Acute exacerbation of CHF (congestive heart failure) ([place]) 08/04/2020   Shortness of breath 08/03/2020   Hypokalemia    Palpitations     Hyperlipidemia 02/16/2020   Atrial fibrillation with RVR ([place]) 02/16/2020   Hypoxia 02/16/2020   Acute CHF (congestive heart failure) ([place]) 02/16/2020   Chronic anticoagulation 02/16/2020   History of TIA (transient ischemic attack) 02/23/2017   Carotid artery disease ([place]) 02/23/2017   TIA (transient ischemic attack) 02/18/2017   Hiatal hernia with GERD without esophagitis 02/18/2017   Chest pain 06/23/2015   Essential hypertension 06/16/2015   Aortic atherosclerosis ([place]) 06/16/2015    Orientation RESPIRATION BLADDER Height & Weight     Self, Time, Situation, Place  Normal   Weight: 132 lb 1.6 oz (59.9 kg) Height:  5' 0.98" (154.9 cm)  BEHAVIORAL SYMPTOMS/MOOD NEUROLOGICAL BOWEL NUTRITION STATUS        Diet (regular)  AMBULATORY STATUS COMMUNICATION OF NEEDS Skin   Limited Assist Verbally Normal                       Personal Care Assistance Level of Assistance  Bathing, Feeding, Dressing Bathing Assistance: Independent Feeding assistance: Independent Dressing Assistance: Limited assistance     Functional Limitations Info  Sight, Hearing, Speech Sight Info: Adequate Hearing Info: Adequate Speech Info: Adequate    SPECIAL CARE FACTORS FREQUENCY                       Contractures Contractures Info: Not present    Additional Factors Info  Code Status, Allergies Code Status Info: DNR Allergies Info: Keflex (Cephalexin)  Macrodantin (Nitrofurantoin Macrocrystal)           Discharge Medications: Please see discharge summary for a list of discharge medications.    TAKE these medications  albuterol 1.25 MG/3ML nebulizer solution Commonly known as: ACCUNEB Take 1 ampule by nebulization every 6 (six) hours as needed for wheezing.    albuterol 108 (90 Base) MCG/ACT inhaler Commonly known as: VENTOLIN HFA Inhale 2 puffs into the lungs every 6 (six) hours as needed for wheezing or shortness of breath.    allopurinol 100 MG tablet Commonly known  as: ZYLOPRIM Take 100 mg by mouth daily.    amoxicillin-clavulanate 875-125 MG tablet Commonly known as: AUGMENTIN Take 1 tablet by mouth every 12 (twelve) hours for 2 doses.    atorvastatin 40 MG tablet Commonly known as: LIPITOR Take 40 mg by mouth daily.    Azelastine-Fluticasone 137-50 MCG/ACT Susp Place 1 puff into the nose daily.    calcium carbonate 1500 (600 Ca) MG Tabs tablet Commonly known as: OSCAL Take 1 tablet by mouth in the morning and at bedtime.    Cholecalciferol 50 MCG (2000 UT) Caps Take 2,000 Units by mouth daily.    digoxin 0.125 MG tablet Commonly known as: LANOXIN Take 0.0625 mg by mouth daily.    Eliquis 2.5 MG Tabs tablet Generic drug: apixaban Take 2.5 mg by mouth 2 (two) times daily.    esomeprazole 40 MG capsule Commonly known as: NEXIUM Take 40 mg by mouth daily.    levothyroxine 25 MCG tablet Commonly known as: SYNTHROID Take 25 mcg by mouth daily.    methylPREDNISolone 4 MG Tbpk tablet Commonly known as: MEDROL DOSEPAK Take by mouth 2 (two) times daily for 3 doses.    metoprolol succinate 50 MG 24 hr tablet Commonly known as: TOPROL-XL Take 1 tablet (50 mg total) by mouth daily. Take with or immediately following a meal.    Multi-Vitamin tablet Take 1 tablet by mouth daily.    polyvinyl alcohol 1.4 % ophthalmic solution Commonly known as: LIQUIFILM TEARS Place 2 drops into the left eye every 8 (eight) hours as needed for dry eyes.    Potassium Chloride ER 20 MEQ Tbcr Take 20 mEq by mouth daily.    torsemide 20 MG tablet Commonly known as: DEMADEX Take 2 tablets (40 mg total) by mouth daily.       Relevant Imaging Results:  Relevant Lab Results:   Additional Information :[id number]  Gerilyn Pilgrim, LCSW

## 2022-05-04 NOTE — Plan of Care (Signed)

## 2022-05-04 NOTE — TOC Transition Note (Signed)
Transition of Care [facility]) - CM/SW Discharge Note   Patient Details  Name: Allison Novak MRN: [id number] Date of Birth: 04-15-1926  Transition of Care [facility]) CM/SW Contact:  Gerilyn Pilgrim, LCSW Phone Number: 05/04/2022, 12:19 PM   Clinical Narrative:  DC orders and summary in. Pt discharging today [facility]. FL2 and DC summary sent to home place. Son will transport. CSW signing off.     Final next level of care: Assisted Living Barriers to Discharge: Barriers Resolved   Patient Goals and CMS Choice CMS Medicare.gov Compare Post Acute Care list provided to:: Patient Choice offered to / list presented to : Patient  Discharge Placement                Patient chooses bed at:  ([facility])        Discharge Plan and Services Additional resources added to the After Visit Summary for                                       Social Determinants of Health (SDOH) Interventions [place]: No Food Insecurity (05/02/2022)  Housing: Low Risk  (05/02/2022)  Transportation Needs: No Transportation Needs (05/02/2022)  Utilities: Not At Risk (05/02/2022)  Tobacco Use: Low Risk  (04/30/2022)     Readmission Risk Interventions     No data to display

## 2022-05-04 NOTE — Discharge Summary (Signed)
Physician Discharge Summary   Patient: Allison Novak MRN: [id number] DOB: 10-Jun-1925  Admit date:     04/30/2022  Discharge date: 05/04/22  Discharge Physician: Jennye Boroughs   PCP: Adin Hector, MD   Recommendations at discharge:   Follow-up with PCP in 1 week Follow-up with Dr. Lanney Gins, pulmonologist, in 1 to 2 weeks  Discharge Diagnoses: Principal Problem:   Sepsis due to pneumonia [facility]) Active Problems:   Chronic diastolic CHF (congestive heart failure) ([place])   Acute respiratory failure with hypoxia (HCC)   Acute bronchitis due to respiratory syncytial virus (RSV)   Aspiration pneumonia ([place])   Stage 3a chronic kidney disease (CKD) ([place])   Hiatal hernia with GERD without esophagitis   Essential hypertension   Atrial fibrillation with RVR ([place])   Pulmonary embolism (HCC)   Hypothyroidism  Resolved Problems:   * No resolved hospital problems. [facility] Course:  Ms. Allison Novak is a 88 y.o. female  with medical history significant for permanent A-fib, chronic diastolic dysfunction CHF last known LVEF of 55 to 60%, history of coronary artery disease, stage III chronic kidney disease, hypertension who presents to the [facility] from [facility] for evaluation of worsening shortness of breath and a refractory cough which is wet sounding and raspy. Patient tested positive for RSV on 04/27/22 and has been on a Z-Pak for presumed pneumonia as well as a steroid taper.  His symptoms did not improve.  Cough and shortness of breath worsened so EMS was called.  She was found to have room air pulse oximetry of 87%.  She was placed on 2 L of oxygen with improvement in her pulse oximetry to 96%.     She was admitted to the hospital for sepsis secondary to RSV pneumonia complicated by acute hypoxic respiratory failure.  She was treated with empiric IV antibiotics and was transitioned to oral Augmentin.  She was also treated with methylprednisone and Robitussin.  Her  condition slowly improved and she has been successfully weaned off of oxygen.  She is deemed stable for discharge to the assisted living facility today.    Assessment and Plan:   Sepsis secondary to RSV bronchitis, aspiration pneumonia: Discharged on Augmentin and methylprednisone.  Robitussin as needed for cough.   Acute hypoxic respiratory failure: Resolved.  She is tolerating room air.   Chronic diastolic CHF: Compensated.   Atrial fibrillation with RVR: Continue metoprolol, digoxin and Eliquis.   History of pulmonary embolism: Continue Eliquis   Other comorbidities include advanced age, CKD stage IIIa, hypothyroidism, hypertension, hiatal hernia with GERD         Consultants: Pulmonologist Procedures performed: none  Disposition: Assisted living Diet recommendation:  Discharge Diet Orders (From admission, onward)     Start     Ordered   05/04/22 0000  Diet - low sodium heart healthy        05/04/22 1057           Cardiac diet DISCHARGE MEDICATION: Allergies as of 05/04/2022       Reactions   Keflex [cephalexin] Other (See Comments)   Pt cant remember exact reaction   Macrodantin [nitrofurantoin Macrocrystal] Other (See Comments)        Medication List     STOP taking these medications    azithromycin 250 MG tablet Commonly known as: ZITHROMAX   polyethylene glycol powder 17 GM/SCOOP powder Commonly known as: GLYCOLAX/MIRALAX   predniSONE 10 MG tablet Commonly known as: DELTASONE  TAKE these medications    albuterol 1.25 MG/3ML nebulizer solution Commonly known as: ACCUNEB Take 1 ampule by nebulization every 6 (six) hours as needed for wheezing.   albuterol 108 (90 Base) MCG/ACT inhaler Commonly known as: VENTOLIN HFA Inhale 2 puffs into the lungs every 6 (six) hours as needed for wheezing or shortness of breath.   allopurinol 100 MG tablet Commonly known as: ZYLOPRIM Take 100 mg by mouth daily.   amoxicillin-clavulanate  875-125 MG tablet Commonly known as: AUGMENTIN Take 1 tablet by mouth every 12 (twelve) hours for 2 doses.   atorvastatin 40 MG tablet Commonly known as: LIPITOR Take 40 mg by mouth daily.   Azelastine-Fluticasone 137-50 MCG/ACT Susp Place 1 puff into the nose daily.   calcium carbonate 1500 (600 Ca) MG Tabs tablet Commonly known as: OSCAL Take 1 tablet by mouth in the morning and at bedtime.   Cholecalciferol 50 MCG (2000 UT) Caps Take 2,000 Units by mouth daily.   digoxin 0.125 MG tablet Commonly known as: LANOXIN Take 0.0625 mg by mouth daily.   Eliquis 2.5 MG Tabs tablet Generic drug: apixaban Take 2.5 mg by mouth 2 (two) times daily.   esomeprazole 40 MG capsule Commonly known as: NEXIUM Take 40 mg by mouth daily.   levothyroxine 25 MCG tablet Commonly known as: SYNTHROID Take 25 mcg by mouth daily.   methylPREDNISolone 4 MG Tbpk tablet Commonly known as: MEDROL DOSEPAK Take by mouth 2 (two) times daily for 3 doses.   metoprolol succinate 50 MG 24 hr tablet Commonly known as: TOPROL-XL Take 1 tablet (50 mg total) by mouth daily. Take with or immediately following a meal.   Multi-Vitamin tablet Take 1 tablet by mouth daily.   polyvinyl alcohol 1.4 % ophthalmic solution Commonly known as: LIQUIFILM TEARS Place 2 drops into the left eye every 8 (eight) hours as needed for dry eyes.   Potassium Chloride ER 20 MEQ Tbcr Take 20 mEq by mouth daily.   torsemide 20 MG tablet Commonly known as: DEMADEX Take 2 tablets (40 mg total) by mouth daily.        Discharge Exam: Filed Weights   05/01/22 1100 05/01/22 2247  Weight: 60.9 kg 59.9 kg   GEN: NAD SKIN: Warm and dry EYES: No pallor or icterus ENT: MMM CV: RRR PULM: CTA B ABD: soft, ND, NT, +BS CNS: AAO x 3, non focal EXT: No edema or tenderness   Condition at discharge: good  The results of significant diagnostics from this hospitalization (including imaging, microbiology, ancillary and  laboratory) are listed below for reference.   Imaging Studies: CT Angio Chest PE W/Cm &/Or Wo Cm  Result Date: 04/30/2022 CLINICAL DATA:  Cough and shortness of breath EXAM: CT ANGIOGRAPHY CHEST WITH CONTRAST TECHNIQUE: Multidetector CT imaging of the chest was performed using the standard protocol during bolus administration of intravenous contrast. Multiplanar CT image reconstructions and MIPs were obtained to evaluate the vascular anatomy. RADIATION DOSE REDUCTION: This exam was performed according to the departmental dose-optimization program which includes automated exposure control, adjustment of the mA and/or kV according to patient size and/or use of iterative reconstruction technique. CONTRAST:  13m OMNIPAQUE IOHEXOL 350 MG/ML SOLN COMPARISON:  Chest CT dated June 07, 2019; PET-CT dated August 13, 2021 FINDINGS: Cardiovascular: No evidence of pulmonary embolus. Cardiomegaly. Trace pericardial effusion. Normal caliber thoracic aorta with severe atherosclerotic disease. Severe left main and three-vessel coronary artery calcifications. Dilated main pulmonary artery, measuring up to 3.4 cm. Mediastinum/Nodes: Large hiatal hernia. Unchanged  mildly enlarged mediastinal lymph nodes, reference precarinal lymph node measuring 10 mm in short axis on series 4, image 51, likely reactive. Lungs/Pleura: Bilateral bronchial wall thickening. Bilateral mosaic attenuation. Scattered endobronchial debris seen in the bilateral lower lobes and right middle lobe, which is most severe in the right middle lobe causing complete right middle lobe collapse. New scattered bilateral patchy consolidations and small nodular opacities. Reference solid nodule of the left lower lobe measuring 5 mm on series 6, image 209 moderate right pleural effusion with associated atelectasis. Upper Abdomen: No acute abnormality. Musculoskeletal: No chest wall abnormality. No acute or significant osseous findings. Review of the MIP images  confirms the above findings. IMPRESSION: 1. No evidence of pulmonary embolus. 2. Endobronchial debris seen in the bilateral lower lobes and right middle lobe, causing complete right middle lobe collapse, findings are likely due to aspiration. 3. Bronchial wall thickening, mosaic attenuation and new scattered bilateral patchy consolidations and small nodular opacities, likely due to infectious bronchitis. Recommend follow-up chest CT in 3 months to ensure resolution. 4. Moderate right pleural effusion with associated atelectasis. 5. Cardiomegaly with severe left main and three-vessel coronary artery calcifications. 6. Large hiatal hernia. 7. Aortic Atherosclerosis (ICD10-I70.0). Electronically Signed   By: Yetta Glassman M.D.   On: 04/30/2022 12:45   DG Chest 2 View  Result Date: 04/30/2022 CLINICAL DATA:  Chest pain EXAM: CHEST - 2 VIEW COMPARISON:  07/23/2021 FINDINGS: Lowi 14 hours. The cardio pericardial silhouette is enlarged. Interstitial markings are diffusely coarsened with chronic features. Right parahilar opacity may reflect pneumonia although central lung lesion not excluded. There is persistent right base collapse/consolidation with small right pleural effusion. Moderate hiatal hernia noted. Bones are diffusely demineralized. Telemetry leads overlie the chest. IMPRESSION: 1. Right parahilar opacity may reflect pneumonia although central lung lesion not excluded. 2. Right base collapse/consolidation again noted with small right pleural effusion. 3. Moderate hiatal hernia. Electronically Signed   By: Misty Stanley M.D.   On: 04/30/2022 11:29    Microbiology: Results for orders placed or performed during the hospital encounter of 04/30/22  Culture, blood (routine x 2)     Status: None (Preliminary result)   Collection Time: 04/30/22  2:03 PM   Specimen: BLOOD  Result Value Ref Range Status   Specimen Description BLOOD BLOOD LEFT ARM  Final   Special Requests   Final    BOTTLES DRAWN AEROBIC  AND ANAEROBIC Blood Culture adequate volume   Culture   Final    NO GROWTH 4 DAYS Performed at [facility], [address], [place], [place] [address]    Report Status PENDING  Incomplete  Culture, blood (routine x 2)     Status: None (Preliminary result)   Collection Time: 04/30/22  2:03 PM   Specimen: BLOOD  Result Value Ref Range Status   Specimen Description BLOOD BLOOD RIGHT HAND  Final   Special Requests   Final    BOTTLES DRAWN AEROBIC AND ANAEROBIC Blood Culture results may not be optimal due to an inadequate volume of blood received in culture bottles   Culture   Final    NO GROWTH 4 DAYS Performed at [facility], [address] [place],  [address]    Report Status PENDING  Incomplete    Labs: CBC: Recent Labs  Lab 04/30/22 1047 05/01/22 0459  WBC 9.0 5.8  HGB 13.0 11.6*  HCT 38.9 35.8*  MCV 99.2 101.1*  PLT 160 875*   Basic Metabolic Panel: Recent Labs  Lab 04/30/22 1047 05/01/22 0459  NA 134* 138  K 3.5 4.3  CL 94* 99  CO2 29 33*  GLUCOSE 113* 108*  BUN 25* 19  CREATININE 1.17* 0.94  CALCIUM 8.9 8.4*   Liver Function Tests: No results for input(s): "AST", "ALT", "ALKPHOS", "BILITOT", "PROT", "ALBUMIN" in the last 168 hours. CBG: No results for input(s): "GLUCAP" in the last 168 hours.  Discharge time spent: greater than 30 minutes.  Signed: Jennye Boroughs, MD [facility] 05/04/2022

## 2022-05-04 NOTE — Plan of Care (Signed)
  Problem: Fluid Volume: Goal: Hemodynamic stability will improve 05/04/2022 1102 by Shauna Hugh, RN Outcome: Adequate for Discharge 05/04/2022 0804 by Shauna Hugh, RN Outcome: Progressing   Problem: Clinical Measurements: Goal: Diagnostic test results will improve 05/04/2022 1102 by Shauna Hugh, RN Outcome: Adequate for Discharge 05/04/2022 0804 by Shauna Hugh, RN Outcome: Progressing Goal: Signs and symptoms of infection will decrease 05/04/2022 1102 by Shauna Hugh, RN Outcome: Adequate for Discharge 05/04/2022 0804 by Shauna Hugh, RN Outcome: Progressing   Problem: Respiratory: Goal: Ability to maintain adequate ventilation will improve 05/04/2022 1102 by Shauna Hugh, RN Outcome: Adequate for Discharge 05/04/2022 0804 by Shauna Hugh, RN Outcome: Progressing   Problem: Education: Goal: Knowledge of General Education information will improve Description: Including pain rating scale, medication(s)/side effects and non-pharmacologic comfort measures 05/04/2022 1102 by Shauna Hugh, RN Outcome: Adequate for Discharge 05/04/2022 0804 by Shauna Hugh, RN Outcome: Progressing   Problem: Health Behavior/Discharge Planning: Goal: Ability to manage health-related needs will improve 05/04/2022 1102 by Shauna Hugh, RN Outcome: Adequate for Discharge 05/04/2022 0804 by Shauna Hugh, RN Outcome: Progressing   Problem: Clinical Measurements: Goal: Ability to maintain clinical measurements within normal limits will improve 05/04/2022 1102 by Shauna Hugh, RN Outcome: Adequate for Discharge 05/04/2022 0804 by Shauna Hugh, RN Outcome: Progressing Goal: Will remain free from infection 05/04/2022 1102 by Shauna Hugh, RN Outcome: Adequate for Discharge 05/04/2022 0804 by Shauna Hugh, RN Outcome: Progressing Goal: Diagnostic test results will improve 05/04/2022 1102 by Shauna Hugh, RN Outcome: Adequate for Discharge 05/04/2022 0804 by Shauna Hugh,  RN Outcome: Progressing Goal: Respiratory complications will improve 05/04/2022 1102 by Shauna Hugh, RN Outcome: Adequate for Discharge 05/04/2022 0804 by Shauna Hugh, RN Outcome: Progressing Goal: Cardiovascular complication will be avoided 05/04/2022 1102 by Shauna Hugh, RN Outcome: Adequate for Discharge 05/04/2022 0804 by Shauna Hugh, RN Outcome: Progressing   Problem: Activity: Goal: Risk for activity intolerance will decrease 05/04/2022 1102 by Shauna Hugh, RN Outcome: Adequate for Discharge 05/04/2022 0804 by Shauna Hugh, RN Outcome: Progressing   Problem: Nutrition: Goal: Adequate nutrition will be maintained 05/04/2022 1102 by Shauna Hugh, RN Outcome: Adequate for Discharge 05/04/2022 0804 by Shauna Hugh, RN Outcome: Progressing   Problem: Coping: Goal: Level of anxiety will decrease 05/04/2022 1102 by Shauna Hugh, RN Outcome: Adequate for Discharge 05/04/2022 0804 by Shauna Hugh, RN Outcome: Progressing   Problem: Elimination: Goal: Will not experience complications related to bowel motility 05/04/2022 1102 by Shauna Hugh, RN Outcome: Adequate for Discharge 05/04/2022 0804 by Shauna Hugh, RN Outcome: Progressing Goal: Will not experience complications related to urinary retention 05/04/2022 1102 by Shauna Hugh, RN Outcome: Adequate for Discharge 05/04/2022 0804 by Shauna Hugh, RN Outcome: Progressing   Problem: Pain Managment: Goal: General experience of comfort will improve 05/04/2022 1102 by Shauna Hugh, RN Outcome: Adequate for Discharge 05/04/2022 0804 by Shauna Hugh, RN Outcome: Progressing   Problem: Safety: Goal: Ability to remain free from injury will improve 05/04/2022 1102 by Shauna Hugh, RN Outcome: Adequate for Discharge 05/04/2022 0804 by Shauna Hugh, RN Outcome: Progressing   Problem: Skin Integrity: Goal: Risk for impaired skin integrity will decrease 05/04/2022 1102 by Shauna Hugh,  RN Outcome: Adequate for Discharge 05/04/2022 0804 by Shauna Hugh, RN Outcome: Progressing

## 2022-05-04 NOTE — Progress Notes (Signed)
PULMONOLOGY         Date: 05/04/2022,   MRN# [id number] Allison Novak [birth year]     AdmissionWeight: 60.9 kg                 CurrentWeight: 59.9 kg  Referring provider: Dr Francine Graven   CHIEF COMPLAINT:   Sub-Acute Cough  HISTORY OF PRESENT ILLNESS  This is a pleasant 88yo F with hx of sub-acute cough x 2 weeks.  She has a history of A-fib, chronic diastolic dysfunction CHF last known LVEF of 55 to 60%, history of coronary artery disease, stage III chronic kidney disease, hypertension who presents to the ER from home [facility] [place] for evaluation of worsening shortness of breath and a refractory cough which is wet sounding and raspy.Patient tested positive for RSV on 04/27/22 and has been on a Z-Pak for presumed pneumonia as well as a steroid taper.  Her symptoms have not improved and her cough is worse as well as shortness of breath and so EMS was called and she was found to have room air pulse oximetry of 87%.  She was placed on 2 L of oxygen with improvement in her pulse oximetry to 96%. She denies having any chest pain, no nausea, no vomiting, no dizziness, no lightheadedness, no fever, no chills, no leg swelling, no palpitations, no blurred vision no focal deficit.  Per ER physician she had a Tmax of 101 in the field and received Tylenol, she is tachycardic and tachypneic Chest x-ray reviewed by me shows right parahilar opacity may reflect pneumonia although central lung lesion not excluded. Right base collapse/consolidation again noted with small right pleural effusion.  Moderate hiatal hernia.  CT imaging CTA showed no evidence of pulmonary embolus. Endobronchial debris seen in the bilateral lower lobes and right middle lobe, causing complete right middle lobe collapse, findingsare likely due to aspiration. Bronchial wall thickening, mosaic attenuation and new scattered bilateral patchy consolidations and small nodular opacities, likely due to infectious bronchitis.  Recommend follow-up chest CT in 3 months to ensure resolution. Moderate right pleural effusion with associated atelectasis. Cardiomegaly with severe left main and three-vessel coronary artery calcifications. Large hiatal hernia. Aortic Atherosclerosis. PCCM consultation for further management. She does not use oxygen at home.   05/01/22- patient is improved, her renal function has normalized. She is requiring less medication and requiring less oxygen.  Allison Novak granddaughter of patient came in to visit during my examination.   05/03/22- patient is on room air. She is coughing a lot during my evaluation.  05/04/22-patient is cleared for dc home today with outpatient follow up within 2 wks PAST MEDICAL HISTORY   Past Medical History:  Diagnosis Date   A-fib ([place])    Anemia    Carotid artery stenosis    CHF (congestive heart failure) (HCC)    Chronic kidney disease    Coronary artery disease    Diverticulosis    GERD (gastroesophageal reflux disease)    Gout    Hiatal hernia    Hyperlipidemia    Hypertension    Osteoporosis    Pulmonary embolus (HCC)      SURGICAL HISTORY   Past Surgical History:  Procedure Laterality Date   ABDOMINAL HYSTERECTOMY     ANKLE SURGERY Left 2011   KNEE ARTHROSCOPY Right      FAMILY HISTORY   Family History  Problem Relation Age of Onset   CAD Mother    Hypertension Sister    COPD Brother  SOCIAL HISTORY   Social History   Tobacco Use   Smoking status: Never   Smokeless tobacco: Never  Substance Use Topics   Alcohol use: No    Alcohol/week: 0.0 standard drinks of alcohol   Drug use: No     MEDICATIONS    Home Medication:     Current Medication:  Current Facility-Administered Medications:    acetaminophen (TYLENOL) tablet 650 mg, 650 mg, Oral, Q6H PRN **OR** acetaminophen (TYLENOL) suppository 650 mg, 650 mg, Rectal, Q6H PRN, Agbata, Tochukwu, MD   acetylcysteine (MUCOMYST) 20 % nebulizer / oral solution 3 mL, 3 mL,  Nebulization, TID, Jennye Boroughs, MD   albuterol (PROVENTIL) (2.5 MG/3ML) 0.083% nebulizer solution 2.5 mg, 2.5 mg, Nebulization, Q4H PRN, Jennye Boroughs, MD   allopurinol (ZYLOPRIM) tablet 100 mg, 100 mg, Oral, Daily, Agbata, Tochukwu, MD, 100 mg at 05/03/22 1018   amoxicillin-clavulanate (AUGMENTIN) 875-125 MG per tablet 1 tablet, 1 tablet, Oral, Q12H, Ottie Glazier, MD, 1 tablet at 05/03/22 2100   apixaban (ELIQUIS) tablet 2.5 mg, 2.5 mg, Oral, BID, Jennye Boroughs, MD, 2.5 mg at 05/03/22 2100   atorvastatin (LIPITOR) tablet 40 mg, 40 mg, Oral, Daily, Agbata, Tochukwu, MD, 40 mg at 05/03/22 1019   digoxin (LANOXIN) tablet 0.125 mg, 0.125 mg, Oral, Daily, Agbata, Tochukwu, MD, 0.125 mg at 05/03/22 1026   guaiFENesin-dextromethorphan (ROBITUSSIN DM) 100-10 MG/5ML syrup 5 mL, 5 mL, Oral, Q4H PRN, Jennye Boroughs, MD, 5 mL at 05/03/22 2230   ipratropium-albuterol (DUONEB) 0.5-2.5 (3) MG/3ML nebulizer solution 3 mL, 3 mL, Nebulization, TID, Jennye Boroughs, MD, 3 mL at 05/04/22 0800   levothyroxine (SYNTHROID) tablet 25 mcg, 25 mcg, Oral, Q0600, Agbata, Tochukwu, MD, 25 mcg at 05/04/22 0514   methylPREDNISolone (MEDROL DOSEPAK) tablet 4 mg, 4 mg, Oral, 4X daily taper, Ottie Glazier, MD, 4 mg at 05/03/22 2100   metoprolol succinate (TOPROL-XL) 24 hr tablet 50 mg, 50 mg, Oral, Daily, Agbata, Tochukwu, MD, 50 mg at 05/03/22 1018   multivitamin with minerals tablet 1 tablet, 1 tablet, Oral, Daily, Agbata, Tochukwu, MD, 1 tablet at 05/03/22 1018   ondansetron (ZOFRAN) tablet 4 mg, 4 mg, Oral, Q6H PRN **OR** ondansetron (ZOFRAN) injection 4 mg, 4 mg, Intravenous, Q6H PRN, Agbata, Tochukwu, MD   pantoprazole (PROTONIX) EC tablet 40 mg, 40 mg, Oral, Daily, Agbata, Tochukwu, MD, 40 mg at 05/03/22 1018   potassium chloride (KLOR-CON M) CR tablet 10 mEq, 10 mEq, Oral, Daily, Agbata, Tochukwu, MD, 10 mEq at 05/03/22 1019  Facility-Administered Medications Ordered in Other Encounters:    epoetin alfa-epbx  (RETACRIT) injection 40,000 Units, 40,000 Units, Subcutaneous, Q21 days, Sindy Guadeloupe, MD, 40,000 Units at 09/06/21 1425    ALLERGIES   Keflex [cephalexin] and Macrodantin [nitrofurantoin macrocrystal]     REVIEW OF SYSTEMS    Review of Systems:  Gen:  Denies  fever, sweats, chills weigh loss  HEENT: Denies blurred vision, double vision, ear pain, eye pain, hearing loss, nose bleeds, sore throat Cardiac:  No dizziness, chest pain or heaviness, chest tightness,edema Resp:   reports dyspnea chronically  Gi: Denies swallowing difficulty, stomach pain, nausea or vomiting, diarrhea, constipation, bowel incontinence Gu:  Denies bladder incontinence, burning urine Ext:   Denies Joint pain, stiffness or swelling Skin: Denies  skin rash, easy bruising or bleeding or hives Endoc:  Denies polyuria, polydipsia , polyphagia or weight change Psych:   Denies depression, insomnia or hallucinations   Other:  All other systems negative   VS: BP 129/83 (BP Location: Right Arm)  Pulse 87   Temp 97.7 F (36.5 C)   Resp 16   Ht 5' 0.98" (1.549 m)   Wt 59.9 kg   SpO2 97%   BMI 24.97 kg/m      PHYSICAL EXAM    GENERAL:NAD, no fevers, chills, no weakness no fatigue HEAD: Normocephalic, atraumatic.  EYES: Pupils equal, round, reactive to light. Extraocular muscles intact. No scleral icterus.  MOUTH: Moist mucosal membrane. Dentition intact. No abscess noted.  EAR, NOSE, THROAT: Clear without exudates. No external lesions.  NECK: Supple. No thyromegaly. No nodules. No JVD.  PULMONARY: decreased breath sounds with mild rhonchi worse at bases bilaterally.  CARDIOVASCULAR: S1 and S2. Regular rate and rhythm. No murmurs, rubs, or gallops. No edema. Pedal pulses 2+ bilaterally.  GASTROINTESTINAL: Soft, nontender, nondistended. No masses. Positive bowel sounds. No hepatosplenomegaly.  MUSCULOSKELETAL: No swelling, clubbing, or edema. Range of motion full in all extremities.  NEUROLOGIC:  Cranial nerves II through XII are intact. No gross focal neurological deficits. Sensation intact. Reflexes intact.  SKIN: No ulceration, lesions, rashes, or cyanosis. Skin warm and dry. Turgor intact.  PSYCHIATRIC: Mood, affect within normal limits. The patient is awake, alert and oriented x 3. Insight, judgment intact.       IMAGING     ASSESSMENT/PLAN   Acute hypoxemic respiratory failure - present on admission  - COVID19 negative -RSV +  - supplemental O2 during my evaluation 2L/[place] - patient denies chest pain d/c planning  -please encourage patient to use incentive spirometer few times each hour while hospitalized.   -can dc IV Zithromax, and IV Rocephin and Flagyl, and can do PO augmentin. Can DC soumedrol and continue prednisone with medrol dose pack -patient tolerating hypertonic saline   Right pleural effusion  - chronic effusion associated with rounded atelectasis    Thank you for allowing me to participate in the care of this patient.   Patient/Family are satisfied with care plan and all questions have been answered.    Provider disclosure: Patient with at least one acute or chronic illness or injury that poses a threat to life or bodily function and is being managed actively during this encounter.  All of the below services have been performed independently by signing provider:  review of prior documentation from internal and or external health records.  Review of previous and current lab results.  Interview and comprehensive assessment during patient visit today. Review of current and previous chest radiographs/CT scans. Discussion of management and test interpretation with health care team and patient/family.   This document was prepared using Dragon voice recognition software and may include unintentional dictation errors.     Ottie Glazier, M.D.  Division of Pulmonary & Critical Care Medicine

## 2022-05-04 NOTE — Progress Notes (Incomplete)
{  Select_TRH_Note:[id number]} 

## 2022-05-05 LAB — CULTURE, BLOOD (ROUTINE X 2)
Culture: NO GROWTH
Culture: NO GROWTH
Special Requests: ADEQUATE

## 2022-06-01 ENCOUNTER — Encounter: Payer: Self-pay | Admitting: Oncology

## 2022-06-01 ENCOUNTER — Inpatient Hospital Stay (HOSPITAL_BASED_OUTPATIENT_CLINIC_OR_DEPARTMENT_OTHER): Payer: Medicare Other | Admitting: Oncology

## 2022-06-01 ENCOUNTER — Inpatient Hospital Stay: Payer: Medicare Other | Attending: Oncology

## 2022-06-01 VITALS — BP 119/65 | HR 94 | Resp 18 | Wt 131.0 lb

## 2022-06-01 DIAGNOSIS — D72820 Lymphocytosis (symptomatic): Secondary | ICD-10-CM | POA: Diagnosis not present

## 2022-06-01 DIAGNOSIS — D649 Anemia, unspecified: Secondary | ICD-10-CM | POA: Diagnosis present

## 2022-06-01 DIAGNOSIS — D509 Iron deficiency anemia, unspecified: Secondary | ICD-10-CM

## 2022-06-01 DIAGNOSIS — M109 Gout, unspecified: Secondary | ICD-10-CM | POA: Insufficient documentation

## 2022-06-01 LAB — BASIC METABOLIC PANEL
Anion gap: 12 (ref 5–15)
BUN: 15 mg/dL (ref 8–23)
CO2: 31 mmol/L (ref 22–32)
Calcium: 8.5 mg/dL — ABNORMAL LOW (ref 8.9–10.3)
Chloride: 94 mmol/L — ABNORMAL LOW (ref 98–111)
Creatinine, Ser: 1.07 mg/dL — ABNORMAL HIGH (ref 0.44–1.00)
GFR, Estimated: 48 mL/min — ABNORMAL LOW (ref >60)
Glucose, Bld: 160 mg/dL — ABNORMAL HIGH (ref 70–99)
Potassium: 3.4 mmol/L — ABNORMAL LOW (ref 3.5–5.1)
Sodium: 137 mmol/L (ref 135–145)

## 2022-06-01 LAB — FERRITIN: Ferritin: 32 ng/mL (ref 11–307)

## 2022-06-01 LAB — CBC WITH DIFFERENTIAL/PLATELET
Abs Immature Granulocytes: 0.01 10*3/uL (ref 0.00–0.07)
Basophils Absolute: 0 10*3/uL (ref 0.0–0.1)
Basophils Relative: 1 %
Eosinophils Absolute: 0.2 10*3/uL (ref 0.0–0.5)
Eosinophils Relative: 3 %
HCT: 38.1 % (ref 36.0–46.0)
Hemoglobin: 12 g/dL (ref 12.0–15.0)
Immature Granulocytes: 0 %
Lymphocytes Relative: 27 %
Lymphs Abs: 1.4 10*3/uL (ref 0.7–4.0)
MCH: 33.1 pg (ref 26.0–34.0)
MCHC: 31.5 g/dL (ref 30.0–36.0)
MCV: 105.2 fL — ABNORMAL HIGH (ref 80.0–100.0)
Monocytes Absolute: 0.7 10*3/uL (ref 0.1–1.0)
Monocytes Relative: 14 %
Neutro Abs: 2.9 10*3/uL (ref 1.7–7.7)
Neutrophils Relative %: 55 %
Platelets: 168 10*3/uL (ref 150–400)
RBC: 3.62 MIL/uL — ABNORMAL LOW (ref 3.87–5.11)
RDW: 13.9 % (ref 11.5–15.5)
WBC: 5.2 10*3/uL (ref 4.0–10.5)
nRBC: 0 % (ref 0.0–0.2)

## 2022-06-01 LAB — IRON AND TIBC
Iron: 75 ug/dL (ref 28–170)
Saturation Ratios: 25 % (ref 10.4–31.8)
TIBC: 302 ug/dL (ref 250–450)
UIBC: 227 ug/dL

## 2022-06-02 ENCOUNTER — Encounter: Payer: Self-pay | Admitting: Oncology

## 2022-06-02 NOTE — Progress Notes (Signed)
Hematology/Oncology Consult note [facility] [place] [facility]  Telephone:([phone] [phone]  Patient Care Team: Adin Hector, MD as PCP - General (Internal Medicine)   Name of the patient: Allison Novak  [id number]  [birth year]   Date of visit: 06/02/22  Diagnosis-  1.  Normocytic anemia likely secondary to iron deficiency and chronic disease 2. Monoclonal B-cell lymphocytosis  Chief complaint/ Reason for visit-routine follow-up of anemia  Heme/Onc history: Patient is a 89 year old female with a past medical history significant for atrial fibrillation, stage IIIa CKD, TIA, hyperlipidemia, moderate pulmonary hypertension, diastolic CHF who was admitted to the hospital in March 2023 with symptoms of shortness of breath.  She was treated for CHF with IV Lasix and also underwent thoracentesis on 07/23/2021 when 400 cc of fluid was drained.  Flow cytometry on the pleural fluid showed partial CD5 positive monoclonal B-cell population but the phenotype was nonspecific.  Differential diagnosis includes atypical CLL, SLL, mantle cell lymphoma and other mature B-cell lymphomas.  Frequent red cells present on smear and a possible peripheral blood contamination cannot be excluded.     Blood work from 07/28/2021 showed white cell count of 7 with a normal differential, H&H of 9.1/29.6 with an MCV of 88 and a platelet count of 226.  CMP showed mildly elevated creatinine of 1.2 B12 and folate were normal.  Ferritin normal at 158.  Reticulocyte count low for the degree of anemia at 2.1%.  Myeloma panel showed 0.1 g IgG kappa monoclonal protein.  Haptoglobin and TSH normal.  Flow cytometry in the peripheral blood showed atypical CD5 positive B-cell population representing less than 1% of the leukocytes.  Interval history-patient is doing well for her age.  She has not had any recurrent pleural effusions or hospitalizations.  ECOG PS- 2 Pain scale- 0  Review of systems- Review of  Systems  Constitutional:  Negative for chills, fever, malaise/fatigue and weight loss.  HENT:  Negative for congestion, ear discharge and nosebleeds.   Eyes:  Negative for blurred vision.  Respiratory:  Negative for cough, hemoptysis, sputum production, shortness of breath and wheezing.   Cardiovascular:  Negative for chest pain, palpitations, orthopnea and claudication.  Gastrointestinal:  Negative for abdominal pain, blood in stool, constipation, diarrhea, heartburn, melena, nausea and vomiting.  Genitourinary:  Negative for dysuria, flank pain, frequency, hematuria and urgency.  Musculoskeletal:  Negative for back pain, joint pain and myalgias.  Skin:  Negative for rash.  Neurological:  Negative for dizziness, tingling, focal weakness, seizures, weakness and headaches.  Endo/Heme/Allergies:  Does not bruise/bleed easily.  Psychiatric/Behavioral:  Negative for depression and suicidal ideas. The patient does not have insomnia.       Allergies  Allergen Reactions   Keflex [Cephalexin] Other (See Comments)    Pt cant remember exact reaction   Macrodantin [Nitrofurantoin Macrocrystal] Other (See Comments)     Past Medical History:  Diagnosis Date   A-fib ([place])    Anemia    Carotid artery stenosis    CHF (congestive heart failure) (HCC)    Chronic kidney disease    Coronary artery disease    Diverticulosis    GERD (gastroesophageal reflux disease)    Gout    Hiatal hernia    Hyperlipidemia    Hypertension    Osteoporosis    Pulmonary embolus ([place])      Past Surgical History:  Procedure Laterality Date   ABDOMINAL HYSTERECTOMY     ANKLE SURGERY Left 2011   KNEE ARTHROSCOPY  Right     Social History   Socioeconomic History   Marital status: Widowed    Spouse name: Not on file   Number of children: Not on file   Years of education: Not on file   Highest education level: Not on file  Occupational History   Not on file  Tobacco Use   Smoking status: Never    Smokeless tobacco: Never  Substance and Sexual Activity   Alcohol use: No    Alcohol/week: 0.0 standard drinks of alcohol   Drug use: No   Sexual activity: Never  Other Topics Concern   Not on file  Social History Narrative   Not on file   Social Determinants of Health   Financial Resource Strain: Not on file  Food Insecurity: No Food Insecurity (05/02/2022)   Hunger Vital Sign    Worried About Running Out of Food in the Last Year: Never true    Ran Out of Food in the Last Year: Never true  Transportation Needs: No Transportation Needs (05/02/2022)   PRAPARE - Hydrologist (Medical): No    Lack of Transportation (Non-Medical): No  Physical Activity: Not on file  Stress: Not on file  Social Connections: Not on file  Intimate Partner Violence: Not At Risk (05/02/2022)   Humiliation, Afraid, Rape, and Kick questionnaire    Fear of Current or Ex-Partner: No    Emotionally Abused: No    Physically Abused: No    Sexually Abused: No    Family History  Problem Relation Age of Onset   CAD Mother    Hypertension Sister    COPD Brother      Current Outpatient Medications:    albuterol (ACCUNEB) 1.25 MG/3ML nebulizer solution, Take 1 ampule by nebulization every 6 (six) hours as needed for wheezing., Disp: , Rfl:    albuterol (VENTOLIN HFA) 108 (90 Base) MCG/ACT inhaler, Inhale 2 puffs into the lungs every 6 (six) hours as needed for wheezing or shortness of breath., Disp: , Rfl:    allopurinol (ZYLOPRIM) 100 MG tablet, Take 100 mg by mouth daily., Disp: , Rfl:    atorvastatin (LIPITOR) 40 MG tablet, Take 40 mg by mouth daily., Disp: , Rfl: 2   calcium carbonate (OSCAL) 1500 (600 Ca) MG TABS tablet, Take 1 tablet by mouth in the morning and at bedtime., Disp: , Rfl:    Cholecalciferol 50 MCG (2000 UT) CAPS, Take 2,000 Units by mouth daily., Disp: , Rfl:    digoxin (LANOXIN) 0.125 MG tablet, Take 0.0625 mg by mouth daily., Disp: , Rfl:    ELIQUIS 2.5 MG  TABS tablet, Take 2.5 mg by mouth 2 (two) times daily., Disp: , Rfl:    esomeprazole (NEXIUM) 40 MG capsule, Take 40 mg by mouth daily., Disp: , Rfl:    levothyroxine (SYNTHROID) 25 MCG tablet, Take 25 mcg by mouth daily., Disp: , Rfl:    metoprolol succinate (TOPROL-XL) 50 MG 24 hr tablet, Take 1 tablet (50 mg total) by mouth daily. Take with or immediately following a meal., Disp: 30 tablet, Rfl: 1   Multiple Vitamin (MULTI-VITAMIN) tablet, Take 1 tablet by mouth daily., Disp: , Rfl:    polyvinyl alcohol (LIQUIFILM TEARS) 1.4 % ophthalmic solution, Place 2 drops into the left eye every 8 (eight) hours as needed for dry eyes., Disp: , Rfl:    Potassium Chloride ER 20 MEQ TBCR, Take 20 mEq by mouth daily., Disp: , Rfl:    torsemide (DEMADEX) 20  MG tablet, Take 2 tablets (40 mg total) by mouth daily., Disp: , Rfl:    Azelastine-Fluticasone 137-50 MCG/ACT SUSP, Place 1 puff into the nose daily. (Patient not taking: Reported on 06/01/2022), Disp: , Rfl:    beclomethasone (QVAR) 40 MCG/ACT inhaler, Inhale into the lungs. (Patient not taking: Reported on 06/01/2022), Disp: , Rfl:    benzonatate (TESSALON) 200 MG capsule, Take by mouth. (Patient not taking: Reported on 06/01/2022), Disp: , Rfl:    predniSONE (DELTASONE) 20 MG tablet, Take 20 mg by mouth daily. (Patient not taking: Reported on 06/01/2022), Disp: , Rfl:  No current facility-administered medications for this visit.  Facility-Administered Medications Ordered in Other Visits:    epoetin alfa-epbx (RETACRIT) injection 40,000 Units, 40,000 Units, Subcutaneous, Q21 days, Sindy Guadeloupe, MD, 40,000 Units at 09/06/21 1425  Physical exam:  Vitals:   06/01/22 1453 06/01/22 1456  BP:  119/65  Pulse:  94  Resp: 18 18  SpO2:  98%  Weight: 131 lb (59.4 kg)    Physical Exam Constitutional:      General: She is not in acute distress. Cardiovascular:     Rate and Rhythm: Normal rate and regular rhythm.     Heart sounds: Normal heart sounds.   Pulmonary:     Effort: Pulmonary effort is normal.     Breath sounds: Normal breath sounds.  Skin:    General: Skin is warm and dry.  Neurological:     Mental Status: She is alert and oriented to person, place, and time.         Latest Ref Rng & Units 06/01/2022    2:39 PM  CMP  Glucose 70 - 99 mg/dL 160   BUN 8 - 23 mg/dL 15   Creatinine 0.44 - 1.00 mg/dL 1.07   Sodium 135 - 145 mmol/L 137   Potassium 3.5 - 5.1 mmol/L 3.4   Chloride 98 - 111 mmol/L 94   CO2 22 - 32 mmol/L 31   Calcium 8.9 - 10.3 mg/dL 8.5       Latest Ref Rng & Units 06/01/2022    2:39 PM  CBC  WBC 4.0 - 10.5 K/uL 5.2   Hemoglobin 12.0 - 15.0 g/dL 12.0   Hematocrit 36.0 - 46.0 % 38.1   Platelets 150 - 400 K/uL 168     No images are attached to the encounter.  No results found.   Assessment and plan- Patient is a 89 y.o. female who is here for follow-up of following issues  Normocytic anemia: Presently her hemoglobin has improved to 12.Overall levels have remained steady between 11.5-12 since July 2023.  After the patient left the clinic her ferritin did come down at 28.  It would be okay to offer her IV iron if she  chooses to come to the clinic versus continue oral iron.  Given the stability of her counts since the last 5 months it would be okay for the patient to follow-up with Dr. Caryl Comes given her age.  She can be referred to [place] in the future if questions or concerns arise.  Although there is monoclonal B-cell lymphocytosis noted on her pleural fluid it is unclear if it was some kind of contamination from peripheral blood.  Her white cell count has remained normal and there is no significant adenopathy or splenomegaly noted on her PET scan.  This does not require continued follow-up unless there is a persistent increase in her white cell count or worsening cytopenias.   Visit Diagnosis 1.  Iron deficiency anemia, unspecified iron deficiency anemia type      Dr. Randa Evens, MD, MPH [facility] at  [facility] [phone] 06/02/2022 2:11 PM

## 2022-06-03 ENCOUNTER — Telehealth: Payer: Self-pay | Admitting: Oncology

## 2022-06-03 NOTE — Telephone Encounter (Signed)
VM left with patient to let her know that per Dr. Janese Banks she needs feraheme x 2.

## 2022-06-07 ENCOUNTER — Encounter: Payer: Self-pay | Admitting: *Deleted

## 2022-08-18 ENCOUNTER — Inpatient Hospital Stay
Admission: EM | Admit: 2022-08-18 | Discharge: 2022-08-23 | DRG: 445 | Disposition: A | Discharge status: Skilled Nursing Facility | Payer: Medicare Other | Attending: Internal Medicine | Admitting: Internal Medicine

## 2022-08-18 ENCOUNTER — Other Ambulatory Visit: Payer: Self-pay

## 2022-08-18 ENCOUNTER — Encounter: Payer: Self-pay | Admitting: Internal Medicine

## 2022-08-18 ENCOUNTER — Other Ambulatory Visit: Payer: Self-pay | Admitting: Internal Medicine

## 2022-08-18 ENCOUNTER — Emergency Department
Admission: RE | Admit: 2022-08-18 | Discharge: 2022-08-18 | Disposition: A | Discharge status: Home / Self Care | Payer: Medicare Other | Source: Ambulatory Visit | Attending: Internal Medicine | Admitting: Internal Medicine

## 2022-08-18 DIAGNOSIS — M810 Age-related osteoporosis without current pathological fracture: Secondary | ICD-10-CM | POA: Diagnosis present

## 2022-08-18 DIAGNOSIS — E039 Hypothyroidism, unspecified: Secondary | ICD-10-CM | POA: Diagnosis present

## 2022-08-18 DIAGNOSIS — I5032 Chronic diastolic (congestive) heart failure: Secondary | ICD-10-CM | POA: Diagnosis present

## 2022-08-18 DIAGNOSIS — E860 Dehydration: Secondary | ICD-10-CM | POA: Diagnosis present

## 2022-08-18 DIAGNOSIS — J90 Pleural effusion, not elsewhere classified: Secondary | ICD-10-CM | POA: Diagnosis present

## 2022-08-18 DIAGNOSIS — Z8249 Family history of ischemic heart disease and other diseases of the circulatory system: Secondary | ICD-10-CM | POA: Diagnosis not present

## 2022-08-18 DIAGNOSIS — I130 Hypertensive heart and chronic kidney disease with heart failure and stage 1 through stage 4 chronic kidney disease, or unspecified chronic kidney disease: Secondary | ICD-10-CM | POA: Diagnosis present

## 2022-08-18 DIAGNOSIS — I4819 Other persistent atrial fibrillation: Secondary | ICD-10-CM | POA: Diagnosis present

## 2022-08-18 DIAGNOSIS — R1031 Right lower quadrant pain: Secondary | ICD-10-CM

## 2022-08-18 DIAGNOSIS — N179 Acute kidney failure, unspecified: Secondary | ICD-10-CM | POA: Diagnosis present

## 2022-08-18 DIAGNOSIS — E871 Hypo-osmolality and hyponatremia: Secondary | ICD-10-CM | POA: Diagnosis present

## 2022-08-18 DIAGNOSIS — Z66 Do not resuscitate: Secondary | ICD-10-CM | POA: Diagnosis present

## 2022-08-18 DIAGNOSIS — Z856 Personal history of leukemia: Secondary | ICD-10-CM

## 2022-08-18 DIAGNOSIS — Z881 Allergy status to other antibiotic agents status: Secondary | ICD-10-CM | POA: Diagnosis not present

## 2022-08-18 DIAGNOSIS — Z79890 Hormone replacement therapy (postmenopausal): Secondary | ICD-10-CM

## 2022-08-18 DIAGNOSIS — Z79899 Other long term (current) drug therapy: Secondary | ICD-10-CM

## 2022-08-18 DIAGNOSIS — E876 Hypokalemia: Secondary | ICD-10-CM | POA: Diagnosis present

## 2022-08-18 DIAGNOSIS — K819 Cholecystitis, unspecified: Principal | ICD-10-CM | POA: Diagnosis present

## 2022-08-18 DIAGNOSIS — Z825 Family history of asthma and other chronic lower respiratory diseases: Secondary | ICD-10-CM | POA: Diagnosis not present

## 2022-08-18 DIAGNOSIS — Z86711 Personal history of pulmonary embolism: Secondary | ICD-10-CM | POA: Diagnosis not present

## 2022-08-18 DIAGNOSIS — Z8673 Personal history of transient ischemic attack (TIA), and cerebral infarction without residual deficits: Secondary | ICD-10-CM

## 2022-08-18 DIAGNOSIS — I482 Chronic atrial fibrillation, unspecified: Secondary | ICD-10-CM

## 2022-08-18 DIAGNOSIS — R000 Tachycardia, unspecified: Secondary | ICD-10-CM | POA: Diagnosis not present

## 2022-08-18 DIAGNOSIS — N1831 Chronic kidney disease, stage 3a: Secondary | ICD-10-CM | POA: Diagnosis present

## 2022-08-18 DIAGNOSIS — I2510 Atherosclerotic heart disease of native coronary artery without angina pectoris: Secondary | ICD-10-CM | POA: Diagnosis present

## 2022-08-18 DIAGNOSIS — K8000 Calculus of gallbladder with acute cholecystitis without obstruction: Secondary | ICD-10-CM | POA: Diagnosis present

## 2022-08-18 DIAGNOSIS — I10 Essential (primary) hypertension: Secondary | ICD-10-CM | POA: Diagnosis present

## 2022-08-18 DIAGNOSIS — E785 Hyperlipidemia, unspecified: Secondary | ICD-10-CM | POA: Diagnosis present

## 2022-08-18 DIAGNOSIS — Z888 Allergy status to other drugs, medicaments and biological substances status: Secondary | ICD-10-CM

## 2022-08-18 DIAGNOSIS — Z7901 Long term (current) use of anticoagulants: Secondary | ICD-10-CM

## 2022-08-18 DIAGNOSIS — R509 Fever, unspecified: Secondary | ICD-10-CM | POA: Diagnosis not present

## 2022-08-18 DIAGNOSIS — M109 Gout, unspecified: Secondary | ICD-10-CM | POA: Diagnosis present

## 2022-08-18 DIAGNOSIS — K810 Acute cholecystitis: Secondary | ICD-10-CM

## 2022-08-18 DIAGNOSIS — K219 Gastro-esophageal reflux disease without esophagitis: Secondary | ICD-10-CM | POA: Diagnosis present

## 2022-08-18 LAB — COMPREHENSIVE METABOLIC PANEL
ALT: 11 U/L (ref 0–44)
AST: 21 U/L (ref 15–41)
Albumin: 3.5 g/dL (ref 3.5–5.0)
Alkaline Phosphatase: 86 U/L (ref 38–126)
Anion gap: 12 (ref 5–15)
BUN: 14 mg/dL (ref 8–23)
CO2: 24 mmol/L (ref 22–32)
Calcium: 8.2 mg/dL — ABNORMAL LOW (ref 8.9–10.3)
Chloride: 93 mmol/L — ABNORMAL LOW (ref 98–111)
Creatinine, Ser: 1.1 mg/dL — ABNORMAL HIGH (ref 0.44–1.00)
GFR, Estimated: 47 mL/min — ABNORMAL LOW (ref >60)
Glucose, Bld: 99 mg/dL (ref 70–99)
Potassium: 3.8 mmol/L (ref 3.5–5.1)
Sodium: 129 mmol/L — ABNORMAL LOW (ref 135–145)
Total Bilirubin: 1 mg/dL (ref 0.3–1.2)
Total Protein: 6.7 g/dL (ref 6.5–8.1)

## 2022-08-18 LAB — URINALYSIS, ROUTINE W REFLEX MICROSCOPIC
Bilirubin Urine: NEGATIVE
Glucose, UA: NEGATIVE mg/dL
Hgb urine dipstick: NEGATIVE
Ketones, ur: NEGATIVE mg/dL
Nitrite: NEGATIVE
Protein, ur: 30 mg/dL — AB
Specific Gravity, Urine: 1.016 (ref 1.005–1.030)
pH: 7 (ref 5.0–8.0)

## 2022-08-18 LAB — CBC
HCT: 37.3 % (ref 36.0–46.0)
Hemoglobin: 12.4 g/dL (ref 12.0–15.0)
MCH: 33 pg (ref 26.0–34.0)
MCHC: 33.2 g/dL (ref 30.0–36.0)
MCV: 99.2 fL (ref 80.0–100.0)
Platelets: 203 10*3/uL (ref 150–400)
RBC: 3.76 MIL/uL — ABNORMAL LOW (ref 3.87–5.11)
RDW: 13.3 % (ref 11.5–15.5)
WBC: 12.8 10*3/uL — ABNORMAL HIGH (ref 4.0–10.5)
nRBC: 0 % (ref 0.0–0.2)

## 2022-08-18 LAB — LIPASE, BLOOD: Lipase: 27 U/L (ref 11–51)

## 2022-08-18 MED ORDER — METRONIDAZOLE 500 MG/100ML IV SOLN
500.0000 mg | Freq: Two times a day (BID) | INTRAVENOUS | Status: DC
  Administered 2022-08-19 – 2022-08-21 (×5): 500 mg via INTRAVENOUS
  Filled 2022-08-18 (×6): qty 100

## 2022-08-18 MED ORDER — ALBUTEROL SULFATE (2.5 MG/3ML) 0.083% IN NEBU: 3.0000 mL | INHALATION_SOLUTION | Freq: Four times a day (QID) | RESPIRATORY_TRACT | Status: DC | PRN

## 2022-08-18 MED ORDER — LACTATED RINGERS IV SOLN: INTRAVENOUS | Status: AC

## 2022-08-18 MED ORDER — LEVOTHYROXINE SODIUM 50 MCG PO TABS
25.0000 ug | ORAL_TABLET | Freq: Every day | ORAL | Status: DC
  Administered 2022-08-20 – 2022-08-23 (×4): 25 ug via ORAL
  Filled 2022-08-18 (×6): qty 1

## 2022-08-18 MED ORDER — FENTANYL CITRATE PF 50 MCG/ML IJ SOSY: 25.0000 ug | PREFILLED_SYRINGE | Freq: Once | INTRAMUSCULAR | Status: DC

## 2022-08-18 MED ORDER — SODIUM CHLORIDE 0.9 % IV SOLN
1.0000 g | INTRAVENOUS | Status: DC
  Administered 2022-08-19 – 2022-08-21 (×3): 1 g via INTRAVENOUS
  Filled 2022-08-18: qty 1
  Filled 2022-08-18 (×2): qty 10

## 2022-08-18 MED ORDER — DIGOXIN 125 MCG PO TABS
0.0625 mg | ORAL_TABLET | Freq: Every day | ORAL | Status: DC
  Administered 2022-08-19 – 2022-08-23 (×5): 0.0625 mg via ORAL
  Filled 2022-08-18 (×6): qty 0.5

## 2022-08-18 MED ORDER — METRONIDAZOLE 500 MG/100ML IV SOLN
500.0000 mg | Freq: Once | INTRAVENOUS | Status: AC
  Administered 2022-08-18: 500 mg via INTRAVENOUS
  Filled 2022-08-18: qty 100

## 2022-08-18 MED ORDER — FENTANYL CITRATE PF 50 MCG/ML IJ SOSY: 50.0000 ug | PREFILLED_SYRINGE | Freq: Once | INTRAMUSCULAR | Status: DC

## 2022-08-18 MED ORDER — ONDANSETRON HCL 4 MG PO TABS: 4.0000 mg | ORAL_TABLET | Freq: Four times a day (QID) | ORAL | Status: DC | PRN

## 2022-08-18 MED ORDER — SODIUM CHLORIDE 0.9 % IV SOLN
1.0000 g | Freq: Once | INTRAVENOUS | Status: AC
  Administered 2022-08-18: 1 g via INTRAVENOUS
  Filled 2022-08-18: qty 10

## 2022-08-18 MED ORDER — SODIUM CHLORIDE 0.9% FLUSH
3.0000 mL | Freq: Two times a day (BID) | INTRAVENOUS | Status: DC
  Administered 2022-08-19 – 2022-08-23 (×6): 3 mL via INTRAVENOUS

## 2022-08-18 MED ORDER — ACETAMINOPHEN 650 MG RE SUPP: 650.0000 mg | Freq: Four times a day (QID) | RECTAL | Status: DC | PRN

## 2022-08-18 MED ORDER — METOPROLOL SUCCINATE ER 50 MG PO TB24
50.0000 mg | ORAL_TABLET | Freq: Every day | ORAL | Status: DC
  Administered 2022-08-19 – 2022-08-23 (×5): 50 mg via ORAL
  Filled 2022-08-18 (×5): qty 1

## 2022-08-18 MED ORDER — ACETAMINOPHEN 325 MG PO TABS
650.0000 mg | ORAL_TABLET | Freq: Four times a day (QID) | ORAL | Status: DC | PRN
  Administered 2022-08-19 – 2022-08-22 (×5): 650 mg via ORAL
  Filled 2022-08-18 (×5): qty 2

## 2022-08-18 MED ORDER — HYDROMORPHONE HCL 1 MG/ML IJ SOLN
0.5000 mg | INTRAMUSCULAR | Status: DC | PRN
  Administered 2022-08-18 – 2022-08-19 (×2): 0.5 mg via INTRAVENOUS
  Filled 2022-08-18 (×2): qty 0.5

## 2022-08-18 MED ORDER — ONDANSETRON HCL 4 MG/2ML IJ SOLN: 4.0000 mg | Freq: Four times a day (QID) | INTRAMUSCULAR | Status: DC | PRN

## 2022-08-18 MED ORDER — FENTANYL CITRATE PF 50 MCG/ML IJ SOSY
25.0000 ug | PREFILLED_SYRINGE | INTRAMUSCULAR | Status: DC | PRN
  Administered 2022-08-18: 25 ug via INTRAVENOUS
  Filled 2022-08-18: qty 1

## 2022-08-18 NOTE — H&P (Signed)
History and Physical    Patient: Allison Novak ZOX:[id number] DOB: 1926-04-08 DOA: 08/18/2022 DOS: the patient was seen and examined on 08/18/2022 PCP: Allison Novak, Bert J III, MD  Patient coming from: Home  Chief Complaint:  Chief Complaint  Patient presents with   Abdominal Pain   HPI: [place] is a 89 y.o. female with medical history significant of HFpEF, atrial fibrillation on Eliquis, previous PE, hypertension, hyperlipidemia, CAD, osteoporosis, hiatal hernia, CKD stage IIIa, CLL, who presents to the ED due to abdominal pain.  Allison Novak states that for the last 1 week, she has been experiencing right upper and lower quadrant abdominal pain that has been waxing and waning with mild to moderate severity up until this morning, when pain came back but was significantly severe.  She denies any other symptoms including fever, chills, nausea, vomiting, diarrhea.  She has not noticed that anything particular exacerbates or alleviates the abdominal pain.  She denies any chest pain, shortness of breath, palpitations or lower extremity edema.  Due to the symptoms, she went to her PCP today who ordered a CT of the abdomen.  Due to the results, she was instructed to go to the ED.  Last dose of Eliquis was last night.  ED course: On arrival to the ED, patient was normotensive at 118/104 with heart rate of 85.  She was saturating at 98% on room air.  She was afebrile at 98.  Initial workup notable for WBC of 12.8, hemoglobin 12.4, platelets 203, sodium 129, bicarb 24, creatinine 1.10, GFR 47, AST 21, ALT 11, lipase 27. Prior to arrival, CT abdomen was obtained that demonstrated cholelithiasis with severe gallbladder distention and wall thickening concerning for acute cholecystitis, moderate right pleural effusion, large sliding hiatal hernia.  General surgery was consulted.  Given high risk of surgery, recommending medical admission.  [facility] contacted for admission  Review of Systems: As mentioned in the  history of present illness. All other systems reviewed and are negative.  Past Medical History:  Diagnosis Date   A-fib (HCC)    Anemia    Carotid artery stenosis    CHF (congestive heart failure) (HCC)    Chronic kidney disease    Coronary artery disease    Diverticulosis    GERD (gastroesophageal reflux disease)    Gout    Hiatal hernia    Hyperlipidemia    Hypertension    Osteoporosis    Pulmonary embolus (HCC)    Past Surgical History:  Procedure Laterality Date   ABDOMINAL HYSTERECTOMY     ANKLE SURGERY Left 2011   KNEE ARTHROSCOPY Right    Social History:  reports that she has never smoked. She has never used smokeless tobacco. She reports that she does not drink alcohol and does not use drugs.  Allergies  Allergen Reactions   Keflex [Cephalexin] Other (See Comments)    Pt cant remember exact reaction   Macrodantin [Nitrofurantoin Macrocrystal] Other (See Comments)    Family History  Problem Relation Age of Onset   CAD Mother    Hypertension Sister    COPD Brother     Prior to Admission medications   Medication Sig Start Date End Date Taking? Authorizing Provider  albuterol (ACCUNEB) 1.25 MG/3ML nebulizer solution Take 1 ampule by nebulization every 6 (six) hours as needed for wheezing.    [provider]  albuterol (VENTOLIN HFA) 108 (90 Base) MCG/ACT inhaler Inhale 2 puffs into the lungs every 6 (six) hours as needed for wheezing or  shortness of breath. 04/26/22   [provider]  allopurinol (ZYLOPRIM) 100 MG tablet Take 100 mg by mouth daily.    [provider]  atorvastatin (LIPITOR) 40 MG tablet Take 40 mg by mouth daily. 11/18/16   [provider]  Azelastine-Fluticasone 137-50 MCG/ACT SUSP Place 1 puff into the nose daily. Patient not taking: Reported on 06/01/2022    [provider]  beclomethasone (QVAR) 40 MCG/ACT inhaler Inhale into the lungs. Patient not taking: Reported on 06/01/2022 05/16/22   [provider]  benzonatate (TESSALON) 200 MG capsule Take by mouth. Patient not taking: Reported on 06/01/2022 05/12/22   [provider]  calcium carbonate (OSCAL) 1500 (600 Ca) MG TABS tablet Take 1 tablet by mouth in the morning and at bedtime.    [provider]  Cholecalciferol 50 MCG (2000 UT) CAPS Take 2,000 Units by mouth daily.    [provider]  digoxin (LANOXIN) 0.125 MG tablet Take 0.0625 mg by mouth daily. 08/27/21   [provider]  ELIQUIS 2.5 MG TABS tablet Take 2.5 mg by mouth 2 (two) times daily. 02/13/20   [provider]  esomeprazole (NEXIUM) 40 MG capsule Take 40 mg by mouth daily.    [provider]  levothyroxine (SYNTHROID) 25 MCG tablet Take 25 mcg by mouth daily. 07/14/21   [provider]  metoprolol succinate (TOPROL-XL) 50 MG 24 hr tablet Take 1 tablet (50 mg total) by mouth daily. Take with or immediately following a meal. 06/11/21   Novak, Allison Curtis, MD  Multiple Vitamin (MULTI-VITAMIN) tablet Take 1 tablet by mouth daily.    [provider]  polyvinyl alcohol (LIQUIFILM TEARS) 1.4 % ophthalmic solution Place 2 drops into the left eye every 8 (eight) hours as needed for dry eyes. 07/26/21   [provider]  Potassium Chloride ER 20 MEQ TBCR Take 20 mEq by mouth daily. 11/29/21   [provider]  predniSONE (DELTASONE) 20 MG tablet Take 20 mg by mouth daily. Patient not taking: Reported on 06/01/2022 05/12/22   [provider]  torsemide (DEMADEX) 20 MG tablet Take 2 tablets (40 mg total) by mouth daily. 05/04/22   Allison Shadow, MD    Physical Exam: Vitals:   08/18/22 1355 08/18/22 1500 08/18/22 1524  BP: (!) 118/104 130/68   Pulse: 87 87   Resp: 18 19   Temp: 98 F (36.7 C)    SpO2: 98% 91%   Weight:   61.2 kg  Height:   5\' 1"  (1.549 m)   Physical Exam Vitals and nursing note reviewed.  Constitutional:      General: She is not in acute distress.    Appearance:  She is normal weight. She is not toxic-appearing.  HENT:     Head: Normocephalic and atraumatic.     Mouth/Throat:     Mouth: Mucous membranes are moist.     Pharynx: Oropharynx is clear.  Eyes:     Extraocular Movements: Extraocular movements intact.     Pupils: Pupils are equal, round, and reactive to light.  Cardiovascular:     Rate and Rhythm: Normal rate. Rhythm irregular.  Pulmonary:     Effort: Pulmonary effort is normal. No respiratory distress.     Breath sounds: Examination of the right-lower field reveals decreased breath sounds. Decreased breath sounds present. No wheezing or rales.  Abdominal:     General: Bowel sounds are normal. There is no distension.     Palpations: Abdomen is soft.  Tenderness: There is abdominal tenderness in the right upper quadrant and right lower quadrant. There is guarding.  Musculoskeletal:     Cervical back: Neck supple.     Right lower leg: No edema.     Left lower leg: No edema.  Skin:    General: Skin is warm and dry.  Neurological:     General: No focal deficit present.     Mental Status: She is alert and oriented to person, place, and time. Mental status is at baseline.  Psychiatric:        Mood and Affect: Mood normal.        Behavior: Behavior normal.    Data Reviewed: CBC with WBC of 12.8, hemoglobin 12.4, MCV 99, platelets 203 CMP with sodium 129, potassium 3.8, chloride 93, bicarb 24, glucose 99, creatinine 1.10, BUN 14, calcium 8.2, AST 21, ALT 11 and GFR 47. Lipase within normal limits at 27  EKG personally.  Atrial fibrillation with rate of 87.  No ST or T wave changes concerning for acute ischemia.  CT ABDOMEN PELVIS WO CONTRAST  Result Date: 08/18/2022 CLINICAL DATA:  Right lower quadrant abdominal pain. EXAM: CT ABDOMEN AND PELVIS WITHOUT CONTRAST TECHNIQUE: Multidetector CT imaging of the abdomen and pelvis was performed following the standard protocol without IV contrast. RADIATION DOSE REDUCTION: This exam was  performed according to the departmental dose-optimization program which includes automated exposure control, adjustment of the mA and/or kV according to patient size and/or use of iterative reconstruction technique. COMPARISON:  October 12, 2017. FINDINGS: Lower chest: Large hiatal hernia is noted. Moderate right pleural effusion is noted with adjacent subsegmental atelectasis of the right lower lobe. Hepatobiliary: Cholelithiasis is noted with severe gallbladder distention and wall thickening concerning for acute cholecystitis. No biliary dilatation is noted. Liver is unremarkable. Pancreas: Unremarkable. No pancreatic ductal dilatation or surrounding inflammatory changes. Spleen: Normal in size without focal abnormality. Adrenals/Urinary Tract: Adrenal glands are unremarkable. Kidneys are normal, without renal calculi, focal lesion, or hydronephrosis. Bladder is unremarkable. Stomach/Bowel: Stomach is within normal limits. Appendix appears normal. No evidence of bowel wall thickening, distention, or inflammatory changes. Sigmoid diverticulosis is noted without inflammation. Vascular/Lymphatic: Aortic atherosclerosis. No enlarged abdominal or pelvic lymph nodes. Reproductive: Status post hysterectomy. No adnexal masses. Other: Small amount of free fluid is noted which most likely is physiologic. No hernia is noted. Musculoskeletal: No acute or significant osseous findings. IMPRESSION: Cholelithiasis with severe gallbladder distension and wall thickening concerning for acute cholecystitis. These results will be called to the ordering clinician or representative by the Radiologist Assistant, and communication documented in the PACS or zVision Dashboard. Large sliding-type hiatal hernia is noted. Moderate right pleural effusion is noted with adjacent subsegmental atelectasis. Sigmoid diverticulosis without inflammation. Aortic Atherosclerosis (ICD10-I70.0). Electronically Signed   By: Lupita Raider M.D.   On: 08/18/2022  12:53    Results are pending, will review when available.  Assessment and Plan:  * Acute cholecystitis Patient is presenting with 1 week history of waxing and waning right upper quadrant and lower quadrant abdominal pain that has become suddenly worse today.  CT imaging with cholelithiasis with evidence of acute cholecystitis.  General surgery has been consulted with potential plans to consult IR.  Patient is high risk with surgery, will start with medical management.  - General surgery consulted; appreciate their recommendations - Will discuss IR consultation with general surgery - Continue ceftriaxone and metronidazole - N.p.o. for now - Dilaudid as needed for pain control  Chronic diastolic CHF (  congestive heart failure) Patient has a history of HFpEF currently being managed with torsemide.  On examination, she is euvolemic.  Will hold home torsemide given n.p.o. status with poor p.o. intake over the last several days.  Will monitor closely given risk for pulmonary edema.  - Daily weights - Strict in and out - Continuous pulse oximetry - Restart torsemide when able  Pleural effusion on right Patient has a history of chronic right-sided pleural effusion in the setting of CLL.  Pleural effusion appears stable at this time.  No indication for thoracentesis  Hypothyroidism - Continue home levothyroxine  Stage 3a chronic kidney disease (CKD) Renal function currently at baseline.  Will monitor while admitted.  - Daily BMP  Atrial fibrillation, chronic - Holding home Eliquis in the setting of potential procedure - Continue home metoprolol and digoxin  Essential hypertension - Continue home metoprolol  Advance Care Planning:   Code Status: DNR.  Mrs. Parham states that she is certain she would not want cardiac resuscitation in the event of cardiac arrest.  She is uncertain what she would want in the event of pulmonary only arrest.  I encouraged her to continue discussing this with  her family and until she is able to definitively decide, intubation will remain as an option.  Consults: General surgery  Family Communication: Patient's son updated at bedside  Severity of Illness: The appropriate patient status for this patient is INPATIENT. Inpatient status is judged to be reasonable and necessary in order to provide the required intensity of service to ensure the patient's safety. The patient's presenting symptoms, physical exam findings, and initial radiographic and laboratory data in the context of their chronic comorbidities is felt to place them at high risk for further clinical deterioration. Furthermore, it is not anticipated that the patient will be medically stable for discharge from the hospital within 2 midnights of admission.   * I certify that at the point of admission it is my clinical judgment that the patient will require inpatient hospital care spanning beyond 2 midnights from the point of admission due to high intensity of service, high risk for further deterioration and high frequency of surveillance required.*  Author: Verdene Lennert, MD 08/18/2022 4:35 PM  For on call review [email or web address].

## 2022-08-18 NOTE — ED Triage Notes (Signed)
Pt comes from CT with + gallbladder. Pt states right sided pain since last Saturday. Pt came to hospital for OPT scan. Pt is on thinners and states she didn't take it today. Per Radiologist pt will need to be admitted for surgery.   Pt states no N/V

## 2022-08-18 NOTE — ED Notes (Addendum)
Called to [place] on the floor and she had not had a change to review chart but will do now and notify of any questions.

## 2022-08-18 NOTE — Consult Note (Signed)
[facility] SURGICAL CONSULTATION NOTE (initial) - cpt: 99254   HISTORY OF PRESENT ILLNESS (HPI):  89 y.o. female presented to [facility] ED today for evaluation of right sided abdominal pain, of about 6 days. Patient reports no n/v, able to tolerated breakfast this AM of cantaloupe and bacon.   Mild prior h/o post-prandial RUQ pain and brief nausea.  Denies h/o hepatitis, jaundice, or melena.  CT today confirms edematous and distended GB with gallstones.  No CBD dilation noted.  LFTs all normal.   WBC elevated 12+.   Surgery is consulted by ED physician Dr. Modesto Charon in this context for evaluation and management of acute cholecystitis.  PAST MEDICAL HISTORY (PMH):  Past Medical History:  Diagnosis Date   A-fib (HCC)    Anemia    Carotid artery stenosis    CHF (congestive heart failure) (HCC)    Chronic kidney disease    Coronary artery disease    Diverticulosis    GERD (gastroesophageal reflux disease)    Gout    Hiatal hernia    Hyperlipidemia    Hypertension    Osteoporosis    Pulmonary embolus (HCC)      PAST SURGICAL HISTORY (PSH):  Past Surgical History:  Procedure Laterality Date   ABDOMINAL HYSTERECTOMY     ANKLE SURGERY Left 2011   KNEE ARTHROSCOPY Right      MEDICATIONS:  Prior to Admission medications   Medication Sig Start Date End Date Taking? Authorizing Provider  albuterol (ACCUNEB) 1.25 MG/3ML nebulizer solution Take 1 ampule by nebulization every 6 (six) hours as needed for wheezing.   Yes [provider]  albuterol (VENTOLIN HFA) 108 (90 Base) MCG/ACT inhaler Inhale 2 puffs into the lungs every 6 (six) hours as needed for wheezing or shortness of breath. 04/26/22  Yes [provider]  allopurinol (ZYLOPRIM) 100 MG tablet Take 100 mg by mouth daily.   Yes [provider]  atorvastatin (LIPITOR) 40 MG tablet Take 40 mg by mouth daily. 11/18/16  Yes [provider]  calcium carbonate (OSCAL) 1500 (600 Ca) MG TABS  tablet Take 1 tablet by mouth in the morning and at bedtime.   Yes [provider]  Cholecalciferol 50 MCG (2000 UT) CAPS Take 2,000 Units by mouth daily.   Yes [provider]  digoxin (LANOXIN) 0.125 MG tablet Take 0.0625 mg by mouth daily. 08/27/21  Yes [provider]  ELIQUIS 2.5 MG TABS tablet Take 2.5 mg by mouth 2 (two) times daily. 02/13/20  Yes [provider]  esomeprazole (NEXIUM) 40 MG capsule Take 40 mg by mouth daily.   Yes [provider]  levothyroxine (SYNTHROID) 25 MCG tablet Take 25 mcg by mouth daily. 07/14/21  Yes [provider]  Multiple Vitamin (MULTI-VITAMIN) tablet Take 1 tablet by mouth daily.   Yes [provider]  polyvinyl alcohol (LIQUIFILM TEARS) 1.4 % ophthalmic solution Place 2 drops into the left eye every 8 (eight) hours as needed for dry eyes. 07/26/21  Yes [provider]  potassium chloride (KLOR-CON) 10 MEQ tablet Take 10 mEq by mouth daily as needed. 07/26/22  Yes [provider]  torsemide (DEMADEX) 20 MG tablet Take 2 tablets (40 mg total) by mouth daily. 05/04/22  Yes Lurene Shadow, MD  Azelastine-Fluticasone 137-50 MCG/ACT SUSP Place 1 puff into the nose daily. Patient not taking: Reported on 06/01/2022    [provider]  beclomethasone (QVAR) 40 MCG/ACT inhaler Inhale into the lungs. Patient not taking: Reported on 06/01/2022  05/16/22   [provider]  benzonatate (TESSALON) 200 MG capsule Take by mouth. Patient not taking: Reported on 06/01/2022 05/12/22   [provider]  metoprolol succinate (TOPROL-XL) 50 MG 24 hr tablet Take 1 tablet (50 mg total) by mouth daily. Take with or immediately following a meal. 06/11/21   Wouk, Wilfred CurtisNoah Bedford, MD  predniSONE (DELTASONE) 20 MG tablet Take 20 mg by mouth daily. Patient not taking: Reported on 06/01/2022 05/12/22   [provider]     ALLERGIES:  Allergies  Allergen Reactions   Keflex [Cephalexin]  Other (See Comments)    Pt cant remember exact reaction   Macrodantin [Nitrofurantoin Macrocrystal] Other (See Comments)     SOCIAL HISTORY:  Social History   Socioeconomic History   Marital status: Widowed    Spouse name: Not on file   Number of children: Not on file   Years of education: Not on file   Highest education level: Not on file  Occupational History   Not on file  Tobacco Use   Smoking status: Never   Smokeless tobacco: Never  Substance and Sexual Activity   Alcohol use: No    Alcohol/week: 0.0 standard drinks of alcohol   Drug use: No   Sexual activity: Never  Other Topics Concern   Not on file  Social History Narrative   Not on file   Social Determinants of Health   Financial Resource Strain: Not on file  Food Insecurity: No Food Insecurity (05/02/2022)   Hunger Vital Sign    Worried About Running Out of Food in the Last Year: Never true    Ran Out of Food in the Last Year: Never true  Transportation Needs: No Transportation Needs (05/02/2022)   PRAPARE - Administrator, Civil ServiceTransportation    Lack of Transportation (Medical): No    Lack of Transportation (Non-Medical): No  Physical Activity: Not on file  Stress: Not on file  Social Connections: Not on file  Intimate Partner Violence: Not At Risk (05/02/2022)   Humiliation, Afraid, Rape, and Kick questionnaire    Fear of Current or Ex-Partner: No    Emotionally Abused: No    Physically Abused: No    Sexually Abused: No     FAMILY HISTORY:  Family History  Problem Relation Age of Onset   CAD Mother    Hypertension Sister    COPD Brother       REVIEW OF SYSTEMS:  Review of Systems  All other systems reviewed and are negative.   VITAL SIGNS:  Temp:  [98 F (36.7 C)] 98 F (36.7 C) (04/11 1355) Pulse Rate:  [87] 87 (04/11 1500) Resp:  [18-19] 19 (04/11 1500) BP: (118-130)/(68-104) 130/68 (04/11 1500) SpO2:  [91 %-98 %] 91 % (04/11 1500) Weight:  [61.2 kg] 61.2 kg (04/11 1524)     Height: 5\' 1"  (154.9 cm)  Weight: 61.2 kg BMI (Calculated): 25.52   INTAKE/OUTPUT:  No intake/output data recorded.  PHYSICAL EXAM:  Physical Exam Blood pressure 130/68, pulse 87, temperature 98 F (36.7 C), resp. rate 19, height 5\' 1"  (1.549 m), weight 61.2 kg, SpO2 91 %. Last Weight  Most recent update: 08/18/2022  3:24 PM    Weight  61.2 kg (135 lb)             CONSTITUTIONAL: Well developed, and nourished, appropriately responsive and aware without distress.   EYES: Sclera non-icteric.   EARS, NOSE, MOUTH AND THROAT:  The oropharynx is clear. Oral mucosa is pink and moist.  Hearing is intact to voice.  NECK: Trachea is midline, and there is no jugular venous distension.  LYMPH NODES:  Lymph nodes in the neck are not enlarged. RESPIRATORY:  Lungs are clear, and breath sounds are diminished in the right base.   Normal respiratory effort without pathologic use of accessory muscles. CARDIOVASCULAR: Heart is regular in rate and irregular in rhythm.   Adequately perfused.  GI: The abdomen is tender and firm on the right.  O/w non-distended.  MUSCULOSKELETAL:  Warm without edema.  SKIN: Skin turgor is normal. No pathologic skin lesions appreciated.  NEUROLOGIC:  Motor and sensation appear grossly normal.  Cranial nerves are grossly without defect. PSYCH:  Alert and oriented to person, place and time. Affect is appropriate for situation.  Data Reviewed I have personally reviewed what is currently available of the patient's imaging, recent labs and medical records.    Labs:     Latest Ref Rng & Units 08/18/2022    2:40 PM 06/01/2022    2:39 PM 05/01/2022    4:59 AM  CBC  WBC 4.0 - 10.5 K/uL 12.8  5.2  5.8   Hemoglobin 12.0 - 15.0 g/dL 24.8  18.5  90.9   Hematocrit 36.0 - 46.0 % 37.3  38.1  35.8   Platelets 150 - 400 K/uL 203  168  144       Latest Ref Rng & Units 08/18/2022    2:40 PM 06/01/2022    2:39 PM 05/01/2022    4:59 AM  CMP  Glucose 70 - 99 mg/dL 99  311  216   BUN 8 - 23 mg/dL 14  15  19     Creatinine 0.44 - 1.00 mg/dL 2.44  6.95  0.72   Sodium 135 - 145 mmol/L 129  137  138   Potassium 3.5 - 5.1 mmol/L 3.8  3.4  4.3   Chloride 98 - 111 mmol/L 93  94  99   CO2 22 - 32 mmol/L 24  31  33   Calcium 8.9 - 10.3 mg/dL 8.2  8.5  8.4   Total Protein 6.5 - 8.1 g/dL 6.7     Total Bilirubin 0.3 - 1.2 mg/dL 1.0     Alkaline Phos 38 - 126 U/L 86     AST 15 - 41 U/L 21     ALT 0 - 44 U/L 11        Imaging studies:   Last 24 hrs: CT ABDOMEN PELVIS WO CONTRAST  Result Date: 08/18/2022 CLINICAL DATA:  Right lower quadrant abdominal pain. EXAM: CT ABDOMEN AND PELVIS WITHOUT CONTRAST TECHNIQUE: Multidetector CT imaging of the abdomen and pelvis was performed following the standard protocol without IV contrast. RADIATION DOSE REDUCTION: This exam was performed according to the departmental dose-optimization program which includes automated exposure control, adjustment of the mA and/or kV according to patient size and/or use of iterative reconstruction technique. COMPARISON:  October 12, 2017. FINDINGS: Lower chest: Large hiatal hernia is noted. Moderate right pleural effusion is noted with adjacent subsegmental atelectasis of the right lower lobe. Hepatobiliary: Cholelithiasis is noted with severe gallbladder distention and wall thickening concerning for acute cholecystitis. No biliary dilatation is noted. Liver is unremarkable. Pancreas: Unremarkable. No pancreatic ductal dilatation or surrounding inflammatory changes. Spleen: Normal in size without focal abnormality. Adrenals/Urinary Tract: Adrenal glands are unremarkable. Kidneys are normal, without renal calculi, focal lesion, or hydronephrosis. Bladder is unremarkable. Stomach/Bowel: Stomach is within normal limits. Appendix appears normal. No evidence of bowel wall thickening,  distention, or inflammatory changes. Sigmoid diverticulosis is noted without inflammation. Vascular/Lymphatic: Aortic atherosclerosis. No enlarged abdominal or pelvic lymph  nodes. Reproductive: Status post hysterectomy. No adnexal masses. Other: Small amount of free fluid is noted which most likely is physiologic. No hernia is noted. Musculoskeletal: No acute or significant osseous findings. IMPRESSION: Cholelithiasis with severe gallbladder distension and wall thickening concerning for acute cholecystitis. These results will be called to the ordering clinician or representative by the Radiologist Assistant, and communication documented in the PACS or zVision Dashboard. Large sliding-type hiatal hernia is noted. Moderate right pleural effusion is noted with adjacent subsegmental atelectasis. Sigmoid diverticulosis without inflammation. Aortic Atherosclerosis (ICD10-I70.0). Electronically Signed   By: Lupita Raider M.D.   On: 08/18/2022 12:53     Assessment/Plan:  89 y.o. female with likely cystic duct obstruction and Hydrops vs acute calculus cholecystitis, complicated by pertinent comorbidities including:  Patient Active Problem List   Diagnosis Date Noted   Acute cholecystitis 08/18/2022   Gout 06/01/2022   Sepsis due to pneumonia 04/30/2022   Acute bronchitis due to respiratory syncytial virus (RSV) 04/30/2022   Aspiration pneumonia 04/30/2022   CLL (chronic lymphocytic leukemia) 01/18/2022   Iron deficiency anemia 07/21/2021   Moderate pulmonary hypertension 07/20/2021   Sepsis 07/19/2021   Thrombocytopenia 07/19/2021   Acute urinary retention 07/18/2021   History of pulmonary embolism 07/18/2021   Hypothermia 07/18/2021   Pleural effusion on right 07/18/2021   Chronic diastolic CHF (congestive heart failure) 07/17/2021   HLD (hyperlipidemia) 07/17/2021   Pulmonary embolism 07/17/2021   Stage 3a chronic kidney disease (CKD) 07/17/2021   Hypothyroidism 07/17/2021   Acute respiratory failure with hypoxia 07/17/2021   Acquired thrombophilia 06/22/2021   Anemia of chronic disease 06/07/2021   Permanent atrial fibrillation 06/07/2021   Hyponatremia  06/07/2021   Acute on chronic combined systolic and diastolic CHF (congestive heart failure) 06/07/2021   Atrial fibrillation, chronic 08/04/2020   Acute exacerbation of CHF (congestive heart failure) 08/04/2020   Shortness of breath 08/03/2020   Hypokalemia    Palpitations    Hyperlipidemia 02/16/2020   Atrial fibrillation with RVR 02/16/2020   Hypoxia 02/16/2020   Acute CHF (congestive heart failure) 02/16/2020   Chronic anticoagulation 02/16/2020   Senile purpura 11/21/2019   Nodule of upper lobe of left lung 10/30/2017   History of TIA (transient ischemic attack) 02/23/2017   Carotid artery disease 02/23/2017   TIA (transient ischemic attack) 02/18/2017   Hiatal hernia with GERD without esophagitis 02/18/2017   History of nonmelanoma skin cancer 08/10/2015   Gallstones 06/30/2015   Chest pain 06/23/2015   Essential hypertension 06/16/2015   Aortic atherosclerosis 06/16/2015   Diverticulosis of large intestine without hemorrhage 06/16/2015   Age-related osteoporosis without current pathological fracture 10/22/2013   Chronic ankle pain 06/28/2011    - Seems prudent considering the co-morbidities to proceed with less invasive means to bring this pt relief.  I suspect Abx coverage/treatment alone will be futile.     - Anticipate IR evaluation and likely percutaneous drainage/cholecystostomy in AM.    - Will follow to assess adequacy of relief.     - DVT/PPI prophylaxis  I discussed alternatives of proceeding with surgery, and the risks may be acceptable should less invasive means are found unhelpful.     All of the above findings and recommendations were discussed with the patient and daughter, and all of patient's and present family's questions were answered to their expressed satisfaction.  Thank you for the opportunity to participate in  this patient's care.   -- Campbell Lerner, M.D., FACS 08/18/2022, 5:26 PM

## 2022-08-18 NOTE — ED Notes (Signed)
Pt states she last thing she ate was cantaloupe at 0800 this morning.

## 2022-08-18 NOTE — Assessment & Plan Note (Signed)
Patient is presenting with 1 week history of waxing and waning right upper quadrant and lower quadrant abdominal pain that has become suddenly worse today.  CT imaging with cholelithiasis with evidence of acute cholecystitis.  General surgery has been consulted with potential plans to consult IR.  Patient is high risk with surgery, will start with medical management.  - General surgery consulted; appreciate their recommendations - Will discuss IR consultation with general surgery - Continue ceftriaxone and metronidazole - N.p.o. for now - Dilaudid as needed for pain control

## 2022-08-18 NOTE — Assessment & Plan Note (Signed)
Patient has a history of chronic right-sided pleural effusion in the setting of CLL.  Pleural effusion appears stable at this time.  No indication for thoracentesis

## 2022-08-18 NOTE — Assessment & Plan Note (Addendum)
-   Holding home Eliquis in the setting of potential procedure - Continue home metoprolol and digoxin

## 2022-08-18 NOTE — Assessment & Plan Note (Signed)
Continue home levothyroxine 

## 2022-08-18 NOTE — Assessment & Plan Note (Signed)
-   Continue home metoprolol 

## 2022-08-18 NOTE — Assessment & Plan Note (Signed)
Renal function currently at baseline.  Will monitor while admitted.  - Daily BMP

## 2022-08-18 NOTE — ED Provider Notes (Signed)
[facility] Provider Note    Event Date/Time   First MD Initiated Contact with Patient 08/18/22 1347     (approximate)   History   Abdominal Pain   HPI  Allison Novak is a 89 y.o. female   Past medical history of Chf, cad, htn, hld, pe, AFibrillation on Eliquis last taken last night, here with right-sided abdominal pain for the last week - got outpatient CT that showed cholecystitis here after CT read resulted.    She states that it has been progressively worsening and is now constant right upper quadrant pain.  No nausea or vomiting.  No GU complaints.  Bowel movements have been normal no GI bleeding.  Independent Historian contributed to assessment above: Son at bedside  External Medical Documents Reviewed: CT scan report from earlier today documenting cholecystitis      Physical Exam   Triage Vital Signs: ED Triage Vitals [08/18/22 1354]  Enc Vitals Group     BP      Pulse      Resp      Temp      Temp src      SpO2      Weight      Height      Head Circumference      Peak Flow      Pain Score 6     Pain Loc      Pain Edu?      Excl. in GC?     Most recent vital signs: Vitals:   08/18/22 1355 08/18/22 1500  BP: (!) 118/104 130/68  Pulse: 87 87  Resp: 18 19  Temp: 98 F (36.7 C)   SpO2: 98% 91%    General: Awake, no distress.  CV:  Good peripheral perfusion.  Resp:  Normal effort.  Abd:  No distention.  Other:  Right upper quadrant tenderness palpation with voluntary guarding -otherwise awake alert comfortable appearing no fever nontoxic appearance.   ED Results / Procedures / Treatments   Labs (all labs ordered are listed, but only abnormal results are displayed) Labs Reviewed  COMPREHENSIVE METABOLIC PANEL - Abnormal; Notable for the following components:      Result Value   Sodium 129 (*)    Chloride 93 (*)    Creatinine, Ser 1.10 (*)    Calcium 8.2 (*)    GFR, Estimated 47 (*)    All other components  within normal limits  CBC - Abnormal; Notable for the following components:   WBC 12.8 (*)    RBC 3.76 (*)    All other components within normal limits  LIPASE, BLOOD  URINALYSIS, ROUTINE W REFLEX MICROSCOPIC     I ordered and reviewed the above labs they are notable for she has a white blood cell count of 12.8  EKG  ED ECG REPORT I, Pilar Jarvis, the attending physician, personally viewed and interpreted this ECG.   Date: 08/18/2022  EKG Time: 1423  Rate: 87  Rhythm: AF  Axis: nl  Intervals:none  ST&T Change: No acute ischemic changes      PROCEDURES:  Critical Care performed: No  Procedures   MEDICATIONS ORDERED IN ED: Medications  metroNIDAZOLE (FLAGYL) IVPB 500 mg (500 mg Intravenous New Bag/Given 08/18/22 1527)  fentaNYL (SUBLIMAZE) injection 25 mcg (25 mcg Intravenous Given 08/18/22 1454)  sodium chloride flush (NS) 0.9 % injection 3 mL (has no administration in time range)  acetaminophen (TYLENOL) tablet 650 mg (has no administration in time  range)    Or  acetaminophen (TYLENOL) suppository 650 mg (has no administration in time range)  ondansetron (ZOFRAN) tablet 4 mg (has no administration in time range)    Or  ondansetron (ZOFRAN) injection 4 mg (has no administration in time range)  metroNIDAZOLE (FLAGYL) IVPB 500 mg (has no administration in time range)  cefTRIAXone (ROCEPHIN) 1 g in sodium chloride 0.9 % 100 mL IVPB (has no administration in time range)  cefTRIAXone (ROCEPHIN) 1 g in sodium chloride 0.9 % 100 mL IVPB (0 g Intravenous Stopped 08/18/22 1527)    External physician / consultants:  I spoke with Dr. Claudine Mouton of general surgery regarding care plan for this patient.   IMPRESSION / MDM / ASSESSMENT AND PLAN / ED COURSE  I reviewed the triage vital signs and the nursing notes.                                Patient's presentation is most consistent with acute presentation with potential threat to life or bodily function.  Differential  diagnosis includes, but is not limited to, cholecystitis, intra-abdominal infection, sepsis   The patient is on the cardiac monitor to evaluate for evidence of arrhythmia and/or significant heart rate changes.  MDM: Cholecystitis with right upper quadrant pain progressively worsening over the 1 week and CT scan suggestive of this finding.  White blood cell count elevated otherwise patient appears comfortable nontoxic and does have right upper quadrant tenderness.  IV antibiotics started in the emergency department and consulted with Dr. Claudine Mouton of general surgery who recommends IV antibiotics at this point and admit to hospitalist service.  He will evaluate the patient and consider IR, surgical, or medical management.         FINAL CLINICAL IMPRESSION(S) / ED DIAGNOSES   Final diagnoses:  Cholecystitis     Rx / DC Orders   ED Discharge Orders     None        Note:  This document was prepared using Dragon voice recognition software and may include unintentional dictation errors.    Pilar Jarvis, MD 08/18/22 [phone]

## 2022-08-18 NOTE — ED Notes (Signed)
Patient assisted to the bedside toilet and then back to bed without incident. Urine obtained.

## 2022-08-18 NOTE — Assessment & Plan Note (Signed)
Patient has a history of HFpEF currently being managed with torsemide.  On examination, she is euvolemic.  Will hold home torsemide given n.p.o. status with poor p.o. intake over the last several days.  Will monitor closely given risk for pulmonary edema.  - Daily weights - Strict in and out - Continuous pulse oximetry - Restart torsemide when able

## 2022-08-19 ENCOUNTER — Inpatient Hospital Stay: Payer: Medicare Other | Admitting: Radiology

## 2022-08-19 DIAGNOSIS — K810 Acute cholecystitis: Secondary | ICD-10-CM | POA: Diagnosis not present

## 2022-08-19 HISTORY — PX: IR PERC CHOLECYSTOSTOMY: IMG2326

## 2022-08-19 LAB — CBC
HCT: 33.1 % — ABNORMAL LOW (ref 36.0–46.0)
Hemoglobin: 11.1 g/dL — ABNORMAL LOW (ref 12.0–15.0)
MCH: 32.8 pg (ref 26.0–34.0)
MCHC: 33.5 g/dL (ref 30.0–36.0)
MCV: 97.9 fL (ref 80.0–100.0)
Platelets: 184 10*3/uL (ref 150–400)
RBC: 3.38 MIL/uL — ABNORMAL LOW (ref 3.87–5.11)
RDW: 13.3 % (ref 11.5–15.5)
WBC: 12.8 10*3/uL — ABNORMAL HIGH (ref 4.0–10.5)
nRBC: 0 % (ref 0.0–0.2)

## 2022-08-19 LAB — COMPREHENSIVE METABOLIC PANEL
ALT: 19 U/L (ref 0–44)
AST: 37 U/L (ref 15–41)
Albumin: 2.9 g/dL — ABNORMAL LOW (ref 3.5–5.0)
Alkaline Phosphatase: 217 U/L — ABNORMAL HIGH (ref 38–126)
Anion gap: 10 (ref 5–15)
BUN: 13 mg/dL (ref 8–23)
CO2: 24 mmol/L (ref 22–32)
Calcium: 7.7 mg/dL — ABNORMAL LOW (ref 8.9–10.3)
Chloride: 97 mmol/L — ABNORMAL LOW (ref 98–111)
Creatinine, Ser: 1.05 mg/dL — ABNORMAL HIGH (ref 0.44–1.00)
GFR, Estimated: 49 mL/min — ABNORMAL LOW (ref >60)
Glucose, Bld: 80 mg/dL (ref 70–99)
Potassium: 3.3 mmol/L — ABNORMAL LOW (ref 3.5–5.1)
Sodium: 131 mmol/L — ABNORMAL LOW (ref 135–145)
Total Bilirubin: 1 mg/dL (ref 0.3–1.2)
Total Protein: 5.9 g/dL — ABNORMAL LOW (ref 6.5–8.1)

## 2022-08-19 LAB — PROTIME-INR
INR: 1.4 — ABNORMAL HIGH (ref 0.8–1.2)
Prothrombin Time: 16.6 seconds — ABNORMAL HIGH (ref 11.4–15.2)

## 2022-08-19 MED ORDER — LEVOFLOXACIN IN D5W 500 MG/100ML IV SOLN
500.0000 mg | INTRAVENOUS | Status: AC
  Filled 2022-08-19 (×2): qty 100

## 2022-08-19 MED ORDER — FENTANYL CITRATE (PF) 100 MCG/2ML IJ SOLN
INTRAMUSCULAR | Status: AC | PRN
  Administered 2022-08-19 (×2): 25 ug via INTRAVENOUS

## 2022-08-19 MED ORDER — HYDROMORPHONE HCL 1 MG/ML IJ SOLN
1.0000 mg | INTRAMUSCULAR | Status: DC | PRN
  Administered 2022-08-19 – 2022-08-20 (×2): 1 mg via INTRAVENOUS
  Filled 2022-08-19 (×2): qty 1

## 2022-08-19 MED ORDER — FENTANYL CITRATE (PF) 100 MCG/2ML IJ SOLN
INTRAMUSCULAR | Status: AC
  Filled 2022-08-19: qty 2

## 2022-08-19 MED ORDER — MIDAZOLAM HCL 2 MG/2ML IJ SOLN
INTRAMUSCULAR | Status: AC | PRN
  Administered 2022-08-19: .5 mg via INTRAVENOUS

## 2022-08-19 MED ORDER — LEVOFLOXACIN IN D5W 250 MG/50ML IV SOLN
INTRAVENOUS | Status: AC | PRN
  Administered 2022-08-19: 500 mg via INTRAVENOUS

## 2022-08-19 MED ORDER — POTASSIUM CHLORIDE CRYS ER 20 MEQ PO TBCR
40.0000 meq | EXTENDED_RELEASE_TABLET | Freq: Two times a day (BID) | ORAL | Status: AC
  Administered 2022-08-19: 40 meq via ORAL
  Filled 2022-08-19: qty 2

## 2022-08-19 MED ORDER — LIDOCAINE HCL 1 % IJ SOLN
4.0000 mL | Freq: Once | INTRAMUSCULAR | Status: AC
  Administered 2022-08-19: 4 mL via INTRADERMAL
  Filled 2022-08-19: qty 4

## 2022-08-19 MED ORDER — SODIUM CHLORIDE 0.9% FLUSH
5.0000 mL | Freq: Three times a day (TID) | INTRAVENOUS | Status: DC
  Administered 2022-08-19 – 2022-08-23 (×12): 5 mL

## 2022-08-19 MED ORDER — MIDAZOLAM HCL 2 MG/2ML IJ SOLN
INTRAMUSCULAR | Status: AC
  Filled 2022-08-19: qty 2

## 2022-08-19 MED ORDER — POTASSIUM CHLORIDE 10 MEQ/100ML IV SOLN
10.0000 meq | INTRAVENOUS | Status: DC
  Administered 2022-08-19: 10 meq via INTRAVENOUS
  Filled 2022-08-19: qty 100

## 2022-08-19 MED ORDER — LIDOCAINE HCL 1 % IJ SOLN
INTRAMUSCULAR | Status: AC
  Filled 2022-08-19: qty 20

## 2022-08-19 MED ORDER — MORPHINE SULFATE ER 15 MG PO TBCR
30.0000 mg | EXTENDED_RELEASE_TABLET | Freq: Two times a day (BID) | ORAL | Status: DC
  Administered 2022-08-19 – 2022-08-20 (×3): 30 mg via ORAL
  Filled 2022-08-19 (×3): qty 2

## 2022-08-19 NOTE — Progress Notes (Signed)
Progress Note   Patient: Allison Novak GNF:[id number] DOB: 01/12/1926 DOA: 08/18/2022     1 DOS: the patient was seen and examined on 08/19/2022    Subjective:  Patient seen and examined at bedside this morning Admits to significant abdominal pain Patient being planned for percutaneous cholecystectomy by IR today Denies nausea vomiting chest pain or cough  Brief hospital course: Allison Novak is a 89 y.o. Novak with medical history significant of HFpEF, atrial fibrillation on Eliquis, previous PE, hypertension, hyperlipidemia, CAD, osteoporosis, hiatal hernia, CKD stage IIIa, CLL, who presents to the ED due to abdominal pain.   Allison Novak states that for the last 1 week, she has been experiencing right upper and lower quadrant abdominal pain that has been waxing and waning with mild to moderate severity up until this morning, when pain came back but was significantly severe.  Upon arrival to the emergency room CT abdomen was obtained that demonstrated cholelithiasis with severe gallbladder distention and wall thickening concerning for acute cholecystitis surgeon on board as well as IR.    Physical Exam: Allison Novak in no acute distress HENT:     Head: Normocephalic and atraumatic.  Cardiovascular:     Rate and Rhythm: Normal rate. Rhythm irregular.  Pulmonary:     Effort: Pulmonary effort is normal. No respiratory distress.  Abdominal:     General: Bowel sounds are normal. There is no distension.     Tenderness: There is abdominal tenderness in the right upper quadrant  Musculoskeletal:     Cervical back: Neck supple.  Neurological:     General: No focal deficit present.     Mental Status: She is alert and oriented to person, place, and time. Mental status is at      Assessment and Plan:  Acute cholecystitis Patient is presenting with 1 week history of waxing and waning right upper quadrant and lower quadrant abdominal pain that has become suddenly worse today.  CT  imaging with cholelithiasis with evidence of acute cholecystitis.  General surgery has been consulted with potential plans to consult IR.  Patient is high risk with surgery, will start with medical management.   - General surgery consulted; appreciate their recommendations - IR on board planning percutaneous cholecystectomy - Continue ceftriaxone and metronidazole - N.p.o. for now until after surgery - Dilaudid as needed for pain control   Chronic diastolic CHF (congestive heart failure) Patient has a history of HFpEF currently being managed with torsemide.   On examination, she is euvolemic.    Will monitor closely given risk for pulmonary edema. Plan - Daily weights - Strict in and out - Continuous pulse oximetry - Restart torsemide when able   Pleural effusion on right Patient has a history of chronic right-sided pleural effusion in the setting of CLL.  Pleural effusion appears stable at this time.  No indication for thoracentesis   Hypothyroidism - Continue home levothyroxine   Stage 3a chronic kidney disease (CKD) Renal function currently at baseline.  Will monitor while admitted.   - Daily BMP   Chronic persistent atrial fibrillation - Holding home Eliquis in the setting of potential procedure - Continue home metoprolol and digoxin   Essential hypertension - Continue home metoprolol   Advance Care Planning:   Code Status: DNR.    Consults: General surgery, interventional radiology   Data Reviewed: I have reviewed patient's laboratory results showing sodium 131 potassium 3.1 albumin 2.9  Family Communication: No family present at bedside at this time  Disposition:  Status is: Inpatient Patient still continues to need inpatient management given surgical indication and consultation by interventional radiologist as well as surgeon   Time spent: 50 minutes  Vitals:   08/19/22 1151 08/19/22 1205 08/19/22 1215 08/19/22 1230  BP: (!) 145/84 (!) 146/72 (!) 145/61    Pulse: (!) 0 93 98 92  Resp:  (!) 25 (!) 23 (!) 21  Temp:      TempSrc:      SpO2:  (!) 89% 93% 95%  Weight:      Height:         Author: Loyce Dys, MD 08/19/2022 2:32 PM  For on call review [email or web address].

## 2022-08-19 NOTE — Procedures (Signed)
Interventional Radiology Procedure Note  Procedure: [place] and fluoro PERC CHOLECYSTOSTOMY    Complications: None  Estimated Blood Loss:  MIN  Findings: BILE SENT FOR CULTURE    M. Ruel Favors, MD

## 2022-08-19 NOTE — Progress Notes (Signed)
[facility] SURGICAL PROGRESS NOTE (cpt [phone])  Hospital Day(s): 1.   Interval History: Patient seen and examined, no acute events or new complaints overnight. Patient reports she continues to have unchanged RUQ pain. She denied any fever, chills, nausea, emesis. Leukocytosis is stable this morning; 12.8K. Renal function relatively stable; sCr - 1.05; UO - 100 ccs + unmeasured. Hypokalemia to 3.3. NPO this morning. Continues on Rocephin + Flagyl. Awaiting IR evaluation for percutaneous cholecystostomy.   Review of Systems:  Constitutional: denies fever, chills  HEENT: denies cough or congestion  Respiratory: denies any shortness of breath  Cardiovascular: denies chest pain or palpitations  Gastrointestinal: + abdominal pain, denied N/V Genitourinary: denies burning with urination or urinary frequency Musculoskeletal: denies pain, decreased motor or sensation  Vital signs in last 24 hours: [min-max] current  Temp:  [98 F (36.7 C)-98.8 F (37.1 C)] 98.8 F (37.1 C) (04/12 0707) Pulse Rate:  [86-100] 100 (04/12 0707) Resp:  [16-20] 20 (04/12 0707) BP: (113-130)/(58-104) 113/66 (04/12 0707) SpO2:  [89 %-98 %] 95 % (04/12 0707) Weight:  [61.2 kg-62.8 kg] 62.8 kg (04/12 0500)     Height: 5\' 1"  (154.9 cm) Weight: 62.8 kg BMI (Calculated): 26.17   Intake/Output last 2 shifts:  04/11 0701 - 04/12 0700 In: 563.4 [I.V.:463.4; IV Piggyback:100] Out: 100 [Urine:100]   Physical Exam:  Constitutional: alert, cooperative and no distress  HENT: normocephalic without obvious abnormality  Eyes: PERRL, EOM's grossly intact and symmetric  Respiratory: breathing non-labored at rest  Cardiovascular: regular rate and sinus rhythm  Gastrointestinal: soft, she is markedly tender to the RUQ, non-distended, no rebound/guarding Musculoskeletal: no edema or wounds, motor and sensation grossly intact, NT    Labs:     Latest Ref Rng & Units 08/19/2022    4:56 AM 08/18/2022    2:40 PM  06/01/2022    2:39 PM  CBC  WBC 4.0 - 10.5 K/uL 12.8  12.8  5.2   Hemoglobin 12.0 - 15.0 g/dL 45.8  09.9  83.3   Hematocrit 36.0 - 46.0 % 33.1  37.3  38.1   Platelets 150 - 400 K/uL 184  203  168       Latest Ref Rng & Units 08/19/2022    4:56 AM 08/18/2022    2:40 PM 06/01/2022    2:39 PM  CMP  Glucose 70 - 99 mg/dL 80  99  825   BUN 8 - 23 mg/dL 13  14  15    Creatinine 0.44 - 1.00 mg/dL 0.53  9.76  7.34   Sodium 135 - 145 mmol/L 131  129  137   Potassium 3.5 - 5.1 mmol/L 3.3  3.8  3.4   Chloride 98 - 111 mmol/L 97  93  94   CO2 22 - 32 mmol/L 24  24  31    Calcium 8.9 - 10.3 mg/dL 7.7  8.2  8.5   Total Protein 6.5 - 8.1 g/dL 5.9  6.7    Total Bilirubin 0.3 - 1.2 mg/dL 1.0  1.0    Alkaline Phos 38 - 126 U/L 217  86    AST 15 - 41 U/L 37  21    ALT 0 - 44 U/L 19  11       Imaging studies: No new pertinent imaging studies   Assessment/Plan: (ICD-10's: K81.0) 89 y.o. female with likely acute cholecystitis, complicated by advanced age, multiple comorbid disease, and need for anticoagulation   - Again, had long discussion with the patient and  her daughter at bedside. Patient was more interested in surgery this AM; however, given her gallbladder appearance on imaging, her advanced age, and need for anticoagulation, I do strongly feel she is a sub-optimal candidate for surgical intervention as this would certainly carry increased risk of complication in this setting. IN this case, do feel it is prudent to proceed with temporizing cholecystostomy tube placement for 6-8 weeks and then reassess her clinical condition for surgery in interval fashion. Patient and daughter understanding and agreeable with this plan. IR consulted from ED yesterday; appreciate their assistance.    - NPO this morning pending IR evaluation  - Continue IVF support  - Continue IV Abx (Rocephin + Flagyl)  - Monitor abdominal examination; on-going bowel function   - Pain control prn; antiemetics prn   - Monitor  leukocytosis - Further management per primary service; we will follow    All of the above findings and recommendations were discussed with the patient, patient's family (daughter at bedside), and the medical team, and all of patient's and daughter's questions were answered to their expressed satisfaction.   -- Lynden Oxford, PA-C [facility] 08/19/2022, 7:23 AM M-F: 7am - 4pm

## 2022-08-19 NOTE — TOC Initial Note (Signed)
Transition of Care [facility]) - Initial/Assessment Note    Patient Details  Name: Allison Novak MRN: [id number] Date of Birth: Aug 25, 1925  Transition of Care [facility]) CM/SW Contact:    Margarito Liner, LCSW Phone Number: 08/19/2022, 11:31 AM  Clinical Narrative: Readmission prevention screen complete. Patient off unit for procedure. CSW met with son at bedside, introduced role, and explained that discharge planning would be discussed. PCP is Daniel Nones, MD. Son or daughter transports her to appointments. Pharmacy is [facility] on the corner of [address] and [address]. No issues obtaining medications. Patient is a resident at [facility]. No home health prior to admission. Patient uses a RW to get around at the facility. No further concerns. CSW encouraged patient's son to contact CSW as needed. CSW will continue to follow patient and her son for support and facilitate return to ALF once medically stable. Son or daughter will transport her back to the facility at discharge.                 Expected Discharge Plan: Assisted Living Barriers to Discharge: Continued Medical Work up   Patient Goals and CMS Choice            Expected Discharge Plan and Services     Post Acute Care Choice: NA Living arrangements for the past 2 months: Assisted Living Facility                                      Prior Living Arrangements/Services Living arrangements for the past 2 months: Assisted Living Facility Lives with:: Facility Resident Patient language and need for interpreter reviewed:: Yes        Need for Family Participation in Patient Care: Yes (Comment) Care giver support system in place?: Yes (comment) Current home services: DME Criminal Activity/Legal Involvement Pertinent to Current Situation/Hospitalization: No - Comment as needed  Activities of Daily Living Home Assistive Devices/Equipment: Dan Humphreys (specify type) ADL Screening (condition at time of  admission) Patient's cognitive ability adequate to safely complete daily activities?: Yes Is the patient deaf or have difficulty hearing?: Yes Does the patient have difficulty seeing, even when wearing glasses/contacts?: No Does the patient have difficulty concentrating, remembering, or making decisions?: No Patient able to express need for assistance with ADLs?: No Does the patient have difficulty dressing or bathing?: No Independently performs ADLs?: Yes (appropriate for developmental age) Does the patient have difficulty walking or climbing stairs?: No Weakness of Legs: Both Weakness of Arms/Hands: Both  Permission Sought/Granted Permission sought to share information with : Facility Medical sales representative, Family Supports          Permission granted to share info w Relationship: Son     Emotional Assessment       Orientation: : Oriented to Self, Oriented to Place, Oriented to  Time, Oriented to Situation Alcohol / Substance Use: Not Applicable Psych Involvement: No (comment)  Admission diagnosis:  Acute cholecystitis [K81.0] Cholecystitis [K81.9] Patient Active Problem List   Diagnosis Date Noted   Acute cholecystitis 08/18/2022   Gout 06/01/2022   Sepsis due to pneumonia 04/30/2022   Acute bronchitis due to respiratory syncytial virus (RSV) 04/30/2022   Aspiration pneumonia 04/30/2022   CLL (chronic lymphocytic leukemia) 01/18/2022   Iron deficiency anemia 07/21/2021   Moderate pulmonary hypertension 07/20/2021   Sepsis 07/19/2021   Thrombocytopenia 07/19/2021   Acute urinary retention 07/18/2021   History of pulmonary  embolism 07/18/2021   Hypothermia 07/18/2021   Pleural effusion on right 07/18/2021   Chronic diastolic CHF (congestive heart failure) 07/17/2021   HLD (hyperlipidemia) 07/17/2021   Pulmonary embolism 07/17/2021   Stage 3a chronic kidney disease (CKD) 07/17/2021   Hypothyroidism 07/17/2021   Acute respiratory failure with hypoxia 07/17/2021    Acquired thrombophilia 06/22/2021   Anemia of chronic disease 06/07/2021   Permanent atrial fibrillation 06/07/2021   Hyponatremia 06/07/2021   Acute on chronic combined systolic and diastolic CHF (congestive heart failure) 06/07/2021   Atrial fibrillation, chronic 08/04/2020   Acute exacerbation of CHF (congestive heart failure) 08/04/2020   Shortness of breath 08/03/2020   Hypokalemia    Palpitations    Hyperlipidemia 02/16/2020   Atrial fibrillation with RVR 02/16/2020   Hypoxia 02/16/2020   Acute CHF (congestive heart failure) 02/16/2020   Chronic anticoagulation 02/16/2020   Senile purpura 11/21/2019   Nodule of upper lobe of left lung 10/30/2017   History of TIA (transient ischemic attack) 02/23/2017   Carotid artery disease 02/23/2017   TIA (transient ischemic attack) 02/18/2017   Hiatal hernia with GERD without esophagitis 02/18/2017   History of nonmelanoma skin cancer 08/10/2015   Gallstones 06/30/2015   Chest pain 06/23/2015   Essential hypertension 06/16/2015   Aortic atherosclerosis 06/16/2015   Diverticulosis of large intestine without hemorrhage 06/16/2015   Age-related osteoporosis without current pathological fracture 10/22/2013   Chronic ankle pain 06/28/2011   PCP:  Lynnea Ferrier, MD Pharmacy:   [facility] [address] [place], [place] - [address] [place] [address] Phone: [phone] Fax: [phone]  [id number] [place], [place] - [address] AT [facility] [address] [place] [address] Phone: [phone] Fax: [phone]     Social Determinants of Health (SDOH) Social History: SDOH Screenings   Food Insecurity: No Food Insecurity (08/18/2022)  Housing: Low Risk  (08/18/2022)  Transportation Needs: No Transportation Needs (08/18/2022)  Utilities: Not At Risk (08/18/2022)  Tobacco Use: Low Risk  (08/18/2022)   SDOH Interventions:      Readmission Risk Interventions    08/19/2022   11:29 AM  Readmission Risk Prevention Plan  Transportation Screening Complete  PCP or Specialist Appt within 3-5 Days Complete  Social Work Consult for Recovery Care Planning/Counseling Complete  Palliative Care Screening Not Applicable  Medication Review Oceanographer) Complete

## 2022-08-19 NOTE — Consult Note (Signed)
Chief Complaint: Patient was seen in consultation today for acute cholecystitis  Referring Physician(s): Verdene Lennert, MD  Supervising Physician: Ruel Favors  Patient Status: [facility] - In-pt  History of Present Illness: Allison Novak is a 89 y.o. female with PMH significant for atrial fibrillation, anemia, CHF, CKD, and hypertension being seen today in relation to acute cholecystitis. Patient presented to [facility] ED on 4/11 and was admitted for acute cholecystitis. Patient was evaluated by Surgery who recommended percutaneous cholecystostomy drain placement.   Past Medical History:  Diagnosis Date   A-fib    Anemia    Carotid artery stenosis    CHF (congestive heart failure)    Chronic kidney disease    Coronary artery disease    Diverticulosis    GERD (gastroesophageal reflux disease)    Gout    Hiatal hernia    Hyperlipidemia    Hypertension    Osteoporosis    Pulmonary embolus     Past Surgical History:  Procedure Laterality Date   ABDOMINAL HYSTERECTOMY     ANKLE SURGERY Left 2011   KNEE ARTHROSCOPY Right     Allergies: Keflex [cephalexin] and Macrodantin [nitrofurantoin macrocrystal]  Medications: Prior to Admission medications   Medication Sig Start Date End Date Taking? Authorizing Provider  albuterol (ACCUNEB) 1.25 MG/3ML nebulizer solution Take 1 ampule by nebulization every 6 (six) hours as needed for wheezing.   Yes [provider]  albuterol (VENTOLIN HFA) 108 (90 Base) MCG/ACT inhaler Inhale 2 puffs into the lungs every 6 (six) hours as needed for wheezing or shortness of breath. 04/26/22  Yes [provider]  allopurinol (ZYLOPRIM) 100 MG tablet Take 100 mg by mouth daily.   Yes [provider]  atorvastatin (LIPITOR) 40 MG tablet Take 40 mg by mouth daily. 11/18/16  Yes [provider]  calcium carbonate (OSCAL) 1500 (600 Ca) MG TABS tablet Take 1 tablet by mouth in the morning and at bedtime.   Yes [provider]  Cholecalciferol 50 MCG (2000 UT) CAPS Take 2,000 Units by mouth daily.   Yes [provider]  digoxin (LANOXIN) 0.125 MG tablet Take 0.0625 mg by mouth daily. 08/27/21  Yes [provider]  ELIQUIS 2.5 MG TABS tablet Take 2.5 mg by mouth 2 (two) times daily. 02/13/20  Yes [provider]  esomeprazole (NEXIUM) 40 MG capsule Take 40 mg by mouth daily.   Yes [provider]  levothyroxine (SYNTHROID) 25 MCG tablet Take 25 mcg by mouth daily. 07/14/21  Yes [provider]  Multiple Vitamin (MULTI-VITAMIN) tablet Take 1 tablet by mouth daily.   Yes [provider]  polyvinyl alcohol (LIQUIFILM TEARS) 1.4 % ophthalmic solution Place 2 drops into the left eye every 8 (eight) hours as needed for dry eyes. 07/26/21  Yes [provider]  potassium chloride (KLOR-CON) 10 MEQ tablet Take 10 mEq by mouth daily as needed. 07/26/22  Yes [provider]  torsemide (DEMADEX) 20 MG tablet Take 2 tablets (40 mg total) by mouth daily. 05/04/22  Yes Lurene Shadow, MD  Azelastine-Fluticasone 137-50 MCG/ACT SUSP Place 1 puff into the nose daily. Patient not taking: Reported on 06/01/2022    [provider]  beclomethasone (QVAR) 40 MCG/ACT inhaler Inhale into the lungs. Patient not taking: Reported on 06/01/2022 05/16/22   [provider]  benzonatate (TESSALON) 200 MG capsule Take by mouth. Patient not taking: Reported on 06/01/2022 05/12/22   [provider]  metoprolol succinate (TOPROL-XL) 50 MG 24 hr  tablet Take 1 tablet (50 mg total) by mouth daily. Take with or immediately following a meal. 06/11/21   Wouk, Wilfred Curtis, MD  predniSONE (DELTASONE) 20 MG tablet Take 20 mg by mouth daily. Patient not taking: Reported on 06/01/2022 05/12/22   [provider]     Family History  Problem Relation Age of Onset   CAD Mother    Hypertension Sister    COPD Brother     Social History   Socioeconomic  History   Marital status: Widowed    Spouse name: Not on file   Number of children: Not on file   Years of education: Not on file   Highest education level: Not on file  Occupational History   Not on file  Tobacco Use   Smoking status: Never   Smokeless tobacco: Never  Substance and Sexual Activity   Alcohol use: No    Alcohol/week: 0.0 standard drinks of alcohol   Drug use: No   Sexual activity: Never  Other Topics Concern   Not on file  Social History Narrative   Not on file   Social Determinants of Health   Financial Resource Strain: Not on file  Food Insecurity: No Food Insecurity (08/18/2022)   Hunger Vital Sign    Worried About Running Out of Food in the Last Year: Never true    Ran Out of Food in the Last Year: Never true  Transportation Needs: No Transportation Needs (08/18/2022)   PRAPARE - Administrator, Civil Service (Medical): No    Lack of Transportation (Non-Medical): No  Physical Activity: Not on file  Stress: Not on file  Social Connections: Not on file    Code Status: Patient currently has DNR order in place. Discussion with the patient and family regarding wishes.  The DNR order is rescinded during the procedure and the patient consents to the use of any resuscitation procedure needed to treat the clinical events that occur.   Review of Systems: A 12 point ROS discussed and pertinent positives are indicated in the HPI above.  All other systems are negative.  Review of Systems  Constitutional:  Positive for chills. Negative for fever.  Respiratory:  Negative for chest tightness and shortness of breath.   Cardiovascular:  Negative for chest pain and leg swelling.  Gastrointestinal:  Positive for abdominal pain. Negative for diarrhea, nausea and vomiting.  Neurological:  Negative for dizziness and headaches.  Psychiatric/Behavioral:  Negative for confusion.     Vital Signs: BP 113/66 (BP Location: Left Arm)   Pulse 100   Temp 98.8 F  (37.1 C) (Oral)   Resp 20   Ht  (1.549 m)   Wt 138 lb 7.2 oz (62.8 kg)   SpO2 95%   BMI 26.16 kg/m    Physical Exam Vitals reviewed.  Constitutional:      General: She is not in acute distress.    Appearance: She is ill-appearing.  HENT:     Mouth/Throat:     Mouth: Mucous membranes are moist.  Eyes:     Pupils: Pupils are equal, round, and reactive to light.  Cardiovascular:     Rate and Rhythm: Regular rhythm. Tachycardia present.     Pulses: Normal pulses.     Heart sounds: Normal heart sounds.  Pulmonary:     Effort: Pulmonary effort is normal.     Breath sounds: Normal breath sounds.  Abdominal:     Palpations: Abdomen is soft.  Tenderness: There is abdominal tenderness.  Musculoskeletal:     Right lower leg: No edema.     Left lower leg: No edema.  Skin:    General: Skin is warm and dry.  Neurological:     Mental Status: She is alert and oriented to person, place, and time.  Psychiatric:        Mood and Affect: Mood normal.        Behavior: Behavior normal.        Thought Content: Thought content normal.        Judgment: Judgment normal.     Imaging: CT ABDOMEN PELVIS WO CONTRAST  Result Date: 08/18/2022 CLINICAL DATA:  Right lower quadrant abdominal pain. EXAM: CT ABDOMEN AND PELVIS WITHOUT CONTRAST TECHNIQUE: Multidetector CT imaging of the abdomen and pelvis was performed following the standard protocol without IV contrast. RADIATION DOSE REDUCTION: This exam was performed according to the departmental dose-optimization program which includes automated exposure control, adjustment of the mA and/or kV according to patient size and/or use of iterative reconstruction technique. COMPARISON:  October 12, 2017. FINDINGS: Lower chest: Large hiatal hernia is noted. Moderate right pleural effusion is noted with adjacent subsegmental atelectasis of the right lower lobe. Hepatobiliary: Cholelithiasis is noted with severe gallbladder distention and wall thickening  concerning for acute cholecystitis. No biliary dilatation is noted. Liver is unremarkable. Pancreas: Unremarkable. No pancreatic ductal dilatation or surrounding inflammatory changes. Spleen: Normal in size without focal abnormality. Adrenals/Urinary Tract: Adrenal glands are unremarkable. Kidneys are normal, without renal calculi, focal lesion, or hydronephrosis. Bladder is unremarkable. Stomach/Bowel: Stomach is within normal limits. Appendix appears normal. No evidence of bowel wall thickening, distention, or inflammatory changes. Sigmoid diverticulosis is noted without inflammation. Vascular/Lymphatic: Aortic atherosclerosis. No enlarged abdominal or pelvic lymph nodes. Reproductive: Status post hysterectomy. No adnexal masses. Other: Small amount of free fluid is noted which most likely is physiologic. No hernia is noted. Musculoskeletal: No acute or significant osseous findings. IMPRESSION: Cholelithiasis with severe gallbladder distension and wall thickening concerning for acute cholecystitis. These results will be called to the ordering clinician or representative by the Radiologist Assistant, and communication documented in the PACS or zVision Dashboard. Large sliding-type hiatal hernia is noted. Moderate right pleural effusion is noted with adjacent subsegmental atelectasis. Sigmoid diverticulosis without inflammation. Aortic Atherosclerosis (ICD10-I70.0). Electronically Signed   By: Lupita Raider M.D.   On: 08/18/2022 12:53    Labs:  CBC: Recent Labs    05/01/22 0459 06/01/22 1439 08/18/22 1440 08/19/22 0456  WBC 5.8 5.2 12.8* 12.8*  HGB 11.6* 12.0 12.4 11.1*  HCT 35.8* 38.1 37.3 33.1*  PLT 144* 168 203 184    COAGS: Recent Labs    05/01/22 0459  INR 1.3*    BMP: Recent Labs    05/01/22 0459 06/01/22 1439 08/18/22 1440 08/19/22 0456  NA 138 137 129* 131*  K 4.3 3.4* 3.8 3.3*  CL 99 94* 93* 97*  CO2 33* 31 24 24   GLUCOSE 108* 160* 99 80  BUN 19 15 14 13   CALCIUM 8.4*  8.5* 8.2* 7.7*  CREATININE 0.94 1.07* 1.10* 1.05*  GFRNONAA 56* 48* 47* 49*    LIVER FUNCTION TESTS: Recent Labs    08/18/22 1440 08/19/22 0456  BILITOT 1.0 1.0  AST 21 37  ALT 11 19  ALKPHOS 86 217*  PROT 6.7 5.9*  ALBUMIN 3.5 2.9*    TUMOR MARKERS: No results for input(s): "AFPTM", "CEA", "CA199", "CHROMGRNA" in the last 8760 hours.  Assessment and Plan:  Allison Novak is a 89 yo female with acute cholecystitis. Patient found to not be an ideal candidate for surgery, IR subsequently consulted. The patient is NPO. Case has been reviewed and approved by Dr Miles Costain for image-guided percutaneous cholecystostomy drain placement. Patient is NPO.  Risks and benefits discussed with the patient including, but not limited to bleeding, infection, gallbladder perforation, bile leak, sepsis or even death.  All of the patient's questions were answered, patient is agreeable to proceed. Consent signed and in chart.   Thank you for this interesting consult.  I greatly enjoyed meeting Allison Novak and look forward to participating in their care.  A copy of this report was sent to the requesting provider on this date.  Electronically Signed: Kennieth Francois, PA-C 08/19/2022, 9:44 AM   I spent a total of 40 Minutes    in face to face in clinical consultation, greater than 50% of which was counseling/coordinating care for acute cholecystitis.

## 2022-08-20 DIAGNOSIS — K810 Acute cholecystitis: Secondary | ICD-10-CM | POA: Diagnosis not present

## 2022-08-20 LAB — CBC WITH DIFFERENTIAL/PLATELET
Abs Immature Granulocytes: 0.07 10*3/uL (ref 0.00–0.07)
Basophils Absolute: 0 10*3/uL (ref 0.0–0.1)
Basophils Relative: 0 %
Eosinophils Absolute: 0.1 10*3/uL (ref 0.0–0.5)
Eosinophils Relative: 1 %
HCT: 36.5 % (ref 36.0–46.0)
Hemoglobin: 11.9 g/dL — ABNORMAL LOW (ref 12.0–15.0)
Immature Granulocytes: 1 %
Lymphocytes Relative: 9 %
Lymphs Abs: 1.1 10*3/uL (ref 0.7–4.0)
MCH: 32.6 pg (ref 26.0–34.0)
MCHC: 32.6 g/dL (ref 30.0–36.0)
MCV: 100 fL (ref 80.0–100.0)
Monocytes Absolute: 1 10*3/uL (ref 0.1–1.0)
Monocytes Relative: 8 %
Neutro Abs: 10 10*3/uL — ABNORMAL HIGH (ref 1.7–7.7)
Neutrophils Relative %: 81 %
Platelets: 210 10*3/uL (ref 150–400)
RBC: 3.65 MIL/uL — ABNORMAL LOW (ref 3.87–5.11)
RDW: 13.4 % (ref 11.5–15.5)
WBC: 12.3 10*3/uL — ABNORMAL HIGH (ref 4.0–10.5)
nRBC: 0 % (ref 0.0–0.2)

## 2022-08-20 LAB — BASIC METABOLIC PANEL WITH GFR
Anion gap: 11 (ref 5–15)
BUN: 21 mg/dL (ref 8–23)
CO2: 23 mmol/L (ref 22–32)
Calcium: 8.2 mg/dL — ABNORMAL LOW (ref 8.9–10.3)
Chloride: 99 mmol/L (ref 98–111)
Creatinine, Ser: 1.34 mg/dL — ABNORMAL HIGH (ref 0.44–1.00)
GFR, Estimated: 37 mL/min — ABNORMAL LOW
Glucose, Bld: 76 mg/dL (ref 70–99)
Potassium: 4.6 mmol/L (ref 3.5–5.1)
Sodium: 133 mmol/L — ABNORMAL LOW (ref 135–145)

## 2022-08-20 LAB — AEROBIC/ANAEROBIC CULTURE W GRAM STAIN (SURGICAL/DEEP WOUND)

## 2022-08-20 MED ORDER — SODIUM CHLORIDE 0.9 % IV SOLN: INTRAVENOUS | Status: AC

## 2022-08-20 NOTE — Progress Notes (Addendum)
   08/20/22 1002  Assess: MEWS Score  Temp 99.3 F (37.4 C)  BP 115/78  Pulse Rate (!) 128  Resp 20  Level of Consciousness Alert  SpO2 95 %  O2 Device Nasal Cannula  O2 Flow Rate (L/min) 2 L/min  Assess: MEWS Score  MEWS Temp 0  MEWS Systolic 0  MEWS Pulse 2  MEWS RR 0  MEWS LOC 0  MEWS Score 2  MEWS Score Color Yellow  Assess: if the MEWS score is Yellow or Red  Were vital signs taken at a resting state? Yes  Focused Assessment No change from prior assessment  Does the patient meet 2 or more of the SIRS criteria? No  Does the patient have a confirmed or suspected source of infection? No  Provider and Rapid Response Notified? Yes (provider notified)  MEWS guidelines implemented  Yes, yellow  Treat  MEWS Interventions Considered administering scheduled or prn medications/treatments as ordered  Take Vital Signs  Increase Vital Sign Frequency  Yellow: Q2hr x1, continue Q4hrs until patient remains green for 12hrs  Escalate  MEWS: Escalate Yellow: Discuss with charge nurse and consider notifying provider and/or RRT  Notify: Charge Nurse/RN  Name of Charge Nurse/RN Notified Alcario Drought, RN  Provider Notification  Provider Name/Title Rosezetta Schlatter MD  Date Provider Notified 08/20/22  Time Provider Notified 1020  Method of Notification Page  Notification Reason Other (Comment) (MEWS yellow)  Provider response No new orders  Date of Provider Response 08/20/22  Time of Provider Response 1040  Assess: SIRS CRITERIA  SIRS Temperature  0  SIRS Pulse 1  SIRS Respirations  0  SIRS WBC 1  SIRS Score Sum  2   Patient's Mews yellow. Patient is tachycardiac at rest. Metoprolol 50mg  given at 0913 Charge nurse and MD notified. No new orders at this time. Mews guidelines in place.

## 2022-08-20 NOTE — Progress Notes (Signed)
Progress Note   Patient: Allison Novak LZJ:[id number] DOB: [birth year] DOA: 08/18/2022     2 DOS: the patient was seen and examined on 08/20/2022     Subjective:  Patient seen and examined at bedside this morning She tells me her abdominal pain is slightly better Patient underwent percutaneous cholecystostomy by IR on 08/19/2022 Cholecystostomy tube in place and draining appropriately Denies nausea vomiting chest pain or cough   Brief hospital course: [name] is a 89 y.o. female with medical history significant of HFpEF, atrial fibrillation on Eliquis, previous PE, hypertension, hyperlipidemia, CAD, osteoporosis, hiatal hernia, CKD stage IIIa, CLL, who presents to the ED due to abdominal pain.   Allison Novak states that for the last 1 week, she has been experiencing right upper and lower quadrant abdominal pain that has been waxing and waning with mild to moderate severity up until this morning, when pain came back but was significantly severe.  Upon arrival to the emergency room CT abdomen was obtained that demonstrated cholelithiasis with severe gallbladder distention and wall thickening concerning for acute cholecystitis surgeon on board as well as IR. Patient underwent percutaneous cholecystostomy by IR on 08/19/2022 Cholecystostomy tube in place and draining appropriately       Physical Exam: Elderly female in no acute distress HENT:     Head: Normocephalic and atraumatic.  Cardiovascular:     Rate and Rhythm: Normal rate.  Tachycardic Pulmonary:     Effort: Pulmonary effort is normal. No respiratory distress.  Abdominal: Cholecystostomy tube in place    General: Bowel sounds are normal. There is no distension.     Tenderness: There is abdominal tenderness in the right upper quadrant  Musculoskeletal:     Cervical back: Neck supple.  Neurological:     General: No focal deficit present.     Mental Status: She is alert and oriented to person, place, and time. Mental status  is at         Assessment and Plan:   Acute cholecystitis Patient is presenting with 1 week history of waxing and waning right upper quadrant and lower quadrant abdominal pain that has become suddenly worse today.  CT imaging with cholelithiasis with evidence of acute cholecystitis.   Plan - General surgery consulted; appreciate their recommendations - IPatient underwent percutaneous cholecystostomy by IR on 08/19/2022 Cholecystostomy tube in place and draining appropriately - Continue ceftriaxone and metronidazole - N.p.o. for now until after surgery - Dilaudid as needed for pain control   Chronic diastolic CHF (congestive heart failure) Patient has a history of HFpEF currently being managed with torsemide.   On examination, she is euvolemic.    Will monitor closely given risk for pulmonary edema. Plan - Daily weights - Strict in and out - Continuous pulse oximetry - Holding torsemide in the setting of AKI   Pleural effusion on right Patient has a history of chronic right-sided pleural effusion in the setting of CLL.  Pleural effusion appears stable at this time.  No indication for thoracentesis   Hypothyroidism - Continue home levothyroxine   AKI on stage 3a chronic kidney disease (CKD) Renal function slightly higher than baseline Placed on IV fluid therapy Avoid nephrotoxic medications - Daily BMP   Chronic persistent atrial fibrillation - Resume Eliquis after surgical clearance - Continue home metoprolol and digoxin   Essential hypertension - Continue home metoprolol   Advance Care Planning:   Code Status: DNR.     Consults: General surgery, interventional radiology   Data Reviewed:  I reviewed the patient's laboratory results today showing slightly worsening renal function with creatinine of 1.3  Family Communication: Discussed with patient's daughter present at bedside   Disposition: Status is: Inpatient Patient still continues to need inpatient management  given surgical indication and consultation by interventional radiologist as well as surgeon     Time spent: 50 minutes  Vitals:   08/20/22 0913 08/20/22 1000 08/20/22 1002 08/20/22 1152  BP: 127/70 115/78 115/78 (!) 108/59  Pulse: (!) 118 (!) 128 (!) 128 (!) 114  Resp:  Temp:  99.3 F (37.4 C) 99.3 F (37.4 C) 98.9 F (37.2 C)  TempSrc:  Oral Oral Oral  SpO2:  95% 95% 96%  Weight:      Height:         Author: Loyce Dys, MD 08/20/2022 12:15 PM  For on call review [email or web address].

## 2022-08-20 NOTE — Progress Notes (Signed)
Temp noted to be 101.3.  PRN Tylenol given.  Yellow MEWS continues.  Pt is drowsy, but awakens to touch and name call (pt is also hard of hearing).  Of note, pt doesn't take any scheduled pain medications at home - verified with daughter who is at bedside.  Will discuss with on call provider the scheduled MS Contin.  Pt has PRN's available if needed.   08/20/22 2004  Vitals  Temp (!) 101.3 F (38.5 C)  Temp Source Oral  BP 112/66  MAP (mmHg) 81  BP Location Left Arm  BP Method Automatic  Patient Position (if appropriate) Lying  Pulse Rate 100  Pulse Rate Source Monitor  Resp 12  Level of Consciousness  Level of Consciousness Alert (drowsy, awakens easily)  MEWS COLOR  MEWS Score Color Yellow  Oxygen Therapy  SpO2 94 %  O2 Device Nasal Cannula  O2 Flow Rate (L/min) 2 L/min  Pain Assessment  Pain Scale 0-10  Pain Score 0  POSS Scale (Pasero Opioid Sedation Scale)  POSS *See Group Information* 1-Acceptable,Awake and alert  MEWS Score  MEWS Temp 1  MEWS Systolic 0  MEWS Pulse 0  MEWS RR 1  MEWS LOC 0  MEWS Score 2   Hilton Sinclair BSN RN [facility] 08/20/2022, 8:44 PM

## 2022-08-20 NOTE — Evaluation (Signed)
Physical Therapy Evaluation Patient Details Name: Allison Novak MRN: [id number] DOB: February 06, 1926 Today's Date: 08/20/2022  History of Present Illness  Pt is a 89 y.o. female with medical history significant of HFpEF, atrial fibrillation on Eliquis, previous PE, hypertension, hyperlipidemia, CAD, osteoporosis, hiatal hernia, CKD stage IIIa, CLL, who presents to the ED due to abdominal pain. MD assessment includes: Acute cholecystitis with pt now s/p percutaneous cholecystostomy, pleural effusion on right, AKI, and hypothyroidism.   Clinical Impression  Pt was pleasant and motivated to participate during the session and put forth good effort throughout. Pt found on 2LO2/min with SpO2 at 98%, pt does not use O2 at baseline.  Per nsg ok for trial on room air.  Pt's SpO2 checked on room air after getting to sitting at the EOB with SpO2 in the mid 80s, returned to 2LO2/min with SpO2 returning to the mid 90s.  Pt required physical assistance and cuing for sequencing with all functional tasks and was only able to take 2-3 very effortful side-steps at the EOB before needing to return to sitting.  Pt will benefit from continued PT services upon discharge to safely address deficits listed in patient problem list for decreased caregiver assistance and eventual return to PLOF.         Recommendations for follow up therapy are one component of a multi-disciplinary discharge planning process, led by the attending physician.  Recommendations may be updated based on patient status, additional functional criteria and insurance authorization.  Follow Up Recommendations Can patient physically be transported by private vehicle: No     Assistance Recommended at Discharge Frequent or constant Supervision/Assistance  Patient can return home with the following  Two people to help with walking and/or transfers;A lot of help with bathing/dressing/bathroom;Assist for transportation    Equipment Recommendations None  recommended by PT  Recommendations for Other Services       Functional Status Assessment Patient has had a recent decline in their functional status and demonstrates the ability to make significant improvements in function in a reasonable and predictable amount of time.     Precautions / Restrictions Precautions Precautions: Fall Restrictions Weight Bearing Restrictions: No Other Position/Activity Restrictions: drain in RLQ      Mobility  Bed Mobility Overal bed mobility: Needs Assistance Bed Mobility: Rolling, Sidelying to Sit, Sit to Sidelying Rolling: Mod assist Sidelying to sit: Mod assist     Sit to sidelying: Mod assist General bed mobility comments: Mod A for BLE and trunk control with cues for sequencing during log roll training    Transfers Overall transfer level: Needs assistance Equipment used: Rolling walker (2 wheels) Transfers: Sit to/from Stand Sit to Stand: Min assist, From elevated surface           General transfer comment: Min verbal cues for hand placement    Ambulation/Gait Ambulation/Gait assistance: Min assist Gait Distance (Feet): 1 Feet Assistive device: Rolling walker (2 wheels) Gait Pattern/deviations: Step-to pattern, Trunk flexed, Shuffle Gait velocity: decreased     General Gait Details: Pt able to take 2-3 very small, shuffling steps at the EOB with min A to guide the RW  Stairs            Wheelchair Mobility    Modified Rankin (Stroke Patients Only)       Balance Overall balance assessment: Needs assistance Sitting-balance support: Feet unsupported, Bilateral upper extremity supported Sitting balance-Leahy Scale: Good     Standing balance support: Bilateral upper extremity supported, During functional activity, Reliant on assistive  device for balance Standing balance-Leahy Scale: Fair                               Pertinent Vitals/Pain Pain Assessment Pain Assessment: No/denies pain    Home  Living Family/patient expects to be discharged to:: Assisted living                 Home Equipment: Agricultural consultant (2 wheels) Additional Comments: Home Place resident    Prior Function Prior Level of Function : Needs assist       Physical Assist : ADLs (physical)     Mobility Comments: Mod Ind amb with a RW facility distances, no fall history ADLs Comments: PRN assist from staff with ADLs     Hand Dominance   Dominant Hand: Right    Extremity/Trunk Assessment   Upper Extremity Assessment Upper Extremity Assessment: Generalized weakness    Lower Extremity Assessment Lower Extremity Assessment: Generalized weakness       Communication   Communication: HOH  Cognition Arousal/Alertness: Lethargic Behavior During Therapy: Flat affect Overall Cognitive Status: Within Functional Limits for tasks assessed                                          General Comments      Exercises Other Exercises Other Exercises: Log roll and transfer training   Assessment/Plan    PT Assessment Patient needs continued PT services  PT Problem List Decreased strength;Decreased activity tolerance;Decreased balance;Decreased mobility;Decreased knowledge of use of DME       PT Treatment Interventions DME instruction;Gait training;Functional mobility training;Therapeutic activities;Therapeutic exercise;Balance training;Patient/family education    PT Goals (Current goals can be found in the Care Plan section)  Acute Rehab PT Goals Patient Stated Goal: To return home PT Goal Formulation: With patient Time For Goal Achievement: 09/02/22 Potential to Achieve Goals: Good    Frequency Min 2X/week     Co-evaluation               AM-PAC PT "6 Clicks" Mobility  Outcome Measure Help needed turning from your back to your side while in a flat bed without using bedrails?: A Lot Help needed moving from lying on your back to sitting on the side of a flat bed without  using bedrails?: A Lot Help needed moving to and from a bed to a chair (including a wheelchair)?: A Lot Help needed standing up from a chair using your arms (e.g., wheelchair or bedside chair)?: A Little Help needed to walk in hospital room?: Total Help needed climbing 3-5 steps with a railing? : Total 6 Click Score: 11    End of Session Equipment Utilized During Treatment: Gait belt;Oxygen Activity Tolerance: Patient tolerated treatment well Patient left: in bed;with call bell/phone within reach;with bed alarm set;with nursing/sitter in room;with family/visitor present;Other (comment) (SCD's left off for a break per nursing request) Nurse Communication: Mobility status PT Visit Diagnosis: Unsteadiness on feet (R26.81);Difficulty in walking, not elsewhere classified (R26.2);Muscle weakness (generalized) (M62.81)    Time: 7510-2585 PT Time Calculation (min) (ACUTE ONLY): 26 min   Charges:   PT Evaluation $PT Eval Moderate Complexity: 1 Mod PT Treatments $Therapeutic Activity: 8-22 mins       D. Scott Braelon Sprung PT, DPT 08/20/22, 3:09 PM

## 2022-08-20 NOTE — Consult Note (Signed)
CC: choleCystitis Subjective: Having significant pain.  Cholecystostomy tube working [employer] at 14 HFpEF, atrial fibrillation on Eliquis, previous PE, hypertension, hyperlipidemia, CAD, osteoporosis, hiatal hernia, CKD stage IIIa, CLL  Objective: Vital signs in last 24 hours: Temp:  [98.1 F (36.7 C)-99.3 F (37.4 C)] 98.9 F (37.2 C) (04/13 1152) Pulse Rate:  [94-128] 114 (04/13 1152) Resp:  [18-20] 18 (04/13 1152) BP: (108-127)/(59-78) 108/59 (04/13 1152) SpO2:  [88 %-99 %] 96 % (04/13 1152) Last BM Date : 08/18/22  Intake/Output from previous day: 04/12 0701 - 04/13 0700 In: 263 [P.O.:240; I.V.:8] Out: 250 [Drains:250] Intake/Output this shift: Total I/O In: 125 [P.O.:120; Other:5] Out: -   Physical exam:  Constitutional: alert, cooperative and no distress  HENT: normocephalic without obvious abnormality  Respiratory: breathing non-labored at rest  Cardiovascular: iregular rate and sinus rhythm  Gastrointestinal: soft, she is  tender to the RUQ, non-distended, no rebound/guarding cholecystostomy tube in place Musculoskeletal: no edema or wounds, motor and sensation grossly intact, NT    Lab Results: CBC  Recent Labs    08/19/22 0456 08/20/22 0523  WBC 12.8* 12.3*  HGB 11.1* 11.9*  HCT 33.1* 36.5  PLT 184 210   BMET Recent Labs    08/19/22 0456 08/20/22 0850  NA 131* 133*  K 3.3* 4.6  CL 97* 99  CO2 24 23  GLUCOSE 80 76  BUN 13 21  CREATININE 1.05* 1.34*  CALCIUM 7.7* 8.2*   PT/INR Recent Labs    08/19/22 0939  LABPROT 16.6*  INR 1.4*   ABG No results for input(s): "PHART", "HCO3" in the last 72 hours.  Invalid input(s): "PCO2", "PO2"  Studies/Results: IR Perc Cholecystostomy  Result Date: 08/19/2022 INDICATION: Acute cholecystitis, non operative candidate EXAM: ULTRASOUND FLUOROSCOPIC PERCUTANEOUS TRANSHEPATIC CHOLECYSTOSTOMY MEDICATIONS: patient is already receiving iv antibiotics as an inpatient; The antibiotic was  administered within an appropriate time frame prior to the initiation of the procedure. ANESTHESIA/SEDATION: Moderate (conscious) sedation was employed during this procedure. A total of Versed 0.5 mg and Fentanyl 50 mcg was administered intravenously by the radiology nurse. Total intra-service moderate Sedation Time: 10 minutes. The patient's level of consciousness and vital signs were monitored continuously by radiology nursing throughout the procedure under my direct supervision. FLUOROSCOPY: Radiation Exposure Index (as provided by the fluoroscopic device): 2.0 mGy Kerma COMPLICATIONS: None immediate. PROCEDURE: Informed written consent was obtained from the patient after a thorough discussion of the procedural risks, benefits and alternatives. All questions were addressed. Maximal Sterile Barrier Technique was utilized including caps, mask, sterile gowns, sterile gloves, sterile drape, hand hygiene and skin antiseptic. A timeout was performed prior to the initiation of the procedure. Previous imaging reviewed. Preliminary ultrasound performed. The distended thick-walled gallbladder with gallstones was localized in the right mid abdomen. Transhepatic window marked. Under sterile conditions and local anesthesia, a 21 gauge introducer needle was advanced under direct ultrasound transhepatic Leigh into the gallbladder. Needle position confirmed with ultrasound. There was return of bile. Sample sent for culture. Guidewire inserted followed by the transitional dilator set. Amplatz guidewire inserted followed by tract dilatation to insert a 10 [place] drain. Drain catheter position confirmed with ultrasound and fluoroscopy. Retention loop formed. Catheter secured with a silk suture and a sterile dressing. Gravity drainage bag connected. Gallbladder decompressed by syringe aspiration. IMPRESSION: Successful ultrasound and fluoroscopically guided percutaneous cholecystostomy. Bile sample sent for culture. Electronically  Signed   By: Judie Petit.  Shick M.D.   On: 08/19/2022 11:57    Anti-infectives: Anti-infectives (From  admission, onward)    Start     Dose/Rate Route Frequency Ordered Stop   08/19/22 1400  cefTRIAXone (ROCEPHIN) 1 g in sodium chloride 0.9 % 100 mL IVPB        1 g 200 mL/hr over 30 Minutes Intravenous Every 24 hours 08/18/22 1604     08/19/22 1125  Levofloxacin (LEVAQUIN) IVPB        over 60 Minutes  Continuous PRN 08/19/22 1125 08/19/22 1125   08/19/22 1015  levofloxacin (LEVAQUIN) IVPB 500 mg       Note to Pharmacy: On call to Radiology   500 mg 100 mL/hr over 60 Minutes Intravenous On call 08/19/22 0928 08/20/22 1015   08/19/22 0330  metroNIDAZOLE (FLAGYL) IVPB 500 mg        500 mg 100 mL/hr over 60 Minutes Intravenous Every 12 hours 08/18/22 1604     08/18/22 1400  cefTRIAXone (ROCEPHIN) 1 g in sodium chloride 0.9 % 100 mL IVPB        1 g 200 mL/hr over 30 Minutes Intravenous  Once 08/18/22 1359 08/18/22 1527   08/18/22 1400  metroNIDAZOLE (FLAGYL) IVPB 500 mg        500 mg 100 mL/hr over 60 Minutes Intravenous  Once 08/18/22 1359 08/18/22 1734       Assessment/Plan: Cholecystitis status post cholecystostomy tube Continue current management.  Patient is debilitated and not necessarily the best surgical candidate.  Had an extensive discussion with her daughter regarding my thought process potentially in a couple months if she recovers her situation may change. I was very candid about the conversation and she is still high risk for surgical intervention.  They understand. I Spent 50 minutes in this encounter including personally reviewing imaging studies, coordinating her care, placing orders and performing appropriate documentation   Sterling Big, MD, [facility]  08/20/2022

## 2022-08-21 ENCOUNTER — Inpatient Hospital Stay: Payer: Medicare Other

## 2022-08-21 DIAGNOSIS — K819 Cholecystitis, unspecified: Secondary | ICD-10-CM | POA: Diagnosis not present

## 2022-08-21 DIAGNOSIS — K810 Acute cholecystitis: Secondary | ICD-10-CM | POA: Diagnosis not present

## 2022-08-21 LAB — URINALYSIS, ROUTINE W REFLEX MICROSCOPIC
Bacteria, UA: NONE SEEN
Bilirubin Urine: NEGATIVE
Glucose, UA: NEGATIVE mg/dL
Hgb urine dipstick: NEGATIVE
Ketones, ur: 5 mg/dL — AB
Nitrite: NEGATIVE
Protein, ur: NEGATIVE mg/dL
Specific Gravity, Urine: 1.019 (ref 1.005–1.030)
pH: 5 (ref 5.0–8.0)

## 2022-08-21 LAB — BASIC METABOLIC PANEL
Anion gap: 9 (ref 5–15)
BUN: 27 mg/dL — ABNORMAL HIGH (ref 8–23)
CO2: 22 mmol/L (ref 22–32)
Calcium: 8.3 mg/dL — ABNORMAL LOW (ref 8.9–10.3)
Chloride: 105 mmol/L (ref 98–111)
Creatinine, Ser: 1.32 mg/dL — ABNORMAL HIGH (ref 0.44–1.00)
GFR, Estimated: 37 mL/min — ABNORMAL LOW (ref >60)
Glucose, Bld: 82 mg/dL (ref 70–99)
Potassium: 4.5 mmol/L (ref 3.5–5.1)
Sodium: 136 mmol/L (ref 135–145)

## 2022-08-21 LAB — PHOSPHORUS: Phosphorus: 4.1 mg/dL (ref 2.5–4.6)

## 2022-08-21 LAB — MAGNESIUM: Magnesium: 2.2 mg/dL (ref 1.7–2.4)

## 2022-08-21 MED ORDER — PIPERACILLIN-TAZOBACTAM 3.375 G IVPB
3.3750 g | Freq: Three times a day (TID) | INTRAVENOUS | Status: DC
  Administered 2022-08-21 – 2022-08-23 (×6): 3.375 g via INTRAVENOUS
  Filled 2022-08-21 (×6): qty 50

## 2022-08-21 MED ORDER — ALBUMIN HUMAN 25 % IV SOLN
25.0000 g | Freq: Once | INTRAVENOUS | Status: AC
  Administered 2022-08-21: 25 g via INTRAVENOUS
  Filled 2022-08-21: qty 100

## 2022-08-21 MED ORDER — IOHEXOL 300 MG/ML  SOLN
100.0000 mL | Freq: Once | INTRAMUSCULAR | Status: AC | PRN
  Administered 2022-08-21: 100 mL via INTRAVENOUS

## 2022-08-21 MED ORDER — METOPROLOL TARTRATE 5 MG/5ML IV SOLN
5.0000 mg | Freq: Once | INTRAVENOUS | Status: AC
  Administered 2022-08-21: 5 mg via INTRAVENOUS
  Filled 2022-08-21: qty 5

## 2022-08-21 MED ORDER — IOHEXOL 9 MG/ML PO SOLN
500.0000 mL | ORAL | Status: AC
  Administered 2022-08-21 (×2): 500 mL via ORAL

## 2022-08-21 NOTE — Progress Notes (Signed)
CC: choleCystitis Subjective: Pain Improving. Febrile  Cholecystostomy tube working Bmp no change  Objective: Vital signs in last 24 hours: Temp:  [98.5 F (36.9 C)-101.3 F (38.5 C)] 98.5 F (36.9 C) (04/14 0709) Pulse Rate:  [100-129] 105 (04/14 1003) Resp:  [10-18] 10 (04/14 0709) BP: (107-132)/(59-79) 118/69 (04/14 1003) SpO2:  [94 %-99 %] 97 % (04/14 0709) Last BM Date : 08/18/22  Intake/Output from previous day: 04/13 0701 - 04/14 0700 In: 2042.8 [P.O.:380; I.V.:1047.8; IV Piggyback:600] Out: 620 [Urine:450; Drains:170] Intake/Output this shift: Total I/O In: 125 [P.O.:120; Other:5] Out: 1 [Urine:1]   Physical exam:  Constitutional: alert, cooperative and no distress  HENT: normocephalic without obvious abnormality  Respiratory: breathing non-labored at rest  Cardiovascular: iregular rate and sinus rhythm  Gastrointestinal: soft, she is  tender to the RUQ, non-distended, no rebound/guarding cholecystostomy tube in place w green bile Musculoskeletal: no edema or wounds, motor and sensation grossly intact, NT   Lab Results: CBC  Recent Labs    08/19/22 0456 08/20/22 0523  WBC 12.8* 12.3*  HGB 11.1* 11.9*  HCT 33.1* 36.5  PLT 184 210   BMET Recent Labs    08/20/22 0850 08/21/22 0545  NA 133* 136  K 4.6 4.5  CL 99 105  CO2 23 22  GLUCOSE 76 82  BUN 21 27*  CREATININE 1.34* 1.32*  CALCIUM 8.2* 8.3*   PT/INR Recent Labs    08/19/22 0939  LABPROT 16.6*  INR 1.4*   ABG No results for input(s): "PHART", "HCO3" in the last 72 hours.  Invalid input(s): "PCO2", "PO2"  Studies/Results: IR Perc Cholecystostomy  Result Date: 08/19/2022 INDICATION: Acute cholecystitis, non operative candidate EXAM: ULTRASOUND FLUOROSCOPIC PERCUTANEOUS TRANSHEPATIC CHOLECYSTOSTOMY MEDICATIONS: patient is already receiving iv antibiotics as an inpatient; The antibiotic was administered within an appropriate time frame prior to the initiation of the procedure.  ANESTHESIA/SEDATION: Moderate (conscious) sedation was employed during this procedure. A total of Versed 0.5 mg and Fentanyl 50 mcg was administered intravenously by the radiology nurse. Total intra-service moderate Sedation Time: 10 minutes. The patient's level of consciousness and vital signs were monitored continuously by radiology nursing throughout the procedure under my direct supervision. FLUOROSCOPY: Radiation Exposure Index (as provided by the fluoroscopic device): 2.0 mGy Kerma COMPLICATIONS: None immediate. PROCEDURE: Informed written consent was obtained from the patient after a thorough discussion of the procedural risks, benefits and alternatives. All questions were addressed. Maximal Sterile Barrier Technique was utilized including caps, mask, sterile gowns, sterile gloves, sterile drape, hand hygiene and skin antiseptic. A timeout was performed prior to the initiation of the procedure. Previous imaging reviewed. Preliminary ultrasound performed. The distended thick-walled gallbladder with gallstones was localized in the right mid abdomen. Transhepatic window marked. Under sterile conditions and local anesthesia, a 21 gauge introducer needle was advanced under direct ultrasound transhepatic Leigh into the gallbladder. Needle position confirmed with ultrasound. There was return of bile. Sample sent for culture. Guidewire inserted followed by the transitional dilator set. Amplatz guidewire inserted followed by tract dilatation to insert a 10 [place] drain. Drain catheter position confirmed with ultrasound and fluoroscopy. Retention loop formed. Catheter secured with a silk suture and a sterile dressing. Gravity drainage bag connected. Gallbladder decompressed by syringe aspiration. IMPRESSION: Successful ultrasound and fluoroscopically guided percutaneous cholecystostomy. Bile sample sent for culture. Electronically Signed   By: Judie Petit.  Shick M.D.   On: 08/19/2022 11:57    Anti-infectives: Anti-infectives  (From admission, onward)    Start     Dose/Rate Route Frequency Ordered  Stop   08/19/22 1400  cefTRIAXone (ROCEPHIN) 1 g in sodium chloride 0.9 % 100 mL IVPB        1 g 200 mL/hr over 30 Minutes Intravenous Every 24 hours 08/18/22 1604     08/19/22 1125  Levofloxacin (LEVAQUIN) IVPB        over 60 Minutes  Continuous PRN 08/19/22 1125 08/19/22 1125   08/19/22 1015  levofloxacin (LEVAQUIN) IVPB 500 mg       Note to Pharmacy: On call to Radiology   500 mg 100 mL/hr over 60 Minutes Intravenous On call 08/19/22 0928 08/20/22 1015   08/19/22 0330  metroNIDAZOLE (FLAGYL) IVPB 500 mg        500 mg 100 mL/hr over 60 Minutes Intravenous Every 12 hours 08/18/22 1604     08/18/22 1400  cefTRIAXone (ROCEPHIN) 1 g in sodium chloride 0.9 % 100 mL IVPB        1 g 200 mL/hr over 30 Minutes Intravenous  Once 08/18/22 1359 08/18/22 1527   08/18/22 1400  metroNIDAZOLE (FLAGYL) IVPB 500 mg        500 mg 100 mL/hr over 60 Minutes Intravenous  Once 08/18/22 1359 08/18/22 1734       Assessment/Plan:  Febrile we will do CT a/p to evaluate for any potential abscess Family updated No need for surgical intervention I Spent 35 minutes in this encounter including personally reviewing imaging studies, coordinating her care, placing orders and performing appropriate documentation   Sterling Big, MD, FACS  08/21/2022

## 2022-08-21 NOTE — Progress Notes (Signed)
Pt more awake and oriented now after not receiving HS dose of MS Contin.  Pt states her abdomen is "just sore".  Continuous pulse ox order noted on chart review and initiated.  HR noted to be Afib with rate of 120-130's with spikes up to 150's.  Pt denies any chest pain or SHOB.  Will discuss with on call provider.   08/21/22 0002  Vitals  Temp 98.8 F (37.1 C)  BP 127/79  MAP (mmHg) 94  BP Location Right Arm  BP Method Automatic  Patient Position (if appropriate) Lying  Pulse Rate (!) 129  ECG Heart Rate (!) 120  Resp 16  MEWS COLOR  MEWS Score Color Yellow  Oxygen Therapy  SpO2 96 %  O2 Device Nasal Cannula  O2 Flow Rate (L/min) 2 L/min  Pain Assessment  Pain Scale 0-10  Pain Score 0  POSS Scale ([place]ero Opioid Sedation Scale)  POSS *See Group Information* 1-Acceptable,Awake and alert  MEWS Score  MEWS Temp 0  MEWS Systolic 0  MEWS Pulse 2  MEWS RR 0  MEWS LOC 0  MEWS Score 2   Hilton Sinclair BSN RN [facility] 08/21/2022, 12:13 AM

## 2022-08-21 NOTE — Progress Notes (Signed)
Pt assisted up to [facility] to void.  She voided only 1 drop but felt like she was going.  Bladder scan showed 79 and .  Pt denies any need to void or any discomfort.  She is much more oriented and conversant than at the beginning of the shift.  Daughter remains at bedside.  Will discuss UOP with on call provider.    08/21/22 0424  Vitals  Temp 98.8 F (37.1 C)  Temp Source Oral  BP 132/70  MAP (mmHg) 88  BP Location Right Arm  BP Method Automatic  Patient Position (if appropriate) Lying  Pulse Rate (!) 118  Pulse Rate Source Monitor  ECG Heart Rate (!) 124  Resp 16  MEWS COLOR  MEWS Score Color Yellow  Oxygen Therapy  SpO2 99 %  O2 Device Nasal Cannula  O2 Flow Rate (L/min) 2 L/min  Pain Assessment  Pain Scale 0-10  Pain Score 0  POSS Scale ([place] Opioid Sedation Scale)  POSS *See Group Information* 1-Acceptable,Awake and alert  MEWS Score  MEWS Temp 0  MEWS Systolic 0  MEWS Pulse 2  MEWS RR 0  MEWS LOC 0  MEWS Score 2   Hilton Sinclair BSN RN [facility] 08/21/2022, 5:19 AM

## 2022-08-21 NOTE — Progress Notes (Signed)
HR noted to be sustaining 120-130's.  Discussed with Manuela Schwartz NP and Metoprolol 5mg  IV x 1 given.  HR responded and is now mainly 105-115 with jumps up to 120's.   Hilton Sinclair BSN RN [facility]RN 08/21/2022, 1:17 AM

## 2022-08-21 NOTE — Progress Notes (Signed)
Progress Note   Patient: Allison Novak WCH:[id number] DOB: April 09, 1926 DOA: 08/18/2022     3 DOS: the patient was seen and examined on 08/21/2022     Subjective:  Patient seen and examined at bedside this morning Overnight patient had a temperature spike up to 101.3 with associated tachycardia. Urinalysis and urine culture as well as blood culture requested CT scan of the abdomen to evaluate for any abscesses have been ordered by surgeon Was found to be somewhat confused overnight and MS Contin dose was held and this morning patient appears more awake and alert. MS Contin has been discontinued as patient's pain level is much better than immediately postop Denies nausea vomiting chest pain or cough   Brief hospital course: Allison Novak is a 89 y.o. female with medical history significant of HFpEF, atrial fibrillation on Eliquis, previous PE, hypertension, hyperlipidemia, CAD, osteoporosis, hiatal hernia, CKD stage IIIa, CLL, who presents to the ED due to abdominal pain.   Allison Novak states that for the last 1 week prior to admission, Allison Novak has been experiencing right upper and lower quadrant abdominal pain that has been waxing and waning with mild to moderate severity up until this morning, when pain came back but was significantly severe.  Upon arrival to the emergency room CT abdomen was obtained that demonstrated cholelithiasis with severe gallbladder distention and wall thickening concerning for acute cholecystitis surgeon on board as well as IR. Patient underwent percutaneous cholecystostomy by IR on 08/19/2022 Cholecystostomy tube in place and draining appropriately       Physical Exam: Elderly female in no acute distress HENT:     Head: Normocephalic and atraumatic.  Cardiovascular:     Rate and Rhythm: Normal rate.  Tachycardic Pulmonary:     Effort: Pulmonary effort is normal. No respiratory distress.  Abdominal: Cholecystostomy tube in place    General: Bowel sounds are  normal. There is no distension.     Tenderness: There is abdominal tenderness in the right upper quadrant  Musculoskeletal:     Cervical back: Neck supple.  Neurological:     General: No focal deficit present.     Mental Status: Allison Novak is alert and oriented to person, place, and time. Mental status is at         Assessment and Plan:   Acute cholecystitis Patient is presenting with 1 week history of waxing and waning right upper quadrant and lower quadrant abdominal pain that has become suddenly worse today.  CT imaging with cholelithiasis with evidence of acute cholecystitis.   Plan - General surgery consulted; appreciate their recommendations - IPatient underwent percutaneous cholecystostomy by IR on 08/19/2022 Cholecystostomy tube in place and draining appropriately - Continue ceftriaxone and metronidazole Follow-up urinalysis and urine culture as well as blood culture requested CT scan of the abdomen to evaluate for any abscesses have been ordered by surgeon - Dilaudid as needed for pain control   Chronic diastolic CHF (congestive heart failure) Patient has a history of HFpEF currently being managed with torsemide.   On examination, Allison Novak is euvolemic.    Will monitor closely given risk for pulmonary edema. Plan - Daily weights - Strict in and out - Continuous pulse oximetry - Holding torsemide in the setting of AKI   Pleural effusion on right Patient has a history of chronic right-sided pleural effusion in the setting of CLL.  Pleural effusion appears stable at this time.  No indication for thoracentesis   Hypothyroidism - Continue home levothyroxine   AKI on  stage 3a chronic kidney disease (CKD) Renal function slightly higher than baseline Placed on IV fluid therapy for 24 hours Avoid nephrotoxic medications - Daily BMP   Chronic persistent atrial fibrillation - Resume Eliquis after surgical clearance - Continue home metoprolol and digoxin   Essential hypertension -  Continue home metoprolol   Advance Care Planning:   Code Status: DNR.     DVT prophylaxis-continue SCD, resume Eliquis after surgical clearance  Consults: General surgery, interventional radiology   Data Reviewed: I reviewed the patient's laboratory results today showing stabilization of renal function at 1.32   Family Communication: Discussed with patient's son and daughter-in-law present at bedside   Disposition: Status is: Inpatient Patient still continues to need inpatient management given surgical indication and consultation by interventional radiologist as well as surgeon     Time spent: 45 minutes   Vitals:   08/21/22 0424 08/21/22 0709 08/21/22 1003 08/21/22 1146  BP: 132/70 107/66 118/69 (!) 109/57  Pulse: (!) 118 (!) 113 (!) 105 (!) 104  Resp: Temp: 98.8 F (37.1 C) 98.5 F (36.9 C)  97.7 F (36.5 C)  TempSrc: Oral Oral  Oral  SpO2: 99% 97%  97%  Weight:      Height:         Author: Loyce Dys, MD 08/21/2022 1:20 PM  For on call review [email or web address].

## 2022-08-21 NOTE — Progress Notes (Signed)
       CROSS COVER NOTE  NAME: SOLMAYRA GILLAN MRN: [id number] DOB : [birth year]    HPI/Events of Note   Report:elevated heart rate Fever earlier in shift now resolved. BP stable Cardiac monitoring re initiated - a fib with rVE  On review of chart:    Assessment and  Interventions   Assessment:  Plan: Metoprolol 5 mg IV x1 Decrease continuous NS to 50 ml/h Mag added to am labs       Donnie Mesa NP [facility]

## 2022-08-22 DIAGNOSIS — K819 Cholecystitis, unspecified: Secondary | ICD-10-CM | POA: Diagnosis not present

## 2022-08-22 DIAGNOSIS — K810 Acute cholecystitis: Secondary | ICD-10-CM | POA: Diagnosis not present

## 2022-08-22 LAB — BASIC METABOLIC PANEL
Anion gap: 9 (ref 5–15)
BUN: 25 mg/dL — ABNORMAL HIGH (ref 8–23)
CO2: 25 mmol/L (ref 22–32)
Calcium: 8.5 mg/dL — ABNORMAL LOW (ref 8.9–10.3)
Chloride: 102 mmol/L (ref 98–111)
Creatinine, Ser: 1.08 mg/dL — ABNORMAL HIGH (ref 0.44–1.00)
GFR, Estimated: 48 mL/min — ABNORMAL LOW (ref >60)
Glucose, Bld: 88 mg/dL (ref 70–99)
Potassium: 3.8 mmol/L (ref 3.5–5.1)
Sodium: 136 mmol/L (ref 135–145)

## 2022-08-22 LAB — HEPATIC FUNCTION PANEL
ALT: 13 U/L (ref 0–44)
AST: 23 U/L (ref 15–41)
Albumin: 2.9 g/dL — ABNORMAL LOW (ref 3.5–5.0)
Alkaline Phosphatase: 112 U/L (ref 38–126)
Bilirubin, Direct: <0.1 mg/dL (ref 0.0–0.2)
Total Bilirubin: 0.7 mg/dL (ref 0.3–1.2)
Total Protein: 6 g/dL — ABNORMAL LOW (ref 6.5–8.1)

## 2022-08-22 LAB — CBC WITH DIFFERENTIAL/PLATELET
Abs Immature Granulocytes: 0.05 10*3/uL (ref 0.00–0.07)
Basophils Absolute: 0.1 10*3/uL (ref 0.0–0.1)
Basophils Relative: 1 %
Eosinophils Absolute: 0.4 10*3/uL (ref 0.0–0.5)
Eosinophils Relative: 4 %
HCT: 35.5 % — ABNORMAL LOW (ref 36.0–46.0)
Hemoglobin: 11.5 g/dL — ABNORMAL LOW (ref 12.0–15.0)
Immature Granulocytes: 1 %
Lymphocytes Relative: 8 %
Lymphs Abs: 0.7 10*3/uL (ref 0.7–4.0)
MCH: 32.9 pg (ref 26.0–34.0)
MCHC: 32.4 g/dL (ref 30.0–36.0)
MCV: 101.4 fL — ABNORMAL HIGH (ref 80.0–100.0)
Monocytes Absolute: 1 10*3/uL (ref 0.1–1.0)
Monocytes Relative: 11 %
Neutro Abs: 7.5 10*3/uL (ref 1.7–7.7)
Neutrophils Relative %: 75 %
Platelets: 251 10*3/uL (ref 150–400)
RBC: 3.5 MIL/uL — ABNORMAL LOW (ref 3.87–5.11)
RDW: 13.5 % (ref 11.5–15.5)
WBC: 9.8 10*3/uL (ref 4.0–10.5)
nRBC: 0 % (ref 0.0–0.2)

## 2022-08-22 LAB — MAGNESIUM: Magnesium: 2.3 mg/dL (ref 1.7–2.4)

## 2022-08-22 LAB — CULTURE, BLOOD (ROUTINE X 2)

## 2022-08-22 LAB — PROTIME-INR
INR: 1.2 (ref 0.8–1.2)
Prothrombin Time: 15.4 seconds — ABNORMAL HIGH (ref 11.4–15.2)

## 2022-08-22 NOTE — Progress Notes (Signed)
Progress Note   Patient: Allison Novak ZOX:[id number] DOB: 1926/02/29 DOA: 08/18/2022     4 DOS: the patient was seen and examined on 08/22/2022     Subjective:  Patient seen and examined at bedside this morning Urinalysis and blood cultures within normal limits Temperature have resolved CT scan of the abdomen to evaluate for any abscesses did not show any abscesses Tells me she feels better today Currently awaiting surgical clearance Denies nausea vomiting chest pain or cough   Brief hospital course: [name] is a 89 y.o. female with medical history significant of HFpEF, atrial fibrillation on Eliquis, previous PE, hypertension, hyperlipidemia, CAD, osteoporosis, hiatal hernia, CKD stage IIIa, CLL, who presents to the ED due to abdominal pain.   Allison Novak states that for the last 1 week prior to admission, she has been experiencing right upper and lower quadrant abdominal pain that has been waxing and waning with mild to moderate severity up until this morning, when pain came back but was significantly severe.  Upon arrival to the emergency room CT abdomen was obtained that demonstrated cholelithiasis with severe gallbladder distention and wall thickening concerning for acute cholecystitis surgeon on board as well as IR. Patient underwent percutaneous cholecystostomy by IR on 08/19/2022 Cholecystostomy tube in place and draining appropriately       Physical Exam: Elderly female in no acute distress HENT:     Head: Normocephalic and atraumatic.  Cardiovascular:     Rate and Rhythm: Normal rate.  Tachycardic Pulmonary:     Effort: Pulmonary effort is normal. No respiratory distress.  Abdominal: Cholecystostomy tube in place    General: Bowel sounds are normal. There is no distension.     Tenderness: There is abdominal tenderness in the right upper quadrant  Musculoskeletal:     Cervical back: Neck supple.  Neurological:     General: No focal deficit present.     Mental  Status: She is alert and oriented to person, place, and time. Mental status is at         Assessment and Plan:   Acute cholecystitis Patient is presenting with 1 week history of waxing and waning right upper quadrant and lower quadrant abdominal pain that has become suddenly worse today.  CT imaging with cholelithiasis with evidence of acute cholecystitis.   Plan - General surgery consulted; appreciate their recommendations - IPatient underwent percutaneous cholecystostomy by IR on 08/19/2022 Cholecystostomy tube in place and draining appropriately - Continue ceftriaxone and metronidazole Follow-up urinalysis and urine culture as well as blood culture requested CT scan of the abdomen to evaluate for any abscesses have been ordered by surgeon - Dilaudid as needed for pain control   Chronic diastolic CHF (congestive heart failure) Patient has a history of HFpEF currently being managed with torsemide.   On examination, she is euvolemic.    Will monitor closely given risk for pulmonary edema. Plan - Daily weights - Strict in and out - Continuous pulse oximetry - Holding torsemide in the setting of AKI   Pleural effusion on right Patient has a history of chronic right-sided pleural effusion in the setting of CLL.  Pleural effusion appears stable at this time.  No indication for thoracentesis   Hypothyroidism - Continue home levothyroxine   AKI on stage 3a chronic kidney disease (CKD) Renal function slightly higher than baseline Placed on IV fluid therapy for 24 hours Avoid nephrotoxic medications - Daily BMP   Chronic persistent atrial fibrillation - Resume Eliquis after surgical clearance -  Continue home metoprolol and digoxin   Essential hypertension - Continue home metoprolol   Advance Care Planning:   Code Status: DNR.     DVT prophylaxis-continue SCD, resume Eliquis after surgical clearance   Consults: General surgery, interventional radiology   Data Reviewed: I  reviewed the patient's laboratory results today showing stabilization of renal function at 1.32   Family Communication: Discussed with patient's son and daughter-in-law present at bedside   Disposition: Status is: Inpatient Patient still continues to need inpatient management given surgical indication and consultation by interventional radiologist as well as surgeon     Time spent: 45 minutes     Vitals:   08/22/22 0355 08/22/22 0500 08/22/22 0736 08/22/22 0849  BP: 125/83  (!) 101/59 112/61  Pulse: (!) 106  86 88  Resp: 16  20   Temp: 98.6 F (37 C)  97.6 F (36.4 C)   TempSrc:      SpO2: 100%     Weight:  63.1 kg    Height:        Author: Loyce Dys, MD 08/22/2022 2:31 PM  For on call review [email or web address].

## 2022-08-22 NOTE — Care Management Important Message (Signed)
Important Message  Patient Details  Name: Allison Novak MRN: [id number] Date of Birth: September 04, 1925   Medicare Important Message Given:  Yes     [name] 08/22/2022, 11:58 AM

## 2022-08-22 NOTE — Progress Notes (Signed)
[facility] SURGICAL PROGRESS NOTE (cpt [phone])  Hospital Day(s): 4.   Interval History: Patient seen and examined, no acute events or new complaints overnight. Patient reports she is starting to feel better. Most of her RUQ is localized to the drain. No fever, chills, nausea, emesis. Previously seen leukocytosis normalized; 9.8K. Hgb stable to 11.5. Renal function back towards baseline; sCr - 1.08; UO - 651 ccs. Percutaneous cholecystostomy tube in place; output 180 ccs; bilious. Cx from this growing enterobacter cloacae. She is on Zosyn. She is on CLD; tolerating. She is having bowel function.    Review of Systems:  Constitutional: denies fever, chills  HEENT: denies cough or congestion  Respiratory: denies any shortness of breath  Cardiovascular: denies chest pain or palpitations  Gastrointestinal: + abdominal pain (drain; RUQ); denied N/V Genitourinary: denies burning with urination or urinary frequency Musculoskeletal: denies pain, decreased motor or sensation  Vital signs in last 24 hours: [min-max] current  Temp:  [97.7 F (36.5 C)-98.6 F (37 C)] 98.6 F (37 C) (04/15 0355) Pulse Rate:  [96-106] 106 (04/15 0355) Resp:  [16-18] 16 (04/15 0355) BP: (109-144)/(57-83) 125/83 (04/15 0355) SpO2:  [97 %-100 %] 100 % (04/15 0355) Weight:  [63.1 kg] 63.1 kg (04/15 0500)     Height: 5\' 1"  (154.9 cm) Weight: 63.1 kg BMI (Calculated): 26.28   Intake/Output last 2 shifts:  04/14 0701 - 04/15 0700 In: 1096.8 [P.O.:860; IV Piggyback:211.8] Out: 831 [Urine:651; Drains:180]   Physical Exam:  Constitutional: alert, cooperative and no distress  HENT: normocephalic without obvious abnormality  Eyes: PERRL, EOM's grossly intact and symmetric  Respiratory: breathing non-labored at rest  Cardiovascular: regular rate and sinus rhythm  Gastrointestinal: soft, tenderness to RUQ at drain site; non-distended, no rebound/guarding. Percutaneous cholecystostomy tube in RUQ; output  bilious Musculoskeletal: no edema or wounds, motor and sensation grossly intact, NT    Labs:     Latest Ref Rng & Units 08/22/2022    6:16 AM 08/20/2022    5:23 AM 08/19/2022    4:56 AM  CBC  WBC 4.0 - 10.5 K/uL 9.8  12.3  12.8   Hemoglobin 12.0 - 15.0 g/dL 22.0  25.4  27.0   Hematocrit 36.0 - 46.0 % 35.5  36.5  33.1   Platelets 150 - 400 K/uL 251  210  184       Latest Ref Rng & Units 08/22/2022    6:16 AM 08/21/2022    5:45 AM 08/20/2022    8:50 AM  CMP  Glucose 70 - 99 mg/dL 88  82  76   BUN 8 - 23 mg/dL 25  27  21    Creatinine 0.44 - 1.00 mg/dL 6.23  7.62  8.31   Sodium 135 - 145 mmol/L 136  136  133   Potassium 3.5 - 5.1 mmol/L 3.8  4.5  4.6   Chloride 98 - 111 mmol/L 102  105  99   CO2 22 - 32 mmol/L 25  22  23    Calcium 8.9 - 10.3 mg/dL 8.5  8.3  8.2      Imaging studies: No new pertinent imaging studies   Assessment/Plan: (ICD-10's: K81.0) 89 y.o. female with likely acute cholecystitis, complicated by advanced age, multiple comorbid disease, and need for anticoagulation.   - Advance diet as tolerated; ordered FLD  - Continue IV Abx (Zosyn); Would recommend completion of 10 days total (IV+PO) from date of percutaneous cholecystostomy placement.  - Continue percutaneous cholecystostomy tube at home; will need to  continue for at least 6-8 weeks. Will provide Rx for saline flushes (5 ml daily)  - No emergent surgical intervention; sub-optimal candidate. Will reassess for interval cholecystectomy as outpatient  - Monitor abdominal examination; on-going bowel function - Pain control prn; antiemetics prn   - Mobilize as tolerated - Further management per primary service    - Discharge Planning: Nothing further from surgical perspective, okay to advance diet as tolerated. Will update follow up and DC instructions from surgical perspective. Please call with questions/concerns.    All of the above findings and recommendations were discussed with the patient, patient's family  (daughter at bedside), and the medical team, and all of patient's and family's questions were answered to their expressed satisfaction.  -- Lynden Oxford, PA-C [facility] 08/22/2022, 7:32 AM M-F: 7am - 4pm

## 2022-08-23 DIAGNOSIS — K819 Cholecystitis, unspecified: Secondary | ICD-10-CM | POA: Diagnosis not present

## 2022-08-23 LAB — CBC WITH DIFFERENTIAL/PLATELET
Abs Immature Granulocytes: 0.04 10*3/uL (ref 0.00–0.07)
Basophils Absolute: 0.1 10*3/uL (ref 0.0–0.1)
Basophils Relative: 1 %
Eosinophils Absolute: 0.5 10*3/uL (ref 0.0–0.5)
Eosinophils Relative: 6 %
HCT: 33.1 % — ABNORMAL LOW (ref 36.0–46.0)
Hemoglobin: 10.9 g/dL — ABNORMAL LOW (ref 12.0–15.0)
Immature Granulocytes: 1 %
Lymphocytes Relative: 18 %
Lymphs Abs: 1.4 10*3/uL (ref 0.7–4.0)
MCH: 33 pg (ref 26.0–34.0)
MCHC: 32.9 g/dL (ref 30.0–36.0)
MCV: 100.3 fL — ABNORMAL HIGH (ref 80.0–100.0)
Monocytes Absolute: 1 10*3/uL (ref 0.1–1.0)
Monocytes Relative: 14 %
Neutro Abs: 4.7 10*3/uL (ref 1.7–7.7)
Neutrophils Relative %: 60 %
Platelets: 244 10*3/uL (ref 150–400)
RBC: 3.3 MIL/uL — ABNORMAL LOW (ref 3.87–5.11)
RDW: 13.1 % (ref 11.5–15.5)
WBC: 7.7 10*3/uL (ref 4.0–10.5)
nRBC: 0 % (ref 0.0–0.2)

## 2022-08-23 LAB — BASIC METABOLIC PANEL
Anion gap: 8 (ref 5–15)
BUN: 21 mg/dL (ref 8–23)
CO2: 26 mmol/L (ref 22–32)
Calcium: 8 mg/dL — ABNORMAL LOW (ref 8.9–10.3)
Chloride: 104 mmol/L (ref 98–111)
Creatinine, Ser: 0.96 mg/dL (ref 0.44–1.00)
GFR, Estimated: 55 mL/min — ABNORMAL LOW (ref >60)
Glucose, Bld: 82 mg/dL (ref 70–99)
Potassium: 3.5 mmol/L (ref 3.5–5.1)
Sodium: 138 mmol/L (ref 135–145)

## 2022-08-23 LAB — CULTURE, BLOOD (ROUTINE X 2): Culture: NO GROWTH

## 2022-08-23 LAB — GLUCOSE, CAPILLARY: Glucose-Capillary: 76 mg/dL (ref 70–99)

## 2022-08-23 MED ORDER — METRONIDAZOLE 500 MG PO TABS: 500.0000 mg | ORAL_TABLET | Freq: Two times a day (BID) | ORAL | 0 refills | Status: AC

## 2022-08-23 MED ORDER — METRONIDAZOLE 500 MG PO TABS
500.0000 mg | ORAL_TABLET | Freq: Two times a day (BID) | ORAL | Status: DC
  Administered 2022-08-23: 500 mg via ORAL
  Filled 2022-08-23: qty 1

## 2022-08-23 MED ORDER — METRONIDAZOLE 500 MG PO TABS: 500.0000 mg | ORAL_TABLET | Freq: Two times a day (BID) | ORAL | 0 refills | Status: DC

## 2022-08-23 MED ORDER — LEVOFLOXACIN 250 MG PO TABS: 250.0000 mg | ORAL_TABLET | Freq: Every day | ORAL | 0 refills | Status: AC

## 2022-08-23 MED ORDER — CLOTRIMAZOLE 10 MG MT TROC
10.0000 mg | Freq: Every day | OROMUCOSAL | Status: DC
  Filled 2022-08-23 (×2): qty 1

## 2022-08-23 MED ORDER — LEVOFLOXACIN 250 MG PO TABS: 250.0000 mg | ORAL_TABLET | Freq: Every day | ORAL | 0 refills | Status: DC

## 2022-08-23 MED ORDER — ACETAMINOPHEN 325 MG PO TABS: 650.0000 mg | ORAL_TABLET | Freq: Four times a day (QID) | ORAL | 0 refills | Status: AC | PRN

## 2022-08-23 MED ORDER — LEVOFLOXACIN 250 MG PO TABS: 250.0000 mg | ORAL_TABLET | Freq: Every day | ORAL | Status: DC

## 2022-08-23 MED ORDER — APIXABAN 2.5 MG PO TABS
2.5000 mg | ORAL_TABLET | Freq: Two times a day (BID) | ORAL | Status: DC
  Administered 2022-08-23: 2.5 mg via ORAL
  Filled 2022-08-23: qty 1

## 2022-08-23 MED ORDER — LEVOFLOXACIN 500 MG PO TABS
500.0000 mg | ORAL_TABLET | Freq: Once | ORAL | Status: AC
  Administered 2022-08-23: 500 mg via ORAL
  Filled 2022-08-23: qty 1

## 2022-08-23 NOTE — Discharge Summary (Signed)
Physician Discharge Summary   Patient: Allison Novak MRN: [id number] DOB: 1925-09-20  Admit date:     08/18/2022  Discharge date: 08/23/22  Discharge Physician: Loyce Dys   PCP: Lynnea Ferrier, MD     Discharge Diagnoses: Acute cholecystitis Chronic diastolic CHF (congestive heart failure) Pleural effusion on right Hypothyroidism AKI on stage 3a chronic kidney disease (CKD) Chronic persistent atrial fibrillation Essential hypertension   Hospital Course: Allison Novak is a 89 y.o. female with medical history significant of HFpEF, atrial fibrillation on Eliquis, previous PE, hypertension, hyperlipidemia, CAD, osteoporosis, hiatal hernia, CKD stage IIIa, CLL, who presents to the ED due to abdominal pain. Allison Novak presented with 1 week history of right upper and lower quadrant abdominal pain that has been waxing and waning with mild to moderate severity up until the day of presentation, when pain came back but was significantly severe.  Upon arrival to the emergency room CT abdomen was obtained that demonstrated cholelithiasis with severe gallbladder distention and wall thickening concerning for acute cholecystitis.  Surgeon and interventional radiologist were consulted.  Patient underwent percutaneous cholecystostomy by IR on 08/19/2022 Cholecystostomy tube in place and draining appropriately.  Has been cleared for discharge today by surgeon to complete antibiotic course and to follow-up with surgeon in 1 to 2 weeks.  Cholecystostomy tube care instructions:  monitor output from tube site daily Flush once daily with 5 mL of normal saline and change dressing as needed.  Assessment and Plan: Consultants: Interventional radiology, general surgery Procedures performed: As mentioned above Disposition: Skilled nursing facility Diet recommendation:  Discharge Diet Orders (From admission, onward)     Start     Ordered   08/23/22 0000  Diet - low sodium heart healthy         08/23/22 1209           Cardiac diet DISCHARGE MEDICATION: Allergies as of 08/23/2022       Reactions   Keflex [cephalexin] Other (See Comments)   Pt cant remember exact reaction   Macrodantin [nitrofurantoin Macrocrystal] Other (See Comments)        Medication List     STOP taking these medications    allopurinol 100 MG tablet Commonly known as: ZYLOPRIM   Azelastine-Fluticasone 137-50 MCG/ACT Susp   beclomethasone 40 MCG/ACT inhaler Commonly known as: QVAR   benzonatate 200 MG capsule Commonly known as: TESSALON   predniSONE 20 MG tablet Commonly known as: DELTASONE   torsemide 20 MG tablet Commonly known as: DEMADEX       TAKE these medications    acetaminophen 325 MG tablet Commonly known as: TYLENOL Take 2 tablets (650 mg total) by mouth every 6 (six) hours as needed for mild pain (or Fever >/= 101).   albuterol 1.25 MG/3ML nebulizer solution Commonly known as: ACCUNEB Take 1 ampule by nebulization every 6 (six) hours as needed for wheezing.   albuterol 108 (90 Base) MCG/ACT inhaler Commonly known as: VENTOLIN HFA Inhale 2 puffs into the lungs every 6 (six) hours as needed for wheezing or shortness of breath.   atorvastatin 40 MG tablet Commonly known as: LIPITOR Take 40 mg by mouth daily.   calcium carbonate 1500 (600 Ca) MG Tabs tablet Commonly known as: OSCAL Take 1 tablet by mouth in the morning and at bedtime.   Cholecalciferol 50 MCG (2000 UT) Caps Take 2,000 Units by mouth daily.   digoxin 0.125 MG tablet Commonly known as: LANOXIN Take 0.0625 mg by mouth daily.  Eliquis 2.5 MG Tabs tablet Generic drug: apixaban Take 2.5 mg by mouth 2 (two) times daily.   esomeprazole 40 MG capsule Commonly known as: NEXIUM Take 40 mg by mouth daily.   levofloxacin 250 MG tablet Commonly known as: LEVAQUIN Take 1 tablet (250 mg total) by mouth daily for 6 days. Start taking on: August 24, 2022   levothyroxine 25 MCG tablet Commonly  known as: SYNTHROID Take 25 mcg by mouth daily.   metoprolol succinate 50 MG 24 hr tablet Commonly known as: TOPROL-XL Take 1 tablet (50 mg total) by mouth daily. Take with or immediately following a meal.   metroNIDAZOLE 500 MG tablet Commonly known as: FLAGYL Take 1 tablet (500 mg total) by mouth 2 (two) times daily for 6 days.   Multi-Vitamin tablet Take 1 tablet by mouth daily.   polyvinyl alcohol 1.4 % ophthalmic solution Commonly known as: LIQUIFILM TEARS Place 2 drops into the left eye every 8 (eight) hours as needed for dry eyes.   potassium chloride 10 MEQ tablet Commonly known as: KLOR-CON Take 10 mEq by mouth daily as needed.               Discharge Care Instructions  (From admission, onward)           Start     Ordered   08/23/22 0000  Change dressing (specify)       Comments: Dressing change: AS NEEDED   08/23/22 1209            Contact information for follow-up providers     Campbell Lerner, MD. Schedule an appointment as soon as possible for a visit in 3 week(s).   Specialty: General Surgery Why: Hospital follow up in 2-3 weeks; cholecystitis Contact information: [address] [place] [address] [phone]              Contact information for after-discharge care     Destination     [facility] Preferred SNF .   Service: Skilled Nursing Contact information: [address] [place] [address] [phone]                    Discharge Exam: Ceasar Mons Weights   08/19/22 0500 08/19/22 1052 08/22/22 0500  Weight: 62.8 kg 62.8 kg 63.1 kg   Elderly female in no acute distress HENT:     Head: Normocephalic and atraumatic.  Cardiovascular:     Rate and Rhythm: Normal rate.  Pulmonary:     Effort: Pulmonary effort is normal. No respiratory distress.  Abdominal: Cholecystostomy tube in place    General: Bowel  sounds are normal. There is no distension.  Musculoskeletal:     Cervical back: Neck supple.  Neurological:     General: No focal deficit present.     Mental Status: She is alert and oriented to person, place, and time. Mental status is at  baseline  Condition at discharge: good  Discharge time spent: greater than 30 minutes.  Signed: Loyce Dys, MD [facility] 08/23/2022

## 2022-08-23 NOTE — [place] FL2 (Signed)
[place] MEDICAID [place]2 LEVEL OF CARE FORM     IDENTIFICATION  Patient Name: [name] Birthdate: 04-12-1926 Sex: female Admission Date (Current Location): 08/18/2022  [facility] and [place] Number:  Chiropodist and Address:         Provider Number: [id number]  Attending Physician Name and Address:  Loyce Dys, MD  Relative Name and Phone Number:       Current Level of Care: Hospital Recommended Level of Care: Skilled Nursing Facility Prior Approval Number:    Date Approved/Denied:   PASRR Number: [id number]  Discharge Plan: SNF    Current Diagnoses: Patient Active Problem List   Diagnosis Date Noted   Cholecystitis 08/18/2022   Gout 06/01/2022   Sepsis due to pneumonia 04/30/2022   Acute bronchitis due to respiratory syncytial virus (RSV) 04/30/2022   Aspiration pneumonia 04/30/2022   CLL (chronic lymphocytic leukemia) 01/18/2022   Iron deficiency anemia 07/21/2021   Moderate pulmonary hypertension 07/20/2021   Sepsis 07/19/2021   Thrombocytopenia 07/19/2021   Acute urinary retention 07/18/2021   History of pulmonary embolism 07/18/2021   Hypothermia 07/18/2021   Pleural effusion on right 07/18/2021   Chronic diastolic CHF (congestive heart failure) 07/17/2021   HLD (hyperlipidemia) 07/17/2021   Pulmonary embolism 07/17/2021   Stage 3a chronic kidney disease (CKD) 07/17/2021   Hypothyroidism 07/17/2021   Acute respiratory failure with hypoxia 07/17/2021   Acquired thrombophilia 06/22/2021   Anemia of chronic disease 06/07/2021   Permanent atrial fibrillation 06/07/2021   Hyponatremia 06/07/2021   Acute on chronic combined systolic and diastolic CHF (congestive heart failure) 06/07/2021   Atrial fibrillation, chronic 08/04/2020   Acute exacerbation of CHF (congestive heart failure) 08/04/2020   Shortness of breath 08/03/2020   Hypokalemia    Palpitations    Hyperlipidemia 02/16/2020   Atrial fibrillation with RVR 02/16/2020   Hypoxia  02/16/2020   Acute CHF (congestive heart failure) 02/16/2020   Chronic anticoagulation 02/16/2020   Senile purpura 11/21/2019   Nodule of upper lobe of left lung 10/30/2017   History of TIA (transient ischemic attack) 02/23/2017   Carotid artery disease 02/23/2017   TIA (transient ischemic attack) 02/18/2017   Hiatal hernia with GERD without esophagitis 02/18/2017   History of nonmelanoma skin cancer 08/10/2015   Gallstones 06/30/2015   Chest pain 06/23/2015   Essential hypertension 06/16/2015   Aortic atherosclerosis 06/16/2015   Diverticulosis of large intestine without hemorrhage 06/16/2015   Age-related osteoporosis without current pathological fracture 10/22/2013   Chronic ankle pain 06/28/2011    Orientation RESPIRATION BLADDER Height & Weight     Time, Self, Situation, Place  Normal Continent Weight: 63.1 kg Height:   (154.9 cm)  BEHAVIORAL SYMPTOMS/MOOD NEUROLOGICAL BOWEL NUTRITION STATUS      Continent Diet (full liquid.  will advance prior to discharge)  AMBULATORY STATUS COMMUNICATION OF NEEDS Skin   Extensive Assist Verbally Surgical wounds (bili tube)                       Personal Care Assistance Level of Assistance              Functional Limitations Info             SPECIAL CARE FACTORS FREQUENCY  PT (By licensed PT), OT (By licensed OT)                    Contractures Contractures Info: Not present    Additional Factors Info  Code Status,  Allergies Code Status Info: DNR Allergies Info: keflex, macrodantin           Current Medications (08/23/2022):  This is the current hospital active medication list Current Facility-Administered Medications  Medication Dose Route Frequency Provider Last Rate Last Admin   acetaminophen (TYLENOL) tablet 650 mg  650 mg Oral Q6H PRN Verdene Lennert, MD   650 mg at 08/22/22 0441   Or   acetaminophen (TYLENOL) suppository 650 mg  650 mg Rectal Q6H PRN Verdene Lennert, MD       albuterol  (PROVENTIL) (2.5 MG/3ML) 0.083% nebulizer solution 3 mL  3 mL Nebulization Q6H PRN Verdene Lennert, MD       digoxin (LANOXIN) tablet 0.0625 mg  0.0625 mg Oral Daily Verdene Lennert, MD   0.0625 mg at 08/23/22 0811   HYDROmorphone (DILAUDID) injection 1 mg  1 mg Intravenous Q3H PRN Rosezetta Schlatter T, MD   1 mg at 08/20/22 0527   levothyroxine (SYNTHROID) tablet 25 mcg  25 mcg Oral Q0600 Verdene Lennert, MD   25 mcg at 08/23/22 0546   metoprolol succinate (TOPROL-XL) 24 hr tablet 50 mg  50 mg Oral Daily Verdene Lennert, MD   50 mg at 08/23/22 0811   ondansetron (ZOFRAN) tablet 4 mg  4 mg Oral Q6H PRN Verdene Lennert, MD       Or   ondansetron (ZOFRAN) injection 4 mg  4 mg Intravenous Q6H PRN Verdene Lennert, MD       piperacillin-tazobactam (ZOSYN) IVPB 3.375 g  3.375 g Intravenous Q8H Rosezetta Schlatter T, MD 12.5 mL/hr at 08/23/22 0543 3.375 g at 08/23/22 0543   sodium chloride flush (NS) 0.9 % injection 3 mL  3 mL Intravenous Q12H Verdene Lennert, MD   3 mL at 08/23/22 0814   sodium chloride flush (NS) 0.9 % injection 5 mL  5 mL Intracatheter Q8H Berdine Dance, MD   5 mL at 08/23/22 0542   Facility-Administered Medications Ordered in Other Encounters  Medication Dose Route Frequency Provider Last Rate Last Admin   epoetin alfa-epbx (RETACRIT) injection 40,000 Units  40,000 Units Subcutaneous Q21 days Creig Hines, MD   40,000 Units at 09/06/21 1425     Discharge Medications: Please see discharge summary for a list of discharge medications.  Relevant Imaging Results:  Relevant Lab Results:   Additional Information :[id number]  Chapman Fitch, RN

## 2022-08-23 NOTE — TOC Progression Note (Signed)
Transition of Care [facility]) - Progression Note    Patient Details  Name: Allison Novak MRN: [id number] Date of Birth: 04/17/1926  [facility]) CM/SW Contact  Chapman Fitch, RN Phone Number: 08/23/2022, 9:34 AM  Clinical Narrative:    Therapy recommending SNF Daughter Eber Jones at bedside Daughter and patient both in agreement Per patient top choice is [employer], retail sent for [employer] search initiated    Expected Discharge Plan: Assisted Living Barriers to Discharge: Continued Medical Work up  Expected Discharge Plan and Services     Post Acute Care Choice: NA Living arrangements for the past 2 months: Assisted Living Facility                                       Social Determinants of Health (SDOH) Interventions SDOH Screenings   Food Insecurity: No Food Insecurity (08/18/2022)  Housing: Low Risk  (08/18/2022)  Transportation Needs: No Transportation Needs (08/18/2022)  Utilities: Not At Risk (08/18/2022)  Tobacco Use: Low Risk  (08/19/2022)    Readmission Risk Interventions    08/19/2022   11:29 AM  Readmission Risk Prevention Plan  Transportation Screening Complete  PCP or Specialist Appt within 3-5 Days Complete  Social Work Consult for Recovery Care Planning/Counseling Complete  Palliative Care Screening Not Applicable  Medication Review Oceanographer) Complete

## 2022-08-23 NOTE — Progress Notes (Signed)
Physical Therapy Treatment Patient Details Name: Allison Novak MRN: [id number] DOB: 1926/02/06 Today's Date: 08/23/2022   History of Present Illness Pt is a 89 y.o. female with medical history significant of HFpEF, atrial fibrillation on Eliquis, previous PE, hypertension, hyperlipidemia, CAD, osteoporosis, hiatal hernia, CKD stage IIIa, CLL, who presents to the ED due to abdominal pain. MD assessment includes: Acute cholecystitis with pt now s/p percutaneous cholecystostomy, pleural effusion on right, AKI, and hypothyroidism.    PT Comments    Patient was agreeable to PT. Patient is making progress towards PT goals. Patient was able to walk into the hallway with rolling walker. Standing activity tolerance is limited by fatigue. Physical assistance is still required for bed mobility and transfers. Recommend to continue PT to maximize independence and decrease caregiver burden.    Recommendations for follow up therapy are one component of a multi-disciplinary discharge planning process, led by the attending physician.  Recommendations may be updated based on patient status, additional functional criteria and insurance authorization.  Follow Up Recommendations  Can patient physically be transported by private vehicle: No    Assistance Recommended at Discharge Frequent or constant Supervision/Assistance  Patient can return home with the following Two people to help with walking and/or transfers;A lot of help with bathing/dressing/bathroom;Assist for transportation   Equipment Recommendations  None recommended by PT    Recommendations for Other Services       Precautions / Restrictions Precautions Precautions: Fall Restrictions Weight Bearing Restrictions: No     Mobility  Bed Mobility Overal bed mobility: Needs Assistance Bed Mobility: Supine to Sit, Sit to Supine     Supine to sit: Min assist Sit to supine: Min assist   General bed mobility comments: assistance for LE or  trunk support. increased time and effort provided    Transfers Overall transfer level: Needs assistance Equipment used: Rolling walker (2 wheels) Transfers: Sit to/from Stand Sit to Stand: Min assist           General transfer comment: several standing bouts perforemd. verbal cues for technique. lifting assistance needed for standing    Ambulation/Gait Ambulation/Gait assistance: Min guard Gait Distance (Feet): 30 Feet Assistive device: Rolling walker (2 wheels) Gait Pattern/deviations: Step-through pattern, Trunk flexed, Narrow base of support Gait velocity: decreased     General Gait Details: patient ambulated to the hallway with Min gaurd assistance for safety. activity tolerance is limited by fatigue. Sp02 in the 90's on room air. heart rate in the 110's.   Stairs             Wheelchair Mobility    Modified Rankin (Stroke Patients Only)       Balance Overall balance assessment: Needs assistance Sitting-balance support: Feet supported Sitting balance-Leahy Scale: Good     Standing balance support: Bilateral upper extremity supported Standing balance-Leahy Scale: Fair                              Cognition Arousal/Alertness: Awake/alert Behavior During Therapy: WFL for tasks assessed/performed Overall Cognitive Status: Within Functional Limits for tasks assessed                                          Exercises      General Comments General comments (skin integrity, edema, etc.): physical assistance required for peri-care following urination and small bowel movement. patient  requesting for old bandaid on right mid tibia area be removed. new one was placed per patient/family request.      Pertinent Vitals/Pain Pain Assessment Pain Assessment: No/denies pain    Home Living                          Prior Function            PT Goals (current goals can now be found in the care plan section) Acute Rehab  PT Goals Patient Stated Goal: to walk PT Goal Formulation: With patient Time For Goal Achievement: 09/02/22 Potential to Achieve Goals: Good Progress towards PT goals: Progressing toward goals    Frequency    Min 2X/week      PT Plan Current plan remains appropriate    Co-evaluation              AM-PAC PT "6 Clicks" Mobility   Outcome Measure  Help needed turning from your back to your side while in a flat bed without using bedrails?: A Lot Help needed moving from lying on your back to sitting on the side of a flat bed without using bedrails?: A Lot Help needed moving to and from a bed to a chair (including a wheelchair)?: A Lot Help needed standing up from a chair using your arms (e.g., wheelchair or bedside chair)?: A Little Help needed to walk in hospital room?: A Little Help needed climbing 3-5 steps with a railing? : A Lot 6 Click Score: 14    End of Session   Activity Tolerance: Patient tolerated treatment well;Patient limited by fatigue Patient left: in bed;with call bell/phone within reach;with bed alarm set;with family/visitor present Nurse Communication: Mobility status PT Visit Diagnosis: Unsteadiness on feet (R26.81);Difficulty in walking, not elsewhere classified (R26.2);Muscle weakness (generalized) (M62.81)     Time: 1610-9604 PT Time Calculation (min) (ACUTE ONLY): 31 min  Charges:  $Therapeutic Activity: 23-37 mins                     Donna Bernard, PT, MPT    Allison Novak 08/23/2022, 11:42 AM

## 2022-08-23 NOTE — TOC Transition Note (Signed)
Transition of Care [facility]) - CM/SW Discharge Note   Patient Details  Name: Allison Novak MRN: [id number] Date of Birth: 18-Mar-1926  Transition of Care [facility]) CM/SW Contact:  Chapman Fitch, RN Phone Number: 08/23/2022, 1:28 PM   Clinical Narrative:      Met with daughter carolyn and patient at bedside. Bed offers presented.  Patient selects [facility] Accepted in [place] and notified Tiffany at [facility]  Patient to discharge today  Patient will DC to: [facility] Anticipated DC date: 08/23/22  Family notified Investment banker, operational by: Wendie Simmer  Per MD patient ready for DC to . RN, patient, patient's family, and facility notified of DC. Discharge Summary sent to facility. RN given number for report. DC packet and signed DNR on chart. Ambulance transport requested for patient.  TOC signing off.  Bevelyn Ngo [facility] [phone]    Barriers to Discharge: Continued Medical Work up   Patient Goals and CMS Choice      Discharge Placement                         Discharge Plan and Services Additional resources added to the After Visit Summary for       Post Acute Care Choice: NA                               Social Determinants of Health (SDOH) Interventions SDOH Screenings   Food Insecurity: No Food Insecurity (08/18/2022)  Housing: Low Risk  (08/18/2022)  Transportation Needs: No Transportation Needs (08/18/2022)  Utilities: Not At Risk (08/18/2022)  Tobacco Use: Low Risk  (08/19/2022)     Readmission Risk Interventions    08/19/2022   11:29 AM  Readmission Risk Prevention Plan  Transportation Screening Complete  PCP or Specialist Appt within 3-5 Days Complete  Social Work Consult for Recovery Care Planning/Counseling Complete  Palliative Care Screening Not Applicable  Medication Review Oceanographer) Complete

## 2022-08-24 LAB — AEROBIC/ANAEROBIC CULTURE W GRAM STAIN (SURGICAL/DEEP WOUND): Gram Stain: NONE SEEN

## 2022-08-24 LAB — CULTURE, BLOOD (ROUTINE X 2): Special Requests: ADEQUATE

## 2022-08-25 LAB — CULTURE, BLOOD (ROUTINE X 2): Culture: NO GROWTH

## 2022-08-26 LAB — CULTURE, BLOOD (ROUTINE X 2)

## 2022-09-13 ENCOUNTER — Other Ambulatory Visit: Payer: Self-pay | Admitting: Surgery

## 2022-09-13 ENCOUNTER — Ambulatory Visit (INDEPENDENT_AMBULATORY_CARE_PROVIDER_SITE_OTHER): Payer: Medicare Other | Admitting: Surgery

## 2022-09-13 ENCOUNTER — Encounter: Payer: Self-pay | Admitting: Surgery

## 2022-09-13 VITALS — BP 116/72 | HR 92 | Temp 98.0°F | Ht 61.0 in | Wt 130.0 lb

## 2022-09-13 DIAGNOSIS — K810 Acute cholecystitis: Secondary | ICD-10-CM

## 2022-09-13 DIAGNOSIS — Z434 Encounter for attention to other artificial openings of digestive tract: Secondary | ICD-10-CM | POA: Diagnosis not present

## 2022-09-13 NOTE — Patient Instructions (Addendum)
We have called in a prescription for the flush syringes to [facility] in [place].  Please call Interventional Radiology to schedule your follow up tube check. Their number is [phone]. They will monitor your drain tube and can determine when a capping trial might be attempted.    Follow up here in 1 month.   Cholecystitis Cholecystitis is irritation and swelling (inflammation) of the gallbladder. The gallbladder: Is an organ that is shaped like a pear. Is under the liver on the right side of the body. Stores bile. Bile helps the body break down (digest) the fats in food. This condition can occur all of a sudden. It needs to be treated. What are the causes? This condition may be caused by stones or lumps that form in the gallbladder (gallstones). Gallstones can block the tube (duct) that carries bile out of your gallbladder. Other causes include: Damage to the gallbladder due to less blood flow. Germs in the bile ducts. Scars, kinks, or adhesions in the bile ducts. Abnormal growths (tumors) in the liver, pancreas, or gallbladder. What increases the risk? You are more likely to develop this condition if: You are female and between the ages of 28-62. You take birth control pills. You use estrogen. You take certain medicines that make you more likely to develop gallstones. You are overweight (obese). You have a very bad reaction to an infection (sepsis). You have been hospitalized due to a serious condition, such as a burn or illness. You have not eaten or drank for a long time. What are the signs or symptoms? Symptoms of this condition include: Pain in the upper right part of the belly (abdomen). A lump over the gallbladder. Bloating in the belly. Feeling sick to your stomach (nauseous). Vomiting. Fever. Chills. How is this treated? This condition may be treated with: Medicines to treat pain. Giving fluids through an IV tube. Not eating or drinking (fasting). Antibiotic  medicines. Surgery to take out your gallbladder. Gallbladder drainage. Follow these instructions at home: Medicines  Take over-the-counter and prescription medicines only as told by your doctor. If you were prescribed an antibiotic medicine, take it as told by your doctor. Do not stop taking it even if you start to feel better. General instructions Follow instructions from your doctor about what to eat or drink. Do not eat or drink anything that makes you sick again. Do not smoke or use any products that contain nicotine or tobacco. If you need help quitting, ask your doctor. Keep all follow-up visits. Contact a doctor if: You have pain and your medicine does not help. You have a fever. Get help right away if: Your pain moves to: Another part of your belly. Your back. Your symptoms do not go away. You have new symptoms. These symptoms may be an emergency. Get help right away. Call 911. Do not wait to see if the symptoms will go away. Do not drive yourself to the hospital. Summary This condition may be caused by stones or lumps that form in the gallbladder (gallstones). A common symptom is pain in your belly. This condition may be treated with surgery to take out your gallbladder. Follow instructions from your doctor about what to eat or drink. This information is not intended to replace advice given to you by your health care provider. Make sure you discuss any questions you have with your health care provider. Document Revised: 10/27/2020 Document Reviewed: 10/27/2020 Elsevier Patient Education  2023 [employer].

## 2022-09-13 NOTE — Progress Notes (Signed)
Surgical Clinic Progress/Follow-up Note   HPI:  89 y.o. Female presents to clinic for percutaneous cholecystostomy placement follow-up  3 weeks follow the last evaluation. Patient reports  improvement/resolution of prior issues and has been tolerating regular diet with +flatus and normal BM's, denies N/V, fever/chills, CP, or SOB.  Culture showed a few Enterobacter cloacae.  She reportedly completed her antibiotic course at her residence. She appears to be tolerating the drain placement fine.  Has not had follow-up with radiology yet.  Review of Systems:  Constitutional: denies fever/chills  ENT: denies sore throat, hearing problems  Respiratory: denies shortness of breath, wheezing  Cardiovascular: denies chest pain, palpitations  Gastrointestinal: denies abdominal pain, N/V, or diarrhea/and bowel function as per interval history Skin: Denies any other rashes or skin discolorations except post-surgical wounds  Vital Signs:  There were no vitals taken for this visit.   Physical Exam:  Constitutional:  -- Normal body habitus  -- Awake, alert, and oriented x3  Pulmonary:  -- No crackles -- Equal breath sounds bilaterally -- Breathing non-labored at rest Cardiovascular:  -- S1, S2 present  -- No pericardial rubs  Gastrointestinal:  -- Soft and non-distended, non-tender/with mild peri-drain tenderness to palpation, no guarding/rebound tenderness.  No abdominal masses appreciated, pulsatile or otherwise   Musculoskeletal / Integumentary:  -- Wounds or skin discoloration: None appreciated except post-surgical incisions -- Extremities: B/L UE and LE FROM, hands and feet warm, no edema   Laboratory studies: Few Enterobacter noted on bile culture.  Imaging: No new pertinent imaging available for review   Assessment:  89 y.o. yo Female with a problem list including...  Patient Active Problem List   Diagnosis Date Noted   Cholecystitis 08/18/2022   Gout 06/01/2022   Sepsis due to  pneumonia (HCC) 04/30/2022   Acute bronchitis due to respiratory syncytial virus (RSV) 04/30/2022   Aspiration pneumonia (HCC) 04/30/2022   CLL (chronic lymphocytic leukemia) (HCC) 01/18/2022   Iron deficiency anemia 07/21/2021   Moderate pulmonary hypertension (HCC) 07/20/2021   Sepsis (HCC) 07/19/2021   Thrombocytopenia (HCC) 07/19/2021   Acute urinary retention 07/18/2021   History of pulmonary embolism 07/18/2021   Hypothermia 07/18/2021   Pleural effusion on right 07/18/2021   Chronic diastolic CHF (congestive heart failure) (HCC) 07/17/2021   HLD (hyperlipidemia) 07/17/2021   Pulmonary embolism (HCC) 07/17/2021   Stage 3a chronic kidney disease (CKD) (HCC) 07/17/2021   Hypothyroidism 07/17/2021   Acute respiratory failure with hypoxia (HCC) 07/17/2021   Acquired thrombophilia (HCC) 06/22/2021   Anemia of chronic disease 06/07/2021   Permanent atrial fibrillation (HCC) 06/07/2021   Hyponatremia 06/07/2021   Acute on chronic combined systolic and diastolic CHF (congestive heart failure) (HCC) 06/07/2021   Atrial fibrillation, chronic (HCC) 08/04/2020   Acute exacerbation of CHF (congestive heart failure) (HCC) 08/04/2020   Shortness of breath 08/03/2020   Hypokalemia    Palpitations    Hyperlipidemia 02/16/2020   Atrial fibrillation with RVR (HCC) 02/16/2020   Hypoxia 02/16/2020   Acute CHF (congestive heart failure) (HCC) 02/16/2020   Chronic anticoagulation 02/16/2020   Senile purpura (HCC) 11/21/2019   Nodule of upper lobe of left lung 10/30/2017   History of TIA (transient ischemic attack) 02/23/2017   Carotid artery disease (HCC) 02/23/2017   TIA (transient ischemic attack) 02/18/2017   Hiatal hernia with GERD without esophagitis 02/18/2017   History of nonmelanoma skin cancer 08/10/2015   Gallstones 06/30/2015   Chest pain 06/23/2015   Essential hypertension 06/16/2015   Aortic atherosclerosis (HCC) 06/16/2015  Diverticulosis of large intestine without  hemorrhage 06/16/2015   Age-related osteoporosis without current pathological fracture 10/22/2013   Chronic ankle pain 06/28/2011    presents to clinic for follow-up evaluation of the ostomy, progressing well.  Plan:              - return to clinic after follow-up imaging with IR, or 1 month or as needed, instructed to call office if any questions or concerns Cholecystostomy treatment algorithm.  Typical management of cholecystostomy tube:  - Arrange for f/u with IR (preferably at the IR drain clinic in [place] - [phone]; or at the hospital) for cholangiogram in 4-5 weeks.    Currently not felt to be operative candidate, further tolerance of cholecystostomy tube may be a factor in her decision, considering her age we would anticipate deferring this and run through the nonoperative algorithm. - Usually at the time of this initial appointment, discussion involving IR with the surgeon regarding if the pt is an operative candidate.  If so, even in the setting of patent cystic and common bile ducts, we will leave the chole tube until the operation (to avoid a repeat acute episode and further delay the surgery).  - If the pt is NOT an operative candidate, but the cholangiogram demonstrates patency of the cystic and common bile ducts, we will cap the chole tube and bring the pt back in 1 week for repeat cholangiogram.    - If the pt fails their trial of internalization, we will declare it a permanent tube, and set the pt up for q 6-8 week exchanges.  - If the pt passes the trial of internalization/capping, then we have a detailed conversation with the family regarding risks and benefits of chole tube removal versus routine exchanges.  It is also discussed that If tube is removed and has to be replaced, it will be replaced with the idea that it will be a permanent tube.   All of the above recommendations were discussed with the patient and patient's family, and all of patient's and family's questions  were answered to their expressed satisfaction.  These notes generated with voice recognition software. I apologize for typographical errors.  Campbell Lerner, MD, FACS [place]: [facility] - Partnering for exceptional care. Office: [phone]

## 2022-09-20 ENCOUNTER — Other Ambulatory Visit: Payer: Self-pay | Admitting: Surgery

## 2022-09-20 ENCOUNTER — Ambulatory Visit
Admission: RE | Admit: 2022-09-20 | Discharge: 2022-09-20 | Disposition: A | Discharge status: Home / Self Care | Payer: Medicare Other | Source: Ambulatory Visit | Attending: Surgery | Admitting: Surgery

## 2022-09-20 DIAGNOSIS — K810 Acute cholecystitis: Secondary | ICD-10-CM

## 2022-09-20 HISTORY — PX: IR CHOLANGIOGRAM EXISTING TUBE: IMG6040

## 2022-09-28 ENCOUNTER — Ambulatory Visit
Admission: RE | Admit: 2022-09-28 | Discharge: 2022-09-28 | Disposition: A | Discharge status: Home / Self Care | Payer: Medicare Other | Source: Ambulatory Visit | Attending: Surgery | Admitting: Surgery

## 2022-09-28 DIAGNOSIS — K810 Acute cholecystitis: Secondary | ICD-10-CM

## 2022-09-28 HISTORY — PX: IR RADIOLOGIST EVAL & MGMT: IMG5224

## 2022-09-28 NOTE — Progress Notes (Signed)
Referring Physician(s): Rodenberg,Denny  Chief Complaint: The patient is seen in follow up today s/p cholecystostomy tube placement  History of present illness: 89 year old female with history of acute calculous cholecystitis status post percutaneous cholecystostomy tube placement on 08/19/22.  She underwent initial cholecystostomy tube check on 09/20/22 at which time her cystic and common bile ducts were patent, thus capping trial was initiated.  She has tolerated capping trial swimmingly.  No fevers, chills, nausea, vomiting, RUQ pain, scleral icterus.  She presents today for tube check and possible removal.  Past Medical History:  Diagnosis Date   A-fib (HCC)    Anemia    Carotid artery stenosis    CHF (congestive heart failure) (HCC)    Chronic kidney disease    Coronary artery disease    Diverticulosis    GERD (gastroesophageal reflux disease)    Gout    Hiatal hernia    Hyperlipidemia    Hypertension    Osteoporosis    Pulmonary embolus [facility])     Past Surgical History:  Procedure Laterality Date   ABDOMINAL HYSTERECTOMY     ANKLE SURGERY Left 2011   IR CHOLANGIOGRAM EXISTING TUBE  09/20/2022   IR PERC CHOLECYSTOSTOMY  08/19/2022   KNEE ARTHROSCOPY Right     Allergies: Keflex [cephalexin] and Macrodantin [nitrofurantoin macrocrystal]  Medications: Prior to Admission medications   Medication Sig Start Date End Date Taking? Authorizing Provider  acetaminophen (TYLENOL) 325 MG tablet Take 2 tablets (650 mg total) by mouth every 6 (six) hours as needed for mild pain (or Fever >/= 101). 08/23/22   Loyce Dys, MD  albuterol (ACCUNEB) 1.25 MG/3ML nebulizer solution Take 1 ampule by nebulization every 6 (six) hours as needed for wheezing.    [provider]  albuterol (VENTOLIN HFA) 108 (90 Base) MCG/ACT inhaler Inhale 2 puffs into the lungs every 6 (six) hours as needed for wheezing or shortness of breath. 04/26/22   [provider]  atorvastatin  (LIPITOR) 40 MG tablet Take 40 mg by mouth daily. 11/18/16   [provider]  calcium carbonate (OSCAL) 1500 (600 Ca) MG TABS tablet Take 1 tablet by mouth in the morning and at bedtime.    [provider]  Cholecalciferol 50 MCG (2000 UT) CAPS Take 2,000 Units by mouth daily.    [provider]  digoxin (LANOXIN) 0.125 MG tablet Take 0.0625 mg by mouth daily. 08/27/21   [provider]  ELIQUIS 2.5 MG TABS tablet Take 2.5 mg by mouth 2 (two) times daily. 02/13/20   [provider]  esomeprazole (NEXIUM) 40 MG capsule Take 40 mg by mouth daily.    [provider]  levothyroxine (SYNTHROID) 25 MCG tablet Take 25 mcg by mouth daily. 07/14/21   [provider]  metoprolol succinate (TOPROL-XL) 50 MG 24 hr tablet Take 1 tablet (50 mg total) by mouth daily. Take with or immediately following a meal. 06/11/21   Wouk, Wilfred Curtis, MD  Multiple Vitamin (MULTI-VITAMIN) tablet Take 1 tablet by mouth daily.    [provider]  polyvinyl alcohol (LIQUIFILM TEARS) 1.4 % ophthalmic solution Place 2 drops into the left eye every 8 (eight) hours as needed for dry eyes. 07/26/21   [provider]  potassium chloride (KLOR-CON) 10 MEQ tablet Take 10 mEq by mouth daily as needed. 07/26/22   [provider]     Family History  Problem Relation Age of Onset   CAD Mother    Hypertension Sister  COPD Brother     Social History   Socioeconomic History   Marital status: Widowed    Spouse name: Not on file   Number of children: Not on file   Years of education: Not on file   Highest education level: Not on file  Occupational History   Not on file  Tobacco Use   Smoking status: Never    Passive exposure: Never   Smokeless tobacco: Never  Vaping Use   Vaping Use: Never used  Substance and Sexual Activity   Alcohol use: No    Alcohol/week: 0.0 standard drinks of alcohol   Drug use: No   Sexual activity: Never  Other  Topics Concern   Not on file  Social History Narrative   Not on file   Social Determinants of Health   Financial Resource Strain: Not on file  Food Insecurity: No Food Insecurity (08/18/2022)   Hunger Vital Sign    Worried About Running Out of Food in the Last Year: Never true    Ran Out of Food in the Last Year: Never true  Transportation Needs: No Transportation Needs (08/18/2022)   PRAPARE - Administrator, Civil Service (Medical): No    Lack of Transportation (Non-Medical): No  Physical Activity: Not on file  Stress: Not on file  Social Connections: Not on file     Vital Signs: There were no vitals taken for this visit.  Physical Exam Constitutional:      General: She is not in acute distress. HENT:     Head: Normocephalic.     Mouth/Throat:     Mouth: Mucous membranes are moist.  Eyes:     General: No scleral icterus. Cardiovascular:     Rate and Rhythm: Normal rate and regular rhythm.     Heart sounds: Normal heart sounds.  Pulmonary:     Breath sounds: Normal breath sounds.  Abdominal:     General: There is no distension.     Comments: RUQ cholecystotomy tube which is capped. Dressing clean, dry, and intact.   Musculoskeletal:     Right lower leg: No edema.     Left lower leg: No edema.  Skin:    General: Skin is warm and dry.     Coloration: Skin is not jaundiced.  Neurological:     Mental Status: She is alert and oriented to person, place, and time.     Imaging: Cholecystogram 09/20/22   Labs:  CBC: Recent Labs    08/19/22 0456 08/20/22 0523 08/22/22 0616 08/23/22 0402  WBC 12.8* 12.3* 9.8 7.7  HGB 11.1* 11.9* 11.5* 10.9*  HCT 33.1* 36.5 35.5* 33.1*  PLT 184 210 251 244    COAGS: Recent Labs    05/01/22 0459 08/19/22 0939 08/22/22 0616  INR 1.3* 1.4* 1.2    BMP: Recent Labs    08/20/22 0850 08/21/22 0545 08/22/22 0616 08/23/22 0402  NA 133* 136 136 138  K 4.6 4.5 3.8 3.5  CL 99 105 102 104  CO2 23 22 25 26    GLUCOSE 76 82 88 82  BUN 21 27* 25* 21  CALCIUM 8.2* 8.3* 8.5* 8.0*  CREATININE 1.34* 1.32* 1.08* 0.96  GFRNONAA 37* 37* 48* 55*    LIVER FUNCTION TESTS: Recent Labs    08/18/22 1440 08/19/22 0456 08/22/22 0616  BILITOT 1.0 1.0 0.7  AST 21 37 23  ALT 11 19 13   ALKPHOS 86 217* 112  PROT 6.7 5.9* 6.0*  ALBUMIN 3.5 2.9* 2.9*  Assessment and Plan: 89 year old female with history of acute calculous cholecystitis status post percutaneous cholecystostomy tube placement on 08/19/22.  She underwent initial cholecystostomy tube check on 09/20/22 at which time her cystic and common bile ducts were patent, and capping trial has been tolerated well.  Cholecystogram today demonstrates similar patent cystic duct and common bile duct.  I discussed the findings with Dr. Claudine Mouton as well as the patient and her son today after injection.  There was some hesitancy on the patient's part to having the tube removed today.  Therefore, the tube was flushed and capped and left in place.  She has upcoming appointment with Dr. Claudine Mouton, at which time the drain may be removed if the patient wishes to proceed.  Follow up in IR as needed as per Dr. Claudine Mouton.  Would recommend drain exchange in approximately 1 month if she elects to keep it in place.  Electronically Signed: Bennie Dallas 09/28/2022, 11:36 AM   I spent a total of 25 Minutes in face to face in clinical consultation, greater than 50% of which was counseling/coordinating care for acute calculous cholecystitis status post cholecystostomy tube placement.

## 2022-10-20 ENCOUNTER — Ambulatory Visit (INDEPENDENT_AMBULATORY_CARE_PROVIDER_SITE_OTHER): Payer: Medicare Other | Admitting: Surgery

## 2022-10-20 ENCOUNTER — Encounter: Payer: Self-pay | Admitting: Surgery

## 2022-10-20 VITALS — BP 128/81 | HR 110 | Ht 61.0 in | Wt 132.0 lb

## 2022-10-20 DIAGNOSIS — Z434 Encounter for attention to other artificial openings of digestive tract: Secondary | ICD-10-CM | POA: Diagnosis not present

## 2022-10-20 DIAGNOSIS — K8010 Calculus of gallbladder with chronic cholecystitis without obstruction: Secondary | ICD-10-CM

## 2022-10-20 DIAGNOSIS — K810 Acute cholecystitis: Secondary | ICD-10-CM | POA: Diagnosis not present

## 2022-10-20 NOTE — Progress Notes (Signed)
Surgical Clinic Progress/Follow-up Note   HPI:  89 y.o. Female presents to clinic for percutaneous cholecystostomy tube follow-up, since May 22, she has had her tube plugged, denies any history of nausea, abdominal pain, fevers or chills.  Patient reports  improvement/resolution of prior issues and has been tolerating regular diet with +flatus and normal BM's, denies N/V, fever/chills, CP, or SOB.  Review of Systems:  Constitutional: denies fever/chills  ENT: denies sore throat, hearing problems  Respiratory: denies shortness of breath, wheezing  Cardiovascular: denies chest pain, palpitations  Gastrointestinal: denies abdominal pain, N/V, or diarrhea/and bowel function as per interval history Skin: Denies any other rashes or skin discolorations as per interval history  Vital Signs:  BP 128/81   Pulse (!) 110   Ht 5\' 1"  (1.549 m)   Wt 132 lb (59.9 kg)   SpO2 97%   BMI 24.94 kg/m    Physical Exam:  Constitutional:  -- Normal body habitus  -- Awake, alert, and oriented x3  Pulmonary:  -- No crackles -- Equal breath sounds bilaterally -- Breathing non-labored at rest Cardiovascular:  --Well-perfused Gastrointestinal:  -- Soft and non-distended, right upper quadrant with capped cholecystostomy tube.  Non-tender/without tenderness to palpation, no guarding/rebound tenderness  Musculoskeletal / Integumentary:  -- Wounds or skin discoloration: None appreciated  -- Extremities: B/L UE and LE FROM, hands and feet warm, no edema   Laboratory studies:   Imaging: No new pertinent imaging available for review:     Assessment:  89 y.o. yo Female with a problem list including...  Patient Active Problem List   Diagnosis Date Noted   Cholecystostomy care [facility]) 09/13/2022   Cholecystitis 08/18/2022   Gout 06/01/2022   Sepsis due to pneumonia (HCC) 04/30/2022   Acute bronchitis due to respiratory syncytial virus (RSV) 04/30/2022   Aspiration pneumonia (HCC) 04/30/2022   CLL  (chronic lymphocytic leukemia) (HCC) 01/18/2022   Iron deficiency anemia 07/21/2021   Moderate pulmonary hypertension (HCC) 07/20/2021   Sepsis (HCC) 07/19/2021   Thrombocytopenia (HCC) 07/19/2021   Acute urinary retention 07/18/2021   History of pulmonary embolism 07/18/2021   Hypothermia 07/18/2021   Pleural effusion on right 07/18/2021   Chronic diastolic CHF (congestive heart failure) (HCC) 07/17/2021   HLD (hyperlipidemia) 07/17/2021   Pulmonary embolism (HCC) 07/17/2021   Stage 3a chronic kidney disease (CKD) (HCC) 07/17/2021   Hypothyroidism 07/17/2021   Acute respiratory failure with hypoxia (HCC) 07/17/2021   Acquired thrombophilia (HCC) 06/22/2021   Anemia of chronic disease 06/07/2021   Permanent atrial fibrillation (HCC) 06/07/2021   Hyponatremia 06/07/2021   Acute on chronic combined systolic and diastolic CHF (congestive heart failure) (HCC) 06/07/2021   Atrial fibrillation, chronic (HCC) 08/04/2020   Acute exacerbation of CHF (congestive heart failure) (HCC) 08/04/2020   Shortness of breath 08/03/2020   Hypokalemia    Palpitations    Hyperlipidemia 02/16/2020   Atrial fibrillation with RVR (HCC) 02/16/2020   Hypoxia 02/16/2020   Acute CHF (congestive heart failure) (HCC) 02/16/2020   Chronic anticoagulation 02/16/2020   Senile purpura (HCC) 11/21/2019   Nodule of upper lobe of left lung 10/30/2017   History of TIA (transient ischemic attack) 02/23/2017   Carotid artery disease (HCC) 02/23/2017   TIA (transient ischemic attack) 02/18/2017   Hiatal hernia with GERD without esophagitis 02/18/2017   History of nonmelanoma skin cancer 08/10/2015   Gallstones 06/30/2015   Chest pain 06/23/2015   Essential hypertension 06/16/2015   Aortic atherosclerosis (HCC) 06/16/2015   Diverticulosis of large intestine without hemorrhage 06/16/2015  Age-related osteoporosis without current pathological fracture 10/22/2013   Chronic ankle pain 06/28/2011    presents to clinic  for follow-up evaluation of status post percutaneous cholecystostomy, currently capped, confirmed to be patent with common ducts, progressing well.  Plan:   -After long discussion we proceeded with removal of the percutaneous drain.             - return to clinic as needed, instructed to call office if any questions or concerns  -She is quite well aware that should symptoms return she still has choices of either replacement of the percutaneous drain or proceeding with an operation.  Although she was not a good operative candidate at the time of her initial presentation she appears to be improved, especially in light of not currently being septic.  However it still may be prudent to replace a percutaneous drain should she have recurrence of acute cholecystitis.  All of the above recommendations were discussed with the patient and patient's family, and all of patient's and family's questions were answered to their expressed satisfaction.  These notes generated with voice recognition software. I apologize for typographical errors.  Campbell Lerner, MD, FACS [place]: [facility] - Partnering for exceptional care. Office: [phone]

## 2022-10-20 NOTE — Patient Instructions (Signed)
Keep a dressing over the area until it stops draining. It should stop in 2-3 days.  You may shower, remove your dressing first and wash as usual. Dry and replace the Band-Aid.   Follow-up with our office as needed.  Please call and ask to speak with a nurse if you develop questions or concerns.

## 2023-04-15 ENCOUNTER — Encounter: Payer: Self-pay | Admitting: Radiology

## 2023-04-15 ENCOUNTER — Other Ambulatory Visit: Payer: Self-pay

## 2023-04-15 ENCOUNTER — Emergency Department: Payer: Medicare Other

## 2023-04-15 ENCOUNTER — Emergency Department
Admission: EM | Admit: 2023-04-15 | Discharge: 2023-04-15 | Disposition: A | Discharge status: Home / Self Care | Payer: Medicare Other | Attending: Emergency Medicine | Admitting: Emergency Medicine

## 2023-04-15 DIAGNOSIS — W1830XA Fall on same level, unspecified, initial encounter: Secondary | ICD-10-CM | POA: Diagnosis not present

## 2023-04-15 DIAGNOSIS — I509 Heart failure, unspecified: Secondary | ICD-10-CM | POA: Diagnosis not present

## 2023-04-15 DIAGNOSIS — Z856 Personal history of leukemia: Secondary | ICD-10-CM | POA: Diagnosis not present

## 2023-04-15 DIAGNOSIS — I130 Hypertensive heart and chronic kidney disease with heart failure and stage 1 through stage 4 chronic kidney disease, or unspecified chronic kidney disease: Secondary | ICD-10-CM | POA: Insufficient documentation

## 2023-04-15 DIAGNOSIS — N189 Chronic kidney disease, unspecified: Secondary | ICD-10-CM | POA: Diagnosis not present

## 2023-04-15 DIAGNOSIS — Z7901 Long term (current) use of anticoagulants: Secondary | ICD-10-CM | POA: Insufficient documentation

## 2023-04-15 DIAGNOSIS — I2510 Atherosclerotic heart disease of native coronary artery without angina pectoris: Secondary | ICD-10-CM | POA: Insufficient documentation

## 2023-04-15 DIAGNOSIS — S0003XA Contusion of scalp, initial encounter: Secondary | ICD-10-CM | POA: Insufficient documentation

## 2023-04-15 DIAGNOSIS — W19XXXA Unspecified fall, initial encounter: Secondary | ICD-10-CM

## 2023-04-15 DIAGNOSIS — S0990XA Unspecified injury of head, initial encounter: Secondary | ICD-10-CM

## 2023-04-15 NOTE — ED Triage Notes (Signed)
Pt in via [facility] with unwitnessed fall where she hit her head. Pt takes blood thinners, no LOC, no blurred vision, pt c/o pain to left shoulder and head. Pt pressed life alert and it took 20 minutes before someone got to her to help her up.

## 2023-04-15 NOTE — ED Triage Notes (Signed)
Pt states she got up and went to the bathroom and while trying to change her underclothes she fell. Pt states she fell backwards into the bathtub. Pt denies pain. Pt DOES take eliquis and she did hit her head on the wall. Pt denies LOC.

## 2023-04-15 NOTE — Discharge Instructions (Signed)
Sinew taking your Eliquis as prescribed.  Return to the ER for any new or worsening severe headache, dizziness, vomiting, lethargy, balance problems, any strokelike symptoms such as weakness or numbness, difficulty with speech, seizures, or any other new or worsening symptoms that concern you.

## 2023-04-15 NOTE — ED Provider Notes (Signed)
[facility] Provider Note    Event Date/Time   First MD Initiated Contact with Patient 04/15/23 1324     (approximate)   History   Fall   HPI  [place] T Roblyer is a 89 y.o. female with a history of HFpEF, atrial fibrillation on Eliquis, PE, hypertension, hyperlipidemia, CAD, CKD, CLL, and osteoporosis who presents with head injury after a fall.  The patient states that she was in the bathroom changing her underwear when she let go of her walker, lost her balance, and fell, causing her to fall into the bathtub.  She hit the back of her head and her left shoulder.  She states that her shoulder is no longer bothering her.  She has a bump on the back of her head.  She denies getting dizzy or lightheaded before the fall.  She denies any loss of consciousness due to the fall.  She has no weakness or numbness, vision changes, nausea or vomiting, or any other symptoms currently.  I reviewed past medical records.  The patient's most recent outpatient encounter was on 11/4 for groin pain.  She was most recently admitted to the hospital service in April with acute cholecystitis.   Physical Exam   Triage Vital Signs: ED Triage Vitals [04/15/23 1140]  Encounter Vitals Group     BP 132/68     Systolic BP Percentile      Diastolic BP Percentile      Pulse Rate 77     Resp 17     Temp 97.7 F (36.5 C)     Temp Source Oral     SpO2 96 %     Weight 132 lb (59.9 kg)     Height 5\' 1"  (1.549 m)     Head Circumference      Peak Flow      Pain Score      Pain Loc      Pain Education      Exclude from Growth Chart     Most recent vital signs: Vitals:   04/15/23 1140  BP: 132/68  Pulse: 77  Resp: 17  Temp: 97.7 F (36.5 C)  SpO2: 96%     General: And oriented, no distress.  CV:  Good peripheral perfusion.  Resp:  Normal effort.  Abd:  No distention.  Other:  EOMI.  PERRLA.  No photophobia.  Normal speech.  No facial droop.  Motor intact in all extremities.   No pronator drift.  No ataxia.  No midline cervical, thoracic, or lumbar spinal tenderness.  Neck with full ROM.  Left shoulder with full ROM.  Hematoma to occipital scalp.   ED Results / Procedures / Treatments   Labs (all labs ordered are listed, but only abnormal results are displayed) Labs Reviewed - No data to display   EKG     RADIOLOGY  CT head: I independently viewed and interpreted the images; there is no ICH.  Radiology report indicates hematoma with no acute intracranial abnormality.  PROCEDURES:  Critical Care performed: No  Procedures   MEDICATIONS ORDERED IN ED: Medications - No data to display   IMPRESSION / MDM / ASSESSMENT AND PLAN / ED COURSE  I reviewed the triage vital signs and the nursing notes.  89 year old female with PMH as noted above, on Eliquis, presents with a head injury after mechanical fall from standing height.  Differential diagnosis includes, but is not limited to, minor head injury, concussion, ICH.  Neurologic exam is  normal.  CT head shows no ICH or other acute findings.  Patient's presentation is most consistent with acute complicated illness / injury requiring diagnostic workup.  At this time, the patient is stable for discharge home.  Presentation is consistent with minor head injury.  I counseled the patient on the results of the imaging and on return precautions; she expressed understanding.   FINAL CLINICAL IMPRESSION(S) / ED DIAGNOSES   Final diagnoses:  Fall, initial encounter  Minor head injury, initial encounter     Rx / DC Orders   ED Discharge Orders     None        Note:  This document was prepared using Dragon voice recognition software and may include unintentional dictation errors.    Dionne Bucy, MD 04/15/23 1352

## 2023-07-04 ENCOUNTER — Emergency Department (HOSPITAL_COMMUNITY): Payer: Medicare Other

## 2023-07-04 ENCOUNTER — Encounter (HOSPITAL_COMMUNITY): Payer: Self-pay

## 2023-07-04 ENCOUNTER — Other Ambulatory Visit: Payer: Self-pay

## 2023-07-04 ENCOUNTER — Observation Stay (HOSPITAL_COMMUNITY)
Admission: EM | Admit: 2023-07-04 | Discharge: 2023-07-06 | Disposition: A | Discharge status: Home / Self Care | Payer: Medicare Other | Attending: Internal Medicine | Admitting: Internal Medicine

## 2023-07-04 DIAGNOSIS — Z79899 Other long term (current) drug therapy: Secondary | ICD-10-CM | POA: Diagnosis not present

## 2023-07-04 DIAGNOSIS — E039 Hypothyroidism, unspecified: Secondary | ICD-10-CM

## 2023-07-04 DIAGNOSIS — R55 Syncope and collapse: Principal | ICD-10-CM

## 2023-07-04 DIAGNOSIS — I5022 Chronic systolic (congestive) heart failure: Secondary | ICD-10-CM | POA: Diagnosis not present

## 2023-07-04 DIAGNOSIS — I130 Hypertensive heart and chronic kidney disease with heart failure and stage 1 through stage 4 chronic kidney disease, or unspecified chronic kidney disease: Secondary | ICD-10-CM | POA: Insufficient documentation

## 2023-07-04 DIAGNOSIS — I2510 Atherosclerotic heart disease of native coronary artery without angina pectoris: Secondary | ICD-10-CM | POA: Insufficient documentation

## 2023-07-04 DIAGNOSIS — R918 Other nonspecific abnormal finding of lung field: Secondary | ICD-10-CM | POA: Diagnosis not present

## 2023-07-04 DIAGNOSIS — I480 Paroxysmal atrial fibrillation: Secondary | ICD-10-CM

## 2023-07-04 DIAGNOSIS — E782 Mixed hyperlipidemia: Secondary | ICD-10-CM

## 2023-07-04 DIAGNOSIS — E785 Hyperlipidemia, unspecified: Secondary | ICD-10-CM | POA: Diagnosis present

## 2023-07-04 DIAGNOSIS — Z1152 Encounter for screening for COVID-19: Secondary | ICD-10-CM | POA: Diagnosis not present

## 2023-07-04 DIAGNOSIS — Z9181 History of falling: Secondary | ICD-10-CM | POA: Diagnosis not present

## 2023-07-04 DIAGNOSIS — Z7901 Long term (current) use of anticoagulants: Secondary | ICD-10-CM | POA: Diagnosis not present

## 2023-07-04 DIAGNOSIS — Z86711 Personal history of pulmonary embolism: Secondary | ICD-10-CM | POA: Diagnosis not present

## 2023-07-04 DIAGNOSIS — N1832 Chronic kidney disease, stage 3b: Secondary | ICD-10-CM | POA: Insufficient documentation

## 2023-07-04 DIAGNOSIS — R29898 Other symptoms and signs involving the musculoskeletal system: Secondary | ICD-10-CM

## 2023-07-04 LAB — CBC
HCT: 38.1 % (ref 36.0–46.0)
Hemoglobin: 12.6 g/dL (ref 12.0–15.0)
MCH: 32.3 pg (ref 26.0–34.0)
MCHC: 33.1 g/dL (ref 30.0–36.0)
MCV: 97.7 fL (ref 80.0–100.0)
Platelets: 193 10*3/uL (ref 150–400)
RBC: 3.9 MIL/uL (ref 3.87–5.11)
RDW: 13 % (ref 11.5–15.5)
WBC: 8.9 10*3/uL (ref 4.0–10.5)
nRBC: 0 % (ref 0.0–0.2)

## 2023-07-04 LAB — APTT: aPTT: 33 s (ref 24–36)

## 2023-07-04 LAB — I-STAT CHEM 8, ED
BUN: 24 mg/dL — ABNORMAL HIGH (ref 8–23)
Calcium, Ion: 1.1 mmol/L — ABNORMAL LOW (ref 1.15–1.40)
Chloride: 98 mmol/L (ref 98–111)
Creatinine, Ser: 1.3 mg/dL — ABNORMAL HIGH (ref 0.44–1.00)
Glucose, Bld: 107 mg/dL — ABNORMAL HIGH (ref 70–99)
HCT: 37 % (ref 36.0–46.0)
Hemoglobin: 12.6 g/dL (ref 12.0–15.0)
Potassium: 4.8 mmol/L (ref 3.5–5.1)
Sodium: 133 mmol/L — ABNORMAL LOW (ref 135–145)
TCO2: 28 mmol/L (ref 22–32)

## 2023-07-04 LAB — DIFFERENTIAL
Abs Immature Granulocytes: 0.04 10*3/uL (ref 0.00–0.07)
Basophils Absolute: 0.1 10*3/uL (ref 0.0–0.1)
Basophils Relative: 1 %
Eosinophils Absolute: 0.1 10*3/uL (ref 0.0–0.5)
Eosinophils Relative: 1 %
Immature Granulocytes: 1 %
Lymphocytes Relative: 14 %
Lymphs Abs: 1.3 10*3/uL (ref 0.7–4.0)
Monocytes Absolute: 1 10*3/uL (ref 0.1–1.0)
Monocytes Relative: 11 %
Neutro Abs: 6.4 10*3/uL (ref 1.7–7.7)
Neutrophils Relative %: 72 %

## 2023-07-04 LAB — COMPREHENSIVE METABOLIC PANEL
ALT: 19 U/L (ref 0–44)
AST: 28 U/L (ref 15–41)
Albumin: 3.7 g/dL (ref 3.5–5.0)
Alkaline Phosphatase: 71 U/L (ref 38–126)
Anion gap: 13 (ref 5–15)
BUN: 21 mg/dL (ref 8–23)
CO2: 25 mmol/L (ref 22–32)
Calcium: 9.5 mg/dL (ref 8.9–10.3)
Chloride: 97 mmol/L — ABNORMAL LOW (ref 98–111)
Creatinine, Ser: 1.19 mg/dL — ABNORMAL HIGH (ref 0.44–1.00)
GFR, Estimated: 42 mL/min — ABNORMAL LOW (ref >60)
Glucose, Bld: 111 mg/dL — ABNORMAL HIGH (ref 70–99)
Potassium: 4.8 mmol/L (ref 3.5–5.1)
Sodium: 135 mmol/L (ref 135–145)
Total Bilirubin: 0.5 mg/dL (ref 0.0–1.2)
Total Protein: 6.6 g/dL (ref 6.5–8.1)

## 2023-07-04 LAB — PROTIME-INR
INR: 1.2 (ref 0.8–1.2)
Prothrombin Time: 14.9 s (ref 11.4–15.2)

## 2023-07-04 LAB — CBG MONITORING, ED: Glucose-Capillary: 117 mg/dL — ABNORMAL HIGH (ref 70–99)

## 2023-07-04 LAB — TROPONIN I (HIGH SENSITIVITY): Troponin I (High Sensitivity): 7 ng/L (ref <18)

## 2023-07-04 LAB — I-STAT CG4 LACTIC ACID, ED: Lactic Acid, Venous: 1 mmol/L (ref 0.5–1.9)

## 2023-07-04 LAB — ETHANOL: Alcohol, Ethyl (B): <10 mg/dL (ref <10)

## 2023-07-04 MED ORDER — MELATONIN 3 MG PO TABS: 3.0000 mg | ORAL_TABLET | Freq: Every evening | ORAL | Status: DC | PRN

## 2023-07-04 MED ORDER — ONDANSETRON HCL 4 MG/2ML IJ SOLN: 4.0000 mg | Freq: Four times a day (QID) | INTRAMUSCULAR | Status: DC | PRN

## 2023-07-04 MED ORDER — STROKE: EARLY STAGES OF RECOVERY BOOK
Freq: Once | Status: AC
  Filled 2023-07-04: qty 1

## 2023-07-04 MED ORDER — ACETAMINOPHEN 325 MG PO TABS: 650.0000 mg | ORAL_TABLET | Freq: Four times a day (QID) | ORAL | Status: DC | PRN

## 2023-07-04 MED ORDER — ACETAMINOPHEN 650 MG RE SUPP: 650.0000 mg | Freq: Four times a day (QID) | RECTAL | Status: DC | PRN

## 2023-07-04 MED ORDER — GADOBUTROL 1 MMOL/ML IV SOLN
5.0000 mL | Freq: Once | INTRAVENOUS | Status: AC | PRN
  Administered 2023-07-04: 5 mL via INTRAVENOUS

## 2023-07-04 MED ORDER — IOHEXOL 350 MG/ML SOLN
75.0000 mL | Freq: Once | INTRAVENOUS | Status: AC | PRN
  Administered 2023-07-04: 75 mL via INTRAVENOUS

## 2023-07-04 NOTE — Progress Notes (Signed)
 EEG complete - results pending

## 2023-07-04 NOTE — Code Documentation (Signed)
 Allison Novak is a 89 yr old woman with a PMH of AF, PE, CKD arriving to [facility] via EMS on 07/04/23. Pt is coming from home where she was last known well by her son at 72 when he left to go feed the cows. When he came back at 1730, he found her unable to talk. Code stroke activated by EMS for Aphasia and left sided weakness which resolved in route. Pt is on Eliquis.    Pt met at bridge by Stroke Team. Labs, CBG obtained. Airway cleared by EDP. Pt to CT with team. NIHSS 3. Pt unable to state month, aphasic, and seems to have decreased sensation in rt foot. The following imaging was completed: CT, CTA. Per Dr. Iver Nestle, CT is negative for hemorrhage, and CTA is negative for LVO.     Pt returned to ED room 25 where her workup will continue. She will need q 2 hr NIHSS and VS. She will need to be NPO until  passing a stroke swallow screen. She is not eligible for thrombolytics as she is on Eliquis. She is not thrombectomy candidate as she is LVO negative. Bedside handoff with ED RN Marcello Moores complete.

## 2023-07-04 NOTE — H&P (Signed)
 History and Physical      [name] JXB:[id number] DOB: [birth year] DOA: 07/04/2023; DOS: 07/04/2023  PCP: Lynnea Ferrier, MD *** Patient coming from: home ***  I have personally briefly reviewed patient's old medical records in [facility]  Chief Complaint: ***  HPI: Allison Novak is a 89 y.o. female with medical history significant for *** who is admitted to [facility] on 07/04/2023 with *** after presenting from home*** to [facility] ED complaining of ***.    ***       ***   ED Course:  Vital signs in the ED were notable for the following: ***  Labs were notable for the following: ***  Per my interpretation, EKG in ED demonstrated the following:  ***  Imaging in the ED, per corresponding formal radiology read, was notable for the following:  ***  While in the ED, the following were administered: ***  Subsequently, the patient was admitted  ***  ***red    Review of Systems: As per HPI otherwise 10 point review of systems negative.   Past Medical History:  Diagnosis Date   A-fib (HCC)    Anemia    Carotid artery stenosis    CHF (congestive heart failure) (HCC)    Chronic kidney disease    Coronary artery disease    Diverticulosis    GERD (gastroesophageal reflux disease)    Gout    Hiatal hernia    Hyperlipidemia    Hypertension    Osteoporosis    Pulmonary embolus [facility])     Past Surgical History:  Procedure Laterality Date   ABDOMINAL HYSTERECTOMY     ANKLE SURGERY Left 2011   IR CHOLANGIOGRAM EXISTING TUBE  09/20/2022   IR PERC CHOLECYSTOSTOMY  08/19/2022   IR RADIOLOGIST EVAL & MGMT  09/28/2022   KNEE ARTHROSCOPY Right     Social History:  reports that she has never smoked. She has never been exposed to tobacco smoke. She has never used smokeless tobacco. She reports that she does not drink alcohol and does not use drugs.   Allergies  Allergen Reactions   Keflex [Cephalexin] Other (See Comments)    Pt cant remember exact  reaction   Macrodantin [Nitrofurantoin Macrocrystal] Other (See Comments)    Family History  Problem Relation Age of Onset   CAD Mother    Hypertension Sister    COPD Brother     Family history reviewed and not pertinent ***   Prior to Admission medications   Medication Sig Start Date End Date Taking? Authorizing Provider  acetaminophen (TYLENOL) 325 MG tablet Take 2 tablets (650 mg total) by mouth every 6 (six) hours as needed for mild pain (or Fever >/= 101). 08/23/22  Yes Loyce Dys, MD  albuterol (VENTOLIN HFA) 108 (90 Base) MCG/ACT inhaler Inhale 2 puffs into the lungs every 6 (six) hours as needed for wheezing or shortness of breath. 04/26/22  Yes [provider]  allopurinol (ZYLOPRIM) 100 MG tablet Take 100 mg by mouth daily.   Yes [provider]  atorvastatin (LIPITOR) 40 MG tablet Take 40 mg by mouth daily. 11/18/16  Yes [provider]  calcium carbonate (OSCAL) 1500 (600 Ca) MG TABS tablet Take 1 tablet by mouth in the morning and at bedtime.   Yes [provider]  Cholecalciferol 50 MCG (2000 UT) CAPS Take 2,000 Units by mouth daily.   Yes [provider]  digoxin (LANOXIN) 0.125 MG tablet Take 0.0625 mg by  mouth daily. 08/27/21  Yes [provider]  ELIQUIS 2.5 MG TABS tablet Take 2.5 mg by mouth 2 (two) times daily. 02/13/20  Yes [provider]  esomeprazole (NEXIUM) 40 MG capsule Take 40 mg by mouth daily.   Yes [provider]  levothyroxine (SYNTHROID) 25 MCG tablet Take 25 mcg by mouth daily. 07/14/21  Yes [provider]  metoprolol succinate (TOPROL-XL) 100 MG 24 hr tablet Take 100 mg by mouth daily after supper.   Yes [provider]  Multiple Vitamin (MULTI-VITAMIN) tablet Take 1 tablet by mouth daily.   Yes [provider]  polyvinyl alcohol (LIQUIFILM TEARS) 1.4 % ophthalmic solution Place 2 drops into the left eye every 8 (eight) hours as needed for dry eyes.  07/26/21  Yes [provider]  potassium chloride (KLOR-CON) 10 MEQ tablet Take 10 mEq by mouth daily after supper. 07/26/22  Yes [provider]     Objective    Physical Exam: Vitals:   07/04/23 1945 07/04/23 2145 07/04/23 2200 07/04/23 2230  BP: (!) 151/94 (!) 144/107 (!) 107/90 (!) 154/84  Pulse: (!) 109 (!) 101 (!) 105 91  Resp: 14 20 (!) 26 (!) 21  Temp:    97.7 F (36.5 C)  TempSrc:    Oral  SpO2: 91% 94% 94% 92%  Weight:        General: appears to be stated age; alert, oriented Skin: warm, dry, no rash Head:  AT/[place] Mouth:  Oral mucosa membranes appear moist, normal dentition Neck: supple; trachea midline Heart:  RRR; did not appreciate any M/R/G Lungs: CTAB, did not appreciate any wheezes, rales, or rhonchi Abdomen: + BS; soft, ND, NT Vascular: 2+ pedal pulses b/l; 2+ radial pulses b/l Extremities: no peripheral edema, no muscle wasting Neuro: strength and sensation intact in upper and lower extremities b/l ***   *** Neuro: 5/5 strength of the proximal and distal flexors and extensors of the upper and lower extremities bilaterally; sensation intact in upper and lower extremities b/l; cranial nerves II through XII grossly intact; no pronator drift; no evidence suggestive of slurred speech, dysarthria, or facial droop; Normal muscle tone. No tremors.  *** Neuro: In the setting of the patient's current mental status and associated inability to follow instructions, unable to perform full neurologic exam at this time.  As such, assessment of strength, sensation, and cranial nerves is limited at this time. Patient noted to spontaneously move all 4 extremities. No tremors.  ***    Labs on Admission: I have personally reviewed following labs and imaging studies  CBC: Recent Labs  Lab 07/04/23 1838 07/04/23 1847  WBC 8.9  --   NEUTROABS 6.4  --   HGB 12.6 12.6  HCT 38.1 37.0  MCV 97.7  --   PLT 193  --    Basic Metabolic Panel: Recent Labs   Lab 07/04/23 1838 07/04/23 1847  NA 135 133*  K 4.8 4.8  CL 97* 98  CO2 25  --   GLUCOSE 111* 107*  BUN 21 24*  CREATININE 1.19* 1.30*  CALCIUM 9.5  --    GFR: Estimated Creatinine Clearance: 21.8 mL/min (A) (by C-G formula based on SCr of 1.3 mg/dL (H)). Liver Function Tests: Recent Labs  Lab 07/04/23 1838  AST 28  ALT 19  ALKPHOS 71  BILITOT 0.5  PROT 6.6  ALBUMIN 3.7   No results for input(s): "LIPASE", "AMYLASE" in the last 168 hours. No results for input(s): "AMMONIA" in the last 168  hours. Coagulation Profile: Recent Labs  Lab 07/04/23 1838  INR 1.2   Cardiac Enzymes: No results for input(s): "CKTOTAL", "CKMB", "CKMBINDEX", "TROPONINI" in the last 168 hours. BNP (last 3 results) No results for input(s): "PROBNP" in the last 8760 hours. HbA1C: No results for input(s): "HGBA1C" in the last 72 hours. CBG: Recent Labs  Lab 07/04/23 1839  GLUCAP 117*   Lipid Profile: No results for input(s): "CHOL", "HDL", "LDLCALC", "TRIG", "CHOLHDL", "LDLDIRECT" in the last 72 hours. Thyroid Function Tests: No results for input(s): "TSH", "T4TOTAL", "FREET4", "T3FREE", "THYROIDAB" in the last 72 hours. Anemia Panel: No results for input(s): "VITAMINB12", "FOLATE", "FERRITIN", "TIBC", "IRON", "RETICCTPCT" in the last 72 hours. Urine analysis:    Component Value Date/Time   COLORURINE YELLOW (A) 08/21/2022 1325   APPEARANCEUR HAZY (A) 08/21/2022 1325   LABSPEC 1.019 08/21/2022 1325   PHURINE 5.0 08/21/2022 1325   GLUCOSEU NEGATIVE 08/21/2022 1325   HGBUR NEGATIVE 08/21/2022 1325   BILIRUBINUR NEGATIVE 08/21/2022 1325   KETONESUR 5 (A) 08/21/2022 1325   PROTEINUR NEGATIVE 08/21/2022 1325   NITRITE NEGATIVE 08/21/2022 1325   LEUKOCYTESUR SMALL (A) 08/21/2022 1325    Radiological Exams on Admission: MR BRAIN W WO CONTRAST Result Date: 07/04/2023 CLINICAL DATA:  Initial evaluation for acute mental status change, unknown cause. EXAM: MRI HEAD WITHOUT AND WITH  CONTRAST TECHNIQUE: Multiplanar, multiecho pulse sequences of the brain and surrounding structures were obtained without and with intravenous contrast. CONTRAST:  5mL GADAVIST GADOBUTROL 1 MMOL/ML IV SOLN COMPARISON:  CTs from earlier the same day. FINDINGS: Brain: Generalized age-related cerebral atrophy. Confluent and hazy T2/FLAIR signal abnormality involving the periventricular and deep white matter both cerebral hemispheres as well as the pons, most characteristic of chronic microvascular ischemic disease, moderately advanced for age. No abnormal foci of restricted diffusion to suggest acute or subacute ischemia. No made of an apparent small focus of diffusion signal abnormality overlying the left cerebral convexity on axial DWI sequence (series 2, image 43), not seen on corresponding sequences, and favored to be artifactual. No areas of chronic cortical infarction. No acute or chronic intracranial blood products. No mass lesion, midline shift or mass effect. Ventricular prominence related global parenchymal volume loss without hydrocephalus. No extra-axial fluid collection. Pituitary gland and suprasellar region within normal limits. No intrinsic temporal lobe abnormality. No abnormal enhancement. Vascular: Major intracranial vascular flow voids are maintained. Skull and upper cervical spine: Craniocervical junction within normal limits. Bone marrow signal intensity normal. No scalp soft tissue abnormality. Sinuses/Orbits: Prior bilateral ocular lens replacement. Paranasal sinuses are largely clear. No significant mastoid effusion. Other: None. IMPRESSION: 1. No acute intracranial abnormality. 2. Age-related cerebral atrophy with moderately advanced chronic microvascular ischemic disease. Electronically Signed   By: Rise Mu M.D.   On: 07/04/2023 22:42   CT ANGIO HEAD NECK W WO CM (CODE STROKE) Result Date: 07/04/2023 CLINICAL DATA:  Provided history: Neuro deficit, acute, stroke suspected.  EXAM: CT ANGIOGRAPHY HEAD AND NECK WITH AND WITHOUT CONTRAST TECHNIQUE: Multidetector CT imaging of the head and neck was performed using the standard protocol during bolus administration of intravenous contrast. Multiplanar CT image reconstructions and MIPs were obtained to evaluate the vascular anatomy. Carotid stenosis measurements (when applicable) are obtained utilizing NASCET criteria, using the distal internal carotid diameter as the denominator. RADIATION DOSE REDUCTION: This exam was performed according to the departmental dose-optimization program which includes automated exposure control, adjustment of the mA and/or kV according to patient size and/or use of iterative reconstruction technique. CONTRAST:  75mL  OMNIPAQUE IOHEXOL 350 MG/ML SOLN COMPARISON:  Non-contrast head CT performed earlier today 07/04/2023. MRA head 02/19/2017. FINDINGS: CTA NECK FINDINGS Aortic arch: The visualized aortic arch is normal in caliber. The innominate artery origin is excluded from the field of view. No hemodynamically significant stenosis within the visible innominate or proximal subclavian arteries. Right carotid system: CCA and ICA patent within the neck without stenosis. Mild atherosclerotic plaque about the carotid bifurcation. Left carotid system: The common carotid artery origin is incompletely included in the field of view. Within this limitation, the common carotid and internal carotid arteries are patent within the neck without hemodynamically significant stenosis (50% or greater). Atherosclerotic plaque at the CCA origin, about the carotid bifurcation and within the proximal ICA. Vertebral arteries: Codominant and patent within the neck. Atherosclerotic plaque at the left vertebral artery origin with no more than mild stenosis. Nonstenotic atherosclerotic plaque at the right vertebral artery origin. Skeleton: Spondylosis of the cervical and visualized upper thoracic levels. No acute fracture or aggressive  osseous lesion. Other neck: Subcentimeter thyroid nodules multiple thyroid nodules measuring up to 12 mm, not meeting consensus criteria for ultrasound follow-up based on size. No follow-up imaging recommended. Reference: J Am Coll Radiol. 2015 Feb;12(2): 143-50. Upper chest: Partially imaged right pleural effusion. Patchy ground-glass opacity within the imaged portions of the lungs, bilaterally. Review of the MIP images confirms the above findings CTA HEAD FINDINGS Anterior circulation: The intracranial internal carotid arteries are patent. Atherosclerotic plaque within both vessels with no more than mild stenosis. The M1 middle cerebral arteries are patent. No M2 proximal branch occlusion or high-grade proximal stenosis. The anterior cerebral arteries are patent. Hypoplastic right A1 segment. No intracranial aneurysm is identified. Posterior circulation: The intracranial vertebral arteries are patent. The basilar artery is patent. The posterior cerebral arteries are patent. Posterior communicating arteries are diminutive or absent, bilaterally. Venous sinuses: Within the limitations of contrast timing, no convincing thrombus. Anatomic variants: As described. Review of the MIP images confirms the above findings No emergent large vessel occlusion identified. These results were communicated to Dr. Iver Nestle at 7:11 pmon 2/25/2025by text page via the [facility] messaging system. IMPRESSION: CTA neck: 1. The left common carotid artery origin is incompletely included in the field of view. Within this limitation, the common carotid and internal carotid arteries are patent within the neck without hemodynamically significant stenosis (50% or greater). Atherosclerotic plaque bilaterally, as described. 2. Vertebral arteries patent within the neck with no more than mild atherosclerotic stenosis. 3. Aortic Atherosclerosis (ICD10-I70.0). 4. Partially imaged right pleural effusion. 5. Patchy ground glass pulmonary opacities within the  imaged portions of the bilateral lungs. This may reflect edema. However, exclude signs/symptoms that would suggest infection CTA head: 1. No proximal intracranial large vessel occlusion or high-grade proximal arterial stenosis identified. 2. Non-stenotic atherosclerotic plaque within the intracranial internal carotid arteries. Electronically Signed   By: Jackey Loge D.O.   On: 07/04/2023 19:12   CT HEAD CODE STROKE WO CONTRAST Result Date: 07/04/2023 CLINICAL DATA:  Code stroke. Provided history: Neuro deficit, acute, stroke suspected. EXAM: CT HEAD WITHOUT CONTRAST TECHNIQUE: Contiguous axial images were obtained from the base of the skull through the vertex without intravenous contrast. RADIATION DOSE REDUCTION: This exam was performed according to the departmental dose-optimization program which includes automated exposure control, adjustment of the mA and/or kV according to patient size and/or use of iterative reconstruction technique. COMPARISON:  Non-contrast head CT 04/15/2023.  Brain MRI 02/19/2017. FINDINGS: Brain: Generalized parenchymal atrophy. Unchanged chronic lacunar infarct within  the left basal ganglia and anterior limb of left internal capsule. Advanced patchy and ill-defined hypoattenuation elsewhere within the cerebral white matter, nonspecific but compatible with chronic small vessel ischemic disease. There is no acute intracranial hemorrhage. No demarcated cortical infarct. No extra-axial fluid collection. No evidence of an intracranial mass. No midline shift. Vascular: No hyperdense vessel.  Atherosclerotic calcifications. Skull: No calvarial fracture or aggressive osseous lesion. Sinuses/Orbits: No mass or acute finding within the imaged orbits. No significant paranasal sinus disease. ASPECTS ([place] Stroke Program Early CT Score) - Ganglionic level infarction (caudate, lentiform nuclei, internal capsule, insula, M1-M3 cortex): 7 - Supraganglionic infarction (M4-M6 cortex): 3 Total score  (0-10 with 10 being normal): 10 (when discounting a chronic lacunar infarct within the left basal ganglia/anterior limb of left internal capsule). No evidence of an acute intracranial abnormality. These results were communicated to Dr. Iver Nestle At 6:55 pmon 2/25/2025by text page via the [facility] messaging system. IMPRESSION: 1.  No evidence of an acute intracranial abnormality. 2. Unchanged chronic lacunar infarct within the left basal ganglia/anterior limb of left internal capsule. 3. Background parenchymal atrophy and advanced cerebral white matter chronic small vessel ischemic disease. Electronically Signed   By: Jackey Loge D.O.   On: 07/04/2023 18:55      Assessment/Plan   Principal Problem:   Weakness of left upper extremity   ***            ***                ***                 ***               ***               ***               ***                ***               ***               ***               ***               ***              ***          ***  DVT prophylaxis: SCD's ***  Code Status: Full code*** Family Communication: none*** Disposition Plan: Per Rounding Team Consults called: none***;  Admission status: ***     I SPENT GREATER THAN 75 *** MINUTES IN CLINICAL CARE TIME/MEDICAL DECISION-MAKING IN COMPLETING THIS ADMISSION.      Chaney Born Marko Skalski DO [facility]  From 7PM - 7AM   07/04/2023, 11:19 PM   ***

## 2023-07-04 NOTE — Progress Notes (Signed)
 Not available for EEG at the moment due to being at imaging .  Will try back later as schedule allows.

## 2023-07-04 NOTE — ED Provider Notes (Addendum)
  EMERGENCY DEPARTMENT AT [facility] Provider Note   CSN: [id number] Arrival date & time: 07/04/23  4098  An emergency department physician performed an initial assessment on this suspected stroke patient at 32.  History  Chief Complaint  Patient presents with   Code Stroke    Allison Novak is a 89 y.o. female.  Patient arrives as a code stroke.  Was found by family member slumped over in porch chair.  Last seen 3 hours ago.  History of A-fib on Eliquis.  He had last seen her prior to her doing some yard work.  She is very independent.  EMS called code stroke in the field due to not speaking and speech issues.  She has a history of heart failure hypertension CAD.  She has greatly improved en route with EMS.  She started to answer questions and alert.  She tells me that she was sitting on the porch chair started to not feel well She knew EMS personnel was there.  She has very good about taking her medications.  She denies any pain at this time.  The history is provided by the patient and the EMS personnel.       Home Medications Prior to Admission medications   Medication Sig Start Date End Date Taking? Authorizing Provider  acetaminophen (TYLENOL) 325 MG tablet Take 2 tablets (650 mg total) by mouth every 6 (six) hours as needed for mild pain (or Fever >/= 101). 08/23/22   Loyce Dys, MD  albuterol (ACCUNEB) 1.25 MG/3ML nebulizer solution Take 1 ampule by nebulization every 6 (six) hours as needed for wheezing.    [provider]  albuterol (VENTOLIN HFA) 108 (90 Base) MCG/ACT inhaler Inhale 2 puffs into the lungs every 6 (six) hours as needed for wheezing or shortness of breath. 04/26/22   [provider]  allopurinol (ZYLOPRIM) 100 MG tablet Take 100 mg by mouth daily.    [provider]  atorvastatin (LIPITOR) 40 MG tablet Take 40 mg by mouth daily. 11/18/16   [provider]  calcium carbonate (OSCAL) 1500 (600 Ca) MG  TABS tablet Take 1 tablet by mouth in the morning and at bedtime.    [provider]  Cholecalciferol 50 MCG (2000 UT) CAPS Take 2,000 Units by mouth daily.    [provider]  digoxin (LANOXIN) 0.125 MG tablet Take 0.0625 mg by mouth daily. 08/27/21   [provider]  ELIQUIS 2.5 MG TABS tablet Take 2.5 mg by mouth 2 (two) times daily. 02/13/20   [provider]  esomeprazole (NEXIUM) 40 MG capsule Take 40 mg by mouth daily.    [provider]  levothyroxine (SYNTHROID) 25 MCG tablet Take 25 mcg by mouth daily. 07/14/21   [provider]  metoprolol succinate (TOPROL-XL) 100 MG 24 hr tablet Take 100 mg by mouth daily.    [provider]  Multiple Vitamin (MULTI-VITAMIN) tablet Take 1 tablet by mouth daily.    [provider]  polyvinyl alcohol (LIQUIFILM TEARS) 1.4 % ophthalmic solution Place 2 drops into the left eye every 8 (eight) hours as needed for dry eyes. 07/26/21   [provider]  potassium chloride (KLOR-CON) 10 MEQ tablet Take 10 mEq by mouth daily as needed. 07/26/22   [provider]      Allergies    Keflex [cephalexin] and Macrodantin [nitrofurantoin macrocrystal]    Review of Systems   Review of Systems  Physical Exam Updated Vital Signs BP Marland Kitchen)  170/105   Pulse 97   Resp 20   Wt 61.8 kg   SpO2 94%   BMI 25.74 kg/m  Physical Exam Vitals and nursing note reviewed.  Constitutional:      General: She is not in acute distress.    Appearance: She is well-developed. She is not ill-appearing.  HENT:     Head: Normocephalic and atraumatic.     Nose: Nose normal.     Mouth/Throat:     Mouth: Mucous membranes are moist.  Eyes:     Extraocular Movements: Extraocular movements intact.     Conjunctiva/sclera: Conjunctivae normal.     Pupils: Pupils are equal, round, and reactive to light.  Cardiovascular:     Rate and Rhythm: Normal rate and regular rhythm.     Pulses: Normal pulses.      Heart sounds: Normal heart sounds. No murmur heard. Pulmonary:     Effort: Pulmonary effort is normal. No respiratory distress.     Breath sounds: Normal breath sounds.  Abdominal:     Palpations: Abdomen is soft.     Tenderness: There is no abdominal tenderness.  Musculoskeletal:        General: No swelling.     Cervical back: Normal range of motion and neck supple.  Skin:    General: Skin is warm and dry.     Capillary Refill: Capillary refill takes less than 2 seconds.  Neurological:     General: No focal deficit present.     Mental Status: She is alert and oriented to person, place, and time.     Cranial Nerves: No cranial nerve deficit.     Motor: No weakness.     Coordination: Coordination normal.     Comments: She is little bit slow with her speech but no obvious slurred speech or dysarthria, strength appears to be intact maybe some mild numbness in the right leg, visual fields appear to be intact coordination appears to be intact  Psychiatric:        Mood and Affect: Mood normal.     ED Results / Procedures / Treatments   Labs (all labs ordered are listed, but only abnormal results are displayed) Labs Reviewed  COMPREHENSIVE METABOLIC PANEL - Abnormal; Notable for the following components:      Result Value   Chloride 97 (*)    Glucose, Bld 111 (*)    Creatinine, Ser 1.19 (*)    GFR, Estimated 42 (*)    All other components within normal limits  I-STAT CHEM 8, ED - Abnormal; Notable for the following components:   Sodium 133 (*)    BUN 24 (*)    Creatinine, Ser 1.30 (*)    Glucose, Bld 107 (*)    Calcium, Ion 1.10 (*)    All other components within normal limits  CBG MONITORING, ED - Abnormal; Notable for the following components:   Glucose-Capillary 117 (*)    All other components within normal limits  PROTIME-INR  APTT  CBC  DIFFERENTIAL  ETHANOL  I-STAT CG4 LACTIC ACID, ED  TROPONIN I (HIGH SENSITIVITY)  TROPONIN I (HIGH SENSITIVITY)    EKG EKG  Interpretation Date/Time:  Tuesday July 04 2023 19:02:54 EST Ventricular Rate:  114 PR Interval:    QRS Duration:  90 QT Interval:  345 QTC Calculation: 476 R Axis:   230  Text Interpretation: Atrial fibrillation Right axis deviation Minimal ST depression, diffuse leads Confirmed by Virgina Norfolk [phone] on 07/04/2023 9:24:17 PM  Radiology CT  ANGIO HEAD NECK W WO CM (CODE STROKE) Result Date: 07/04/2023 CLINICAL DATA:  Provided history: Neuro deficit, acute, stroke suspected. EXAM: CT ANGIOGRAPHY HEAD AND NECK WITH AND WITHOUT CONTRAST TECHNIQUE: Multidetector CT imaging of the head and neck was performed using the standard protocol during bolus administration of intravenous contrast. Multiplanar CT image reconstructions and MIPs were obtained to evaluate the vascular anatomy. Carotid stenosis measurements (when applicable) are obtained utilizing NASCET criteria, using the distal internal carotid diameter as the denominator. RADIATION DOSE REDUCTION: This exam was performed according to the departmental dose-optimization program which includes automated exposure control, adjustment of the mA and/or kV according to patient size and/or use of iterative reconstruction technique. CONTRAST:  75mL OMNIPAQUE IOHEXOL 350 MG/ML SOLN COMPARISON:  Non-contrast head CT performed earlier today 07/04/2023. MRA head 02/19/2017. FINDINGS: CTA NECK FINDINGS Aortic arch: The visualized aortic arch is normal in caliber. The innominate artery origin is excluded from the field of view. No hemodynamically significant stenosis within the visible innominate or proximal subclavian arteries. Right carotid system: CCA and ICA patent within the neck without stenosis. Mild atherosclerotic plaque about the carotid bifurcation. Left carotid system: The common carotid artery origin is incompletely included in the field of view. Within this limitation, the common carotid and internal carotid arteries are patent within the neck  without hemodynamically significant stenosis (50% or greater). Atherosclerotic plaque at the CCA origin, about the carotid bifurcation and within the proximal ICA. Vertebral arteries: Codominant and patent within the neck. Atherosclerotic plaque at the left vertebral artery origin with no more than mild stenosis. Nonstenotic atherosclerotic plaque at the right vertebral artery origin. Skeleton: Spondylosis of the cervical and visualized upper thoracic levels. No acute fracture or aggressive osseous lesion. Other neck: Subcentimeter thyroid nodules multiple thyroid nodules measuring up to 12 mm, not meeting consensus criteria for ultrasound follow-up based on size. No follow-up imaging recommended. Reference: J Am Coll Radiol. 2015 Feb;12(2): 143-50. Upper chest: Partially imaged right pleural effusion. Patchy ground-glass opacity within the imaged portions of the lungs, bilaterally. Review of the MIP images confirms the above findings CTA HEAD FINDINGS Anterior circulation: The intracranial internal carotid arteries are patent. Atherosclerotic plaque within both vessels with no more than mild stenosis. The M1 middle cerebral arteries are patent. No M2 proximal branch occlusion or high-grade proximal stenosis. The anterior cerebral arteries are patent. Hypoplastic right A1 segment. No intracranial aneurysm is identified. Posterior circulation: The intracranial vertebral arteries are patent. The basilar artery is patent. The posterior cerebral arteries are patent. Posterior communicating arteries are diminutive or absent, bilaterally. Venous sinuses: Within the limitations of contrast timing, no convincing thrombus. Anatomic variants: As described. Review of the MIP images confirms the above findings No emergent large vessel occlusion identified. These results were communicated to Dr. Iver Nestle at 7:11 pmon 2/25/2025by text page via the [facility] messaging system. IMPRESSION: CTA neck: 1. The left common carotid artery  origin is incompletely included in the field of view. Within this limitation, the common carotid and internal carotid arteries are patent within the neck without hemodynamically significant stenosis (50% or greater). Atherosclerotic plaque bilaterally, as described. 2. Vertebral arteries patent within the neck with no more than mild atherosclerotic stenosis. 3. Aortic Atherosclerosis (ICD10-I70.0). 4. Partially imaged right pleural effusion. 5. Patchy ground glass pulmonary opacities within the imaged portions of the bilateral lungs. This may reflect edema. However, exclude signs/symptoms that would suggest infection CTA head: 1. No proximal intracranial large vessel occlusion or high-grade proximal arterial stenosis identified. 2. Non-stenotic atherosclerotic  plaque within the intracranial internal carotid arteries. Electronically Signed   By: Jackey Loge D.O.   On: 07/04/2023 19:12   CT HEAD CODE STROKE WO CONTRAST Result Date: 07/04/2023 CLINICAL DATA:  Code stroke. Provided history: Neuro deficit, acute, stroke suspected. EXAM: CT HEAD WITHOUT CONTRAST TECHNIQUE: Contiguous axial images were obtained from the base of the skull through the vertex without intravenous contrast. RADIATION DOSE REDUCTION: This exam was performed according to the departmental dose-optimization program which includes automated exposure control, adjustment of the mA and/or kV according to patient size and/or use of iterative reconstruction technique. COMPARISON:  Non-contrast head CT 04/15/2023.  Brain MRI 02/19/2017. FINDINGS: Brain: Generalized parenchymal atrophy. Unchanged chronic lacunar infarct within the left basal ganglia and anterior limb of left internal capsule. Advanced patchy and ill-defined hypoattenuation elsewhere within the cerebral white matter, nonspecific but compatible with chronic small vessel ischemic disease. There is no acute intracranial hemorrhage. No demarcated cortical infarct. No extra-axial fluid  collection. No evidence of an intracranial mass. No midline shift. Vascular: No hyperdense vessel.  Atherosclerotic calcifications. Skull: No calvarial fracture or aggressive osseous lesion. Sinuses/Orbits: No mass or acute finding within the imaged orbits. No significant paranasal sinus disease. ASPECTS ([place] Stroke Program Early CT Score) - Ganglionic level infarction (caudate, lentiform nuclei, internal capsule, insula, M1-M3 cortex): 7 - Supraganglionic infarction (M4-M6 cortex): 3 Total score (0-10 with 10 being normal): 10 (when discounting a chronic lacunar infarct within the left basal ganglia/anterior limb of left internal capsule). No evidence of an acute intracranial abnormality. These results were communicated to Dr. Iver Nestle At 6:55 pmon 2/25/2025by text page via the [facility] messaging system. IMPRESSION: 1.  No evidence of an acute intracranial abnormality. 2. Unchanged chronic lacunar infarct within the left basal ganglia/anterior limb of left internal capsule. 3. Background parenchymal atrophy and advanced cerebral white matter chronic small vessel ischemic disease. Electronically Signed   By: Jackey Loge D.O.   On: 07/04/2023 18:55    Procedures Procedures    Medications Ordered in ED Medications  iohexol (OMNIPAQUE) 350 MG/ML injection 75 mL (75 mLs Intravenous Contrast Given 07/04/23 1852)  gadobutrol (GADAVIST) 1 MMOL/ML injection 5 mL (5 mLs Intravenous Contrast Given 07/04/23 2045)    ED Course/ Medical Decision Making/ A&P                                 Medical Decision Making Amount and/or Complexity of Data Reviewed Labs: ordered. Radiology: ordered.  Risk Decision regarding hospitalization.   Allison Novak is here as a code stroke.  She was found by family member slumped over in her porch chair.  Last seen 3 hours earlier.  Unremarkable vitals.  History of A-fib on Eliquis.  She was doing yard work.  She is very independent.  Does all her ADLs.  She has a history  of hypertension CHF PE as well.  She has improved en route with EMS.  She is able to tell me that she was sitting on the chair got dizzy lightheaded next and she knew she saw EMS.  Is having trouble speaking in the field may be some left arm weakness last code stroke enacted.  Neurology team at the bedside head CT CTA unremarkable.  Differential diagnosis could be TIA, seizure, cardiac process.  EKG shows atrial fibrillation.  Lab work collected including troponin CBC CMP.  Per my review and interpretation of labs no significant anemia electrolyte abnormality.  Creatinine  mildly elevated.  MRI is in process.  Will admit to the hospitalist service for further syncope workup.  Troponin pending.  This chart was dictated using voice recognition software.  Despite best efforts to proofread,  errors can occur which can change the documentation meaning.         Final Clinical Impression(s) / ED Diagnoses Final diagnoses:  Syncope, unspecified syncope type    Rx / DC Orders ED Discharge Orders     None         Virgina Norfolk, DO 07/04/23 2147    Virgina Norfolk, DO 07/04/23 2154

## 2023-07-04 NOTE — Consult Note (Addendum)
 NEUROLOGY CONSULT NOTE   Date of service: July 04, 2023 Patient Name: Allison Novak MRN:  [id number] DOB:  1925-08-08 Chief Complaint: "found unresponsive" Requesting Provider: Virgina Norfolk, DO  History of Present Illness  Allison Novak is a 89 y.o. female with hx of a-fib on Eliquis, HTN, CHF, CKD stage 3b, mitral regurgitation, pulmonary hypertension, arthritis, hyperlipidemia, bilateral carotid stenosis,GERD and PE, hard of hearing, who presents after being found unresponsive by family.  Patient was in her usual state of health at 1445 and was preparing to do yardwork per her son.  Her son went to do some farm work and came back at [employer] to find her slumped over sitting on the porch unresponsive.  Per patient's son, patient lives alone and manages her own ADLs.  She does not drive due to her age and manages her own finances with help from family.  Son states that she takes all of her medications regularly and likely has not missed any doses of her Eliquis.  Per patient's daughter, her blood pressure has been elevated over the past couple days with systolic into the 170s.  While en route to the hospital, patient improved and upon arrival was able to say single words and simple phrases and could move all extremities.  Patient continued to gradually improve over the course of her evaluation.  Initially she was very slow to speak but eventually she was able to provide some history that she remembers sitting on the porch, wearing her sweatshirt and starting to feel very hot.  She cannot describe exactly where she was feeling hot.  She does not clearly describe any focal onset of symptoms or shaking but reports she was generally shaky in both arms a few days ago, which improved after she ate some food.  She continued to be unable to name the month  LKW: 1445 Modified rankin score: 2-Slight disability-UNABLE to perform all activities but does not need assistance IV Thrombolysis: No, on  Eliquis EVT: No, no LVO  NIHSS components Score: Comment  1a Level of Conscious 0[x]  1[]  2[]  3[]      1b LOC Questions 0[]  1[x]  2[]       1c LOC Commands 0[x]  1[]  2[]       2 Best Gaze 0[x]  1[]  2[]       3 Visual 0[x]  1[]  2[]  3[]      4 Facial Palsy 0[x]  1[]  2[]  3[]      5a Motor Arm - left 0[x]  1[]  2[]  3[]  4[]  UN[]    5b Motor Arm - Right 0[x]  1[]  2[]  3[]  4[]  UN[]    6a Motor Leg - Left 0[]  1[x]  2[]  3[]  4[]  UN[]    6b Motor Leg - Right 0[]  1[x]  2[]  3[]  4[]  UN[]    7 Limb Ataxia 0[x]  1[]  2[]  3[]  UN[]     8 Sensory 0[]  1[x]  2[]  UN[]    Initially scored a 1 as less responsive to tickle on the right foot compared to the left; while this persisted she was otherwise able to state she could feel examiner touching on both sides and that her sensation was equal throughout as her language improved  9 Best Language 0[]  1[x]  2[]  3[]    Initially moderate aphasia improving to mild aphasia, for example stated "sand" instead of "sun"  10 Dysarthria 0[x]  1[]  2[]  UN[]      11 Extinct. and Inattention 0[x]  1[]  2[]       TOTAL: 5       ROS  Unable to assess secondary to patient's mental status   Past  History   Past Medical History:  Diagnosis Date   A-fib (HCC)    Anemia    Carotid artery stenosis    CHF (congestive heart failure) (HCC)    Chronic kidney disease    Coronary artery disease    Diverticulosis    GERD (gastroesophageal reflux disease)    Gout    Hiatal hernia    Hyperlipidemia    Hypertension    Osteoporosis    Pulmonary embolus [facility])     Past Surgical History:  Procedure Laterality Date   ABDOMINAL HYSTERECTOMY     ANKLE SURGERY Left 2011   IR CHOLANGIOGRAM EXISTING TUBE  09/20/2022   IR PERC CHOLECYSTOSTOMY  08/19/2022   IR RADIOLOGIST EVAL & MGMT  09/28/2022   KNEE ARTHROSCOPY Right     Family History: Family History  Problem Relation Age of Onset   CAD Mother    Hypertension Sister    COPD Brother     Social History  reports that she has never smoked. She has never been  exposed to tobacco smoke. She has never used smokeless tobacco. She reports that she does not drink alcohol and does not use drugs.  Allergies  Allergen Reactions   Keflex [Cephalexin] Other (See Comments)    Pt cant remember exact reaction   Macrodantin [Nitrofurantoin Macrocrystal] Other (See Comments)    Medications  No current facility-administered medications for this encounter.  Current Outpatient Medications:    acetaminophen (TYLENOL) 325 MG tablet, Take 2 tablets (650 mg total) by mouth every 6 (six) hours as needed for mild pain (or Fever >/= 101)., Disp: 20 tablet, Rfl: 0   albuterol (ACCUNEB) 1.25 MG/3ML nebulizer solution, Take 1 ampule by nebulization every 6 (six) hours as needed for wheezing., Disp: , Rfl:    albuterol (VENTOLIN HFA) 108 (90 Base) MCG/ACT inhaler, Inhale 2 puffs into the lungs every 6 (six) hours as needed for wheezing or shortness of breath., Disp: , Rfl:    atorvastatin (LIPITOR) 40 MG tablet, Take 40 mg by mouth daily., Disp: , Rfl: 2   calcium carbonate (OSCAL) 1500 (600 Ca) MG TABS tablet, Take 1 tablet by mouth in the morning and at bedtime., Disp: , Rfl:    Cholecalciferol 50 MCG (2000 UT) CAPS, Take 2,000 Units by mouth daily., Disp: , Rfl:    digoxin (LANOXIN) 0.125 MG tablet, Take 0.0625 mg by mouth daily., Disp: , Rfl:    ELIQUIS 2.5 MG TABS tablet, Take 2.5 mg by mouth 2 (two) times daily., Disp: , Rfl:    esomeprazole (NEXIUM) 40 MG capsule, Take 40 mg by mouth daily., Disp: , Rfl:    levothyroxine (SYNTHROID) 25 MCG tablet, Take 25 mcg by mouth daily., Disp: , Rfl:    metoprolol succinate (TOPROL-XL) 50 MG 24 hr tablet, Take 1 tablet (50 mg total) by mouth daily. Take with or immediately following a meal., Disp: 30 tablet, Rfl: 1   Multiple Vitamin (MULTI-VITAMIN) tablet, Take 1 tablet by mouth daily., Disp: , Rfl:    polyvinyl alcohol (LIQUIFILM TEARS) 1.4 % ophthalmic solution, Place 2 drops into the left eye every 8 (eight) hours as needed  for dry eyes., Disp: , Rfl:    potassium chloride (KLOR-CON) 10 MEQ tablet, Take 10 mEq by mouth daily as needed., Disp: , Rfl:   Facility-Administered Medications Ordered in Other Encounters:    epoetin alfa-epbx (RETACRIT) injection 40,000 Units, 40,000 Units, Subcutaneous, Q21 days, Creig Hines, MD, 40,000 Units at 09/06/21 1425  Vitals  Vitals:   2023/07/30 1843  Weight: 61.8 kg    Body mass index is 25.74 kg/m.  Physical Exam   Constitutional: Appears well-developed and well-nourished for her age Psych: Affect appropriate to situation.  Pleasant and cooperative Eyes: No scleral injection.  HENT: No OP obstruction.  Head: Normocephalic.  Cardiovascular: Perfusing extremities well Respiratory: Effort normal, non-labored breathing.  GI: Soft.  No distension. There is no tenderness.   Neurologic Examination   Neuro: Mental Status: Patient is awake, alert, able to give some limited history but slow to respond with some word finding difficulties/word substitutions noted even on casual speech.  Gradually improving over the course of examination.  Disoriented to month but oriented to age.  Slow to respond to some commands Cranial Nerves: II: Visual Fields are full.  III,IV, VI: EOMI without ptosis or diploplia.  V: Facial sensation is symmetric to temperature VII: Facial movement is symmetric.  VIII: hearing is intact to voice (hard of hearing at baseline but intact) XII: tongue is midline without atrophy or fasciculations.  Motor: No drift in the bilateral upper extremities.  Slight drift in the bilateral lower extremities. Sensory: Sensation is symmetric to light touch throughout Cerebellar: FNF and toe to hand are intact bilaterally     Labs/Imaging/Neurodiagnostic studies   CBC:  Recent Labs  Lab 2023/07/30 1838 07-30-2023 1847  WBC 8.9  --   NEUTROABS 6.4  --   HGB 12.6 12.6  HCT 38.1 37.0  MCV 97.7  --   PLT 193  --    Basic Metabolic Panel:  Lab  Results  Component Value Date   NA 133 (L) 30-Jul-2023   K 4.8 07/30/23   CO2 26 08/23/2022   GLUCOSE 107 (H) 07-30-2023   BUN 24 (H) 2023/07/30   CREATININE 1.30 (H) 07-30-23   CALCIUM 8.0 (L) 08/23/2022   GFRNONAA 55 (L) 08/23/2022   GFRAA 60 (L) 02/19/2017    CT Head without contrast(Personally reviewed): No acute abnormality  CT angio Head and Neck with contrast(Personally reviewed): No LVO  MRI Brain(Personally reviewed): Pending   ASSESSMENT   Allison Novak is a 89 y.o. female with history of of a-fib on Eliquis, HTN, CHF, CKD stage 3b, moderate mitral regurgitation, moderate to severe tricuspid regurgitation, pulmonary hypertension, arthritis, hyperlipidemia, bilateral carotid stenosis, hypothyroidism, GERD and PE who presents after being found unresponsive by her family this afternoon.  Patient had been in her usual state of health and was preparing to do yard work at [employer] today.  Her son left to do some farm work and returned at 1730 to find her sitting on the porch slumped over and unresponsive.  Per family, her blood pressure had been elevated over the past couple days.  He called EMS, and while en route to the hospital, patient did improve and was able to say single words and short phrases and move all 4 extremities upon arrival, with continued improvement over the course of evaluation  Unclear etiology of her loss of consciousness at this time.  Description as she is able to relay it is not clearly consistent with seizure but she is having a gradual clearing of her sensorium which could be referable to a postictal Todd's phenomenon.  Atypical for stroke/TIA to present with sudden onset of feeling hot, but this information was not available at the time CTA head and neck was obtained.  From a neurological perspective we will start with EEG and MRI brain.    RECOMMENDATIONS  -Stat CTA head  and neck to rule out LVO such as top of the basilar syndrome  -Routine EEG -MRI  brain with and without contrast -Okay to resume Eliquis if MRI results negative -Neurology will follow along, appreciate management of comorbidities and workup for nonneurological causes of syncope per ED/primary team ______________________________________________________________________   Brooke Dare MD-PhD [facility] [phone]   CRITICAL CARE Performed by: Gordy Councilman   Total critical care time: 40 minutes  Critical care time was exclusive of separately billable procedures and treating other patients.  Critical care was necessary to treat or prevent imminent or life-threatening deterioration; emergent evaluation for consideration of thrombolytic or thrombectomy.  Critical care was time spent personally by me on the following activities: development of treatment plan with patient and/or surrogate as well as nursing, discussions with consultants, evaluation of patient's response to treatment, examination of patient, obtaining history from patient or surrogate, ordering and performing treatments and interventions, ordering and review of laboratory studies, ordering and review of radiographic studies, pulse oximetry and re-evaluation of patient's condition.

## 2023-07-04 NOTE — ED Triage Notes (Signed)
 Pt arrives via EMS from home as a Code Stroke. See SRN note.

## 2023-07-05 ENCOUNTER — Encounter (HOSPITAL_COMMUNITY): Payer: Self-pay | Admitting: Internal Medicine

## 2023-07-05 ENCOUNTER — Observation Stay (HOSPITAL_BASED_OUTPATIENT_CLINIC_OR_DEPARTMENT_OTHER): Payer: Medicare Other

## 2023-07-05 ENCOUNTER — Observation Stay (HOSPITAL_COMMUNITY): Payer: Medicare Other

## 2023-07-05 DIAGNOSIS — R9389 Abnormal findings on diagnostic imaging of other specified body structures: Secondary | ICD-10-CM

## 2023-07-05 DIAGNOSIS — R569 Unspecified convulsions: Secondary | ICD-10-CM

## 2023-07-05 DIAGNOSIS — I480 Paroxysmal atrial fibrillation: Secondary | ICD-10-CM | POA: Diagnosis not present

## 2023-07-05 DIAGNOSIS — E039 Hypothyroidism, unspecified: Secondary | ICD-10-CM | POA: Diagnosis not present

## 2023-07-05 DIAGNOSIS — I5022 Chronic systolic (congestive) heart failure: Secondary | ICD-10-CM | POA: Diagnosis present

## 2023-07-05 DIAGNOSIS — R55 Syncope and collapse: Secondary | ICD-10-CM

## 2023-07-05 DIAGNOSIS — R410 Disorientation, unspecified: Secondary | ICD-10-CM

## 2023-07-05 LAB — URINALYSIS, ROUTINE W REFLEX MICROSCOPIC
Bilirubin Urine: NEGATIVE
Glucose, UA: NEGATIVE mg/dL
Hgb urine dipstick: NEGATIVE
Ketones, ur: NEGATIVE mg/dL
Nitrite: NEGATIVE
Protein, ur: NEGATIVE mg/dL
Specific Gravity, Urine: 1.019 (ref 1.005–1.030)
pH: 8 (ref 5.0–8.0)

## 2023-07-05 LAB — CBC WITH DIFFERENTIAL/PLATELET
Abs Immature Granulocytes: 0.02 10*3/uL (ref 0.00–0.07)
Basophils Absolute: 0.1 10*3/uL (ref 0.0–0.1)
Basophils Relative: 1 %
Eosinophils Absolute: 0.1 10*3/uL (ref 0.0–0.5)
Eosinophils Relative: 1 %
HCT: 34.5 % — ABNORMAL LOW (ref 36.0–46.0)
Hemoglobin: 11.8 g/dL — ABNORMAL LOW (ref 12.0–15.0)
Immature Granulocytes: 0 %
Lymphocytes Relative: 20 %
Lymphs Abs: 1.5 10*3/uL (ref 0.7–4.0)
MCH: 33.1 pg (ref 26.0–34.0)
MCHC: 34.2 g/dL (ref 30.0–36.0)
MCV: 96.6 fL (ref 80.0–100.0)
Monocytes Absolute: 1 10*3/uL (ref 0.1–1.0)
Monocytes Relative: 13 %
Neutro Abs: 4.9 10*3/uL (ref 1.7–7.7)
Neutrophils Relative %: 65 %
Platelets: 191 10*3/uL (ref 150–400)
RBC: 3.57 MIL/uL — ABNORMAL LOW (ref 3.87–5.11)
RDW: 12.9 % (ref 11.5–15.5)
WBC: 7.6 10*3/uL (ref 4.0–10.5)
nRBC: 0 % (ref 0.0–0.2)

## 2023-07-05 LAB — COMPREHENSIVE METABOLIC PANEL
ALT: 24 U/L (ref 0–44)
AST: 34 U/L (ref 15–41)
Albumin: 3.3 g/dL — ABNORMAL LOW (ref 3.5–5.0)
Alkaline Phosphatase: 80 U/L (ref 38–126)
Anion gap: 11 (ref 5–15)
BUN: 20 mg/dL (ref 8–23)
CO2: 26 mmol/L (ref 22–32)
Calcium: 9.2 mg/dL (ref 8.9–10.3)
Chloride: 97 mmol/L — ABNORMAL LOW (ref 98–111)
Creatinine, Ser: 1.03 mg/dL — ABNORMAL HIGH (ref 0.44–1.00)
GFR, Estimated: 50 mL/min — ABNORMAL LOW (ref >60)
Glucose, Bld: 110 mg/dL — ABNORMAL HIGH (ref 70–99)
Potassium: 4.2 mmol/L (ref 3.5–5.1)
Sodium: 134 mmol/L — ABNORMAL LOW (ref 135–145)
Total Bilirubin: 0.8 mg/dL (ref 0.0–1.2)
Total Protein: 5.9 g/dL — ABNORMAL LOW (ref 6.5–8.1)

## 2023-07-05 LAB — ECHOCARDIOGRAM COMPLETE
AR max vel: 2.47 cm2
AV Area VTI: 2.31 cm2
AV Area mean vel: 2.44 cm2
AV Mean grad: 3 mm[Hg]
AV Peak grad: 5 mm[Hg]
Ao pk vel: 1.11 m/s
S' Lateral: 2.6 cm
Weight: 2179.91 [oz_av]

## 2023-07-05 LAB — LIPID PANEL
Cholesterol: 143 mg/dL (ref 0–200)
HDL: 51 mg/dL (ref >40)
LDL Cholesterol: 84 mg/dL (ref 0–99)
Total CHOL/HDL Ratio: 2.8 {ratio}
Triglycerides: 38 mg/dL (ref <150)
VLDL: 8 mg/dL (ref 0–40)

## 2023-07-05 LAB — RAPID URINE DRUG SCREEN, HOSP PERFORMED
Amphetamines: NOT DETECTED
Barbiturates: NOT DETECTED
Benzodiazepines: NOT DETECTED
Cocaine: NOT DETECTED
Opiates: NOT DETECTED
Tetrahydrocannabinol: NOT DETECTED

## 2023-07-05 LAB — RPR: RPR Ser Ql: NONREACTIVE

## 2023-07-05 LAB — TROPONIN I (HIGH SENSITIVITY)
Troponin I (High Sensitivity): 8 ng/L (ref <18)
Troponin I (High Sensitivity): 9 ng/L (ref <18)

## 2023-07-05 LAB — HEMOGLOBIN A1C
Hgb A1c MFr Bld: 5.3 % (ref 4.8–5.6)
Mean Plasma Glucose: 105.41 mg/dL

## 2023-07-05 LAB — HIV ANTIBODY (ROUTINE TESTING W REFLEX): HIV Screen 4th Generation wRfx: NONREACTIVE

## 2023-07-05 LAB — VITAMIN B12: Vitamin B-12: 544 pg/mL (ref 180–914)

## 2023-07-05 LAB — TSH: TSH: 2.648 u[IU]/mL (ref 0.350–4.500)

## 2023-07-05 LAB — DIGOXIN LEVEL: Digoxin Level: 0.9 ng/mL (ref 0.8–2.0)

## 2023-07-05 LAB — RESP PANEL BY RT-PCR (RSV, FLU A&B, COVID)  RVPGX2
Influenza A by PCR: NEGATIVE
Influenza B by PCR: NEGATIVE
Resp Syncytial Virus by PCR: NEGATIVE
SARS Coronavirus 2 by RT PCR: NEGATIVE

## 2023-07-05 LAB — AMMONIA: Ammonia: 17 umol/L (ref 9–35)

## 2023-07-05 LAB — MAGNESIUM: Magnesium: 1.9 mg/dL (ref 1.7–2.4)

## 2023-07-05 MED ORDER — LEVOTHYROXINE SODIUM 25 MCG PO TABS
25.0000 ug | ORAL_TABLET | Freq: Every day | ORAL | Status: DC
  Administered 2023-07-05 – 2023-07-06 (×2): 25 ug via ORAL
  Filled 2023-07-05 (×2): qty 1

## 2023-07-05 MED ORDER — ATORVASTATIN CALCIUM 40 MG PO TABS
40.0000 mg | ORAL_TABLET | Freq: Every day | ORAL | Status: DC
  Administered 2023-07-05 – 2023-07-06 (×2): 40 mg via ORAL
  Filled 2023-07-05 (×2): qty 1

## 2023-07-05 MED ORDER — METOPROLOL SUCCINATE ER 100 MG PO TB24
100.0000 mg | ORAL_TABLET | Freq: Every day | ORAL | Status: DC
  Administered 2023-07-05: 100 mg via ORAL
  Filled 2023-07-05: qty 1

## 2023-07-05 MED ORDER — DIGOXIN 125 MCG PO TABS
0.0625 mg | ORAL_TABLET | Freq: Every day | ORAL | Status: DC
  Administered 2023-07-05 – 2023-07-06 (×2): 0.0625 mg via ORAL
  Filled 2023-07-05 (×2): qty 1

## 2023-07-05 MED ORDER — APIXABAN 2.5 MG PO TABS
2.5000 mg | ORAL_TABLET | Freq: Two times a day (BID) | ORAL | Status: DC
  Administered 2023-07-05 – 2023-07-06 (×3): 2.5 mg via ORAL
  Filled 2023-07-05 (×3): qty 1

## 2023-07-05 NOTE — Progress Notes (Signed)
*  PRELIMINARY RESULTS* Echocardiogram 2D Echocardiogram has been performed.  Allison Novak 07/05/2023, 11:40 AM

## 2023-07-05 NOTE — Evaluation (Signed)
 Occupational Therapy Evaluation Patient Details Name: KRISTIN BARCUS MRN: [id number] DOB: 05/08/1926 Today's Date: 07/05/2023   History of Present Illness   89 y.o. female presents on 07/04/2023 with syncope after presenting from home to [facility] ED complaining of loss of consciousness.  There was some concern for slurred speech along with left upper extremity weakness. PMH with medical history significant for A. fib on Eliquis, HLD, TIA, HTN, carotid artery disease and aortic atherosclerosis     Clinical Impressions Pt c/o no pain, feeling good overall, daughter present during session, no slurring of speech, Pt HOH. Co-treat with PT. Pt lives alone, son assists in the morning and makes two meals for her, has assistance with cleaning, Pt independent with ADLs using RW. Pt currently close to baseline, CGA for safety with slight posterior lean when standing, but able to self correct. Pt stood at sink at supervision/CGA level for 10 minutes brushing teeth, able to complete dressing without assist. Pt has no further acute OT needs, no follow up needed. Pt has all DME and support at home needed to remain safe.      If plan is discharge home, recommend the following:   A little help with walking and/or transfers;A little help with bathing/dressing/bathroom;Assist for transportation;Help with stairs or ramp for entrance     Functional Status Assessment   Patient has had a recent decline in their functional status and demonstrates the ability to make significant improvements in function in a reasonable and predictable amount of time.     Equipment Recommendations   None recommended by OT     Recommendations for Other Services         Precautions/Restrictions   Precautions Precautions: Fall Recall of Precautions/Restrictions: Intact Restrictions Weight Bearing Restrictions Per Provider Order: No     Mobility Bed Mobility Overal bed mobility: Needs Assistance Bed Mobility: Supine  to Sit     Supine to sit: Min assist     General bed mobility comments: min A for trunk elevation, height of stretcher made it more difficult to get feet on floor.    Transfers Overall transfer level: Needs assistance   Transfers: Sit to/from Stand, Bed to chair/wheelchair/BSC Sit to Stand: Supervision     Step pivot transfers: Contact guard assist     General transfer comment: CGA for safety, slight posterior lean but once she started ambulating was able to self correct.      Balance Overall balance assessment: Needs assistance Sitting-balance support: No upper extremity supported, Feet supported Sitting balance-Leahy Scale: Good   Postural control: Posterior lean Standing balance support: Single extremity supported, During functional activity Standing balance-Leahy Scale: Fair Standing balance comment: slight posterior lean but able to self correct once up and going. Pt able to stand at sink for 10 mins, no LOB                           ADL either performed or assessed with clinical judgement   ADL Overall ADL's : At baseline;Modified independent                                       General ADL Comments: CGA for transfers due to posterior lean, grossly at baseline of mod I. Able to stand at sink resting 1 hand on counter for 10 minutes     Vision Baseline Vision/History: 1 Wears glasses Ability  to See in Adequate Light: 1 Impaired Patient Visual Report: No change from baseline       Perception         Praxis         Pertinent Vitals/Pain Pain Assessment Pain Assessment: No/denies pain     Extremity/Trunk Assessment Upper Extremity Assessment Upper Extremity Assessment: RUE deficits/detail;LUE deficits/detail RUE Deficits / Details: arthritis to B hands, mild decrease in GM/FM hand skills, good shoulder/elbow ROM. RUE Sensation: WNL RUE Coordination: decreased fine motor;decreased gross motor LUE Deficits / Details:  arthritis to B hands, mild decrease in GM/FM hand skills, good shoulder/elbow ROM. LUE Sensation: WNL LUE Coordination: decreased fine motor;decreased gross motor   Lower Extremity Assessment Lower Extremity Assessment: Defer to PT evaluation       Communication Communication Communication: Impaired Factors Affecting Communication: Hearing impaired   Cognition Arousal: Alert Behavior During Therapy: WFL for tasks assessed/performed                                 Following commands: Intact       Cueing  General Comments   Cueing Techniques: Verbal cues      Exercises     Shoulder Instructions      Home Living Family/patient expects to be discharged to:: Private residence Living Arrangements: Alone Available Help at Discharge: Family;Available PRN/intermittently Type of Home: House Home Access: Ramped entrance     Home Layout: Two level;Able to live on main level with bedroom/bathroom     Bathroom Shower/Tub: Tub/shower unit         Home Equipment: Agricultural consultant (2 wheels);Shower seat;Wheelchair - manual;Cane - single point   Additional Comments: Pt lives alone, son assists in mornings with meals      Prior Functioning/Environment Prior Level of Function : Needs assist             Mobility Comments: mod I with RW around home ADLs Comments: Pt ind with dressing/bathing, someone helps with cleaning/cooking    OT Problem List: Decreased strength;Impaired balance (sitting and/or standing)   OT Treatment/Interventions:        OT Goals(Current goals can be found in the care plan section)   Acute Rehab OT Goals Patient Stated Goal: to return home OT Goal Formulation: With patient/family Time For Goal Achievement: 07/20/23 Potential to Achieve Goals: Good   OT Frequency:       Co-evaluation PT/OT/SLP Co-Evaluation/Treatment: Yes Reason for Co-Treatment: For patient/therapist safety;To address functional/ADL transfers   OT  goals addressed during session: ADL's and self-care;Proper use of Adaptive equipment and DME      AM-PAC OT "6 Clicks" Daily Activity     Outcome Measure Help from another person eating meals?: None Help from another person taking care of personal grooming?: A Little Help from another person toileting, which includes using toliet, bedpan, or urinal?: A Little Help from another person bathing (including washing, rinsing, drying)?: A Little Help from another person to put on and taking off regular upper body clothing?: A Little Help from another person to put on and taking off regular lower body clothing?: A Little 6 Click Score: 19   End of Session Equipment Utilized During Treatment: Gait belt;Rolling walker (2 wheels) Nurse Communication: Mobility status  Activity Tolerance: Patient tolerated treatment well Patient left: in chair;with family/visitor present;Other (comment) (with PT)  OT Visit Diagnosis: Unsteadiness on feet (R26.81);Other abnormalities of gait and mobility (R26.89);Muscle weakness (generalized) (M62.81)  Time: 8295-6213 OT Time Calculation (min): 40 min Charges:  OT General Charges $OT Visit: 1 Visit OT Evaluation $OT Eval Low Complexity: 1 Low OT Treatments $Self Care/Home Management : 8-22 mins  Paden, OTR/L   Alexis Goodell 07/05/2023, 12:30 PM

## 2023-07-05 NOTE — Progress Notes (Signed)
 NEUROLOGY CONSULT FOLLOW UP NOTE   Date of service: July 05, 2023 Patient Name: Allison Novak MRN:  [id number] DOB:  03-21-1926    Allison Novak is a 89 y.o. female with history of of a-fib on Eliquis, HTN, CHF, CKD stage 3b, moderate mitral regurgitation, moderate to severe tricuspid regurgitation, pulmonary hypertension, arthritis, hyperlipidemia, bilateral carotid stenosis, hypothyroidism, GERD and PE who presented on 2/25 after being found unresponsive by her family.  Patient had been in her usual state of health and was preparing to do yard work at [employer].  Her son left to do some farm work and returned at 1730 to find her sitting on the porch slumped over and unresponsive.  Per family, her blood pressure had been elevated over the past couple days.  He called EMS, and while en route to the hospital, patient did improve and was able to say single words and short phrases and move all 4 extremities upon arrival, with continued improvement over the course of evaluation   Unclear etiology of her loss of consciousness at this time.  Description as she is able to relay it is not clearly consistent with seizure but she is having a gradual clearing of her sensorium which could be referable to a postictal Todd's phenomenon.  Atypical for stroke/TIA to present with sudden onset of feeling hot, but this information was not available at the time CTA head and neck was obtained.  From a neurological perspective we will start with EEG and MRI brain.    Interval Hx/subjective   - Sleepy on my entry; didn't sleep well overnight due to frequent tests and interruptions per daughter at bedside - Awakens to voice, but more confused than yesterday   On further history from daughter at bedside, patient does manage her own medications but family has not been checking behind her to confirm that she is not missing doses or accidentally taking extra doses.  They do balance her checkbook and noticed some occasional  subtraction errors but notable payments are missed payments.  Patient did have an episode of leaving the stove sometime ago but now does not really cook as the family provides most of her food.  They do not feel that she has been this confused in prior hospitalizations  Vitals   Current vital signs: BP (!) 106/52   Pulse 74   Temp 97.8 F (36.6 C) (Oral)   Resp 10   Wt 61.8 kg   SpO2 92%   BMI 25.74 kg/m  Vital signs in last 24 hours: Temp:  [97.7 F (36.5 C)-97.8 F (36.6 C)] 97.8 F (36.6 C) (02/26 0527) Pulse Rate:  [37-113] 74 (02/26 0700) Resp:  [10-27] 10 (02/26 0700) BP: (104-187)/(48-116) 106/52 (02/26 0700) SpO2:  [91 %-95 %] 92 % (02/26 0700) Weight:  [61.8 kg] 61.8 kg (02/25 1843)  Vitals:   07/05/23 0430 07/05/23 0500 07/05/23 0527 07/05/23 0700  BP: (!) 104/48 112/64  (!) 106/52  Pulse: 96 73  74  Resp: 11 17  10   Temp:   97.8 F (36.6 C)   TempSrc:   Oral   SpO2: 93% 93%  92%  Weight:         Body mass index is 25.74 kg/m.  Physical Exam   Constitutional: Appears frail Psych: Affect confused, calm Eyes: No scleral injection.  HENT: No OP obstrucion.  Head: Normocephalic.  Cardiovascular: Normal rate, irregular rhythm on monitor Respiratory: Effort normal, non-labored breathing.  GI: Soft.  No distension. There is  no tenderness.    Neurologic Examination   Mental status: Sleepy but awakens readily to voice.  Follows simple commands.  Continues to have paraphasic errors for example " water" instead of "daughter."  Speech is garbled at times and difficult to understand, but is oriented to [facility], still cannot state the month for me.  Poor attention/concentration and hypophonic speech Cranial nerves: EOMI, visual fields full to confrontation, pupils equal round reactive to light, face symmetric, tongue midline, Motor: Symmetric slight drift of the bilateral upper extremities.  Good antigravity strength in both lower extremities today  without drift Sensory: Allodynia to touch of the left foot (baseline per family secondary to prior injury).  Otherwise symmetric in all 4   Medications  Current Facility-Administered Medications:    acetaminophen (TYLENOL) tablet 650 mg, 650 mg, Oral, Q6H PRN **OR** acetaminophen (TYLENOL) suppository 650 mg, 650 mg, Rectal, Q6H PRN, Howerter, Justin B, DO   apixaban (ELIQUIS) tablet 2.5 mg, 2.5 mg, Oral, BID, Howerter, Justin B, DO   atorvastatin (LIPITOR) tablet 40 mg, 40 mg, Oral, Daily, Howerter, Justin B, DO   digoxin (LANOXIN) tablet 0.0625 mg, 0.0625 mg, Oral, Daily, Howerter, Justin B, DO   levothyroxine (SYNTHROID) tablet 25 mcg, 25 mcg, Oral, Daily, Howerter, Justin B, DO, 25 mcg at 07/05/23 4401   melatonin tablet 3 mg, 3 mg, Oral, QHS PRN, Howerter, Justin B, DO   metoprolol succinate (TOPROL-XL) 24 hr tablet 100 mg, 100 mg, Oral, QPC supper, Howerter, Justin B, DO   ondansetron (ZOFRAN) injection 4 mg, 4 mg, Intravenous, Q6H PRN, Howerter, Justin B, DO  Current Outpatient Medications:    acetaminophen (TYLENOL) 325 MG tablet, Take 2 tablets (650 mg total) by mouth every 6 (six) hours as needed for mild pain (or Fever >/= 101)., Disp: 20 tablet, Rfl: 0   albuterol (VENTOLIN HFA) 108 (90 Base) MCG/ACT inhaler, Inhale 2 puffs into the lungs every 6 (six) hours as needed for wheezing or shortness of breath., Disp: , Rfl:    allopurinol (ZYLOPRIM) 100 MG tablet, Take 100 mg by mouth daily., Disp: , Rfl:    atorvastatin (LIPITOR) 40 MG tablet, Take 40 mg by mouth daily., Disp: , Rfl: 2   calcium carbonate (OSCAL) 1500 (600 Ca) MG TABS tablet, Take 1 tablet by mouth in the morning and at bedtime., Disp: , Rfl:    Cholecalciferol 50 MCG (2000 UT) CAPS, Take 2,000 Units by mouth daily., Disp: , Rfl:    digoxin (LANOXIN) 0.125 MG tablet, Take 0.0625 mg by mouth daily., Disp: , Rfl:    ELIQUIS 2.5 MG TABS tablet, Take 2.5 mg by mouth 2 (two) times daily., Disp: , Rfl:    esomeprazole  (NEXIUM) 40 MG capsule, Take 40 mg by mouth daily., Disp: , Rfl:    levothyroxine (SYNTHROID) 25 MCG tablet, Take 25 mcg by mouth daily., Disp: , Rfl:    metoprolol succinate (TOPROL-XL) 100 MG 24 hr tablet, Take 100 mg by mouth daily after supper., Disp: , Rfl:    Multiple Vitamin (MULTI-VITAMIN) tablet, Take 1 tablet by mouth daily., Disp: , Rfl:    polyvinyl alcohol (LIQUIFILM TEARS) 1.4 % ophthalmic solution, Place 2 drops into the left eye every 8 (eight) hours as needed for dry eyes., Disp: , Rfl:    potassium chloride (KLOR-CON) 10 MEQ tablet, Take 10 mEq by mouth daily after supper., Disp: , Rfl:   Facility-Administered Medications Ordered in Other Encounters:    epoetin alfa-epbx (RETACRIT) injection 40,000 Units, 40,000 Units,  Subcutaneous, Q21 days, Creig Hines, MD, 40,000 Units at 09/06/21 1425  Labs and Diagnostic Imaging   TSH 2.648  CBC:  Recent Labs  Lab 07/04/23 1838 07/04/23 1847 07/04/23 2347  WBC 8.9  --  7.6  NEUTROABS 6.4  --  4.9  HGB 12.6 12.6 11.8*  HCT 38.1 37.0 34.5*  MCV 97.7  --  96.6  PLT 193  --  191    Basic Metabolic Panel:  Lab Results  Component Value Date   NA 134 (L) 07/04/2023   K 4.2 07/04/2023   CO2 26 07/04/2023   GLUCOSE 110 (H) 07/04/2023   BUN 20 07/04/2023   CREATININE 1.03 (H) 07/04/2023   CALCIUM 9.2 07/04/2023   GFRNONAA 50 (L) 07/04/2023   GFRAA 60 (L) 02/19/2017   Lipid Panel:  Lab Results  Component Value Date   LDLCALC 84 07/04/2023   HgbA1c:  Lab Results  Component Value Date   HGBA1C 5.3 07/04/2023    Alcohol Level     Component Value Date/Time   ETH <10 07/04/2023 1838   INR  Lab Results  Component Value Date   INR 1.2 07/04/2023   APTT  Lab Results  Component Value Date   APTT 33 07/04/2023    MRI Brain(Personally reviewed): 1. No acute intracranial abnormality. 2. Age-related cerebral atrophy with moderately advanced chronic microvascular ischemic disease.  rEEG:  Report pending    Assessment   Allison Novak is a 89 y.o. female who presented yesterday after a loss of consciousness.  No unilateral weakness was reported to me although was apparently reported to admitting hospitalist (arm).  Daughter at bedside reports perhaps may be just some facial droop which is unclear given that she was slumped over.  I am concerned that her mental status this morning is most consistent with hypoactive delirium in the setting of hospitalization and poor sleep; unfortunately she is actually more confused than on my evaluation yesterday.  Therefore will also check some extra altered mental status labs; awaiting EEG report  Recommendations  -Reversible causes of dementia labs: TSH reassuring will add B12, thiamine, RPR, HIV, ammonia -Discussed with family at bedside monitoring patient's medications carefully to confirm adherence at home given the level of confusion she is having here -Discussed with family outpatient neurocognitive testing may be helpful to confirm safety of her managing independently -EEG report pending -Neurology will follow along; discussed with primary team via secure chat ______________________________________________________________________   Signed, Gordy Councilman, MD [facility] Available 7 AM to 7 PM, outside these hours please contact Neurologist on call listed on AMION

## 2023-07-05 NOTE — ED Notes (Signed)
 Patient ambulatory to restroom with 2 wheel walker

## 2023-07-05 NOTE — Progress Notes (Signed)
 Physical Therapy Evaluation Patient Details Name: Allison Novak MRN: [id number] DOB: 03-27-1926 Today's Date: 07/05/2023  History of Present Illness  89 y.o. female presents on 07/04/2023 with syncope after presenting from home to [facility] ED complaining of loss of consciousness.  There was some concern for slurred speech along with left upper extremity weakness. PMH with medical history significant for A. fib on Eliquis, HLD, TIA, HTN, carotid artery disease and aortic atherosclerosis  Clinical Impression  Pt admitted with above diagnosis. Pt was able to ambulate with RW with CGA with initial min assist due to posterior lean. Pt reports 3 falls in last year with noted poor balance reactions with challenges.  Daughter present and can support pt at home for safety initially.  Pt agrees to use RW and attend Outpt PT balance program.  Will follow acutely.  Pt currently with functional limitations due to the deficits listed below (see PT Problem List). Pt will benefit from acute skilled PT to increase their independence and safety with mobility to allow discharge.           If plan is discharge home, recommend the following: A little help with walking and/or transfers;A little help with bathing/dressing/bathroom;Assistance with cooking/housework;Assist for transportation;Help with stairs or ramp for entrance   Can travel by private vehicle        Equipment Recommendations None recommended by PT  Recommendations for Other Services       Functional Status Assessment Patient has had a recent decline in their functional status and demonstrates the ability to make significant improvements in function in a reasonable and predictable amount of time.     Precautions / Restrictions Precautions Precautions: Fall Recall of Precautions/Restrictions: Intact Restrictions Weight Bearing Restrictions Per Provider Order: No      Mobility  Bed Mobility Overal bed mobility: Needs Assistance Bed Mobility:  Supine to Sit     Supine to sit: Min assist     General bed mobility comments: min A for trunk elevation, height of stretcher made it more difficult to get feet on floor.    Transfers Overall transfer level: Needs assistance   Transfers: Sit to/from Stand, Bed to chair/wheelchair/BSC Sit to Stand: Supervision   Step pivot transfers: Contact guard assist       General transfer comment: CGA for safety, slight posterior lean but once she started ambulating was able to self correct.    Ambulation/Gait Ambulation/Gait assistance: Contact guard assist Gait Distance (Feet): 150 Feet Assistive device: Rolling walker (2 wheels) Gait Pattern/deviations: Step-through pattern, Decreased stride length, Trunk flexed, Leaning posteriorly   Gait velocity interpretation: <1.31 ft/sec, indicative of household ambulator   General Gait Details: Initially pt with posterior lean but the longer she was up with PT and OT the better her balance became.  Pt demonstrates fair safety with use of RW and agrees to use it intiially when she goes home  Acupuncturist Bed    Modified Rankin (Stroke Patients Only)       Balance Overall balance assessment: Needs assistance Sitting-balance support: No upper extremity supported, Feet supported Sitting balance-Leahy Scale: Good   Postural control: Posterior lean Standing balance support: Single extremity supported, During functional activity Standing balance-Leahy Scale: Fair Standing balance comment: slight posterior lean but able to self correct once up and going. Pt able to stand at sink for 10 mins, no LOB  Pertinent Vitals/Pain Pain Assessment Pain Assessment: No/denies pain    Home Living Family/patient expects to be discharged to:: Private residence Living Arrangements: Alone Available Help at Discharge: Family;Available PRN/intermittently Type of Home:  House Home Access: Ramped entrance       Home Layout: Two level;Able to live on main level with bedroom/bathroom Home Equipment: Rolling Walker (2 wheels);Shower seat;Wheelchair - manual;Cane - single point Additional Comments: Pt lives alone, son assists in mornings with meals    Prior Function Prior Level of Function : Needs assist             Mobility Comments: mod I with cane vs walking stick vs  RW around home ADLs Comments: Pt ind with dressing/bathing, someone helps with cleaning/cooking     Extremity/Trunk Assessment   Upper Extremity Assessment Upper Extremity Assessment: Defer to OT evaluation RUE Deficits / Details: arthritis to B hands, mild decrease in GM/FM hand skills, good shoulder/elbow ROM. RUE Sensation: WNL RUE Coordination: decreased fine motor;decreased gross motor LUE Deficits / Details: arthritis to B hands, mild decrease in GM/FM hand skills, good shoulder/elbow ROM. LUE Sensation: WNL LUE Coordination: decreased fine motor;decreased gross motor    Lower Extremity Assessment Lower Extremity Assessment: RLE deficits/detail;LLE deficits/detail RLE Deficits / Details: WFL for tasks grossly 4/5 LLE Deficits / Details: grossly 3+/5    Cervical / Trunk Assessment Cervical / Trunk Assessment: Kyphotic  Communication   Communication Communication: Impaired Factors Affecting Communication: Hearing impaired    Cognition Arousal: Alert Behavior During Therapy: WFL for tasks assessed/performed   PT - Cognitive impairments: No apparent impairments                         Following commands: Intact       Cueing Cueing Techniques: Verbal cues     General Comments General comments (skin integrity, edema, etc.): 96% RA, 79 bpm    Exercises     Assessment/Plan    PT Assessment Patient needs continued PT services  PT Problem List Decreased activity tolerance;Decreased balance;Decreased mobility;Decreased knowledge of use of  DME;Decreased safety awareness;Decreased knowledge of precautions;Cardiopulmonary status limiting activity       PT Treatment Interventions DME instruction;Functional mobility training;Therapeutic activities;Therapeutic exercise;Balance training;Patient/family education;Gait training;Stair training    PT Goals (Current goals can be found in the Care Plan section)  Acute Rehab PT Goals Patient Stated Goal: to go home PT Goal Formulation: With patient Time For Goal Achievement: 07/19/23 Potential to Achieve Goals: Good    Frequency Min 1X/week     Co-evaluation PT/OT/SLP Co-Evaluation/Treatment: Yes Reason for Co-Treatment: For patient/therapist safety;To address functional/ADL transfers PT goals addressed during session: Mobility/safety with mobility OT goals addressed during session: ADL's and self-care;Proper use of Adaptive equipment and DME       AM-PAC PT "6 Clicks" Mobility  Outcome Measure Help needed turning from your back to your side while in a flat bed without using bedrails?: A Little Help needed moving from lying on your back to sitting on the side of a flat bed without using bedrails?: A Little Help needed moving to and from a bed to a chair (including a wheelchair)?: A Little Help needed standing up from a chair using your arms (e.g., wheelchair or bedside chair)?: A Little Help needed to walk in hospital room?: A Little Help needed climbing 3-5 steps with a railing? : A Lot 6 Click Score: 17    End of Session Equipment Utilized During Treatment: Gait belt Activity Tolerance: Patient  limited by fatigue Patient left: with call bell/phone within reach;with family/visitor present (on strethcer) Nurse Communication: Mobility status PT Visit Diagnosis: Muscle weakness (generalized) (M62.81);Unsteadiness on feet (R26.81)    Time: 1610-9604 PT Time Calculation (min) (ACUTE ONLY): 41 min   Charges:   PT Evaluation $PT Eval Moderate Complexity: 1 Mod PT  Treatments $Gait Training: 8-22 mins PT General Charges $$ ACUTE PT VISIT: 1 Visit         Kennith Morss M,PT Acute Rehab Services [phone]   Bevelyn Buckles 07/05/2023, 1:54 PM

## 2023-07-05 NOTE — TOC Initial Note (Addendum)
 Transition of Care [facility]) - Initial/Assessment Note    Patient Details  Name: Allison Novak MRN: [id number] Date of Birth: [birth year]  [facility] CM/SW Contact:    Kermit Balo, RN Phone Number: 07/05/2023, 4:08 PM  Clinical Narrative:                  Pt lives alone but her son is there everyday. Her daughter is at her home most days.  Daughter manages her medications and provides needed transportation.  Daughter says they can increase time at pts home.  Recommendations are for outpatient therapy. Pt and daughter prefer to start with home health. They have no preference for [facility]. CM has arranged HH with [facility]. They will contact the daughter for home visits.  Family will transport home when medically ready.   Expected Discharge Plan: Home w Home Health Services Barriers to Discharge: Continued Medical Work up   Patient Goals and CMS Choice   CMS Medicare.gov Compare Post Acute Care list provided to:: Patient Choice offered to / list presented to : Patient, Adult Children      Expected Discharge Plan and Services   Discharge Planning Services: CM Consult Post Acute Care Choice: Home Health Living arrangements for the past 2 months: Single Family Home                           HH Arranged: PT HH Agency: [facility] Date [facility] Agency Contacted: 07/05/23   Representative spoke with at [facility] Agency: Haywood Lasso  Prior Living Arrangements/Services Living arrangements for the past 2 months: Single Family Home Lives with:: Self Patient language and need for interpreter reviewed:: Yes Do you feel safe going back to the place where you live?: Yes        Care giver support system in place?: Yes (comment) Current home services: DME (walker/ shower seat/ rails) Criminal Activity/Legal Involvement Pertinent to Current Situation/Hospitalization: No - Comment as needed  Activities of Daily Living   ADL Screening (condition at time of admission) Independently  performs ADLs?: Yes (appropriate for developmental age) Is the patient deaf or have difficulty hearing?: Yes Does the patient have difficulty seeing, even when wearing glasses/contacts?: No Does the patient have difficulty concentrating, remembering, or making decisions?: No  Permission Sought/Granted                  Emotional Assessment Appearance:: Appears stated age Attitude/Demeanor/Rapport: Engaged Affect (typically observed): Accepting Orientation: : Oriented to Self, Oriented to Place, Oriented to  Time, Oriented to Situation   Psych Involvement: No (comment)  Admission diagnosis:  Weakness of left upper extremity [R29.898] Syncope, unspecified syncope type [R55] Patient Active Problem List   Diagnosis Date Noted   Syncope 07/05/2023   Paroxysmal atrial fibrillation (HCC) 07/05/2023   Chronic systolic CHF (congestive heart failure) (HCC) 07/05/2023   Weakness of left upper extremity 07/04/2023   CCC (chronic calculous cholecystitis) 10/20/2022   Cholecystostomy care (HCC) 09/13/2022   Cholecystitis 08/18/2022   Gout 06/01/2022   Sepsis due to pneumonia (HCC) 04/30/2022   Acute bronchitis due to respiratory syncytial virus (RSV) 04/30/2022   Aspiration pneumonia (HCC) 04/30/2022   CLL (chronic lymphocytic leukemia) (HCC) 01/18/2022   Iron deficiency anemia 07/21/2021   Moderate pulmonary hypertension (HCC) 07/20/2021   Sepsis (HCC) 07/19/2021   Thrombocytopenia (HCC) 07/19/2021   Acute urinary retention 07/18/2021   History of pulmonary embolism 07/18/2021   Hypothermia 07/18/2021   Pleural effusion  on right 07/18/2021   Chronic diastolic CHF (congestive heart failure) (HCC) 07/17/2021   HLD (hyperlipidemia) 07/17/2021   Pulmonary embolism (HCC) 07/17/2021   Stage 3a chronic kidney disease (CKD) (HCC) 07/17/2021   Acquired hypothyroidism 07/17/2021   Acute respiratory failure with hypoxia (HCC) 07/17/2021   Acquired thrombophilia (HCC) 06/22/2021   Anemia of  chronic disease 06/07/2021   Permanent atrial fibrillation (HCC) 06/07/2021   Hyponatremia 06/07/2021   Acute on chronic combined systolic and diastolic CHF (congestive heart failure) (HCC) 06/07/2021   Atrial fibrillation, chronic (HCC) 08/04/2020   Acute exacerbation of CHF (congestive heart failure) (HCC) 08/04/2020   Shortness of breath 08/03/2020   Hypokalemia    Palpitations    Hyperlipidemia 02/16/2020   Atrial fibrillation with RVR (HCC) 02/16/2020   Hypoxia 02/16/2020   Acute CHF (congestive heart failure) (HCC) 02/16/2020   Chronic anticoagulation 02/16/2020   Senile purpura (HCC) 11/21/2019   Nodule of upper lobe of left lung 10/30/2017   History of TIA (transient ischemic attack) 02/23/2017   Carotid artery disease (HCC) 02/23/2017   TIA (transient ischemic attack) 02/18/2017   Hiatal hernia with GERD without esophagitis 02/18/2017   History of nonmelanoma skin cancer 08/10/2015   Gallstones 06/30/2015   Chest pain 06/23/2015   Essential hypertension 06/16/2015   Aortic atherosclerosis (HCC) 06/16/2015   Diverticulosis of large intestine without hemorrhage 06/16/2015   Age-related osteoporosis without current pathological fracture 10/22/2013   Chronic ankle pain 06/28/2011   PCP:  Lynnea Ferrier, MD Pharmacy:   [id number] [place] Rough, [place] - [address] AT [facility] [address] [place] [address] Phone: [phone] Fax: [phone]     Social Drivers of Health (SDOH) Social History: SDOH Screenings   Food Insecurity: No Food Insecurity (07/05/2023)  Housing: High Risk (07/05/2023)  Transportation Needs: No Transportation Needs (07/05/2023)  Utilities: Not At Risk (07/05/2023)  Financial Resource Strain: Low Risk  (01/24/2023)   Received from [facility]  Social Connections: Socially Isolated (07/05/2023)  Tobacco Use: Low Risk  (07/05/2023)   SDOH Interventions:     Readmission  Risk Interventions    08/19/2022   11:29 AM  Readmission Risk Prevention Plan  Transportation Screening Complete  PCP or Specialist Appt within 3-5 Days Complete  Social Work Consult for Recovery Care Planning/Counseling Complete  Palliative Care Screening Not Applicable  Medication Review Oceanographer) Complete

## 2023-07-05 NOTE — Progress Notes (Signed)
 [facility] PROGRESS NOTE   Allison Novak ZOX:[id number] DOB: 1926/02/04 DOA: 07/04/2023  PCP: Lynnea Ferrier, MD  Brief History:  89 y.o. female with medical history significant for paroxysmal atrial fibrillation chronically anticoagulated on Eliquis, chronic right-sided systolic heart failure hypertension, hyperlipidemia, who was admitted to [facility] on 07/04/2023 with syncope after presenting from home to [facility] ED complaining of loss of consciousness.  There was some concern for slurred speech along with left upper extremity weakness.  She was hospitalized for further management.  Consultants: Neurology  Procedures: None    Subjective/Interval History: Patient is slow to respond to questions but does not server appropriately.  Her daughter is at the bedside.  Patient denies any weakness in her arms currently.  No weakness in the legs either.      Assessment/Plan:  Syncope with concern for left-sided weakness and slurred speech Patient's neurological symptoms resolved.  MRI brain did not show any stroke. Echocardiogram is pending.  EEG did not show any epileptiform activity. Neurology think that this could be reflective of progression of her dementia.  Labs have been ordered. PT and OT evaluation. Neurology feels that patient is more confused this morning compared to yesterday. UA reviewed reviewed.  Not particularly significant for UTI.  TSH is normal.  B12 level is fine and 44.  Ammonia level is normal.  Urine drug screen was unremarkable.  WBC is normal.  Abnormal chest x-ray Retrocardiac opacity noted on chest x-ray.  Patchy opacities noted incidentally on CT scan as well.  Patient without any respiratory symptoms.  Noted to be afebrile.  Check for COVID-19 influenza and RSV.  Continue to monitor for now.  Paroxysmal atrial fibrillation Noted to be anticoagulated with Eliquis.  Continue for now.  Noted to be on metoprolol and digoxin.  Will check a dig  level as well.  Hyperlipidemia Continue statin.  Hypothyroidism Continue levothyroxine.  TSH normal.  DVT Prophylaxis: On Eliquis Code Status: DNR Family Communication: Discussed with patient and her daughter Disposition Plan: Hopefully return home when improved  Status is: Observation The patient will require care spanning > 2 midnights and should be moved to inpatient because: Physical deconditioning, altered mental status      Medications: Scheduled:  apixaban  2.5 mg Oral BID   atorvastatin  40 mg Oral Daily   digoxin  0.0625 mg Oral Daily   levothyroxine  25 mcg Oral Daily   metoprolol succinate  100 mg Oral QPC supper   Continuous: WJX:[id number] **OR** acetaminophen, melatonin, ondansetron (ZOFRAN) IV  Antibiotics: Anti-infectives (From admission, onward)    None       Objective:  Vital Signs  Vitals:   07/05/23 0700 07/05/23 0730 07/05/23 0800 07/05/23 0830  BP: (!) 106/52 120/71 125/69 (!) 141/65  Pulse: 74 78 75 86  Resp: 10 19 15 16   Temp:      TempSrc:      SpO2: 92% 91% 96% 94%  Weight:       No intake or output data in the 24 hours ending 07/05/23 1012 Filed Weights   07/04/23 1843  Weight: 61.8 kg    General appearance: Awake alert.  In no distress.  Distracted Resp: Clear to auscultation bilaterally.  Normal effort Cardio: S1-S2 is normal regular.  No S3-S4.  No rubs murmurs or bruit GI: Abdomen is soft.  Nontender nondistended.  Bowel sounds are present normal.  No masses organomegaly Extremities: No edema.  Physical deconditioning is noted Neurologic: No focal  neurological deficits.    Lab Results:  Data Reviewed: I have personally reviewed following labs and reports of the imaging studies  CBC: Recent Labs  Lab 07/04/23 1838 07/04/23 1847 07/04/23 2347  WBC 8.9  --  7.6  NEUTROABS 6.4  --  4.9  HGB 12.6 12.6 11.8*  HCT 38.1 37.0 34.5*  MCV 97.7  --  96.6  PLT 193  --  191    Basic Metabolic Panel: Recent Labs   Lab 07/04/23 1838 07/04/23 1847 07/04/23 2347  NA 135 133* 134*  K 4.8 4.8 4.2  CL 97* 98 97*  CO2 25  --  26  GLUCOSE 111* 107* 110*  BUN 21 24* 20  CREATININE 1.19* 1.30* 1.03*  CALCIUM 9.5  --  9.2  MG  --   --  1.9    GFR: Estimated Creatinine Clearance: 27.5 mL/min (A) (by C-G formula based on SCr of 1.03 mg/dL (H)).  Liver Function Tests: Recent Labs  Lab 07/04/23 1838 07/04/23 2347  AST 28 34  ALT 19 24  ALKPHOS 71 80  BILITOT 0.5 0.8  PROT 6.6 5.9*  ALBUMIN 3.7 3.3*    Recent Labs  Lab 07/05/23 0853  AMMONIA 17    Coagulation Profile: Recent Labs  Lab 07/04/23 1838  INR 1.2     HbA1C: Recent Labs    07/04/23 2341  HGBA1C 5.3    CBG: Recent Labs  Lab 07/04/23 1839  GLUCAP 117*    Lipid Profile: Recent Labs    07/04/23 2347  CHOL 143  HDL 51  LDLCALC 84  TRIG 38  CHOLHDL 2.8    Thyroid Function Tests: Recent Labs    07/05/23 0623  TSH 2.648     Radiology Studies: EEG adult Result Date: 07/05/2023 Charlsie Quest, MD     07/05/2023  8:59 AM Patient Name: Allison Novak MRN: [id number] Epilepsy Attending: Charlsie Quest Referring Physician/Provider: Gordy Councilman, MD Date: 07/04/2023 Duration: 24.33 mins Patient history: 60y F with ans episode of LOC. EEG to evaluate for seizure Level of alertness: Awake, asleep AEDs during EEG study: Technical aspects: This EEG study was done with scalp electrodes positioned according to the 10-20 International system of electrode placement. Electrical activity was reviewed with band pass filter of 1-70Hz , sensitivity of 7 uV/mm, display speed of 67mm/sec with a 60Hz  notched filter applied as appropriate. EEG data were recorded continuously and digitally stored.  Video monitoring was available and reviewed as appropriate. Description: The posterior dominant rhythm consists of 7.5 Hz activity of moderate voltage (25-35 uV) seen predominantly in posterior head regions, symmetric and reactive  to eye opening and eye closing. Sleep was characterized by vertex waves, sleep spindles (12 to 14 Hz), maximal frontocentral region. EEG showed intermittent generalized 3 to 6 Hz theta-delta slowing. Hyperventilation and photic stimulation were not performed.   ABNORMALITY - Intermittent slow, generalized IMPRESSION: This study is suggestive of mild diffuse encephalopathy. No seizures or epileptiform discharges were seen throughout the recording. Charlsie Quest   DG Chest Port 1 View Result Date: 07/05/2023 CLINICAL DATA:  [id number] Syncope [id number] EXAM: PORTABLE CHEST 1 VIEW COMPARISON:  Chest x-ray 04/30/2022 FINDINGS: The heart and mediastinal contours are unchanged. Persistent right hilar fullness likely due to patient rotation. Atherosclerotic plaque. Interval development of retrocardiac airspace opacity. Chronic coarsened insert markings with no overt pulmonary edema. Trace right pleural effusion. No pneumothorax. No acute osseous abnormality. IMPRESSION: 1. Interval development of retrocardiac airspace opacity. 2. Trace right  pleural effusion. 3.  Aortic Atherosclerosis (ICD10-I70.0). Electronically Signed   By: Tish Frederickson M.D.   On: 07/05/2023 06:47   MR BRAIN W WO CONTRAST Result Date: 07/04/2023 CLINICAL DATA:  Initial evaluation for acute mental status change, unknown cause. EXAM: MRI HEAD WITHOUT AND WITH CONTRAST TECHNIQUE: Multiplanar, multiecho pulse sequences of the brain and surrounding structures were obtained without and with intravenous contrast. CONTRAST:  5mL GADAVIST GADOBUTROL 1 MMOL/ML IV SOLN COMPARISON:  CTs from earlier the same day. FINDINGS: Brain: Generalized age-related cerebral atrophy. Confluent and hazy T2/FLAIR signal abnormality involving the periventricular and deep white matter both cerebral hemispheres as well as the pons, most characteristic of chronic microvascular ischemic disease, moderately advanced for age. No abnormal foci of restricted diffusion to suggest  acute or subacute ischemia. No made of an apparent small focus of diffusion signal abnormality overlying the left cerebral convexity on axial DWI sequence (series 2, image 43), not seen on corresponding sequences, and favored to be artifactual. No areas of chronic cortical infarction. No acute or chronic intracranial blood products. No mass lesion, midline shift or mass effect. Ventricular prominence related global parenchymal volume loss without hydrocephalus. No extra-axial fluid collection. Pituitary gland and suprasellar region within normal limits. No intrinsic temporal lobe abnormality. No abnormal enhancement. Vascular: Major intracranial vascular flow voids are maintained. Skull and upper cervical spine: Craniocervical junction within normal limits. Bone marrow signal intensity normal. No scalp soft tissue abnormality. Sinuses/Orbits: Prior bilateral ocular lens replacement. Paranasal sinuses are largely clear. No significant mastoid effusion. Other: None. IMPRESSION: 1. No acute intracranial abnormality. 2. Age-related cerebral atrophy with moderately advanced chronic microvascular ischemic disease. Electronically Signed   By: Rise Mu M.D.   On: 07/04/2023 22:42   CT ANGIO HEAD NECK W WO CM (CODE STROKE) Result Date: 07/04/2023 CLINICAL DATA:  Provided history: Neuro deficit, acute, stroke suspected. EXAM: CT ANGIOGRAPHY HEAD AND NECK WITH AND WITHOUT CONTRAST TECHNIQUE: Multidetector CT imaging of the head and neck was performed using the standard protocol during bolus administration of intravenous contrast. Multiplanar CT image reconstructions and MIPs were obtained to evaluate the vascular anatomy. Carotid stenosis measurements (when applicable) are obtained utilizing NASCET criteria, using the distal internal carotid diameter as the denominator. RADIATION DOSE REDUCTION: This exam was performed according to the departmental dose-optimization program which includes automated exposure  control, adjustment of the mA and/or kV according to patient size and/or use of iterative reconstruction technique. CONTRAST:  75mL OMNIPAQUE IOHEXOL 350 MG/ML SOLN COMPARISON:  Non-contrast head CT performed earlier today 07/04/2023. MRA head 02/19/2017. FINDINGS: CTA NECK FINDINGS Aortic arch: The visualized aortic arch is normal in caliber. The innominate artery origin is excluded from the field of view. No hemodynamically significant stenosis within the visible innominate or proximal subclavian arteries. Right carotid system: CCA and ICA patent within the neck without stenosis. Mild atherosclerotic plaque about the carotid bifurcation. Left carotid system: The common carotid artery origin is incompletely included in the field of view. Within this limitation, the common carotid and internal carotid arteries are patent within the neck without hemodynamically significant stenosis (50% or greater). Atherosclerotic plaque at the CCA origin, about the carotid bifurcation and within the proximal ICA. Vertebral arteries: Codominant and patent within the neck. Atherosclerotic plaque at the left vertebral artery origin with no more than mild stenosis. Nonstenotic atherosclerotic plaque at the right vertebral artery origin. Skeleton: Spondylosis of the cervical and visualized upper thoracic levels. No acute fracture or aggressive osseous lesion. Other neck: Subcentimeter thyroid nodules  multiple thyroid nodules measuring up to 12 mm, not meeting consensus criteria for ultrasound follow-up based on size. No follow-up imaging recommended. Reference: J Am Coll Radiol. 2015 Feb;12(2): 143-50. Upper chest: Partially imaged right pleural effusion. Patchy ground-glass opacity within the imaged portions of the lungs, bilaterally. Review of the MIP images confirms the above findings CTA HEAD FINDINGS Anterior circulation: The intracranial internal carotid arteries are patent. Atherosclerotic plaque within both vessels with no more  than mild stenosis. The M1 middle cerebral arteries are patent. No M2 proximal branch occlusion or high-grade proximal stenosis. The anterior cerebral arteries are patent. Hypoplastic right A1 segment. No intracranial aneurysm is identified. Posterior circulation: The intracranial vertebral arteries are patent. The basilar artery is patent. The posterior cerebral arteries are patent. Posterior communicating arteries are diminutive or absent, bilaterally. Venous sinuses: Within the limitations of contrast timing, no convincing thrombus. Anatomic variants: As described. Review of the MIP images confirms the above findings No emergent large vessel occlusion identified. These results were communicated to Dr. Iver Nestle at 7:11 pmon 2/25/2025by text page via the [facility] messaging system. IMPRESSION: CTA neck: 1. The left common carotid artery origin is incompletely included in the field of view. Within this limitation, the common carotid and internal carotid arteries are patent within the neck without hemodynamically significant stenosis (50% or greater). Atherosclerotic plaque bilaterally, as described. 2. Vertebral arteries patent within the neck with no more than mild atherosclerotic stenosis. 3. Aortic Atherosclerosis (ICD10-I70.0). 4. Partially imaged right pleural effusion. 5. Patchy ground glass pulmonary opacities within the imaged portions of the bilateral lungs. This may reflect edema. However, exclude signs/symptoms that would suggest infection CTA head: 1. No proximal intracranial large vessel occlusion or high-grade proximal arterial stenosis identified. 2. Non-stenotic atherosclerotic plaque within the intracranial internal carotid arteries. Electronically Signed   By: Jackey Loge D.O.   On: 07/04/2023 19:12   CT HEAD CODE STROKE WO CONTRAST Result Date: 07/04/2023 CLINICAL DATA:  Code stroke. Provided history: Neuro deficit, acute, stroke suspected. EXAM: CT HEAD WITHOUT CONTRAST TECHNIQUE: Contiguous axial  images were obtained from the base of the skull through the vertex without intravenous contrast. RADIATION DOSE REDUCTION: This exam was performed according to the departmental dose-optimization program which includes automated exposure control, adjustment of the mA and/or kV according to patient size and/or use of iterative reconstruction technique. COMPARISON:  Non-contrast head CT 04/15/2023.  Brain MRI 02/19/2017. FINDINGS: Brain: Generalized parenchymal atrophy. Unchanged chronic lacunar infarct within the left basal ganglia and anterior limb of left internal capsule. Advanced patchy and ill-defined hypoattenuation elsewhere within the cerebral white matter, nonspecific but compatible with chronic small vessel ischemic disease. There is no acute intracranial hemorrhage. No demarcated cortical infarct. No extra-axial fluid collection. No evidence of an intracranial mass. No midline shift. Vascular: No hyperdense vessel.  Atherosclerotic calcifications. Skull: No calvarial fracture or aggressive osseous lesion. Sinuses/Orbits: No mass or acute finding within the imaged orbits. No significant paranasal sinus disease. ASPECTS (Alberta Stroke Program Early CT Score) - Ganglionic level infarction (caudate, lentiform nuclei, internal capsule, insula, M1-M3 cortex): 7 - Supraganglionic infarction (M4-M6 cortex): 3 Total score (0-10 with 10 being normal): 10 (when discounting a chronic lacunar infarct within the left basal ganglia/anterior limb of left internal capsule). No evidence of an acute intracranial abnormality. These results were communicated to Dr. Iver Nestle At 6:55 pmon 2/25/2025by text page via the [facility] messaging system. IMPRESSION: 1.  No evidence of an acute intracranial abnormality. 2. Unchanged chronic lacunar infarct within the left basal ganglia/anterior  limb of left internal capsule. 3. Background parenchymal atrophy and advanced cerebral white matter chronic small vessel ischemic disease.  Electronically Signed   By: Jackey Loge D.O.   On: 07/04/2023 18:55       LOS: 0 days   Deondre Marinaro Rito Ehrlich  [facility] Pager on [email or web address]  07/05/2023, 10:12 AM

## 2023-07-05 NOTE — Procedures (Signed)
 Patient Name: Allison Novak  MRN: [id number]  Epilepsy Attending: Charlsie Quest  Referring Physician/Provider: Gordy Councilman, MD  Date: 07/04/2023 Duration: 24.33 mins  Patient history: 39y F with ans episode of LOC. EEG to evaluate for seizure  Level of alertness: Awake, asleep  AEDs during EEG study:   Technical aspects: This EEG study was done with scalp electrodes positioned according to the 10-20 International system of electrode placement. Electrical activity was reviewed with band pass filter of 1-70Hz , sensitivity of 7 uV/mm, display speed of 58mm/sec with a 60Hz  notched filter applied as appropriate. EEG data were recorded continuously and digitally stored.  Video monitoring was available and reviewed as appropriate.  Description: The posterior dominant rhythm consists of 7.5 Hz activity of moderate voltage (25-35 uV) seen predominantly in posterior head regions, symmetric and reactive to eye opening and eye closing. Sleep was characterized by vertex waves, sleep spindles (12 to 14 Hz), maximal frontocentral region. EEG showed intermittent generalized 3 to 6 Hz theta-delta slowing. Hyperventilation and photic stimulation were not performed.     ABNORMALITY - Intermittent slow, generalized  IMPRESSION: This study is suggestive of mild diffuse encephalopathy. No seizures or epileptiform discharges were seen throughout the recording.  Allison Novak

## 2023-07-05 NOTE — Care Management Obs Status (Signed)
 MEDICARE OBSERVATION STATUS NOTIFICATION   Patient Details  Name: Allison Novak MRN: [id number] Date of Birth: 1925/09/10   Medicare Observation Status Notification Given:  Yes    Kermit Balo, RN 07/05/2023, 3:33 PM

## 2023-07-05 NOTE — Plan of Care (Signed)
   Problem: Education: Goal: Knowledge of disease or condition will improve Outcome: Progressing

## 2023-07-06 DIAGNOSIS — R55 Syncope and collapse: Secondary | ICD-10-CM | POA: Diagnosis not present

## 2023-07-06 LAB — BASIC METABOLIC PANEL
Anion gap: 9 (ref 5–15)
BUN: 23 mg/dL (ref 8–23)
CO2: 24 mmol/L (ref 22–32)
Calcium: 8.6 mg/dL — ABNORMAL LOW (ref 8.9–10.3)
Chloride: 101 mmol/L (ref 98–111)
Creatinine, Ser: 1.17 mg/dL — ABNORMAL HIGH (ref 0.44–1.00)
GFR, Estimated: 43 mL/min — ABNORMAL LOW (ref >60)
Glucose, Bld: 87 mg/dL (ref 70–99)
Potassium: 4 mmol/L (ref 3.5–5.1)
Sodium: 134 mmol/L — ABNORMAL LOW (ref 135–145)

## 2023-07-06 LAB — CBC
HCT: 31.4 % — ABNORMAL LOW (ref 36.0–46.0)
Hemoglobin: 10.9 g/dL — ABNORMAL LOW (ref 12.0–15.0)
MCH: 33.1 pg (ref 26.0–34.0)
MCHC: 34.7 g/dL (ref 30.0–36.0)
MCV: 95.4 fL (ref 80.0–100.0)
Platelets: 162 10*3/uL (ref 150–400)
RBC: 3.29 MIL/uL — ABNORMAL LOW (ref 3.87–5.11)
RDW: 13.2 % (ref 11.5–15.5)
WBC: 7.6 10*3/uL (ref 4.0–10.5)
nRBC: 0 % (ref 0.0–0.2)

## 2023-07-06 NOTE — Discharge Instructions (Signed)

## 2023-07-06 NOTE — Plan of Care (Signed)

## 2023-07-06 NOTE — Progress Notes (Signed)
 Reviewed AVS, patient expressed understanding of medications, MD follow up reviewed.   Patient states all belongings brought to the hospital at time of admission are accounted for and packed to take home.   Pt transported to entrance A where family member was waiting in vehicle to transport home.

## 2023-07-06 NOTE — Discharge Summary (Signed)
 [facility]  Physician Discharge Summary   Patient ID: Allison Novak MRN: [id number] DOB/AGE: [birth year] 89 y.o.  Admit date: 07/04/2023 Discharge date: 07/06/2023    PCP: Lynnea Ferrier, MD  DISCHARGE DIAGNOSES:    Syncope   HLD (hyperlipidemia)   Acquired hypothyroidism   Paroxysmal atrial fibrillation (HCC)   Chronic systolic CHF (congestive heart failure) (HCC)   RECOMMENDATIONS FOR OUTPATIENT FOLLOW UP: Outpatient follow-up with primary care and to discuss referral to neurology for neurocognitive testing.   Home Health: PT OT Equipment/Devices: None  CODE STATUS: DNR  DISCHARGE CONDITION: fair  Diet recommendation: As before  INITIAL HISTORY:  89 y.o. female with medical history significant for paroxysmal atrial fibrillation chronically anticoagulated on Eliquis, chronic right-sided systolic heart failure hypertension, hyperlipidemia, who was admitted to [facility] on 07/04/2023 with syncope after presenting from home to [facility] ED complaining of loss of consciousness.  There was some concern for slurred speech along with left upper extremity weakness.  She was hospitalized for further management.   Consultants: Neurology   Procedures: None    HOSPITAL COURSE:   Syncope with concern for left-sided weakness and slurred speech Patient's neurological symptoms resolved.  MRI brain did not show any stroke. Echocardiogram showed normal LVEF.  EEG did not show any epileptiform activity. Neurology think that this could be reflective of progression of her dementia.  Metabolic workup was unremarkable.  Thiamine level is pending.   Neurology recommends outpatient neurocognitive testing which can be arranged by outpatient providers. UA reviewed reviewed.  Not particularly significant for UTI.  TSH is normal.  B12 level is fine and 44.  Ammonia level is normal.  Urine drug screen was unremarkable.  WBC is normal.   Abnormal chest x-ray Retrocardiac opacity  noted on chest x-ray.  Patchy opacities noted incidentally on CT scan as well.  Patient without any respiratory symptoms.  Noted to be afebrile.  COVID-19, RSV and influenza PCR negative.  Patient remains afebrile with normal WBC.  No indication to initiate antibiotics currently.   Paroxysmal atrial fibrillation Noted to be anticoagulated with Eliquis.  Digoxin level was normal.   Hyperlipidemia Continue statin.   Hypothyroidism Continue levothyroxine.  TSH normal.  Patient is stable.  Okay for discharge home today.  Discussed with the daughter.  PERTINENT LABS:  The results of significant diagnostics from this hospitalization (including imaging, microbiology, ancillary and laboratory) are listed below for reference.    Microbiology: Recent Results (from the past 240 hours)  Resp panel by RT-PCR (RSV, Flu A&B, Covid) Anterior Nasal Swab     Status: None   Collection Time: 07/05/23 10:37 AM   Specimen: Anterior Nasal Swab  Result Value Ref Range Status   SARS Coronavirus 2 by RT PCR NEGATIVE NEGATIVE Final   Influenza A by PCR NEGATIVE NEGATIVE Final   Influenza B by PCR NEGATIVE NEGATIVE Final    Comment: (NOTE) The Xpert Xpress SARS-CoV-2/FLU/RSV plus assay is intended as an aid in the diagnosis of influenza from Nasopharyngeal swab specimens and should not be used as a sole basis for treatment. Nasal washings and aspirates are unacceptable for Xpert Xpress SARS-CoV-2/FLU/RSV testing.  Fact Sheet for Patients: BloggerCourse.com  Fact Sheet for Healthcare Providers: SeriousBroker.it  This test is not yet approved or cleared by the [place] FDA and has been authorized for detection and/or diagnosis of SARS-CoV-2 by FDA under an Emergency Use Authorization (EUA). This EUA will remain in effect (meaning this test can be used) for the  duration of the COVID-19 declaration under Section 564(b)(1) of the Act, 21 U.S.C. section  360bbb-3(b)(1), unless the authorization is terminated or revoked.     Resp Syncytial Virus by PCR NEGATIVE NEGATIVE Final    Comment: (NOTE) Fact Sheet for Patients: BloggerCourse.com  Fact Sheet for Healthcare Providers: SeriousBroker.it  This test is not yet approved or cleared by the [place] FDA and has been authorized for detection and/or diagnosis of SARS-CoV-2 by FDA under an Emergency Use Authorization (EUA). This EUA will remain in effect (meaning this test can be used) for the duration of the COVID-19 declaration under Section 564(b)(1) of the Act, 21 U.S.C. section 360bbb-3(b)(1), unless the authorization is terminated or revoked.  Performed at [facility], [address], [place], [place] [address]      Labs:   Basic Metabolic Panel: Recent Labs  Lab 07/04/23 1838 07/04/23 1847 07/04/23 2347 07/06/23 0645  NA 135 133* 134* 134*  K 4.8 4.8 4.2 4.0  CL 97* 98 97* 101  CO2 25  --  26 24  GLUCOSE 111* 107* 110* 87  BUN 21 24* 20 23  CREATININE 1.19* 1.30* 1.03* 1.17*  CALCIUM 9.5  --  9.2 8.6*  MG  --   --  1.9  --    Liver Function Tests: Recent Labs  Lab 07/04/23 1838 07/04/23 2347  AST 28 34  ALT 19 24  ALKPHOS 71 80  BILITOT 0.5 0.8  PROT 6.6 5.9*  ALBUMIN 3.7 3.3*    Recent Labs  Lab 07/05/23 0853  AMMONIA 17   CBC: Recent Labs  Lab 07/04/23 1838 07/04/23 1847 07/04/23 2347 07/06/23 0645  WBC 8.9  --  7.6 7.6  NEUTROABS 6.4  --  4.9  --   HGB 12.6 12.6 11.8* 10.9*  HCT 38.1 37.0 34.5* 31.4*  MCV 97.7  --  96.6 95.4  PLT 193  --  191 162    CBG: Recent Labs  Lab 07/04/23 1839  GLUCAP 117*     IMAGING STUDIES ECHOCARDIOGRAM COMPLETE Result Date: 07/05/2023    ECHOCARDIOGRAM REPORT   Patient Name:   CAITRIN PENDERGRAPH Date of Exam: 07/05/2023 Medical Rec #:  [id number]       Height:       61.0 in Accession #:    [id number]      Weight:       136.2 lb Date of  Birth:  1925/09/16       BSA:          1.604 m Patient Age:    89 years        BP:           127/76 mmHg Patient Gender: F               HR:           86 bpm. Exam Location:  Inpatient Procedure: 2D Echo (Both Spectral and Color Flow Doppler were utilized during            procedure). Indications:    Syncope R55  History:        Patient has prior history of Echocardiogram examinations, most                 recent 07/27/2021. CHF, Carotid Disease, Arrythmias:Atrial                 Fibrillation; Risk Factors:Hypertension, Dyslipidemia and  Non-Smoker.  Sonographer:    Dondra Prader RVT RCS Referring Phys: [id number] JUSTIN B HOWERTER IMPRESSIONS  1. Left ventricular ejection fraction, by estimation, is 55 to 60%. The left ventricle has normal function. The left ventricle has no regional wall motion abnormalities. There is mild concentric left ventricular hypertrophy. Left ventricular diastolic parameters are indeterminate.  2. Right ventricular systolic function is mildly reduced. The right ventricular size is normal.  3. Left atrial size was severely dilated.  4. Right atrial size was moderately dilated.  5. The mitral valve is degenerative. Moderate mitral valve regurgitation. No evidence of mitral stenosis. Moderate mitral annular calcification.  6. Tricuspid valve regurgitation is moderate.  7. The aortic valve is normal in structure. There is mild calcification of the aortic valve. Aortic valve regurgitation is not visualized. Aortic valve sclerosis/calcification is present, without any evidence of aortic stenosis.  8. The inferior vena cava is dilated in size with <50% respiratory variability, suggesting right atrial pressure of 15 mmHg. FINDINGS  Left Ventricle: Left ventricular ejection fraction, by estimation, is 55 to 60%. The left ventricle has normal function. The left ventricle has no regional wall motion abnormalities. Strain imaging was not performed. The left ventricular internal cavity  size  was normal in size. There is mild concentric left ventricular hypertrophy. Left ventricular diastolic parameters are indeterminate. Right Ventricle: The right ventricular size is normal. No increase in right ventricular wall thickness. Right ventricular systolic function is mildly reduced. Left Atrium: Left atrial size was severely dilated. Right Atrium: Right atrial size was moderately dilated. Pericardium: There is no evidence of pericardial effusion. Mitral Valve: The mitral valve is degenerative in appearance. Moderate mitral annular calcification. Moderate mitral valve regurgitation. No evidence of mitral valve stenosis. Tricuspid Valve: The tricuspid valve is normal in structure. Tricuspid valve regurgitation is moderate . No evidence of tricuspid stenosis. Aortic Valve: The aortic valve is normal in structure. There is mild calcification of the aortic valve. Aortic valve regurgitation is not visualized. Aortic valve sclerosis/calcification is present, without any evidence of aortic stenosis. Aortic valve mean gradient measures 3.0 mmHg. Aortic valve peak gradient measures 5.0 mmHg. Aortic valve area, by VTI measures 2.31 cm. Pulmonic Valve: The pulmonic valve was normal in structure. Pulmonic valve regurgitation is trivial. No evidence of pulmonic stenosis. Aorta: The aortic root is normal in size and structure. Venous: The inferior vena cava is dilated in size with less than 50% respiratory variability, suggesting right atrial pressure of 15 mmHg. IAS/Shunts: No atrial level shunt detected by color flow Doppler. Additional Comments: 3D imaging was not performed.  LEFT VENTRICLE PLAX 2D LVIDd:         4.10 cm   Diastology LVIDs:         2.60 cm   LV e' medial:  5.40 cm/s LV PW:         1.20 cm   LV e' lateral: 5.51 cm/s LV IVS:        1.10 cm LVOT diam:     2.00 cm LV SV:         48 LV SV Index:   30 LVOT Area:     3.14 cm  RIGHT VENTRICLE            IVC RV Basal diam:  3.70 cm    IVC diam: 1.80 cm RV S  prime:     5.29 cm/s TAPSE (M-mode): 1.0 cm LEFT ATRIUM             Index  RIGHT ATRIUM           Index LA diam:        3.80 cm 2.37 cm/m   RA Area:     19.60 cm LA Vol (A2C):   62.3 ml 38.83 ml/m  RA Volume:   57.30 ml  35.71 ml/m LA Vol (A4C):   43.7 ml 27.24 ml/m LA Biplane Vol: 55.6 ml 34.65 ml/m  AORTIC VALVE                    PULMONIC VALVE AV Area (Vmax):    2.47 cm     PR End Diast Vel: 5.95 msec AV Area (Vmean):   2.44 cm AV Area (VTI):     2.31 cm AV Vmax:           111.33 cm/s AV Vmean:          75.467 cm/s AV VTI:            0.207 m AV Peak Grad:      5.0 mmHg AV Mean Grad:      3.0 mmHg LVOT Vmax:         87.40 cm/s LVOT Vmean:        58.633 cm/s LVOT VTI:          0.152 m LVOT/AV VTI ratio: 0.73  AORTA Ao Root diam: 2.80 cm Ao Asc diam:  3.30 cm TRICUSPID VALVE TR Peak grad:   22.5 mmHg TR Vmax:        237.00 cm/s  SHUNTS Systemic VTI:  0.15 m Systemic Diam: 2.00 cm Arvilla Meres MD Electronically signed by Arvilla Meres MD Signature Date/Time: 07/05/2023/11:44:56 AM    Final    EEG adult Result Date: 07/05/2023 Charlsie Quest, MD     07/05/2023  8:59 AM Patient Name: Narda Rutherford MRN: [id number] Epilepsy Attending: Charlsie Quest Referring Physician/Provider: Gordy Councilman, MD Date: 07/04/2023 Duration: 24.33 mins Patient history: 56y F with ans episode of LOC. EEG to evaluate for seizure Level of alertness: Awake, asleep AEDs during EEG study: Technical aspects: This EEG study was done with scalp electrodes positioned according to the 10-20 International system of electrode placement. Electrical activity was reviewed with band pass filter of 1-70Hz , sensitivity of 7 uV/mm, display speed of 40mm/sec with a 60Hz  notched filter applied as appropriate. EEG data were recorded continuously and digitally stored.  Video monitoring was available and reviewed as appropriate. Description: The posterior dominant rhythm consists of 7.5 Hz activity of moderate voltage (25-35 uV)  seen predominantly in posterior head regions, symmetric and reactive to eye opening and eye closing. Sleep was characterized by vertex waves, sleep spindles (12 to 14 Hz), maximal frontocentral region. EEG showed intermittent generalized 3 to 6 Hz theta-delta slowing. Hyperventilation and photic stimulation were not performed.   ABNORMALITY - Intermittent slow, generalized IMPRESSION: This study is suggestive of mild diffuse encephalopathy. No seizures or epileptiform discharges were seen throughout the recording. Charlsie Quest   DG Chest Port 1 View Result Date: 07/05/2023 CLINICAL DATA:  [id number] Syncope [id number] EXAM: PORTABLE CHEST 1 VIEW COMPARISON:  Chest x-ray 04/30/2022 FINDINGS: The heart and mediastinal contours are unchanged. Persistent right hilar fullness likely due to patient rotation. Atherosclerotic plaque. Interval development of retrocardiac airspace opacity. Chronic coarsened insert markings with no overt pulmonary edema. Trace right pleural effusion. No pneumothorax. No acute osseous abnormality. IMPRESSION: 1. Interval development of retrocardiac airspace opacity. 2. Trace right pleural effusion. 3.  Aortic Atherosclerosis (  ICD10-I70.0). Electronically Signed   By: Tish Frederickson M.D.   On: 07/05/2023 06:47   MR BRAIN W WO CONTRAST Result Date: 07/04/2023 CLINICAL DATA:  Initial evaluation for acute mental status change, unknown cause. EXAM: MRI HEAD WITHOUT AND WITH CONTRAST TECHNIQUE: Multiplanar, multiecho pulse sequences of the brain and surrounding structures were obtained without and with intravenous contrast. CONTRAST:  5mL GADAVIST GADOBUTROL 1 MMOL/ML IV SOLN COMPARISON:  CTs from earlier the same day. FINDINGS: Brain: Generalized age-related cerebral atrophy. Confluent and hazy T2/FLAIR signal abnormality involving the periventricular and deep white matter both cerebral hemispheres as well as the pons, most characteristic of chronic microvascular ischemic disease, moderately  advanced for age. No abnormal foci of restricted diffusion to suggest acute or subacute ischemia. No made of an apparent small focus of diffusion signal abnormality overlying the left cerebral convexity on axial DWI sequence (series 2, image 43), not seen on corresponding sequences, and favored to be artifactual. No areas of chronic cortical infarction. No acute or chronic intracranial blood products. No mass lesion, midline shift or mass effect. Ventricular prominence related global parenchymal volume loss without hydrocephalus. No extra-axial fluid collection. Pituitary gland and suprasellar region within normal limits. No intrinsic temporal lobe abnormality. No abnormal enhancement. Vascular: Major intracranial vascular flow voids are maintained. Skull and upper cervical spine: Craniocervical junction within normal limits. Bone marrow signal intensity normal. No scalp soft tissue abnormality. Sinuses/Orbits: Prior bilateral ocular lens replacement. Paranasal sinuses are largely clear. No significant mastoid effusion. Other: None. IMPRESSION: 1. No acute intracranial abnormality. 2. Age-related cerebral atrophy with moderately advanced chronic microvascular ischemic disease. Electronically Signed   By: Rise Mu M.D.   On: 07/04/2023 22:42   CT ANGIO HEAD NECK W WO CM (CODE STROKE) Result Date: 07/04/2023 CLINICAL DATA:  Provided history: Neuro deficit, acute, stroke suspected. EXAM: CT ANGIOGRAPHY HEAD AND NECK WITH AND WITHOUT CONTRAST TECHNIQUE: Multidetector CT imaging of the head and neck was performed using the standard protocol during bolus administration of intravenous contrast. Multiplanar CT image reconstructions and MIPs were obtained to evaluate the vascular anatomy. Carotid stenosis measurements (when applicable) are obtained utilizing NASCET criteria, using the distal internal carotid diameter as the denominator. RADIATION DOSE REDUCTION: This exam was performed according to the  departmental dose-optimization program which includes automated exposure control, adjustment of the mA and/or kV according to patient size and/or use of iterative reconstruction technique. CONTRAST:  75mL OMNIPAQUE IOHEXOL 350 MG/ML SOLN COMPARISON:  Non-contrast head CT performed earlier today 07/04/2023. MRA head 02/19/2017. FINDINGS: CTA NECK FINDINGS Aortic arch: The visualized aortic arch is normal in caliber. The innominate artery origin is excluded from the field of view. No hemodynamically significant stenosis within the visible innominate or proximal subclavian arteries. Right carotid system: CCA and ICA patent within the neck without stenosis. Mild atherosclerotic plaque about the carotid bifurcation. Left carotid system: The common carotid artery origin is incompletely included in the field of view. Within this limitation, the common carotid and internal carotid arteries are patent within the neck without hemodynamically significant stenosis (50% or greater). Atherosclerotic plaque at the CCA origin, about the carotid bifurcation and within the proximal ICA. Vertebral arteries: Codominant and patent within the neck. Atherosclerotic plaque at the left vertebral artery origin with no more than mild stenosis. Nonstenotic atherosclerotic plaque at the right vertebral artery origin. Skeleton: Spondylosis of the cervical and visualized upper thoracic levels. No acute fracture or aggressive osseous lesion. Other neck: Subcentimeter thyroid nodules multiple thyroid nodules measuring up to  12 mm, not meeting consensus criteria for ultrasound follow-up based on size. No follow-up imaging recommended. Reference: J Am Coll Radiol. 2015 Feb;12(2): 143-50. Upper chest: Partially imaged right pleural effusion. Patchy ground-glass opacity within the imaged portions of the lungs, bilaterally. Review of the MIP images confirms the above findings CTA HEAD FINDINGS Anterior circulation: The intracranial internal carotid  arteries are patent. Atherosclerotic plaque within both vessels with no more than mild stenosis. The M1 middle cerebral arteries are patent. No M2 proximal branch occlusion or high-grade proximal stenosis. The anterior cerebral arteries are patent. Hypoplastic right A1 segment. No intracranial aneurysm is identified. Posterior circulation: The intracranial vertebral arteries are patent. The basilar artery is patent. The posterior cerebral arteries are patent. Posterior communicating arteries are diminutive or absent, bilaterally. Venous sinuses: Within the limitations of contrast timing, no convincing thrombus. Anatomic variants: As described. Review of the MIP images confirms the above findings No emergent large vessel occlusion identified. These results were communicated to Dr. Iver Nestle at 7:11 pmon 2/25/2025by text page via the [facility] messaging system. IMPRESSION: CTA neck: 1. The left common carotid artery origin is incompletely included in the field of view. Within this limitation, the common carotid and internal carotid arteries are patent within the neck without hemodynamically significant stenosis (50% or greater). Atherosclerotic plaque bilaterally, as described. 2. Vertebral arteries patent within the neck with no more than mild atherosclerotic stenosis. 3. Aortic Atherosclerosis (ICD10-I70.0). 4. Partially imaged right pleural effusion. 5. Patchy ground glass pulmonary opacities within the imaged portions of the bilateral lungs. This may reflect edema. However, exclude signs/symptoms that would suggest infection CTA head: 1. No proximal intracranial large vessel occlusion or high-grade proximal arterial stenosis identified. 2. Non-stenotic atherosclerotic plaque within the intracranial internal carotid arteries. Electronically Signed   By: Jackey Loge D.O.   On: 07/04/2023 19:12   CT HEAD CODE STROKE WO CONTRAST Result Date: 07/04/2023 CLINICAL DATA:  Code stroke. Provided history: Neuro deficit, acute,  stroke suspected. EXAM: CT HEAD WITHOUT CONTRAST TECHNIQUE: Contiguous axial images were obtained from the base of the skull through the vertex without intravenous contrast. RADIATION DOSE REDUCTION: This exam was performed according to the departmental dose-optimization program which includes automated exposure control, adjustment of the mA and/or kV according to patient size and/or use of iterative reconstruction technique. COMPARISON:  Non-contrast head CT 04/15/2023.  Brain MRI 02/19/2017. FINDINGS: Brain: Generalized parenchymal atrophy. Unchanged chronic lacunar infarct within the left basal ganglia and anterior limb of left internal capsule. Advanced patchy and ill-defined hypoattenuation elsewhere within the cerebral white matter, nonspecific but compatible with chronic small vessel ischemic disease. There is no acute intracranial hemorrhage. No demarcated cortical infarct. No extra-axial fluid collection. No evidence of an intracranial mass. No midline shift. Vascular: No hyperdense vessel.  Atherosclerotic calcifications. Skull: No calvarial fracture or aggressive osseous lesion. Sinuses/Orbits: No mass or acute finding within the imaged orbits. No significant paranasal sinus disease. ASPECTS ([place] Stroke Program Early CT Score) - Ganglionic level infarction (caudate, lentiform nuclei, internal capsule, insula, M1-M3 cortex): 7 - Supraganglionic infarction (M4-M6 cortex): 3 Total score (0-10 with 10 being normal): 10 (when discounting a chronic lacunar infarct within the left basal ganglia/anterior limb of left internal capsule). No evidence of an acute intracranial abnormality. These results were communicated to Dr. Iver Nestle At 6:55 pmon 2/25/2025by text page via the [facility] messaging system. IMPRESSION: 1.  No evidence of an acute intracranial abnormality. 2. Unchanged chronic lacunar infarct within the left basal ganglia/anterior limb of left internal capsule. 3.  Background parenchymal atrophy and  advanced cerebral white matter chronic small vessel ischemic disease. Electronically Signed   By: Jackey Loge D.O.   On: 07/04/2023 18:55    DISCHARGE EXAMINATION: Vitals:   07/06/23 0500 07/06/23 0819 07/06/23 1000 07/06/23 1144  BP:  (!) 107/55  126/66  Pulse:  90  71  Resp:  16  16  Temp:  100.2 F (37.9 C) 98.4 F (36.9 C) 99.8 F (37.7 C)  TempSrc:  Oral Oral Oral  SpO2:  92%  (!) 77%  Weight: 58.6 kg     Height:       General appearance: Awake alert.  In no distress Resp: Clear to auscultation bilaterally.  Normal effort Cardio: S1-S2 is normal regular.  No S3-S4.  No rubs murmurs or bruit GI: Abdomen is soft.  Nontender nondistended.  Bowel sounds are present normal.  No masses organomegaly   DISPOSITION: Home  Discharge Instructions     Call MD for:  difficulty breathing, headache or visual disturbances   Complete by: As directed    Call MD for:  extreme fatigue   Complete by: As directed    Call MD for:  persistant dizziness or light-headedness   Complete by: As directed    Call MD for:  persistant nausea and vomiting   Complete by: As directed    Call MD for:  severe uncontrolled pain   Complete by: As directed    Call MD for:  temperature >100.4   Complete by: As directed    Diet - low sodium heart healthy   Complete by: As directed    Discharge instructions   Complete by: As directed    Be sure to follow-up with your primary care provider and discuss referral to neurology.  You were cared for by a hospitalist during your hospital stay. If you have any questions about your discharge medications or the care you received while you were in the hospital after you are discharged, you can call the unit and asked to speak with the hospitalist on call if the hospitalist that took care of you is not available. Once you are discharged, your primary care physician will handle any further medical issues. Please note that NO REFILLS for any discharge medications will be  authorized once you are discharged, as it is imperative that you return to your primary care physician (or establish a relationship with a primary care physician if you do not have one) for your aftercare needs so that they can reassess your need for medications and monitor your lab values. If you do not have a primary care physician, you can call [phone] for a physician referral.   Increase activity slowly   Complete by: As directed          Allergies as of 07/06/2023       Reactions   Keflex [cephalexin] Other (See Comments)   Pt cant remember exact reaction   Macrodantin [nitrofurantoin Macrocrystal] Other (See Comments)        Medication List     TAKE these medications    acetaminophen 325 MG tablet Commonly known as: TYLENOL Take 2 tablets (650 mg total) by mouth every 6 (six) hours as needed for mild pain (or Fever >/= 101).   albuterol 108 (90 Base) MCG/ACT inhaler Commonly known as: VENTOLIN HFA Inhale 2 puffs into the lungs every 6 (six) hours as needed for wheezing or shortness of breath.   allopurinol 100 MG tablet Commonly known as: ZYLOPRIM Take  100 mg by mouth daily.   atorvastatin 40 MG tablet Commonly known as: LIPITOR Take 40 mg by mouth daily.   calcium carbonate 1500 (600 Ca) MG Tabs tablet Commonly known as: OSCAL Take 1 tablet by mouth in the morning and at bedtime.   Cholecalciferol 50 MCG (2000 UT) Caps Take 2,000 Units by mouth daily.   digoxin 0.125 MG tablet Commonly known as: LANOXIN Take 0.0625 mg by mouth daily.   Eliquis 2.5 MG Tabs tablet Generic drug: apixaban Take 2.5 mg by mouth 2 (two) times daily.   esomeprazole 40 MG capsule Commonly known as: NEXIUM Take 40 mg by mouth daily.   levothyroxine 25 MCG tablet Commonly known as: SYNTHROID Take 25 mcg by mouth daily.   metoprolol succinate 100 MG 24 hr tablet Commonly known as: TOPROL-XL Take 100 mg by mouth daily after supper.   Multi-Vitamin tablet Take 1 tablet by  mouth daily.   polyvinyl alcohol 1.4 % ophthalmic solution Commonly known as: LIQUIFILM TEARS Place 2 drops into the left eye every 8 (eight) hours as needed for dry eyes.   potassium chloride 10 MEQ tablet Commonly known as: KLOR-CON Take 10 mEq by mouth daily after supper.          Follow-up Information     [facility] Follow up.   Specialty: Home Health Services Why: The home health agency will contact you for the first home visit. Contact information: [address] [place] [address] [phone]         Curtis Sites III, MD. Schedule an appointment as soon as possible for a visit in 1 week(s).   Specialty: Internal Medicine Why: post hospitalization follow up.  Please discuss referral to neurology. Contact information: [address] [facility] [place] [address] [phone]                 TOTAL DISCHARGE TIME: 35 minutes  Akela Pocius Rito Ehrlich  [facility] Pager on [email or web address]  07/07/2023, 11:52 AM

## 2023-07-06 NOTE — TOC Transition Note (Signed)
 Transition of Care [facility]) - Discharge Note   Patient Details  Name: Allison Novak MRN: [id number] Date of Birth: June 05, 1925  Transition of [facility]) CM/SW Contact:  Kermit Balo, RN Phone Number: 07/06/2023, 10:20 AM   Clinical Narrative:     Pt is discharging home with home health through [place] [facility]. Information on the AVS. Frances Furbish will contact the daughter for the first home visit. Daughter will transport home.  Final next level of care: Home w Home Health Services Barriers to Discharge: No Barriers Identified   Patient Goals and CMS Choice   CMS Medicare.gov Compare Post Acute Care list provided to:: Patient Choice offered to / list presented to : Patient, Adult Children      Discharge Placement                       Discharge Plan and Services Additional resources added to the After Visit Summary for     Discharge Planning Services: CM Consult Post Acute Care Choice: Home Health                    HH Arranged: PT [facility] Agency: [facility] Date [facility] Agency Contacted: 07/05/23   Representative spoke with at [facility] Agency: Haywood Lasso  Social Drivers of Health (SDOH) Interventions SDOH Screenings   Food Insecurity: No Food Insecurity (07/05/2023)  Housing: High Risk (07/05/2023)  Transportation Needs: No Transportation Needs (07/05/2023)  Utilities: Not At Risk (07/05/2023)  Financial Resource Strain: Low Risk  (01/24/2023)   Received from [facility]  Social Connections: Socially Isolated (07/05/2023)  Tobacco Use: Low Risk  (07/05/2023)     Readmission Risk Interventions    08/19/2022   11:29 AM  Readmission Risk Prevention Plan  Transportation Screening Complete  PCP or Specialist Appt within 3-5 Days Complete  Social Work Consult for Recovery Care Planning/Counseling Complete  Palliative Care Screening Not Applicable  Medication Review Oceanographer) Complete

## 2023-07-06 NOTE — Evaluation (Signed)
 SLP Cancellation Note  Patient Details Name: Allison Novak MRN: [id number] DOB: [birth year]   Cancelled treatment:       Reason Eval/Treat Not Completed: Other (comment) (pt to dc today per RN, thus SlP can be ordered for home if needed)  Rolena Infante, MS [facility] SLP Acute Rehab Services Office [phone]   Chales Abrahams 07/06/2023, 11:12 AM

## 2023-07-07 LAB — VITAMIN B1: Vitamin B1 (Thiamine): 146.1 nmol/L (ref 66.5–200.0)

## 2023-07-08 ENCOUNTER — Other Ambulatory Visit: Payer: Self-pay

## 2023-07-08 ENCOUNTER — Emergency Department

## 2023-07-08 ENCOUNTER — Emergency Department
Admission: EM | Admit: 2023-07-08 | Discharge: 2023-07-08 | Disposition: A | Discharge status: Home / Self Care | Attending: Emergency Medicine | Admitting: Emergency Medicine

## 2023-07-08 DIAGNOSIS — I4891 Unspecified atrial fibrillation: Secondary | ICD-10-CM | POA: Diagnosis not present

## 2023-07-08 DIAGNOSIS — I130 Hypertensive heart and chronic kidney disease with heart failure and stage 1 through stage 4 chronic kidney disease, or unspecified chronic kidney disease: Secondary | ICD-10-CM | POA: Insufficient documentation

## 2023-07-08 DIAGNOSIS — I509 Heart failure, unspecified: Secondary | ICD-10-CM | POA: Insufficient documentation

## 2023-07-08 DIAGNOSIS — N189 Chronic kidney disease, unspecified: Secondary | ICD-10-CM | POA: Diagnosis not present

## 2023-07-08 DIAGNOSIS — R55 Syncope and collapse: Secondary | ICD-10-CM | POA: Insufficient documentation

## 2023-07-08 LAB — CBC
HCT: 36.7 % (ref 36.0–46.0)
Hemoglobin: 12 g/dL (ref 12.0–15.0)
MCH: 32.2 pg (ref 26.0–34.0)
MCHC: 32.7 g/dL (ref 30.0–36.0)
MCV: 98.4 fL (ref 80.0–100.0)
Platelets: 198 10*3/uL (ref 150–400)
RBC: 3.73 MIL/uL — ABNORMAL LOW (ref 3.87–5.11)
RDW: 13.1 % (ref 11.5–15.5)
WBC: 7.3 10*3/uL (ref 4.0–10.5)
nRBC: 0 % (ref 0.0–0.2)

## 2023-07-08 LAB — BASIC METABOLIC PANEL
Anion gap: 9 (ref 5–15)
BUN: 30 mg/dL — ABNORMAL HIGH (ref 8–23)
CO2: 26 mmol/L (ref 22–32)
Calcium: 9 mg/dL (ref 8.9–10.3)
Chloride: 101 mmol/L (ref 98–111)
Creatinine, Ser: 0.9 mg/dL (ref 0.44–1.00)
GFR, Estimated: 59 mL/min — ABNORMAL LOW (ref >60)
Glucose, Bld: 87 mg/dL (ref 70–99)
Potassium: 4 mmol/L (ref 3.5–5.1)
Sodium: 136 mmol/L (ref 135–145)

## 2023-07-08 LAB — URINALYSIS, ROUTINE W REFLEX MICROSCOPIC
Bilirubin Urine: NEGATIVE
Glucose, UA: NEGATIVE mg/dL
Hgb urine dipstick: NEGATIVE
Ketones, ur: NEGATIVE mg/dL
Leukocytes,Ua: NEGATIVE
Nitrite: NEGATIVE
Protein, ur: NEGATIVE mg/dL
Specific Gravity, Urine: 1.016 (ref 1.005–1.030)
pH: 5 (ref 5.0–8.0)

## 2023-07-08 NOTE — ED Notes (Signed)
 EDP at bedside

## 2023-07-08 NOTE — ED Triage Notes (Signed)
 Per family, patient was washing her hair in the kitchen sink and had a syncopal episode. Patient was lowered to the floor by family. Per family, patient unconscious for 2-4 minutes.

## 2023-07-08 NOTE — ED Triage Notes (Signed)
 First nurse note: Patient arrived by [facility] from home. Patient was washing hair in sink and had syncopal episode. Family lowered patient to the ground.   EMS reports A&O x4   [facility]  20G Left forearm

## 2023-07-08 NOTE — ED Provider Notes (Signed)
 [facility] Provider Note    Event Date/Time   First MD Initiated Contact with Patient 07/08/23 1306     (approximate)   History   Loss of Consciousness   HPI  Allison Novak is a 89 y.o. female with a history of CHF, CKD, CAD, hypertension, A-fib who presents after a syncopal episode.  Patient was recently hospitalized and evaluated for TIA, she had EEG apparently also performed and her daughter was washing her hair over the kitchen sink which involved her standing and bending over and putting her head underneath the kitchen sink for prolonged period of time.  She became lightheaded and family helped her lie down after which she quickly improved.  She denies chest pain or palpitations.  No nausea vomiting, no diaphoresis.  Feels at her baseline at this time     Physical Exam   Triage Vital Signs: ED Triage Vitals  Encounter Vitals Group     BP 07/08/23 1147 110/69     Systolic BP Percentile --      Diastolic BP Percentile --      Pulse Rate 07/08/23 1147 60     Resp 07/08/23 1147 17     Temp 07/08/23 1147 97.8 F (36.6 C)     Temp Source 07/08/23 1147 Oral     SpO2 07/08/23 1147 98 %     Weight 07/08/23 1154 58.6 kg (129 lb 3 oz)     Height 07/08/23 1154 1.575 m (5\' 2" )     Head Circumference --      Peak Flow --      Pain Score 07/08/23 1154 0     Pain Loc --      Pain Education --      Exclude from Growth Chart --     Most recent vital signs: Vitals:   07/08/23 1147  BP: 110/69  Pulse: 60  Resp: 17  Temp: 97.8 F (36.6 C)  SpO2: 98%     General: Awake, no distress.  CV:  Good peripheral perfusion.  Irregular irregular heart rate, normal rate Resp:  Normal effort.  Clear to auscultation bilaterally Abd:  No distention.  Soft, nontender Other:  Normal strength in all extremities   ED Results / Procedures / Treatments   Labs (all labs ordered are listed, but only abnormal results are displayed) Labs Reviewed  BASIC  METABOLIC PANEL - Abnormal; Notable for the following components:      Result Value   BUN 30 (*)    GFR, Estimated 59 (*)    All other components within normal limits  CBC - Abnormal; Notable for the following components:   RBC 3.73 (*)    All other components within normal limits  URINALYSIS, ROUTINE W REFLEX MICROSCOPIC - Abnormal; Notable for the following components:   Color, Urine YELLOW (*)    APPearance HAZY (*)    All other components within normal limits     EKG  ED ECG REPORT I, Jene Every, the attending physician, personally viewed and interpreted this ECG.  Date: 07/08/2023  Rhythm: Atrial fibrillation QRS Axis: normal Intervals: Abnormal ST/T Wave abnormalities: normal Narrative Interpretation: no evidence of acute ischemia    RADIOLOGY Chest x-ray viewed interpret by me, cardiomegaly noted    PROCEDURES:  Critical Care performed:   Procedures   MEDICATIONS ORDERED IN ED: Medications - No data to display   IMPRESSION / MDM / ASSESSMENT AND PLAN / ED COURSE  I reviewed the triage  vital signs and the nursing notes. Patient's presentation is most consistent with acute presentation with potential threat to life or bodily function.  Patient presents after a syncopal episode as detailed above, I suspect this may have been related to position/vasovagal syncope.  No palpitations to suggest arrhythmia, or EKG demonstrates atrial fibrillation which is chronic for her.  No chest pain to suggest cardiac origin.  Lab work reviewed and is overall reassuring  Chest x-ray, urinalysis, labs reassuring.  Patient is asymptomatic here with normal vitals.  Discussed with patient and her daughter, doubt benefit to admission, they agree, appropriate for discharge at this time with close outpatient follow-up, return precautions discussed      FINAL CLINICAL IMPRESSION(S) / ED DIAGNOSES   Final diagnoses:  Syncope, unspecified syncope type     Rx / DC Orders    ED Discharge Orders     None        Note:  This document was prepared using Dragon voice recognition software and may include unintentional dictation errors.   Jene Every, MD 07/08/23 [id number]

## 2023-12-15 ENCOUNTER — Emergency Department

## 2023-12-15 ENCOUNTER — Observation Stay
Admission: EM | Admit: 2023-12-15 | Discharge: 2023-12-17 | Disposition: A | Discharge status: Home / Self Care | Attending: Internal Medicine | Admitting: Internal Medicine

## 2023-12-15 ENCOUNTER — Other Ambulatory Visit: Payer: Self-pay

## 2023-12-15 DIAGNOSIS — I5032 Chronic diastolic (congestive) heart failure: Secondary | ICD-10-CM | POA: Diagnosis present

## 2023-12-15 DIAGNOSIS — R509 Fever, unspecified: Secondary | ICD-10-CM | POA: Diagnosis present

## 2023-12-15 DIAGNOSIS — Z86711 Personal history of pulmonary embolism: Secondary | ICD-10-CM | POA: Diagnosis not present

## 2023-12-15 DIAGNOSIS — E785 Hyperlipidemia, unspecified: Secondary | ICD-10-CM | POA: Insufficient documentation

## 2023-12-15 DIAGNOSIS — Z79899 Other long term (current) drug therapy: Secondary | ICD-10-CM | POA: Insufficient documentation

## 2023-12-15 DIAGNOSIS — G459 Transient cerebral ischemic attack, unspecified: Secondary | ICD-10-CM | POA: Diagnosis not present

## 2023-12-15 DIAGNOSIS — I130 Hypertensive heart and chronic kidney disease with heart failure and stage 1 through stage 4 chronic kidney disease, or unspecified chronic kidney disease: Secondary | ICD-10-CM | POA: Insufficient documentation

## 2023-12-15 DIAGNOSIS — R2681 Unsteadiness on feet: Secondary | ICD-10-CM | POA: Insufficient documentation

## 2023-12-15 DIAGNOSIS — D696 Thrombocytopenia, unspecified: Secondary | ICD-10-CM | POA: Diagnosis not present

## 2023-12-15 DIAGNOSIS — R1310 Dysphagia, unspecified: Secondary | ICD-10-CM | POA: Insufficient documentation

## 2023-12-15 DIAGNOSIS — E039 Hypothyroidism, unspecified: Secondary | ICD-10-CM | POA: Insufficient documentation

## 2023-12-15 DIAGNOSIS — N1831 Chronic kidney disease, stage 3a: Secondary | ICD-10-CM | POA: Insufficient documentation

## 2023-12-15 DIAGNOSIS — I4821 Permanent atrial fibrillation: Secondary | ICD-10-CM | POA: Insufficient documentation

## 2023-12-15 DIAGNOSIS — U071 COVID-19: Principal | ICD-10-CM | POA: Insufficient documentation

## 2023-12-15 DIAGNOSIS — I129 Hypertensive chronic kidney disease with stage 1 through stage 4 chronic kidney disease, or unspecified chronic kidney disease: Secondary | ICD-10-CM | POA: Diagnosis not present

## 2023-12-15 DIAGNOSIS — I10 Essential (primary) hypertension: Secondary | ICD-10-CM | POA: Diagnosis not present

## 2023-12-15 LAB — RESP PANEL BY RT-PCR (RSV, FLU A&B, COVID)  RVPGX2
Influenza A by PCR: NEGATIVE
Influenza B by PCR: NEGATIVE
Resp Syncytial Virus by PCR: NEGATIVE
SARS Coronavirus 2 by RT PCR: POSITIVE — AB

## 2023-12-15 LAB — CBC
HCT: 40.1 % (ref 36.0–46.0)
Hemoglobin: 13.1 g/dL (ref 12.0–15.0)
MCH: 30 pg (ref 26.0–34.0)
MCHC: 32.7 g/dL (ref 30.0–36.0)
MCV: 92 fL (ref 80.0–100.0)
Platelets: 115 K/uL — ABNORMAL LOW (ref 150–400)
RBC: 4.36 MIL/uL (ref 3.87–5.11)
RDW: 18.2 % — ABNORMAL HIGH (ref 11.5–15.5)
WBC: 4.5 K/uL (ref 4.0–10.5)
nRBC: 0 % (ref 0.0–0.2)

## 2023-12-15 LAB — COMPREHENSIVE METABOLIC PANEL WITH GFR
ALT: 21 U/L (ref 0–44)
AST: 38 U/L (ref 15–41)
Albumin: 3.8 g/dL (ref 3.5–5.0)
Alkaline Phosphatase: 84 U/L (ref 38–126)
Anion gap: 12 (ref 5–15)
BUN: 15 mg/dL (ref 8–23)
CO2: 23 mmol/L (ref 22–32)
Calcium: 9 mg/dL (ref 8.9–10.3)
Chloride: 98 mmol/L (ref 98–111)
Creatinine, Ser: 1.08 mg/dL — ABNORMAL HIGH (ref 0.44–1.00)
GFR, Estimated: 47 mL/min — ABNORMAL LOW (ref >60)
Glucose, Bld: 102 mg/dL — ABNORMAL HIGH (ref 70–99)
Potassium: 4 mmol/L (ref 3.5–5.1)
Sodium: 133 mmol/L — ABNORMAL LOW (ref 135–145)
Total Bilirubin: 0.7 mg/dL (ref 0.0–1.2)
Total Protein: 7.3 g/dL (ref 6.5–8.1)

## 2023-12-15 LAB — DIGOXIN LEVEL: Digoxin Level: 0.6 ng/mL — ABNORMAL LOW (ref 0.8–2.0)

## 2023-12-15 LAB — BRAIN NATRIURETIC PEPTIDE: B Natriuretic Peptide: 360.4 pg/mL — ABNORMAL HIGH (ref 0.0–100.0)

## 2023-12-15 LAB — GROUP A STREP BY PCR: Group A Strep by PCR: NOT DETECTED

## 2023-12-15 MED ORDER — ALLOPURINOL 100 MG PO TABS
100.0000 mg | ORAL_TABLET | Freq: Every day | ORAL | Status: DC
  Administered 2023-12-16 – 2023-12-17 (×2): 100 mg via ORAL
  Filled 2023-12-15 (×2): qty 1

## 2023-12-15 MED ORDER — ACETAMINOPHEN 325 MG PO TABS: 650.0000 mg | ORAL_TABLET | Freq: Four times a day (QID) | ORAL | Status: DC | PRN

## 2023-12-15 MED ORDER — ONDANSETRON HCL 4 MG/2ML IJ SOLN: 4.0000 mg | Freq: Three times a day (TID) | INTRAMUSCULAR | Status: DC | PRN

## 2023-12-15 MED ORDER — METHYLPREDNISOLONE SODIUM SUCC 40 MG IJ SOLR
40.0000 mg | Freq: Every day | INTRAMUSCULAR | Status: DC
  Administered 2023-12-15 – 2023-12-16 (×2): 40 mg via INTRAVENOUS
  Filled 2023-12-15 (×2): qty 1

## 2023-12-15 MED ORDER — ATORVASTATIN CALCIUM 20 MG PO TABS
40.0000 mg | ORAL_TABLET | Freq: Every day | ORAL | Status: DC
  Administered 2023-12-16 – 2023-12-17 (×2): 40 mg via ORAL
  Filled 2023-12-15 (×2): qty 2

## 2023-12-15 MED ORDER — HYDRALAZINE HCL 20 MG/ML IJ SOLN
5.0000 mg | INTRAMUSCULAR | Status: DC | PRN
  Administered 2023-12-17: 5 mg via INTRAVENOUS
  Filled 2023-12-15: qty 1

## 2023-12-15 MED ORDER — PHENOL 1.4 % MT LIQD: 1.0000 | OROMUCOSAL | Status: DC | PRN

## 2023-12-15 MED ORDER — ALBUTEROL SULFATE (2.5 MG/3ML) 0.083% IN NEBU: 2.5000 mg | INHALATION_SOLUTION | RESPIRATORY_TRACT | Status: DC | PRN

## 2023-12-15 MED ORDER — APIXABAN 2.5 MG PO TABS
2.5000 mg | ORAL_TABLET | Freq: Two times a day (BID) | ORAL | Status: DC
  Administered 2023-12-15 – 2023-12-17 (×4): 2.5 mg via ORAL
  Filled 2023-12-15 (×4): qty 1

## 2023-12-15 MED ORDER — DM-GUAIFENESIN ER 30-600 MG PO TB12: 1.0000 | ORAL_TABLET | Freq: Two times a day (BID) | ORAL | Status: DC | PRN

## 2023-12-15 MED ORDER — LEVOTHYROXINE SODIUM 50 MCG PO TABS
25.0000 ug | ORAL_TABLET | Freq: Every day | ORAL | Status: DC
  Administered 2023-12-16 – 2023-12-17 (×2): 25 ug via ORAL
  Filled 2023-12-15 (×2): qty 1

## 2023-12-15 MED ORDER — SODIUM CHLORIDE 0.9 % IV BOLUS
500.0000 mL | Freq: Once | INTRAVENOUS | Status: AC
  Administered 2023-12-15: 500 mL via INTRAVENOUS

## 2023-12-15 MED ORDER — POLYVINYL ALCOHOL 1.4 % OP SOLN: 2.0000 [drp] | Freq: Three times a day (TID) | OPHTHALMIC | Status: DC | PRN

## 2023-12-15 MED ORDER — PANTOPRAZOLE SODIUM 40 MG PO TBEC
40.0000 mg | DELAYED_RELEASE_TABLET | Freq: Every day | ORAL | Status: DC
  Administered 2023-12-16 – 2023-12-17 (×2): 40 mg via ORAL
  Filled 2023-12-15 (×2): qty 1

## 2023-12-15 MED ORDER — DIGOXIN 125 MCG PO TABS
0.0625 mg | ORAL_TABLET | Freq: Every day | ORAL | Status: DC
  Administered 2023-12-16 – 2023-12-17 (×2): 0.0625 mg via ORAL
  Filled 2023-12-15 (×2): qty 0.5

## 2023-12-15 MED ORDER — METOPROLOL SUCCINATE ER 100 MG PO TB24
100.0000 mg | ORAL_TABLET | Freq: Every day | ORAL | Status: DC
  Administered 2023-12-16: 100 mg via ORAL
  Filled 2023-12-15: qty 1

## 2023-12-15 NOTE — ED Provider Triage Note (Addendum)
 Emergency Medicine Provider Triage Evaluation Note  Allison Novak , a 89 y.o. female  was evaluated in triage.  Pt complains of weakness, fever, sore throat.  Patient is here with her daughter.  Patient has history of a A-fib  Review of Systems  Positive:  Negative:  Physical Exam  BP (!) 166/75   Pulse (!) 50   Temp 98.2 F (36.8 C)   Resp 17   Ht 5' 1 (1.549 m)   Wt 59 kg   SpO2 93%   BMI 24.56 kg/m  Gen:   Awake, no distress during triage patient was bradycardic, hypertensive, afebrile Resp:  Normal effort  MSK:   Moves extremities without difficulty Other:    Medical Decision Making  Medically screening exam initiated at 3:26 PM.  Appropriate orders placed.  Allison Novak was informed that the remainder of the evaluation will be completed by another provider, this initial triage assessment does not replace that evaluation, and the importance of remaining in the ED until their evaluation is complete.  Patient who presents today with history of 24 hours of weakness, fever, sore throat and sleepiness.  Will order chest x-ray, EKG, UA, troponins, CBC and CMP, strep A, respiratory panel   Janit Kast, PA-C 12/15/23 1528    Janit Kast, PA-C 12/15/23 1529

## 2023-12-15 NOTE — ED Triage Notes (Addendum)
 Pt comes with c/o fatigue for two days. Pt states she has been sleeping for the last two days. Pt states sore throat .   Family reports pt appeared confused yesterday and then today she couldn't get her out of the car. Pt later did get out. Pt just has not been able to stay awake.   Family states hx of tia in past. Pt denies any slurred speech, dizziness or weakness. Pt states she just can't stay awake. No weakness, drift or facial droop noted. Pt speaking in clear complete sentences.

## 2023-12-15 NOTE — ED Notes (Signed)
 Pt reporting to ED d/t flu-liek symptoms, confirmed to be Covid. Pt has been feeling weak, dizzy, and out of it lately. Pt had been around sick contacts, but was unaware if the contact was covid+. Pt [facility] fever [facility] 101.17f at [facility] and now 98.41f. Pt ABCs intact. RR even and unlabored. Pt in NAD. Bed in lowest locked position. Call bell in reach. Denies needs at this time.   Past Medical History:  Diagnosis Date   A-fib (HCC)    Anemia    Carotid artery stenosis    CHF (congestive heart failure) (HCC)    Chronic kidney disease    Coronary artery disease    Diverticulosis    GERD (gastroesophageal reflux disease)    Gout    Hiatal hernia    Hyperlipidemia    Hypertension    Osteoporosis    Pulmonary embolus (HCC)

## 2023-12-15 NOTE — H&P (Signed)
 History and Physical    Gal  T Robbs FMW:[id number] DOB: 10-Jun-1925 DOA: 12/15/2023  Referring MD/NP/PA:   PCP: Fernande Ophelia [name], MD   Patient coming from:  The patient is coming from home.     Chief Complaint: Sore throat, fever, cough HPI: Allison  T Novak is a 89 y.o. female with medical history significant of dCHF, hypertension, hyperlipidemia, TIA, GERD, hypothyroidism, gout, atrial fibrillation and PE on Eliquis , CKD-3A, carotid artery stenosis, anemia, CAD, who presents fever, sore throat, cough.  The patient and her daughter at the bedside, patient has fever, dry cough, mild SOB, sore throat, body aches, weakness in the past 2 days.  She had fever 101.3 at home.  Her temperature is 98.2 in ED. Patient has poor appetite and decreased oral intake. She has sore throat pain when swallowing.  Patient does not have chest pain.  No nausea, vomiting, diarrhea or abdominal pain.  No symptoms of UTI.  No fall or injury. Pt was seen in [facility] and sent to ED for further evaluation and treatment.  Patient reportedly had confusion, but she is alert and oriented x 3 when I saw patient in ED.  She moves all extremities normally.  Data reviewed independently and ED Course: pt was found to have positive COVID-19 test, WBC 4.5, BNP 360, renal function close to baseline, negative strep A PCR.  Temperature 98.2, blood pressure 166/75, heart rate 50, RR 17, oxygen sat 93% on room air.  Chest x-ray showed cardiomegaly and small right pleural effusion, no infiltration.  Patient is admitted to the (patient.   EKG: I have personally reviewed.  A-fib, QTc 482, RAD, incomplete right bundle blockade.   Review of Systems:   General: has fevers, chills, no body weight gain, has poor appetite, has fatigue HEENT: no blurry vision, hearing changes or sore throat Respiratory: has mild dyspnea, coughing, no wheezing CV: no chest pain, no palpitations GI: no nausea, vomiting, abdominal pain, diarrhea, constipation GU:  no dysuria, burning on urination, increased urinary frequency, hematuria  Ext: has trace leg edema Neuro: no unilateral weakness, numbness, or tingling, no vision change or hearing loss Skin: no rash, no skin tear. MSK: No muscle spasm, no deformity, no limitation of range of movement in spin Heme: No easy bruising.  Travel history: No recent long distant travel.   Allergy:  Allergies  Allergen Reactions   Keflex  [Cephalexin ] Other (See Comments)    Pt cant remember exact reaction   Macrodantin [Nitrofurantoin Macrocrystal] Other (See Comments)    Past Medical History:  Diagnosis Date   A-fib (HCC)    Anemia    Carotid artery stenosis    CHF (congestive heart failure) (HCC)    Chronic kidney disease    Coronary artery disease    Diverticulosis    GERD (gastroesophageal reflux disease)    Gout    Hiatal hernia    Hyperlipidemia    Hypertension    Osteoporosis    Pulmonary embolus (HCC)     Past Surgical History:  Procedure Laterality Date   ABDOMINAL HYSTERECTOMY     ANKLE SURGERY Left 2011   IR CHOLANGIOGRAM EXISTING TUBE  09/20/2022   IR PERC CHOLECYSTOSTOMY  08/19/2022   IR RADIOLOGIST EVAL & MGMT  09/28/2022   KNEE ARTHROSCOPY Right     Social History:  reports that she has never smoked. She has never been exposed to tobacco smoke. She has never used smokeless tobacco. She reports that she does not drink alcohol  and does not use  drugs.  Family History:  Family History  Problem Relation Age of Onset   CAD Mother    Hypertension Sister    COPD Brother      Prior to Admission medications   Medication Sig Start Date End Date Taking? Authorizing Provider  acetaminophen  (TYLENOL ) 325 MG tablet Take 2 tablets (650 mg total) by mouth every 6 (six) hours as needed for mild pain (or Fever >/= 101). 08/23/22   Dorinda Drue DASEN, MD  albuterol  (VENTOLIN  HFA) 108 (90 Base) MCG/ACT inhaler Inhale 2 puffs into the lungs every 6 (six) hours as needed for wheezing or shortness of  breath. 04/26/22   [provider]  allopurinol  (ZYLOPRIM ) 100 MG tablet Take 100 mg by mouth daily.    [provider]  atorvastatin  (LIPITOR) 40 MG tablet Take 40 mg by mouth daily. 11/18/16   [provider]  calcium  carbonate (OSCAL) 1500 (600 Ca) MG TABS tablet Take 1 tablet by mouth in the morning and at bedtime.    [provider]  Cholecalciferol  50 MCG (2000 UT) CAPS Take 2,000 Units by mouth daily.    [provider]  digoxin  (LANOXIN ) 0.125 MG tablet Take 0.0625 mg by mouth daily. 08/27/21   [provider]  ELIQUIS  2.5 MG TABS tablet Take 2.5 mg by mouth 2 (two) times daily. 02/13/20   [provider]  esomeprazole (NEXIUM) 40 MG capsule Take 40 mg by mouth daily.    [provider]  levothyroxine  (SYNTHROID ) 25 MCG tablet Take 25 mcg by mouth daily. 07/14/21   [provider]  metoprolol  succinate (TOPROL -XL) 100 MG 24 hr tablet Take 100 mg by mouth daily after supper.    [provider]  Multiple Vitamin (MULTI-VITAMIN) tablet Take 1 tablet by mouth daily.    [provider]  polyvinyl alcohol  (LIQUIFILM TEARS) 1.4 % ophthalmic solution Place 2 drops into the left eye every 8 (eight) hours as needed for dry eyes. 07/26/21   [provider]  potassium chloride  (KLOR-CON ) 10 MEQ tablet Take 10 mEq by mouth daily after supper. 07/26/22   [provider]    Physical Exam: Vitals:   12/15/23 1523 12/15/23 1526 12/15/23 2015  BP:  (!) 166/75 (!) 173/105  Pulse:  (!) 50 90  Resp:  17 18  Temp:  98.2 F (36.8 C) 98.3 F (36.8 C)  TempSrc:   Oral  SpO2:  93% 94%  Weight: 59 kg    Height: 5' 1 (1.549 m)     General: Not in acute distress HEENT:       Eyes: PERRL, EOMI, no jaundice       ENT: No discharge from the ears and nose, no pharynx injection, no tonsillar enlargement.        Neck: No JVD, no bruit, no mass felt. Heme: No neck lymph node enlargement. Cardiac:  S1/S2, irregularly irregular rhythm, no gallops or rubs. Respiratory: No rales, wheezing, rhonchi or rubs. GI: Soft, nondistended, nontender, no rebound pain, no organomegaly, BS present. GU: No hematuria Ext: has trace pitting leg edema bilaterally. 1+DP/PT pulse bilaterally. Musculoskeletal: No joint deformities, No joint redness or warmth, no limitation of ROM in spin. Skin: No rashes.  Neuro: Alert, oriented X3, cranial nerves II-XII grossly intact, moves all extremities normally.  Psych: Patient is not psychotic, no suicidal or hemocidal ideation.  Labs on Admission: I have personally reviewed following labs and imaging studies  CBC: Recent Labs  Lab 12/15/23 1525  WBC 4.5  HGB 13.1  HCT 40.1  MCV 92.0  PLT 115*   Basic Metabolic Panel: Recent Labs  Lab 12/15/23 1525  NA 133*  K 4.0  CL 98  CO2 23  GLUCOSE 102*  BUN 15  CREATININE 1.08*  CALCIUM  9.0   GFR: Estimated Creatinine Clearance: 25.2 mL/min (A) (by C-G formula based on SCr of 1.08 mg/dL (H)). Liver Function Tests: Recent Labs  Lab 12/15/23 1525  AST 38  ALT 21  ALKPHOS 84  BILITOT 0.7  PROT 7.3  ALBUMIN  3.8   No results for input(s): LIPASE, AMYLASE in the last 168 hours. No results for input(s): AMMONIA in the last 168 hours. Coagulation Profile: No results for input(s): INR, PROTIME in the last 168 hours. Cardiac Enzymes: No results for input(s): CKTOTAL, CKMB, CKMBINDEX, TROPONINI in the last 168 hours. BNP (last 3 results) No results for input(s): PROBNP in the last 8760 hours. HbA1C: No results for input(s): HGBA1C in the last 72 hours. CBG: No results for input(s): GLUCAP in the last 168 hours. Lipid Profile: No results for input(s): CHOL, HDL, LDLCALC, TRIG, CHOLHDL, LDLDIRECT in the last 72 hours. Thyroid  Function Tests: No results for input(s): TSH, T4TOTAL, FREET4, T3FREE, THYROIDAB in the last 72 hours. Anemia Panel: No results for  input(s): VITAMINB12, FOLATE, FERRITIN, TIBC, IRON , RETICCTPCT in the last 72 hours. Urine analysis:    Component Value Date/Time   COLORURINE YELLOW (A) 07/08/2023 1156   APPEARANCEUR HAZY (A) 07/08/2023 1156   LABSPEC 1.016 07/08/2023 1156   PHURINE 5.0 07/08/2023 1156   GLUCOSEU NEGATIVE 07/08/2023 1156   HGBUR NEGATIVE 07/08/2023 1156   BILIRUBINUR NEGATIVE 07/08/2023 1156   KETONESUR NEGATIVE 07/08/2023 1156   PROTEINUR NEGATIVE 07/08/2023 1156   NITRITE NEGATIVE 07/08/2023 1156   LEUKOCYTESUR NEGATIVE 07/08/2023 1156   Sepsis Labs: @LABRCNTIP (procalcitonin:4,lacticidven:4) ) Recent Results (from the past 240 hours)  Resp panel by RT-PCR (RSV, Flu A&B, Covid) Anterior Nasal Swab     Status: Abnormal   Collection Time: 12/15/23  3:25 PM   Specimen: Anterior Nasal Swab  Result Value Ref Range Status   SARS Coronavirus 2 by RT PCR POSITIVE (A) NEGATIVE Final    Comment: (NOTE) SARS-CoV-2 target nucleic acids are DETECTED.  The SARS-CoV-2 RNA is generally detectable in upper respiratory specimens during the acute phase of infection. Positive results are indicative of the presence of the identified virus, but do not rule out bacterial infection or co-infection with other pathogens not detected by the test. Clinical correlation with patient history and other diagnostic information is necessary to determine patient infection status. The expected result is Negative.  Fact Sheet for Patients: BloggerCourse.com  Fact Sheet for Healthcare Providers: SeriousBroker.it  This test is not yet approved or cleared by the [place]  FDA and  has been authorized for detection and/or diagnosis of SARS-CoV-2 by FDA under an Emergency Use Authorization (EUA).  This EUA will remain in effect (meaning this test can be used) for the duration of  the COVID-19 declaration under Section 564(b)(1) of the A ct, 21 U.S.C. section  360bbb-3(b)(1), unless the authorization is terminated or revoked sooner.     Influenza A by PCR NEGATIVE NEGATIVE Final   Influenza B by PCR NEGATIVE NEGATIVE Final    Comment: (NOTE) The Xpert Xpress SARS-CoV-2/FLU/RSV plus assay is intended as an aid in the diagnosis of influenza from Nasopharyngeal swab specimens and should not be used as a sole basis for treatment. Nasal washings and aspirates are unacceptable for Xpert Xpress SARS-CoV-2/FLU/RSV  testing.  Fact Sheet for Patients: BloggerCourse.com  Fact Sheet for Healthcare Providers: SeriousBroker.it  This test is not yet approved or cleared by the [place]  FDA and has been authorized for detection and/or diagnosis of SARS-CoV-2 by FDA under an Emergency Use Authorization (EUA). This EUA will remain in effect (meaning this test can be used) for the duration of the COVID-19 declaration under Section 564(b)(1) of the Act, 21 U.S.C. section 360bbb-3(b)(1), unless the authorization is terminated or revoked.     Resp Syncytial Virus by PCR NEGATIVE NEGATIVE Final    Comment: (NOTE) Fact Sheet for Patients: BloggerCourse.com  Fact Sheet for Healthcare Providers: SeriousBroker.it  This test is not yet approved or cleared by the [place]  FDA and has been authorized for detection and/or diagnosis of SARS-CoV-2 by FDA under an Emergency Use Authorization (EUA). This EUA will remain in effect (meaning this test can be used) for the duration of the COVID-19 declaration under Section 564(b)(1) of the Act, 21 U.S.C. section 360bbb-3(b)(1), unless the authorization is terminated or revoked.  Performed at [facility], [address] [place], [place] [address]   Group A Strep by PCR [facility] Only)     Status: None   Collection Time: 12/15/23  3:25 PM   Specimen: Throat; Sterile Swab  Result Value Ref Range Status    Group A Strep by PCR NOT DETECTED NOT DETECTED Final    Comment: Performed at [facility], [address] [place], [place] [address]     Radiological Exams on Admission:   Assessment/Plan Principal Problem:   COVID-19 virus infection Active Problems:   Essential hypertension   HLD (hyperlipidemia)   Permanent atrial fibrillation (HCC)   History of pulmonary embolism   Chronic diastolic CHF (congestive heart failure) (HCC)   TIA (transient ischemic attack)   Stage 3a chronic kidney disease (CKD) (HCC)   Acquired hypothyroidism   Thrombocytopenia (HCC)   Assessment and Plan:  COVID-19 virus infection: Chest x-ray negative for infiltration, oxygen saturation 93% on room air.  Given her old age 44, patient at high risk of deteriorating.  -will admit patient to telemetry bed  -Bronchodilators and prn Mucinex  -Solu-Medrol  40 mg IV daily -Incentive spirometry -fall precaution - PT because of weakness and high risk of fall  Essential hypertension -IV hydralazine  as needed - Metoprolol   HLD (hyperlipidemia) -Lipitor  Permanent atrial fibrillation (HCC): Heart rate 50-90s -Continue Eliquis  and metoprolol  - Continue digoxin  and check digoxin  level  History of pulmonary embolism -On Eliquis   Chronic diastolic CHF (congestive heart failure) (HCC): 2D echo on 07/05/2023 showed EF of 55 to 60%.  Patient has trace leg edema, no pulm edema chest x-ray, CHF seem to be compensated.  BNP 360. -Patient received 500 cc normal saline in ED, will not give more IV fluid - Watch volume status closely  TIA (transient ischemic attack) -Patient is on Lipitor and Eliquis   Stage 3a chronic kidney disease (CKD) (HCC): Renal function is stable.  Baseline creatinine 1.1-1.3.  Her creatinine 1.08, BUN 15 and GFR 47. -f/u with BMP  Acquired hypothyroidism -Synthroid   Thrombocytopenia (HCC): This is chronic issue.  Platelet 115.  No active bleeding. - Follow-up with  CBC     DVT ppx: on Eliquis   Code Status: DNR per pt and her daughter  Family Communication:   Yes, patient's daughter    at bed side.      Disposition Plan:  Anticipate discharge back to previous environment  Consults called:  none  Admission  status and Level of care: Telemetry Medical:  as inpt        Dispo: The patient is from: Home              Anticipated d/c is to: Home              Anticipated d/c date is: 2 days              Patient currently is not medically stable to d/c.    Severity of Illness:  The appropriate patient status for this patient is INPATIENT. Inpatient status is judged to be reasonable and necessary in order to provide the required intensity of service to ensure the patient's safety. The patient's presenting symptoms, physical exam findings, and initial radiographic and laboratory data in the context of their chronic comorbidities is felt to place them at high risk for further clinical deterioration. Furthermore, it is not anticipated that the patient will be medically stable for discharge from the hospital within 2 midnights of admission.   * I certify that at the point of admission it is my clinical judgment that the patient will require inpatient hospital care spanning beyond 2 midnights from the point of admission due to high intensity of service, high risk for further deterioration and high frequency of surveillance required.*       Date of Service 12/15/2023    Caleb Exon [facility]   If 7PM-7AM, please contact night-coverage [email or web address] 12/15/2023, 10:34 PM

## 2023-12-15 NOTE — ED Triage Notes (Signed)
 First nurse note: Pt to ED via POV from [facility]. KC reports fever 101.3 and Sore throat x2 days. Daughter states on the way her pt seemed confused. Pt reports has not been able to take medication due to difficulty swallowing

## 2023-12-16 DIAGNOSIS — I5032 Chronic diastolic (congestive) heart failure: Secondary | ICD-10-CM | POA: Diagnosis not present

## 2023-12-16 DIAGNOSIS — U071 COVID-19: Secondary | ICD-10-CM | POA: Diagnosis not present

## 2023-12-16 DIAGNOSIS — I4821 Permanent atrial fibrillation: Secondary | ICD-10-CM | POA: Diagnosis not present

## 2023-12-16 DIAGNOSIS — I10 Essential (primary) hypertension: Secondary | ICD-10-CM | POA: Diagnosis not present

## 2023-12-16 LAB — URINALYSIS, ROUTINE W REFLEX MICROSCOPIC
Bilirubin Urine: NEGATIVE
Glucose, UA: NEGATIVE mg/dL
Hgb urine dipstick: NEGATIVE
Ketones, ur: 5 mg/dL — AB
Leukocytes,Ua: NEGATIVE
Nitrite: NEGATIVE
Protein, ur: 30 mg/dL — AB
Specific Gravity, Urine: 1.01 (ref 1.005–1.030)
pH: 6 (ref 5.0–8.0)

## 2023-12-16 LAB — CBC
HCT: 40.1 % (ref 36.0–46.0)
Hemoglobin: 12.9 g/dL (ref 12.0–15.0)
MCH: 29.7 pg (ref 26.0–34.0)
MCHC: 32.2 g/dL (ref 30.0–36.0)
MCV: 92.4 fL (ref 80.0–100.0)
Platelets: 111 K/uL — ABNORMAL LOW (ref 150–400)
RBC: 4.34 MIL/uL (ref 3.87–5.11)
RDW: 17.9 % — ABNORMAL HIGH (ref 11.5–15.5)
WBC: 2.3 K/uL — ABNORMAL LOW (ref 4.0–10.5)
nRBC: 0 % (ref 0.0–0.2)

## 2023-12-16 LAB — BASIC METABOLIC PANEL WITH GFR
Anion gap: 9 (ref 5–15)
BUN: 15 mg/dL (ref 8–23)
CO2: 25 mmol/L (ref 22–32)
Calcium: 8.2 mg/dL — ABNORMAL LOW (ref 8.9–10.3)
Chloride: 99 mmol/L (ref 98–111)
Creatinine, Ser: 0.85 mg/dL (ref 0.44–1.00)
GFR, Estimated: >60 mL/min (ref >60)
Glucose, Bld: 113 mg/dL — ABNORMAL HIGH (ref 70–99)
Potassium: 3.8 mmol/L (ref 3.5–5.1)
Sodium: 133 mmol/L — ABNORMAL LOW (ref 135–145)

## 2023-12-16 MED ORDER — PREDNISONE 20 MG PO TABS
20.0000 mg | ORAL_TABLET | Freq: Every day | ORAL | Status: DC
  Administered 2023-12-17: 20 mg via ORAL
  Filled 2023-12-16: qty 1

## 2023-12-16 NOTE — Progress Notes (Signed)
 [facility]  - [place] [facility]   PATIENT NAME: Allison  Novak    MR#:  [id number]  DATE OF BIRTH:  10-05-1925  SUBJECTIVE:  daughter at bedside. Patient a bit hard on hearing sitting in the recliner chair. No fever. Tolerated PO diet. No respiratory distress or cough.    VITALS:  Blood pressure (!) 132/59, pulse 91, temperature 97.7 F (36.5 C), temperature source Oral, resp. rate 16, height 5' 1 (1.549 m), weight 57.6 kg, SpO2 93%.  PHYSICAL EXAMINATION:   GENERAL:  89 y.o.-year-old patient with no acute distress.  LUNGS: Normal breath sounds bilaterally, no wheezing CARDIOVASCULAR: S1, S2 normal. No murmur   ABDOMEN: Soft, nontender, nondistended. Bowel sounds present.  EXTREMITIES: No  edema b/l.    NEUROLOGIC: nonfocal  patient is alert and awake SKIN: No obvious rash, lesion, or ulcer.   LABORATORY PANEL:  CBC Recent Labs  Lab 12/16/23 0552  WBC 2.3*  HGB 12.9  HCT 40.1  PLT 111*    Chemistries  Recent Labs  Lab 12/15/23 1525 12/16/23 0552  NA 133* 133*  K 4.0 3.8  CL 98 99  CO2 23 25  GLUCOSE 102* 113*  BUN 15 15  CREATININE 1.08* 0.85  CALCIUM  9.0 8.2*  AST 38  --   ALT 21  --   ALKPHOS 84  --   BILITOT 0.7  --    Cardiac Enzymes No results for input(s): TROPONINI in the last 168 hours. RADIOLOGY:  DG Chest Port 1 View Result Date: 12/15/2023 CLINICAL DATA:  COVID EXAM: PORTABLE CHEST 1 VIEW COMPARISON:  07/08/2023 FINDINGS: Interstitial thickening appears stable, likely chronic in nature. No pneumothorax. Small right pleural effusion, decreased in size since prior examination. Stable cardiomegaly. Pulmonary vascularity is normal. No acute bone abnormality. IMPRESSION: 1. Small right pleural effusion, decreased in size since prior examination. 2. Stable cardiomegaly. Electronically Signed   By: Dorethia Molt M.D.   On: 12/15/2023 20:37   CT Head Wo Contrast Result Date: 12/15/2023 CLINICAL DATA:  Altered mental status EXAM:  CT HEAD WITHOUT CONTRAST TECHNIQUE: Contiguous axial images were obtained from the base of the skull through the vertex without intravenous contrast. RADIATION DOSE REDUCTION: This exam was performed according to the departmental dose-optimization program which includes automated exposure control, adjustment of the mA and/or kV according to patient size and/or use of iterative reconstruction technique. COMPARISON:  07/04/2023 FINDINGS: Brain: Chronic small vessel disease throughout the deep white matter. Old left basal ganglia lacunar infarct is stable. No acute intracranial abnormality. Specifically, no hemorrhage, hydrocephalus, mass lesion, acute infarction, or significant intracranial injury. Vascular: No hyperdense vessel or unexpected calcification. Skull: No acute calvarial abnormality. Sinuses/Orbits: No acute findings Other: None IMPRESSION: No acute intracranial abnormality. Electronically Signed   By: Franky Crease M.D.   On: 12/15/2023 15:57    Assessment and Plan  Caterra  T Myles is a 89 y.o. female with medical history significant of dCHF, hypertension, hyperlipidemia, TIA, GERD, hypothyroidism, gout, atrial fibrillation and PE on Eliquis , CKD-3A, carotid artery stenosis, anemia, CAD, who presents fever, sore throat, cough.   The patient and her daughter at the bedside, patient has fever, dry cough, mild SOB, sore throat, body aches, weakness in the past 2 days.  She had fever 101.3 at home.   Chest x-ray showed cardiomegaly and small right pleural effusion, no infiltration.  COVID positive  COVID-19 virus infection: Chest x-ray negative for infiltration, oxygen saturation 93% on room air.  Given her old age 75,  patient at high risk of deteriorating. -Bronchodilators and prn Mucinex  -Solu-Medrol  40 mg IV daily--change to po -Incentive spirometry -fall precaution - PT because of weakness and high risk of fall--recommends HHPT   Essential hypertension -IV hydralazine  as needed -  Metoprolol    HLD (hyperlipidemia) -Lipitor   Permanent atrial fibrillation (HCC): Heart rate 50-90s -Continue Eliquis , metoprolol , digoxin    History of pulmonary embolism -On Eliquis    Chronic diastolic CHF (congestive heart failure) (HCC): 2D echo on 07/05/2023 showed EF of 55 to 60%.  Patient has trace leg edema, no pulm edema chest x-ray, CHF seem to be compensated.  BNP 360.   TIA (transient ischemic attack) -Patient is on Lipitor and Eliquis    Stage 3a chronic kidney disease (CKD) (HCC): Renal function is stable.  Baseline creatinine 1.1-1.3.  Her creatinine 1.08, BUN 15 and GFR 47. -f/u with BMP   Acquired hypothyroidism -Synthroid    Thrombocytopenia (HCC): This is chronic issue.  Platelet 115.  No active bleeding.          DVT ppx: on Eliquis   Code Status: DNR per pt and her daughter  Family Communication:   Yes, patient's daughter    at bed side.      Disposition Plan:  Anticipate discharge back to previous environment  Consults called:  none Level of care: Telemetry Medical Status is: Observation The patient remains OBS appropriate and will d/c before 2 midnights.    TOTAL TIME TAKING CARE OF THIS PATIENT: 40 minutes.  >50% time spent on counselling and coordination of care  Note: This dictation was prepared with Dragon dictation along with smaller phrase technology. Any transcriptional errors that result from this process are unintentional.  Leita Blanch M.D    [facility]   CC: Primary care physician; Fernande Ophelia [name], MD

## 2023-12-16 NOTE — Plan of Care (Signed)

## 2023-12-16 NOTE — Evaluation (Signed)
 Physical Therapy Evaluation Patient Details Name: Allison Novak MRN: [id number] DOB: Jan 11, 1926 Today's Date: 12/16/2023  History of Present Illness  Pt admitted to [facility] on 12/15/23 under observation for c/o fever, sore throat, confusion, and difficulty swallowing. Found to be COVID positive. Significant PMH includes: dCHF, HTN, HLD, TIA, GERD, hypothyroidism, gout, Afib and PE on Eliquis , CKD-3A, carotid artery stenosis, anemia, CAD.   Clinical Impression  Pt received in supine eating breakfast and is agreeable for PT eval. At baseline, pt is mod I for ADL's, light IADL's, and limited community ambulation with RW; family assists with IADL's, medication management, and transportation.   Pt presents with decreased activity and decreased standing balance, resulting in impaired functional mobility from baseline. Due to deficits, pt required supervision for bed mobility, transfers, and to ambulate 74ft in room with RW. VSS and pt without c/o DOE or SOB after mobility.   Deficits limit the pt's ability to safely and independently perform ADL's, transfer, and ambulate. Pt will benefit from acute skilled PT services to address deficits for return to baseline function. Pt will benefit from post acute therapy services to address deficits for return to baseline function.         If plan is discharge home, recommend the following: A little help with walking and/or transfers;A little help with bathing/dressing/bathroom;Help with stairs or ramp for entrance;Assistance with cooking/housework;Assist for transportation   Can travel by private vehicle    Yes    Equipment Recommendations None recommended by PT     Functional Status Assessment Patient has had a recent decline in their functional status and demonstrates the ability to make significant improvements in function in a reasonable and predictable amount of time.     Precautions / Restrictions Precautions Precautions: Fall Precaution/Restrictions  Comments: SpO2 >/= 92%; COVID      Mobility  Bed Mobility               General bed mobility comments: supervision for safety to exit bed, HOB lowered, use of BUE for support, increased time/effort for anterior scooting towards EOB with BUE for support    Transfers                   General transfer comment: supervision for safety to stand from EOB with RW, increased time/effort to achieve full upright posture and for UE transition from EOB>RW. supervision for safety to sit in recliner with RW, verbal cues for safety, sequencing, hand placement, and RW proximity. Demonstrates fair eccentric lowering with proper hand placement.    Ambulation/Gait   Gait Distance (Feet): 15 Feet           General Gait Details: supervision for safety to ambulate in room with RW, demonstrating decreased step length/foot clearance, slowed cadence, and decreased RW proximity.     Balance Overall balance assessment: Needs assistance   Sitting balance-Leahy Scale: Good Sitting balance - Comments: adjusts socks seated EOB via figure 4 position, no LE/UE support, no LOB   Standing balance support: Bilateral upper extremity supported, During functional activity Standing balance-Leahy Scale: Fair Standing balance comment: standing balance in RW                             Pertinent Vitals/Pain Pain Assessment Pain Assessment: No/denies pain    Home Living Family/patient expects to be discharged to:: Private residence Living Arrangements: Alone Available Help at Discharge: Family;Available PRN/intermittently (son comes everyday, PRN care) Type of Home:  House Home Access: Ramped entrance     Alternate Level Stairs-Number of Steps: 12 (laundry in basement; doesn't go down there) Home Layout: Two level;Able to live on main level with bedroom/bathroom Home Equipment: Rolling Walker (2 wheels);Shower seat;Wheelchair - manual;Cane - single point;Hand held shower head;Grab  bars - tub/shower      Prior Function               Mobility Comments: household amb with RW, limited community amb with RW. Denies falls in last 76mo. ADLs Comments: Pt reports being mod I with ADL's, light/easy meals and medication management; son assists with meals, laundry, driving.     Extremity/Trunk Assessment   Upper Extremity Assessment Upper Extremity Assessment: Overall WFL for tasks assessed (grossly 4/5, sensation intact)    Lower Extremity Assessment Lower Extremity Assessment: Overall WFL for tasks assessed (grossly 4+/5, sensation intact)       Communication   Communication Communication: Impaired Factors Affecting Communication: Hearing impaired    Cognition Arousal: Alert Behavior During Therapy: WFL for tasks assessed/performed   PT - Cognitive impairments: No apparent impairments                         Following commands: Intact       Cueing Cueing Techniques: Verbal cues, Tactile cues, Visual cues     General Comments General comments (skin integrity, edema, etc.): denies dizziness, lightheadedness, SOB, or DOE during session    Exercises Other Exercises Other Exercises: Pt edu re: PT role/POC, DC recommendations, safety with functional mobility, call for help, activity pacing. She verbalized understanding.   Assessment/Plan    PT Assessment Patient needs continued PT services  PT Problem List Decreased activity tolerance;Decreased balance;Decreased mobility       PT Treatment Interventions DME instruction;Gait training;Functional mobility training;Therapeutic activities;Therapeutic exercise;Balance training;Neuromuscular re-education;Patient/family education    PT Goals (Current goals can be found in the Care Plan section)  Acute Rehab PT Goals Patient Stated Goal: go home PT Goal Formulation: With patient Time For Goal Achievement: 12/30/23 Potential to Achieve Goals: Good    Frequency Min 2X/week        AM-PAC  PT 6 Clicks Mobility  Outcome Measure Help needed turning from your back to your side while in a flat bed without using bedrails?: A Little Help needed moving from lying on your back to sitting on the side of a flat bed without using bedrails?: A Little Help needed moving to and from a bed to a chair (including a wheelchair)?: A Little Help needed standing up from a chair using your arms (e.g., wheelchair or bedside chair)?: A Little Help needed to walk in hospital room?: A Little Help needed climbing 3-5 steps with a railing? : A Little 6 Click Score: 18    End of Session Equipment Utilized During Treatment: Gait belt Activity Tolerance: Patient tolerated treatment well Patient left: in chair;with call bell/phone within reach;with chair alarm set Nurse Communication: Mobility status PT Visit Diagnosis: Unsteadiness on feet (R26.81)    Time: 9141-9075 PT Time Calculation (min) (ACUTE ONLY): 26 min   Charges:   PT Evaluation $PT Eval Moderate Complexity: 1 Mod PT Treatments $Therapeutic Activity: 8-22 mins PT General Charges $$ ACUTE PT VISIT: 1 Visit        Camie CHARLENA Kluver, PT, DPT 11:56 AM,12/16/23 Physical Therapist - [facility]

## 2023-12-16 NOTE — TOC Transition Note (Signed)
 Transition of Care [facility]) - Discharge Note   Patient Details  Name: Allison Novak MRN: [id number] Date of Birth: [birth year]  [facility] CM/SW Contact:  [place] L Reyann Troop, LCSW Phone Number: 12/16/2023, 9:33 AM   Clinical Narrative:     Patient ready for discharge tomorrow. CSW contacted Elveria Barrack, daughter, to offer choice for [facility]. CSW was advised that the choice is [place].   CSW contacted [facility] and set up [facility] for PT.   No further TOC needs identified. TOC signing off.         Patient Goals and CMS Choice            Discharge Placement                       Discharge Plan and Services Additional resources added to the After Visit Summary for                                       Social Drivers of Health (SDOH) Interventions SDOH Screenings   Food Insecurity: No Food Insecurity (12/15/2023)  Housing: High Risk (12/15/2023)  Transportation Needs: No Transportation Needs (12/15/2023)  Utilities: Not At Risk (12/15/2023)  Financial Resource Strain: Low Risk  (07/27/2023)   Received from [facility]  Social Connections: Socially Isolated (12/15/2023)  Tobacco Use: Low Risk  (12/15/2023)     Readmission Risk Interventions    08/19/2022   11:29 AM  Readmission Risk Prevention Plan  Transportation Screening Complete  PCP or Specialist Appt within 3-5 Days Complete  Social Work Consult for Recovery Care Planning/Counseling Complete  Palliative Care Screening Not Applicable  Medication Review Oceanographer) Complete

## 2023-12-17 DIAGNOSIS — U071 COVID-19: Secondary | ICD-10-CM | POA: Diagnosis not present

## 2023-12-17 NOTE — Care Management Obs Status (Signed)
 MEDICARE OBSERVATION STATUS NOTIFICATION   Patient Details  Name: Allison Novak MRN: [id number] Date of Birth: Aug 06, 1925   Medicare Observation Status Notification Given:  Yes    Merlin Ege W, CMA 12/17/2023, 11:14 AM

## 2023-12-17 NOTE — Discharge Summary (Signed)
 Physician Discharge Summary   Patient: Allison Novak MRN: [id number] DOB: 1925/06/10  Admit date:     12/15/2023  Discharge date: 12/17/2023  Discharge Physician: Leita Blanch   PCP: Fernande Ophelia [name], MD   Recommendations at discharge:   follow-up PCP in 1 to 2 weeks  Discharge Diagnoses: Principal Problem:   COVID-19 virus infection Active Problems:   Essential hypertension   HLD (hyperlipidemia)   Permanent atrial fibrillation (HCC)   History of pulmonary embolism   Chronic diastolic CHF (congestive heart failure) (HCC)   TIA (transient ischemic attack)   Stage 3a chronic kidney disease (CKD) (HCC)   Acquired hypothyroidism   Thrombocytopenia (HCC)   Allison Novak is a 89 y.o. female with medical history significant of dCHF, hypertension, hyperlipidemia, TIA, GERD, hypothyroidism, gout, atrial fibrillation and PE on Eliquis , CKD-3A, carotid artery stenosis, anemia, CAD, who presents fever, sore throat, cough.   The patient and her daughter at the bedside, patient has fever, dry cough, mild SOB, sore throat, body aches, weakness in the past 2 days.  She had fever 101.3 at home.    Chest x-ray showed cardiomegaly and small right pleural effusion, no infiltration.  COVID positive   COVID-19 virus infection: Chest x-ray negative for infiltration, oxygen saturation 93% on room air.  Given her old age 83, patient at high risk of deteriorating. -Bronchodilators and prn Mucinex  -Incentive spirometry -fall precaution - PT because of weakness and high risk of fall--recommends HHPT -- patient on room air, afebrile and no respiratory distress or cough.   Essential hypertension -IV hydralazine  as needed - Metoprolol    HLD (hyperlipidemia) -Lipitor   Permanent atrial fibrillation (HCC): Heart rate 50-90s -Continue Eliquis , metoprolol , digoxin    History of pulmonary embolism -On Eliquis    Chronic diastolic CHF (congestive heart failure) (HCC): 2D echo on 07/05/2023 showed EF  of 55 to 60%.  Patient has trace leg edema, no pulm edema chest x-ray, CHF seem to be compensated.  BNP 360.   TIA (transient ischemic attack) -Patient is on Lipitor and Eliquis    Stage 3a chronic kidney disease (CKD) (HCC): Renal function is stable.  Baseline creatinine 1.1-1.3.  Her creatinine 1.08, BUN 15 and GFR 47. -f/u with BMP   Acquired hypothyroidism -Synthroid    Thrombocytopenia (HCC): This is chronic issue.  Platelet 115.  No active bleeding.   Overall improved. Discharge plan discussed with patient's daughter she is in agreement. Home health arranged       DVT ppx: on Eliquis   Code Status: DNR per pt and her daughter  Family Communication:   Yes, patient's daughter          Disposition: Home health Diet recommendation:  Discharge Diet Orders (From admission, onward)     Start     Ordered   12/17/23 0000  Diet - low sodium heart healthy        12/17/23 0953           Cardiac diet DISCHARGE MEDICATION: Allergies as of 12/17/2023       Reactions   Keflex  [cephalexin ] Other (See Comments)   Pt cant remember exact reaction   Macrodantin [nitrofurantoin Macrocrystal] Other (See Comments)        Medication List     TAKE these medications    acetaminophen  325 MG tablet Commonly known as: TYLENOL  Take 2 tablets (650 mg total) by mouth every 6 (six) hours as needed for mild pain (or Fever >/= 101).   albuterol  108 (90 Base) MCG/ACT inhaler  Commonly known as: VENTOLIN  HFA Inhale 2 puffs into the lungs every 6 (six) hours as needed for wheezing or shortness of breath.   allopurinol  100 MG tablet Commonly known as: ZYLOPRIM  Take 100 mg by mouth daily.   artificial tears ophthalmic solution Place 2 drops into the left eye every 8 (eight) hours as needed for dry eyes.   atorvastatin  40 MG tablet Commonly known as: LIPITOR Take 40 mg by mouth daily.   calcium  carbonate 1500 (600 Ca) MG Tabs tablet Commonly known as: OSCAL Take 1 tablet by mouth in  the morning and at bedtime.   Cholecalciferol  50 MCG (2000 UT) Caps Take 2,000 Units by mouth daily.   digoxin  0.125 MG tablet Commonly known as: LANOXIN  Take 0.0625 mg by mouth daily.   Eliquis  2.5 MG Tabs tablet Generic drug: apixaban  Take 2.5 mg by mouth 2 (two) times daily.   esomeprazole 40 MG capsule Commonly known as: NEXIUM Take 40 mg by mouth daily.   levothyroxine  25 MCG tablet Commonly known as: SYNTHROID  Take 25 mcg by mouth daily.   metoprolol  succinate 100 MG 24 hr tablet Commonly known as: TOPROL -XL Take 100 mg by mouth daily after supper.   Multi-Vitamin tablet Take 1 tablet by mouth daily.   potassium chloride  10 MEQ tablet Commonly known as: KLOR-CON  Take 10 mEq by mouth daily after supper.        Follow-up Information     Fernande Ophelia PARAS III, MD. Schedule an appointment as soon as possible for a visit in 1 week(s).   Specialty: Internal Medicine Contact information: [address] [place] [address] [phone]                Discharge Exam: Allison Novak   12/15/23 1523 12/16/23 0645 12/17/23 0547  Weight: 59 kg 57.6 kg 55.1 kg   GENERAL:  89 y.o.-year-old patient with no acute distress.  LUNGS: Normal breath sounds bilaterally, no wheezing CARDIOVASCULAR: S1, S2 normal. No murmur   ABDOMEN: Soft, nontender, nondistended. Bowel sounds present.  EXTREMITIES: No  edema b/l.    NEUROLOGIC: nonfocal  patient is alert and awake  Condition at discharge: fair  The results of significant diagnostics from this hospitalization (including imaging, microbiology, ancillary and laboratory) are listed below for reference.   Imaging Studies: DG Chest Port 1 View Result Date: 12/15/2023 CLINICAL DATA:  COVID EXAM: PORTABLE CHEST 1 VIEW COMPARISON:  07/08/2023 FINDINGS: Interstitial thickening appears stable, likely chronic in nature. No pneumothorax. Small right pleural effusion, decreased in size since prior  examination. Stable cardiomegaly. Pulmonary vascularity is normal. No acute bone abnormality. IMPRESSION: 1. Small right pleural effusion, decreased in size since prior examination. 2. Stable cardiomegaly. Electronically Signed   By: Dorethia Molt M.D.   On: 12/15/2023 20:37   CT Head Wo Contrast Result Date: 12/15/2023 CLINICAL DATA:  Altered mental status EXAM: CT HEAD WITHOUT CONTRAST TECHNIQUE: Contiguous axial images were obtained from the base of the skull through the vertex without intravenous contrast. RADIATION DOSE REDUCTION: This exam was performed according to the departmental dose-optimization program which includes automated exposure control, adjustment of the mA and/or kV according to patient size and/or use of iterative reconstruction technique. COMPARISON:  07/04/2023 FINDINGS: Brain: Chronic small vessel disease throughout the deep white matter. Old left basal ganglia lacunar infarct is stable. No acute intracranial abnormality. Specifically, no hemorrhage, hydrocephalus, mass lesion, acute infarction, or significant intracranial injury. Vascular: No hyperdense vessel or unexpected calcification. Skull: No acute calvarial  abnormality. Sinuses/Orbits: No acute findings Other: None IMPRESSION: No acute intracranial abnormality. Electronically Signed   By: Franky Crease M.D.   On: 12/15/2023 15:57    Microbiology: Results for orders placed or performed during the hospital encounter of 12/15/23  Resp panel by RT-PCR (RSV, Flu A&B, Covid) Anterior Nasal Swab     Status: Abnormal   Collection Time: 12/15/23  3:25 PM   Specimen: Anterior Nasal Swab  Result Value Ref Range Status   SARS Coronavirus 2 by RT PCR POSITIVE (A) NEGATIVE Final    Comment: (NOTE) SARS-CoV-2 target nucleic acids are DETECTED.  The SARS-CoV-2 RNA is generally detectable in upper respiratory specimens during the acute phase of infection. Positive results are indicative of the presence of the identified virus, but do  not rule out bacterial infection or co-infection with other pathogens not detected by the test. Clinical correlation with patient history and other diagnostic information is necessary to determine patient infection status. The expected result is Negative.  Fact Sheet for Patients: BloggerCourse.com  Fact Sheet for Healthcare Providers: SeriousBroker.it  This test is not yet approved or cleared by the [place]  FDA and  has been authorized for detection and/or diagnosis of SARS-CoV-2 by FDA under an Emergency Use Authorization (EUA).  This EUA will remain in effect (meaning this test can be used) for the duration of  the COVID-19 declaration under Section 564(b)(1) of the A ct, 21 U.S.C. section 360bbb-3(b)(1), unless the authorization is terminated or revoked sooner.     Influenza A by PCR NEGATIVE NEGATIVE Final   Influenza B by PCR NEGATIVE NEGATIVE Final    Comment: (NOTE) The Xpert Xpress SARS-CoV-2/FLU/RSV plus assay is intended as an aid in the diagnosis of influenza from Nasopharyngeal swab specimens and should not be used as a sole basis for treatment. Nasal washings and aspirates are unacceptable for Xpert Xpress SARS-CoV-2/FLU/RSV testing.  Fact Sheet for Patients: BloggerCourse.com  Fact Sheet for Healthcare Providers: SeriousBroker.it  This test is not yet approved or cleared by the [place]  FDA and has been authorized for detection and/or diagnosis of SARS-CoV-2 by FDA under an Emergency Use Authorization (EUA). This EUA will remain in effect (meaning this test can be used) for the duration of the COVID-19 declaration under Section 564(b)(1) of the Act, 21 U.S.C. section 360bbb-3(b)(1), unless the authorization is terminated or revoked.     Resp Syncytial Virus by PCR NEGATIVE NEGATIVE Final    Comment: (NOTE) Fact Sheet for  Patients: BloggerCourse.com  Fact Sheet for Healthcare Providers: SeriousBroker.it  This test is not yet approved or cleared by the [place]  FDA and has been authorized for detection and/or diagnosis of SARS-CoV-2 by FDA under an Emergency Use Authorization (EUA). This EUA will remain in effect (meaning this test can be used) for the duration of the COVID-19 declaration under Section 564(b)(1) of the Act, 21 U.S.C. section 360bbb-3(b)(1), unless the authorization is terminated or revoked.  Performed at [facility], [address] [place], [place] [address]   Group A Strep by PCR [facility] Only)     Status: None   Collection Time: 12/15/23  3:25 PM   Specimen: Throat; Sterile Swab  Result Value Ref Range Status   Group A Strep by PCR NOT DETECTED NOT DETECTED Final    Comment: Performed at [facility], [address] [place], [place] [address]    Labs: CBC: Recent Labs  Lab 12/15/23 1525 12/16/23 0552  WBC 4.5 2.3*  HGB 13.1 12.9  HCT 40.1 40.1  MCV 92.0 92.4  PLT 115* 111*   Basic Metabolic Panel: Recent Labs  Lab 12/15/23 1525 12/16/23 0552  NA 133* 133*  K 4.0 3.8  CL 98 99  CO2 23 25  GLUCOSE 102* 113*  BUN 15 15  CREATININE 1.08* 0.85  CALCIUM  9.0 8.2*   Liver Function Tests: Recent Labs  Lab 12/15/23 1525  AST 38  ALT 21  ALKPHOS 84  BILITOT 0.7  PROT 7.3  ALBUMIN  3.8    Discharge time spent: greater than 30 minutes.  Signed: Leita Blanch, MD [facility] 12/17/2023

## 2023-12-25 NOTE — ED Provider Notes (Addendum)
 [facility] Emergency Department Provider Note   ____________________________________________    I have reviewed the triage vital signs and the nursing notes.   HISTORY  Chief Complaint Weakness     HPI Allison Novak is a 89 y.o. female with extensive past medical history detailed below who presents with complaints of weakness, possible fevers, significant fatigue and possible confusion.  Past Medical History:  Diagnosis Date   A-fib (HCC)    Anemia    Carotid artery stenosis    CHF (congestive heart failure) (HCC)    Chronic kidney disease    Coronary artery disease    Diverticulosis    GERD (gastroesophageal reflux disease)    Gout    Hiatal hernia    Hyperlipidemia    Hypertension    Osteoporosis    Pulmonary embolus [facility])     Patient Active Problem List   Diagnosis Date Noted   COVID-19 virus infection 12/15/2023   Syncope 07/05/2023   Paroxysmal atrial fibrillation (HCC) 07/05/2023   Chronic systolic CHF (congestive heart failure) (HCC) 07/05/2023   Weakness of left upper extremity 07/04/2023   CCC (chronic calculous cholecystitis) 10/20/2022   Cholecystostomy care (HCC) 09/13/2022   Cholecystitis 08/18/2022   Gout 06/01/2022   Sepsis due to pneumonia (HCC) 04/30/2022   Acute bronchitis due to respiratory syncytial virus (RSV) 04/30/2022   Aspiration pneumonia (HCC) 04/30/2022   CLL (chronic lymphocytic leukemia) (HCC) 01/18/2022   Iron  deficiency anemia 07/21/2021   Moderate pulmonary hypertension (HCC) 07/20/2021   Sepsis (HCC) 07/19/2021   Thrombocytopenia (HCC) 07/19/2021   Acute urinary retention 07/18/2021   History of pulmonary embolism 07/18/2021   Hypothermia 07/18/2021   Pleural effusion on right 07/18/2021   Chronic diastolic CHF (congestive heart failure) (HCC) 07/17/2021   HLD (hyperlipidemia) 07/17/2021   Stage 3a chronic kidney disease (CKD) (HCC) 07/17/2021   Acquired hypothyroidism 07/17/2021   Acute  respiratory failure with hypoxia (HCC) 07/17/2021   Acquired thrombophilia (HCC) 06/22/2021   Anemia of chronic disease 06/07/2021   Permanent atrial fibrillation (HCC) 06/07/2021   Hyponatremia 06/07/2021   Acute on chronic combined systolic and diastolic CHF (congestive heart failure) (HCC) 06/07/2021   Atrial fibrillation, chronic (HCC) 08/04/2020   Acute exacerbation of CHF (congestive heart failure) (HCC) 08/04/2020   Shortness of breath 08/03/2020   Hypokalemia    Palpitations    Hyperlipidemia 02/16/2020   Atrial fibrillation with RVR (HCC) 02/16/2020   Hypoxia 02/16/2020   Acute CHF (congestive heart failure) (HCC) 02/16/2020   Chronic anticoagulation 02/16/2020   Senile purpura (HCC) 11/21/2019   Nodule of upper lobe of left lung 10/30/2017   History of TIA (transient ischemic attack) 02/23/2017   Carotid artery disease (HCC) 02/23/2017   TIA (transient ischemic attack) 02/18/2017   Hiatal hernia with GERD without esophagitis 02/18/2017   History of nonmelanoma skin cancer 08/10/2015   Gallstones 06/30/2015   Chest pain 06/23/2015   Essential hypertension 06/16/2015   Aortic atherosclerosis (HCC) 06/16/2015   Diverticulosis of large intestine without hemorrhage 06/16/2015   Age-related osteoporosis without current pathological fracture 10/22/2013   Chronic ankle pain 06/28/2011    Past Surgical History:  Procedure Laterality Date   ABDOMINAL HYSTERECTOMY     ANKLE SURGERY Left 2011   IR CHOLANGIOGRAM EXISTING TUBE  09/20/2022   IR PERC CHOLECYSTOSTOMY  08/19/2022   IR RADIOLOGIST EVAL & MGMT  09/28/2022   KNEE ARTHROSCOPY Right     Prior to Admission medications   Medication Sig Start  Date End Date Taking? Authorizing Provider  acetaminophen  (TYLENOL ) 325 MG tablet Take 2 tablets (650 mg total) by mouth every 6 (six) hours as needed for mild pain (or Fever >/= 101). 08/23/22   Dorinda Drue DASEN, MD  albuterol  (VENTOLIN  HFA) 108 (90 Base) MCG/ACT inhaler Inhale 2 puffs  into the lungs every 6 (six) hours as needed for wheezing or shortness of breath. 04/26/22   [provider]  allopurinol  (ZYLOPRIM ) 100 MG tablet Take 100 mg by mouth daily.    [provider]  atorvastatin  (LIPITOR) 40 MG tablet Take 40 mg by mouth daily. 11/18/16   [provider]  calcium  carbonate (OSCAL) 1500 (600 Ca) MG TABS tablet Take 1 tablet by mouth in the morning and at bedtime.    [provider]  Cholecalciferol  50 MCG (2000 UT) CAPS Take 2,000 Units by mouth daily.    [provider]  digoxin  (LANOXIN ) 0.125 MG tablet Take 0.0625 mg by mouth daily. 08/27/21   [provider]  ELIQUIS  2.5 MG TABS tablet Take 2.5 mg by mouth 2 (two) times daily. 02/13/20   [provider]  esomeprazole (NEXIUM) 40 MG capsule Take 40 mg by mouth daily.    [provider]  levothyroxine  (SYNTHROID ) 25 MCG tablet Take 25 mcg by mouth daily. 07/14/21   [provider]  metoprolol  succinate (TOPROL -XL) 100 MG 24 hr tablet Take 100 mg by mouth daily after supper.    [provider]  Multiple Vitamin (MULTI-VITAMIN) tablet Take 1 tablet by mouth daily.    [provider]  polyvinyl alcohol  (LIQUIFILM TEARS) 1.4 % ophthalmic solution Place 2 drops into the left eye every 8 (eight) hours as needed for dry eyes. 07/26/21   [provider]  potassium chloride  (KLOR-CON ) 10 MEQ tablet Take 10 mEq by mouth daily after supper. 07/26/22   [provider]     Allergies Keflex  [cephalexin ] and Macrodantin [nitrofurantoin macrocrystal]  Family History  Problem Relation Age of Onset   CAD Mother    Hypertension Sister    COPD Brother     Social History Social History   Tobacco Use   Smoking status: Never    Passive exposure: Never   Smokeless tobacco: Never  Vaping Use   Vaping status: Never Used  Substance Use Topics   Alcohol  use: No    Alcohol /week: 0.0 standard drinks of alcohol    Drug  use: No       ____________________________________________   PHYSICAL EXAM:     Constitutional: Alert and appears normally oriented  Nose: No congestion/rhinnorhea. Mouth/Throat: Mucous membranes are moist.  Pharynx normal Neck:  Painless ROM Cardiovascular: Grossly normal heart sounds.  Good peripheral circulation. Respiratory: Clear to auscultation bilaterally Gastrointestinal: Soft and nontender.   Musculoskeletal:Warm and well perfused Neurologic:  Normal speech and language. No gross focal neurologic deficits are appreciated.  Skin:  Skin is warm, dry and intact. No rash noted.   ____________________________________________   LABS (all labs ordered are listed, but only abnormal results are displayed)  Labs Reviewed  RESP PANEL BY RT-PCR (RSV, FLU A&B, COVID)  RVPGX2 - Abnormal; Notable for the following components:      Result Value   SARS Coronavirus 2 by RT PCR POSITIVE (*)    All other components within normal limits  COMPREHENSIVE METABOLIC PANEL WITH GFR - Abnormal; Notable for the following components:   Sodium 133 (*)    Glucose, Bld 102 (*)    Creatinine, Ser 1.08 (*)  GFR, Estimated 47 (*)    All other components within normal limits  CBC - Abnormal; Notable for the following components:   RDW 18.2 (*)    Platelets 115 (*)    All other components within normal limits  URINALYSIS, ROUTINE W REFLEX MICROSCOPIC - Abnormal; Notable for the following components:   Color, Urine YELLOW (*)    APPearance CLEAR (*)    Ketones, ur 5 (*)    Protein, ur 30 (*)    Bacteria, UA RARE (*)    All other components within normal limits  BRAIN NATRIURETIC PEPTIDE - Abnormal; Notable for the following components:   B Natriuretic Peptide 360.4 (*)    All other components within normal limits  BASIC METABOLIC PANEL WITH GFR - Abnormal; Notable for the following components:   Sodium 133 (*)    Glucose, Bld 113 (*)    Calcium  8.2 (*)    All other components within  normal limits  CBC - Abnormal; Notable for the following components:   WBC 2.3 (*)    RDW 17.9 (*)    Platelets 111 (*)    All other components within normal limits  DIGOXIN  LEVEL - Abnormal; Notable for the following components:   Digoxin  Level 0.6 (*)    All other components within normal limits  GROUP A STREP BY PCR   ____________________________________________  EKG  ED ECG REPORT I, Lamar Price, the attending physician, personally viewed and interpreted this ECG.  Date: 12/25/2023  Rhythm: Atrial fibrillation QRS Axis: normal Intervals: Abnormal ST/T Wave abnormalities: Nonspecific changes Narrative Interpretation: no evidence of acute ischemia  ____________________________________________  RADIOLOGY  CT head without acute normality,  Chest x-ray with small pleural effusion ____________________________________________   PROCEDURES  Procedure(s) performed: No  Procedures   Critical Care performed: No ____________________________________________   INITIAL IMPRESSION / ASSESSMENT AND PLAN / ED COURSE  Pertinent labs & imaging results that were available during my care of the patient were reviewed by me and considered in my medical decision making (see chart for details).   Patient presents with generalized weakness, subjective fevers reported, fatigue some reports of confusion.  However here she seems to be alert and oriented.  Lab work reviewed and is generally reassuring, CT head without acute abnormality, chest x-ray with small pleural effusion  However COVID test is positive, this is likely the cause of her symptoms, given her severe fatigue and weakness she will require admission to the hospitalist, I have consulted with the hospitalist team for admission    ____________________________________________   FINAL CLINICAL IMPRESSION(S) / ED DIAGNOSES  Final diagnoses:  COVID-19        Note:  This document was prepared using Dragon voice  recognition software and may include unintentional dictation errors.    Price Lamar, MD 12/25/23 [id number]    Price Lamar, MD 12/25/23 [phone]

## 2024-06-14 ENCOUNTER — Emergency Department

## 2024-06-14 ENCOUNTER — Other Ambulatory Visit: Payer: Self-pay

## 2024-06-14 ENCOUNTER — Encounter: Payer: Self-pay | Admitting: Intensive Care

## 2024-06-14 ENCOUNTER — Emergency Department
Admission: EM | Admit: 2024-06-14 | Discharge: 2024-06-14 | Disposition: A | Discharge status: Home / Self Care | Source: Home / Self Care | Attending: Emergency Medicine | Admitting: Emergency Medicine

## 2024-06-14 DIAGNOSIS — R531 Weakness: Secondary | ICD-10-CM

## 2024-06-14 LAB — DIFFERENTIAL
Abs Immature Granulocytes: 0.01 10*3/uL (ref 0.00–0.07)
Basophils Absolute: 0.1 10*3/uL (ref 0.0–0.1)
Basophils Relative: 1 %
Eosinophils Absolute: 0.1 10*3/uL (ref 0.0–0.5)
Eosinophils Relative: 2 %
Immature Granulocytes: 0 %
Lymphocytes Relative: 22 %
Lymphs Abs: 1.6 10*3/uL (ref 0.7–4.0)
Monocytes Absolute: 1.2 10*3/uL — ABNORMAL HIGH (ref 0.1–1.0)
Monocytes Relative: 17 %
Neutro Abs: 4.1 10*3/uL (ref 1.7–7.7)
Neutrophils Relative %: 58 %

## 2024-06-14 LAB — COMPREHENSIVE METABOLIC PANEL WITH GFR
ALT: 15 U/L (ref 0–44)
AST: 27 U/L (ref 15–41)
Albumin: 4 g/dL (ref 3.5–5.0)
Alkaline Phosphatase: 96 U/L (ref 38–126)
Anion gap: 10 (ref 5–15)
BUN: 23 mg/dL (ref 8–23)
CO2: 28 mmol/L (ref 22–32)
Calcium: 9.3 mg/dL (ref 8.9–10.3)
Chloride: 94 mmol/L — ABNORMAL LOW (ref 98–111)
Creatinine, Ser: 0.99 mg/dL (ref 0.44–1.00)
GFR, Estimated: 52 mL/min — ABNORMAL LOW
Glucose, Bld: 80 mg/dL (ref 70–99)
Potassium: 4.4 mmol/L (ref 3.5–5.1)
Sodium: 131 mmol/L — ABNORMAL LOW (ref 135–145)
Total Bilirubin: 0.6 mg/dL (ref 0.0–1.2)
Total Protein: 6.6 g/dL (ref 6.5–8.1)

## 2024-06-14 LAB — URINALYSIS, ROUTINE W REFLEX MICROSCOPIC
Bacteria, UA: NONE SEEN
Bilirubin Urine: NEGATIVE
Glucose, UA: NEGATIVE mg/dL
Hgb urine dipstick: NEGATIVE
Ketones, ur: NEGATIVE mg/dL
Nitrite: NEGATIVE
Protein, ur: NEGATIVE mg/dL
RBC / HPF: 0 RBC/hpf (ref 0–5)
Specific Gravity, Urine: 1.014 (ref 1.005–1.030)
Squamous Epithelial / HPF: 0 /HPF (ref 0–5)
pH: 6 (ref 5.0–8.0)

## 2024-06-14 LAB — CBC
HCT: 37.2 % (ref 36.0–46.0)
Hemoglobin: 12.6 g/dL (ref 12.0–15.0)
MCH: 32.3 pg (ref 26.0–34.0)
MCHC: 33.9 g/dL (ref 30.0–36.0)
MCV: 95.4 fL (ref 80.0–100.0)
Platelets: 160 10*3/uL (ref 150–400)
RBC: 3.9 MIL/uL (ref 3.87–5.11)
RDW: 14.1 % (ref 11.5–15.5)
WBC: 7 10*3/uL (ref 4.0–10.5)
nRBC: 0 % (ref 0.0–0.2)

## 2024-06-14 LAB — APTT: aPTT: 44 s — ABNORMAL HIGH (ref 24–36)

## 2024-06-14 LAB — PROTIME-INR
INR: 1.2 (ref 0.8–1.2)
Prothrombin Time: 16.4 s — ABNORMAL HIGH (ref 11.4–15.2)

## 2024-06-14 LAB — ETHANOL: Alcohol, Ethyl (B): <15 mg/dL

## 2024-06-14 MED ORDER — SODIUM CHLORIDE 0.9 % IV BOLUS
500.0000 mL | Freq: Once | INTRAVENOUS | Status: AC
  Administered 2024-06-14: 500 mL via INTRAVENOUS

## 2024-06-14 NOTE — ED Notes (Signed)
 Fall bundle In place

## 2024-06-14 NOTE — ED Notes (Addendum)
 Patient Decon complete

## 2024-06-14 NOTE — ED Notes (Signed)
 Bed bug found on patient during IV insertion. Charge nurse aware.

## 2024-06-14 NOTE — ED Triage Notes (Signed)
 Patient brought over from [name]. KC reports lethargic in office, AMS, favoring right side of body and KNW X2 weeks.   Patient reports her right eye vision is worse than normal. Started over a week ago   Denies pain

## 2024-06-14 NOTE — ED Provider Notes (Signed)
 "  [facility] Provider Note    Event Date/Time   First MD Initiated Contact with Patient 06/14/24 1541     (approximate)   History   Weakness and Altered Mental Status   HPI  Allison Novak is a 89 y.o. female who presents to the emergency department today accompanied by family because of concerns for weakness, disorientation and some balance issues.  Family states that they have noticed this over the past couple of days.  Patient seems to be slower in her daily activities than normal.  Additionally they noticed that she was having some difficulty walking.  They have not appreciated any fevers.  The patient herself has just said that she has felt unwell.  Does have a history of gallbladder disease although has not had any abdominal pain nausea or vomiting. Family thought they felt some rattling in her chest.      Physical Exam   Triage Vital Signs: ED Triage Vitals  Encounter Vitals Group     BP 06/14/24 1240 (!) 143/70     Girls Systolic BP Percentile --      Girls Diastolic BP Percentile --      Boys Systolic BP Percentile --      Boys Diastolic BP Percentile --      Pulse Rate 06/14/24 1240 63     Resp 06/14/24 1240 15     Temp 06/14/24 1240 98.5 F (36.9 C)     Temp Source 06/14/24 1240 Oral     SpO2 06/14/24 1240 98 %     Weight 06/14/24 1237 115 lb (52.2 kg)     Height 06/14/24 1237 5' 1 (1.549 m)     Head Circumference --      Peak Flow --      Pain Score 06/14/24 1237 0     Pain Loc --      Pain Education --      Exclude from Growth Chart --     Most recent vital signs: Vitals:   06/14/24 1240  BP: (!) 143/70  Pulse: 63  Resp: 15  Temp: 98.5 F (36.9 C)  SpO2: 98%   General: Awake, alert CV:  Good peripheral perfusion. Regular rate and rhythm. Resp:  Normal effort. Lungs clear. Abd:  No distention. Non tender.  ED Results / Procedures / Treatments   Labs (all labs ordered are listed, but only abnormal results are  displayed) Labs Reviewed  PROTIME-INR - Abnormal; Notable for the following components:      Result Value   Prothrombin Time 16.4 (*)    All other components within normal limits  APTT - Abnormal; Notable for the following components:   aPTT 44 (*)    All other components within normal limits  DIFFERENTIAL - Abnormal; Notable for the following components:   Monocytes Absolute 1.2 (*)    All other components within normal limits  COMPREHENSIVE METABOLIC PANEL WITH GFR - Abnormal; Notable for the following components:   Sodium 131 (*)    Chloride 94 (*)    GFR, Estimated 52 (*)    All other components within normal limits  URINALYSIS, ROUTINE W REFLEX MICROSCOPIC - Abnormal; Notable for the following components:   Color, Urine YELLOW (*)    APPearance HAZY (*)    Leukocytes,Ua SMALL (*)    All other components within normal limits  URINE CULTURE  CBC  ETHANOL  CBG MONITORING, ED     EKG  I, Guadalupe Eagles, attending  physician, personally viewed and interpreted this EKG  EKG Time: 1239 Rate: 63 Rhythm: atrial fibrillation Axis: left axis deviation Intervals: qtc 415 QRS: incomplete RBBB ST changes: no st elevation Impression: abnormal ekg    RADIOLOGY I independently interpreted and visualized the CT head. My interpretation: No ICH Radiology interpretation:  IMPRESSION:  1. No acute intracranial abnormality.  2. Remote lacunar infarcts in the left basal ganglia.  3. Moderate chronic microvascular ischemic changes and mild cerebral volume  loss for age.   I independently interpreted and visualized the RUQ US . My interpretation: gallstones Radiology interpretation:  IMPRESSION:  1. Multiple small gallstones with mild gallbladder wall thickening. In the  absence of a sonographic murphy sign and pericholecystic fluid, the study is  equivocal for acute cholecystitis. If there is further clinical concern for  acute cholecystitis, a nuclear medicine hepatobiliary  scan would be  recommended.  2. Right pleural effusion.    I independently interpreted and visualized the CXR. My interpretation: No pneumonia Radiology interpretation:  IMPRESSION:  Stable reticular densities are noted throughout both lungs  concerning for scarring. Minimal right basilar subsegmental  atelectasis or scarring.   I independently interpreted and visualized the MR brain. My interpretation: No large mass Radiology interpretation:  IMPRESSION:  1. No acute abnormality.  2. Moderate to advanced chronic microvascular ischemic change.       PROCEDURES:  Critical Care performed: No    MEDICATIONS ORDERED IN ED: Medications - No data to display   IMPRESSION / MDM / ASSESSMENT AND PLAN / ED COURSE  I reviewed the triage vital signs and the nursing notes.                              Differential diagnosis includes, but is not limited to, infection, anemia, electrolyte abnormality, CVA  Patient's presentation is most consistent with acute presentation with potential threat to life or bodily function.   Patient presented to the emergency department today because of concerns for weakness and some confusion.  On exam patient is awake alert and oriented.  Broad workup was initiated.  Imaging of the brain did not show any acute abnormality.  UA shows some white blood cells but no bacteria.  Will send this for culture.  Blood work without concerning leukocytosis anemia or electrolyte abnormality.  Did obtain a right upper quadrant ultrasound given the patient has history of gallstones.  No pericholecystic fluid.  Did have some mild gallbladder wall thickening.  However patient is nontender in the right upper quadrant and does not have elevated white count and blood work.  At this time I would have extremely low suspicion the gallbladder is playing a role in the patient's symptoms.  At this time is somewhat unclear etiology of the weakness however given reassuring workup I do  think it is reasonable and safe for patient to be discharged.  Did encourage close follow-up with primary care.SABRA      FINAL CLINICAL IMPRESSION(S) / ED DIAGNOSES   Final diagnoses:  Weakness      Note:  This document was prepared using Dragon voice recognition software and may include unintentional dictation errors.    Floy Roberts, MD 06/14/24 2152  "
